# Patient Record
Sex: Male | Born: 1955
Health system: Southern US, Community
[De-identification: ages and names within clinical notes are randomized; demographics above are authoritative.]

## PROBLEM LIST (undated history)

## (undated) DIAGNOSIS — Z8719 Personal history of other diseases of the digestive system: Secondary | ICD-10-CM

## (undated) DIAGNOSIS — F1021 Alcohol dependence, in remission: Secondary | ICD-10-CM

## (undated) DIAGNOSIS — I442 Atrioventricular block, complete: Secondary | ICD-10-CM

## (undated) DIAGNOSIS — I2510 Atherosclerotic heart disease of native coronary artery without angina pectoris: Secondary | ICD-10-CM

## (undated) DIAGNOSIS — E119 Type 2 diabetes mellitus without complications: Secondary | ICD-10-CM

## (undated) DIAGNOSIS — E1140 Type 2 diabetes mellitus with diabetic neuropathy, unspecified: Secondary | ICD-10-CM

## (undated) DIAGNOSIS — Z95810 Presence of automatic (implantable) cardiac defibrillator: Secondary | ICD-10-CM

## (undated) DIAGNOSIS — M1990 Unspecified osteoarthritis, unspecified site: Secondary | ICD-10-CM

## (undated) DIAGNOSIS — G5600 Carpal tunnel syndrome, unspecified upper limb: Secondary | ICD-10-CM

## (undated) DIAGNOSIS — I429 Cardiomyopathy, unspecified: Secondary | ICD-10-CM

## (undated) DIAGNOSIS — I219 Acute myocardial infarction, unspecified: Secondary | ICD-10-CM

## (undated) DIAGNOSIS — I10 Essential (primary) hypertension: Secondary | ICD-10-CM

## (undated) DIAGNOSIS — I480 Paroxysmal atrial fibrillation: Secondary | ICD-10-CM

## (undated) DIAGNOSIS — I5022 Chronic systolic (congestive) heart failure: Secondary | ICD-10-CM

## (undated) DIAGNOSIS — Z8701 Personal history of pneumonia (recurrent): Secondary | ICD-10-CM

## (undated) DIAGNOSIS — E785 Hyperlipidemia, unspecified: Secondary | ICD-10-CM

## (undated) DIAGNOSIS — C189 Malignant neoplasm of colon, unspecified: Secondary | ICD-10-CM

## (undated) DIAGNOSIS — J449 Chronic obstructive pulmonary disease, unspecified: Secondary | ICD-10-CM

## (undated) HISTORY — DX: Atrioventricular block, complete: I44.2

## (undated) HISTORY — DX: Hyperlipidemia, unspecified: E78.5

## (undated) HISTORY — DX: Carpal tunnel syndrome, unspecified upper limb: G56.00

## (undated) HISTORY — PX: ANKLE SURGERY: SHX546

## (undated) HISTORY — DX: Type 2 diabetes mellitus without complications: E11.9

## (undated) HISTORY — DX: Chronic systolic (congestive) heart failure: I50.22

## (undated) HISTORY — PX: CORONARY ANGIOPLASTY WITH STENT PLACEMENT: SHX49

## (undated) HISTORY — DX: Alcohol dependence, in remission: F10.21

## (undated) HISTORY — PX: INGUINAL HERNIA REPAIR: SHX194

## (undated) HISTORY — DX: Malignant neoplasm of colon, unspecified: C18.9

---

## 2001-11-29 ENCOUNTER — Encounter: Payer: Self-pay | Admitting: General Surgery

## 2001-11-30 ENCOUNTER — Inpatient Hospital Stay (HOSPITAL_COMMUNITY): Admission: RE | Admit: 2001-11-30 | Discharge: 2001-12-02 | Payer: Self-pay | Admitting: General Surgery

## 2004-10-04 DIAGNOSIS — C189 Malignant neoplasm of colon, unspecified: Secondary | ICD-10-CM

## 2004-10-04 HISTORY — DX: Malignant neoplasm of colon, unspecified: C18.9

## 2005-02-10 ENCOUNTER — Encounter: Payer: Self-pay | Admitting: Emergency Medicine

## 2005-02-10 ENCOUNTER — Ambulatory Visit: Payer: Self-pay | Admitting: Cardiology

## 2005-02-10 ENCOUNTER — Inpatient Hospital Stay (HOSPITAL_COMMUNITY): Admission: AD | Admit: 2005-02-10 | Discharge: 2005-02-23 | Payer: Self-pay | Admitting: Cardiology

## 2005-02-11 ENCOUNTER — Ambulatory Visit: Payer: Self-pay | Admitting: Oncology

## 2005-02-11 ENCOUNTER — Encounter: Payer: Self-pay | Admitting: Cardiology

## 2005-02-25 ENCOUNTER — Ambulatory Visit: Payer: Self-pay | Admitting: *Deleted

## 2005-02-26 ENCOUNTER — Ambulatory Visit: Payer: Self-pay | Admitting: *Deleted

## 2005-03-02 ENCOUNTER — Ambulatory Visit: Payer: Self-pay | Admitting: *Deleted

## 2005-03-03 ENCOUNTER — Ambulatory Visit: Payer: Self-pay | Admitting: *Deleted

## 2005-03-11 ENCOUNTER — Ambulatory Visit: Payer: Self-pay | Admitting: *Deleted

## 2005-03-30 ENCOUNTER — Encounter: Admission: RE | Admit: 2005-03-30 | Discharge: 2005-03-30 | Payer: Self-pay | Admitting: Oncology

## 2005-03-30 ENCOUNTER — Ambulatory Visit (HOSPITAL_COMMUNITY): Payer: Self-pay | Admitting: Oncology

## 2005-03-30 ENCOUNTER — Encounter (HOSPITAL_COMMUNITY): Admission: RE | Admit: 2005-03-30 | Discharge: 2005-04-29 | Payer: Self-pay | Admitting: Oncology

## 2005-04-16 ENCOUNTER — Ambulatory Visit: Payer: Self-pay | Admitting: Cardiology

## 2005-04-16 ENCOUNTER — Ambulatory Visit (HOSPITAL_COMMUNITY): Admission: RE | Admit: 2005-04-16 | Discharge: 2005-04-16 | Payer: Self-pay | Admitting: Pulmonary Disease

## 2005-04-21 ENCOUNTER — Ambulatory Visit: Payer: Self-pay | Admitting: Physician Assistant

## 2005-04-22 ENCOUNTER — Ambulatory Visit: Payer: Self-pay | Admitting: Cardiology

## 2005-04-22 ENCOUNTER — Ambulatory Visit (HOSPITAL_COMMUNITY): Admission: RE | Admit: 2005-04-22 | Discharge: 2005-04-22 | Payer: Self-pay | Admitting: Pulmonary Disease

## 2005-04-27 ENCOUNTER — Ambulatory Visit: Payer: Self-pay | Admitting: *Deleted

## 2005-05-04 ENCOUNTER — Ambulatory Visit: Payer: Self-pay | Admitting: *Deleted

## 2005-05-05 ENCOUNTER — Encounter (HOSPITAL_COMMUNITY): Admission: RE | Admit: 2005-05-05 | Discharge: 2005-06-04 | Payer: Self-pay | Admitting: *Deleted

## 2005-05-19 ENCOUNTER — Ambulatory Visit: Payer: Self-pay | Admitting: *Deleted

## 2005-06-01 ENCOUNTER — Ambulatory Visit (HOSPITAL_COMMUNITY): Payer: Self-pay | Admitting: Oncology

## 2005-06-01 ENCOUNTER — Encounter (HOSPITAL_COMMUNITY): Admission: RE | Admit: 2005-06-01 | Discharge: 2005-07-01 | Payer: Self-pay | Admitting: Oncology

## 2005-06-01 ENCOUNTER — Encounter: Admission: RE | Admit: 2005-06-01 | Discharge: 2005-06-01 | Payer: Self-pay | Admitting: Oncology

## 2005-06-09 ENCOUNTER — Encounter (HOSPITAL_COMMUNITY): Admission: RE | Admit: 2005-06-09 | Discharge: 2005-07-02 | Payer: Self-pay | Admitting: *Deleted

## 2005-06-14 ENCOUNTER — Ambulatory Visit (HOSPITAL_COMMUNITY): Admission: RE | Admit: 2005-06-14 | Discharge: 2005-06-14 | Payer: Self-pay | Admitting: Pulmonary Disease

## 2005-06-16 ENCOUNTER — Ambulatory Visit: Payer: Self-pay | Admitting: *Deleted

## 2005-07-14 ENCOUNTER — Ambulatory Visit: Payer: Self-pay | Admitting: *Deleted

## 2005-07-27 ENCOUNTER — Ambulatory Visit (HOSPITAL_COMMUNITY): Payer: Self-pay | Admitting: Oncology

## 2005-07-27 ENCOUNTER — Encounter (HOSPITAL_COMMUNITY): Admission: RE | Admit: 2005-07-27 | Discharge: 2005-08-26 | Payer: Self-pay | Admitting: Oncology

## 2005-07-27 ENCOUNTER — Encounter: Admission: RE | Admit: 2005-07-27 | Discharge: 2005-07-27 | Payer: Self-pay | Admitting: Oncology

## 2005-08-14 ENCOUNTER — Ambulatory Visit: Payer: Self-pay | Admitting: Internal Medicine

## 2005-08-17 ENCOUNTER — Ambulatory Visit (HOSPITAL_COMMUNITY): Admission: RE | Admit: 2005-08-17 | Discharge: 2005-08-17 | Payer: Self-pay | Admitting: Internal Medicine

## 2005-08-17 ENCOUNTER — Encounter: Payer: Self-pay | Admitting: Internal Medicine

## 2005-08-17 HISTORY — PX: COLONOSCOPY: SHX174

## 2005-08-19 ENCOUNTER — Ambulatory Visit: Payer: Self-pay | Admitting: *Deleted

## 2005-09-01 ENCOUNTER — Encounter (INDEPENDENT_AMBULATORY_CARE_PROVIDER_SITE_OTHER): Payer: Self-pay | Admitting: General Surgery

## 2005-09-01 ENCOUNTER — Inpatient Hospital Stay (HOSPITAL_COMMUNITY): Admission: RE | Admit: 2005-09-01 | Discharge: 2005-09-05 | Payer: Self-pay | Admitting: General Surgery

## 2005-09-01 HISTORY — PX: SUBTOTAL COLECTOMY: SHX855

## 2005-09-02 ENCOUNTER — Ambulatory Visit: Payer: Self-pay | Admitting: *Deleted

## 2005-09-14 ENCOUNTER — Encounter (HOSPITAL_COMMUNITY): Admission: RE | Admit: 2005-09-14 | Discharge: 2005-09-14 | Payer: Self-pay | Admitting: Oncology

## 2005-09-14 ENCOUNTER — Encounter: Admission: RE | Admit: 2005-09-14 | Discharge: 2005-09-14 | Payer: Self-pay | Admitting: Oncology

## 2005-09-14 ENCOUNTER — Ambulatory Visit (HOSPITAL_COMMUNITY): Payer: Self-pay | Admitting: Oncology

## 2005-09-17 ENCOUNTER — Ambulatory Visit: Payer: Self-pay | Admitting: *Deleted

## 2005-11-09 ENCOUNTER — Encounter (HOSPITAL_COMMUNITY): Admission: RE | Admit: 2005-11-09 | Discharge: 2005-12-09 | Payer: Self-pay | Admitting: Oncology

## 2005-11-09 ENCOUNTER — Encounter: Admission: RE | Admit: 2005-11-09 | Discharge: 2005-11-09 | Payer: Self-pay | Admitting: Oncology

## 2005-11-15 ENCOUNTER — Ambulatory Visit: Payer: Self-pay | Admitting: *Deleted

## 2005-11-16 ENCOUNTER — Ambulatory Visit (HOSPITAL_COMMUNITY): Admission: RE | Admit: 2005-11-16 | Discharge: 2005-11-16 | Payer: Self-pay | Admitting: *Deleted

## 2005-11-16 ENCOUNTER — Ambulatory Visit: Payer: Self-pay | Admitting: *Deleted

## 2005-11-23 ENCOUNTER — Ambulatory Visit: Payer: Self-pay | Admitting: *Deleted

## 2005-11-24 ENCOUNTER — Ambulatory Visit (HOSPITAL_COMMUNITY): Payer: Self-pay | Admitting: Oncology

## 2005-11-30 ENCOUNTER — Ambulatory Visit (HOSPITAL_COMMUNITY): Admission: RE | Admit: 2005-11-30 | Discharge: 2005-12-01 | Payer: Self-pay | Admitting: Cardiology

## 2005-11-30 ENCOUNTER — Ambulatory Visit: Payer: Self-pay | Admitting: Cardiology

## 2005-12-16 ENCOUNTER — Ambulatory Visit: Payer: Self-pay | Admitting: Cardiology

## 2005-12-21 ENCOUNTER — Encounter: Admission: RE | Admit: 2005-12-21 | Discharge: 2005-12-21 | Payer: Self-pay | Admitting: Oncology

## 2005-12-21 ENCOUNTER — Encounter (HOSPITAL_COMMUNITY): Admission: RE | Admit: 2005-12-21 | Discharge: 2006-01-20 | Payer: Self-pay | Admitting: Oncology

## 2006-01-18 ENCOUNTER — Ambulatory Visit (HOSPITAL_COMMUNITY): Payer: Self-pay | Admitting: Oncology

## 2006-02-01 ENCOUNTER — Ambulatory Visit: Payer: Self-pay | Admitting: *Deleted

## 2006-04-08 ENCOUNTER — Encounter: Admission: RE | Admit: 2006-04-08 | Discharge: 2006-04-08 | Payer: Self-pay | Admitting: Oncology

## 2006-04-08 ENCOUNTER — Ambulatory Visit (HOSPITAL_COMMUNITY): Payer: Self-pay | Admitting: Oncology

## 2006-04-08 ENCOUNTER — Encounter (HOSPITAL_COMMUNITY): Admission: RE | Admit: 2006-04-08 | Discharge: 2006-05-08 | Payer: Self-pay | Admitting: Oncology

## 2006-05-05 ENCOUNTER — Ambulatory Visit: Payer: Self-pay | Admitting: *Deleted

## 2006-05-13 ENCOUNTER — Emergency Department (HOSPITAL_COMMUNITY): Admission: EM | Admit: 2006-05-13 | Discharge: 2006-05-13 | Payer: Self-pay | Admitting: Emergency Medicine

## 2006-05-31 ENCOUNTER — Ambulatory Visit: Payer: Self-pay | Admitting: *Deleted

## 2006-06-07 ENCOUNTER — Ambulatory Visit: Payer: Self-pay | Admitting: Cardiology

## 2006-06-07 ENCOUNTER — Encounter (HOSPITAL_COMMUNITY): Admission: RE | Admit: 2006-06-07 | Discharge: 2006-07-02 | Payer: Self-pay | Admitting: *Deleted

## 2006-06-13 ENCOUNTER — Ambulatory Visit: Payer: Self-pay | Admitting: Internal Medicine

## 2006-06-13 ENCOUNTER — Encounter (INDEPENDENT_AMBULATORY_CARE_PROVIDER_SITE_OTHER): Payer: Self-pay | Admitting: *Deleted

## 2006-06-13 ENCOUNTER — Ambulatory Visit (HOSPITAL_COMMUNITY): Admission: RE | Admit: 2006-06-13 | Discharge: 2006-06-13 | Payer: Self-pay | Admitting: Internal Medicine

## 2006-06-13 HISTORY — PX: FLEXIBLE SIGMOIDOSCOPY: SHX1649

## 2006-06-15 ENCOUNTER — Ambulatory Visit: Payer: Self-pay | Admitting: Cardiology

## 2006-07-15 ENCOUNTER — Encounter: Admission: RE | Admit: 2006-07-15 | Discharge: 2006-07-15 | Payer: Self-pay | Admitting: Oncology

## 2006-07-15 ENCOUNTER — Encounter (HOSPITAL_COMMUNITY): Admission: RE | Admit: 2006-07-15 | Discharge: 2006-08-14 | Payer: Self-pay | Admitting: Oncology

## 2006-07-15 ENCOUNTER — Ambulatory Visit (HOSPITAL_COMMUNITY): Payer: Self-pay | Admitting: Oncology

## 2006-08-02 ENCOUNTER — Encounter: Admission: EM | Admit: 2006-08-02 | Discharge: 2006-08-02 | Payer: Self-pay | Admitting: Dentistry

## 2006-08-02 ENCOUNTER — Ambulatory Visit: Payer: Self-pay | Admitting: Dentistry

## 2006-08-19 ENCOUNTER — Ambulatory Visit: Payer: Self-pay | Admitting: Cardiology

## 2006-09-12 ENCOUNTER — Ambulatory Visit: Payer: Self-pay | Admitting: Internal Medicine

## 2006-10-11 ENCOUNTER — Ambulatory Visit (HOSPITAL_COMMUNITY): Payer: Self-pay | Admitting: Oncology

## 2006-10-11 ENCOUNTER — Encounter (HOSPITAL_COMMUNITY): Admission: RE | Admit: 2006-10-11 | Discharge: 2006-11-10 | Payer: Self-pay | Admitting: Oncology

## 2007-03-13 ENCOUNTER — Ambulatory Visit (HOSPITAL_COMMUNITY): Payer: Self-pay | Admitting: Oncology

## 2007-03-13 ENCOUNTER — Encounter (HOSPITAL_COMMUNITY): Admission: RE | Admit: 2007-03-13 | Discharge: 2007-04-12 | Payer: Self-pay | Admitting: Oncology

## 2007-11-10 ENCOUNTER — Ambulatory Visit (HOSPITAL_COMMUNITY): Admission: RE | Admit: 2007-11-10 | Discharge: 2007-11-10 | Payer: Self-pay | Admitting: Pulmonary Disease

## 2007-11-17 ENCOUNTER — Ambulatory Visit: Payer: Self-pay | Admitting: Cardiology

## 2007-11-17 ENCOUNTER — Ambulatory Visit (HOSPITAL_COMMUNITY): Admission: RE | Admit: 2007-11-17 | Discharge: 2007-11-17 | Payer: Self-pay | Admitting: Pulmonary Disease

## 2008-11-09 ENCOUNTER — Emergency Department (HOSPITAL_COMMUNITY): Admission: EM | Admit: 2008-11-09 | Discharge: 2008-11-09 | Payer: Self-pay | Admitting: Emergency Medicine

## 2008-11-11 ENCOUNTER — Ambulatory Visit: Payer: Self-pay | Admitting: Orthopedic Surgery

## 2008-11-11 DIAGNOSIS — S52539A Colles' fracture of unspecified radius, initial encounter for closed fracture: Secondary | ICD-10-CM | POA: Insufficient documentation

## 2008-12-23 ENCOUNTER — Ambulatory Visit: Payer: Self-pay | Admitting: Orthopedic Surgery

## 2009-01-23 ENCOUNTER — Inpatient Hospital Stay (HOSPITAL_COMMUNITY): Admission: EM | Admit: 2009-01-23 | Discharge: 2009-01-25 | Payer: Self-pay | Admitting: Emergency Medicine

## 2010-03-26 ENCOUNTER — Inpatient Hospital Stay (HOSPITAL_COMMUNITY): Admission: EM | Admit: 2010-03-26 | Discharge: 2010-03-31 | Payer: Self-pay | Admitting: Emergency Medicine

## 2010-03-26 ENCOUNTER — Ambulatory Visit: Payer: Self-pay | Admitting: Pulmonary Disease

## 2010-03-26 ENCOUNTER — Encounter: Payer: Self-pay | Admitting: Emergency Medicine

## 2010-03-28 ENCOUNTER — Encounter (INDEPENDENT_AMBULATORY_CARE_PROVIDER_SITE_OTHER): Payer: Self-pay | Admitting: Cardiology

## 2010-11-26 ENCOUNTER — Emergency Department (HOSPITAL_COMMUNITY)
Admission: EM | Admit: 2010-11-26 | Discharge: 2010-11-26 | Disposition: A | Discharge status: Home / Self Care | Payer: Medicare Other | Attending: Emergency Medicine | Admitting: Emergency Medicine

## 2010-11-26 DIAGNOSIS — L03119 Cellulitis of unspecified part of limb: Secondary | ICD-10-CM | POA: Insufficient documentation

## 2010-11-26 DIAGNOSIS — I2510 Atherosclerotic heart disease of native coronary artery without angina pectoris: Secondary | ICD-10-CM | POA: Insufficient documentation

## 2010-11-26 DIAGNOSIS — I10 Essential (primary) hypertension: Secondary | ICD-10-CM | POA: Insufficient documentation

## 2010-11-26 DIAGNOSIS — E119 Type 2 diabetes mellitus without complications: Secondary | ICD-10-CM | POA: Insufficient documentation

## 2010-11-26 DIAGNOSIS — J4489 Other specified chronic obstructive pulmonary disease: Secondary | ICD-10-CM | POA: Insufficient documentation

## 2010-11-26 DIAGNOSIS — J449 Chronic obstructive pulmonary disease, unspecified: Secondary | ICD-10-CM | POA: Insufficient documentation

## 2010-11-26 DIAGNOSIS — L02419 Cutaneous abscess of limb, unspecified: Secondary | ICD-10-CM | POA: Insufficient documentation

## 2010-11-26 DIAGNOSIS — Z79899 Other long term (current) drug therapy: Secondary | ICD-10-CM | POA: Insufficient documentation

## 2010-11-26 DIAGNOSIS — Z794 Long term (current) use of insulin: Secondary | ICD-10-CM | POA: Insufficient documentation

## 2010-11-26 DIAGNOSIS — M79609 Pain in unspecified limb: Secondary | ICD-10-CM | POA: Insufficient documentation

## 2010-11-26 LAB — BASIC METABOLIC PANEL WITH GFR
BUN: 21 mg/dL (ref 6–23)
CO2: 26 meq/L (ref 19–32)
Calcium: 8.9 mg/dL (ref 8.4–10.5)
Chloride: 95 meq/L — ABNORMAL LOW (ref 96–112)
Creatinine, Ser: 1.4 mg/dL (ref 0.4–1.5)
GFR calc Af Amer: >60 mL/min (ref >60)
GFR calc non Af Amer: 53 mL/min — ABNORMAL LOW (ref >60)
Glucose, Bld: 174 mg/dL — ABNORMAL HIGH (ref 70–99)
Potassium: 4.6 meq/L (ref 3.5–5.1)
Sodium: 131 meq/L — ABNORMAL LOW (ref 135–145)

## 2010-11-26 LAB — CBC
MCH: 28.9 pg (ref 26.0–34.0)
MCV: 86.7 fL (ref 78.0–100.0)
RDW: 13.6 % (ref 11.5–15.5)
WBC: 15.9 10*3/uL — ABNORMAL HIGH (ref 4.0–10.5)

## 2010-11-26 LAB — DIFFERENTIAL
Eosinophils Relative: 1 % (ref 0–5)
Monocytes Relative: 8 % (ref 3–12)
Neutro Abs: 12.9 10*3/uL — ABNORMAL HIGH (ref 1.7–7.7)

## 2010-12-20 LAB — BASIC METABOLIC PANEL
BUN: 28 mg/dL — ABNORMAL HIGH (ref 6–23)
BUN: 5 mg/dL — ABNORMAL LOW (ref 6–23)
BUN: 5 mg/dL — ABNORMAL LOW (ref 6–23)
CO2: 26 mEq/L (ref 19–32)
Calcium: 8.5 mg/dL (ref 8.4–10.5)
Calcium: 9 mg/dL (ref 8.4–10.5)
Chloride: 103 mEq/L (ref 96–112)
Chloride: 108 mEq/L (ref 96–112)
Chloride: 113 mEq/L — ABNORMAL HIGH (ref 96–112)
Creatinine, Ser: 0.75 mg/dL (ref 0.4–1.5)
Creatinine, Ser: 1.37 mg/dL (ref 0.4–1.5)
GFR calc Af Amer: >60 mL/min (ref >60)
GFR calc Af Amer: >60 mL/min (ref >60)
GFR calc Af Amer: >60 mL/min (ref >60)
GFR calc Af Amer: >60 mL/min (ref >60)
GFR calc non Af Amer: 54 mL/min — ABNORMAL LOW (ref >60)
GFR calc non Af Amer: >60 mL/min (ref >60)
GFR calc non Af Amer: >60 mL/min (ref >60)
GFR calc non Af Amer: >60 mL/min (ref >60)
GFR calc non Af Amer: >60 mL/min (ref >60)
Glucose, Bld: 123 mg/dL — ABNORMAL HIGH (ref 70–99)
Glucose, Bld: 147 mg/dL — ABNORMAL HIGH (ref 70–99)
Potassium: 3.7 mEq/L (ref 3.5–5.1)
Potassium: 3.8 mEq/L (ref 3.5–5.1)
Potassium: 3.9 mEq/L (ref 3.5–5.1)
Sodium: 136 mEq/L (ref 135–145)
Sodium: 140 mEq/L (ref 135–145)
Sodium: 142 mEq/L (ref 135–145)
Sodium: 145 mEq/L (ref 135–145)

## 2010-12-20 LAB — GLUCOSE, CAPILLARY
Glucose-Capillary: 326 mg/dL — ABNORMAL HIGH (ref 70–99)
Glucose-Capillary: 109 mg/dL — ABNORMAL HIGH (ref 70–99)
Glucose-Capillary: 110 mg/dL — ABNORMAL HIGH (ref 70–99)
Glucose-Capillary: 115 mg/dL — ABNORMAL HIGH (ref 70–99)
Glucose-Capillary: 125 mg/dL — ABNORMAL HIGH (ref 70–99)
Glucose-Capillary: 142 mg/dL — ABNORMAL HIGH (ref 70–99)
Glucose-Capillary: 152 mg/dL — ABNORMAL HIGH (ref 70–99)
Glucose-Capillary: 206 mg/dL — ABNORMAL HIGH (ref 70–99)
Glucose-Capillary: 219 mg/dL — ABNORMAL HIGH (ref 70–99)
Glucose-Capillary: 220 mg/dL — ABNORMAL HIGH (ref 70–99)
Glucose-Capillary: 251 mg/dL — ABNORMAL HIGH (ref 70–99)
Glucose-Capillary: 272 mg/dL — ABNORMAL HIGH (ref 70–99)
Glucose-Capillary: 300 mg/dL — ABNORMAL HIGH (ref 70–99)

## 2010-12-20 LAB — COMPREHENSIVE METABOLIC PANEL
ALT: 25 U/L (ref 0–53)
ALT: 21 U/L (ref 0–53)
ALT: 27 U/L (ref 0–53)
AST: 39 U/L — ABNORMAL HIGH (ref 0–37)
AST: 18 U/L (ref 0–37)
AST: 29 U/L (ref 0–37)
Albumin: 2.5 g/dL — ABNORMAL LOW (ref 3.5–5.2)
Alkaline Phosphatase: 88 U/L (ref 39–117)
Alkaline Phosphatase: 67 U/L (ref 39–117)
BUN: 32 mg/dL — ABNORMAL HIGH (ref 6–23)
CO2: 23 mEq/L (ref 19–32)
CO2: 25 mEq/L (ref 19–32)
Calcium: 8.6 mg/dL (ref 8.4–10.5)
Calcium: 8.9 mg/dL (ref 8.4–10.5)
Chloride: 108 mEq/L (ref 96–112)
Chloride: 108 mEq/L (ref 96–112)
Creatinine, Ser: 1.7 mg/dL — ABNORMAL HIGH (ref 0.4–1.5)
Creatinine, Ser: 0.97 mg/dL (ref 0.4–1.5)
GFR calc Af Amer: >60 mL/min (ref >60)
GFR calc Af Amer: >60 mL/min (ref >60)
GFR calc non Af Amer: 42 mL/min — ABNORMAL LOW (ref >60)
GFR calc non Af Amer: >60 mL/min (ref >60)
GFR calc non Af Amer: >60 mL/min (ref >60)
Glucose, Bld: 347 mg/dL — ABNORMAL HIGH (ref 70–99)
Potassium: 4.2 mEq/L (ref 3.5–5.1)
Sodium: 144 mEq/L (ref 135–145)
Total Bilirubin: 0.5 mg/dL (ref 0.3–1.2)
Total Protein: 6.1 g/dL (ref 6.0–8.3)
Total Protein: 5.3 g/dL — ABNORMAL LOW (ref 6.0–8.3)

## 2010-12-20 LAB — CARDIAC PANEL(CRET KIN+CKTOT+MB+TROPI)
CK, MB: 3.5 ng/mL (ref 0.3–4.0)
Relative Index: INVALID (ref 0.0–2.5)
Relative Index: INVALID (ref 0.0–2.5)
Total CK: 65 U/L (ref 7–232)
Troponin I: 0.01 ng/mL (ref 0.00–0.06)
Troponin I: 0.02 ng/mL (ref 0.00–0.06)

## 2010-12-20 LAB — POCT I-STAT, CHEM 8
BUN: 35 mg/dL — ABNORMAL HIGH (ref 6–23)
BUN: 37 mg/dL — ABNORMAL HIGH (ref 6–23)
Calcium, Ion: 1.27 mmol/L (ref 1.12–1.32)
Chloride: 115 mEq/L — ABNORMAL HIGH (ref 96–112)
Chloride: 115 mEq/L — ABNORMAL HIGH (ref 96–112)
Creatinine, Ser: 1.7 mg/dL — ABNORMAL HIGH (ref 0.4–1.5)
Glucose, Bld: 351 mg/dL — ABNORMAL HIGH (ref 70–99)
HCT: 36 % — ABNORMAL LOW (ref 39.0–52.0)
HCT: 37 % — ABNORMAL LOW (ref 39.0–52.0)
Sodium: 139 mEq/L (ref 135–145)

## 2010-12-20 LAB — CBC
HCT: 28 % — ABNORMAL LOW (ref 39.0–52.0)
HCT: 28.2 % — ABNORMAL LOW (ref 39.0–52.0)
HCT: 29.3 % — ABNORMAL LOW (ref 39.0–52.0)
Hemoglobin: 11.6 g/dL — ABNORMAL LOW (ref 13.0–17.0)
Hemoglobin: 10 g/dL — ABNORMAL LOW (ref 13.0–17.0)
Hemoglobin: 11.6 g/dL — ABNORMAL LOW (ref 13.0–17.0)
Hemoglobin: 9.5 g/dL — ABNORMAL LOW (ref 13.0–17.0)
Hemoglobin: 9.7 g/dL — ABNORMAL LOW (ref 13.0–17.0)
MCH: 31.6 pg (ref 26.0–34.0)
MCH: 31.8 pg (ref 26.0–34.0)
MCHC: 34 g/dL (ref 30.0–36.0)
MCV: 92.4 fL (ref 78.0–100.0)
MCV: 93.4 fL (ref 78.0–100.0)
Platelets: 116 10*3/uL — ABNORMAL LOW (ref 150–400)
Platelets: 97 10*3/uL — ABNORMAL LOW (ref 150–400)
RBC: 3.67 MIL/uL — ABNORMAL LOW (ref 4.22–5.81)
RBC: 3.06 MIL/uL — ABNORMAL LOW (ref 4.22–5.81)
RBC: 3.59 MIL/uL — ABNORMAL LOW (ref 4.22–5.81)
RBC: 3.63 MIL/uL — ABNORMAL LOW (ref 4.22–5.81)
RDW: 13 % (ref 11.5–15.5)
RDW: 13.9 % (ref 11.5–15.5)
WBC: 10.5 10*3/uL (ref 4.0–10.5)
WBC: 11.4 10*3/uL — ABNORMAL HIGH (ref 4.0–10.5)
WBC: 5.4 10*3/uL (ref 4.0–10.5)
WBC: 8.4 10*3/uL (ref 4.0–10.5)
WBC: 9.1 10*3/uL (ref 4.0–10.5)

## 2010-12-20 LAB — POCT I-STAT 3, ART BLOOD GAS (G3+)
Acid-base deficit: 5 mmol/L — ABNORMAL HIGH (ref 0.0–2.0)
Bicarbonate: 19.3 mEq/L — ABNORMAL LOW (ref 20.0–24.0)
O2 Saturation: 99 %
TCO2: 21 mmol/L (ref 0–100)
pCO2 arterial: 58.4 mmHg (ref 35.0–45.0)
pH, Arterial: 7.128 — CL (ref 7.350–7.450)
pO2, Arterial: 114 mmHg — ABNORMAL HIGH (ref 80.0–100.0)
pO2, Arterial: 144 mmHg — ABNORMAL HIGH (ref 80.0–100.0)

## 2010-12-20 LAB — RAPID URINE DRUG SCREEN, HOSP PERFORMED
Amphetamines: NOT DETECTED
Barbiturates: NOT DETECTED
Benzodiazepines: NOT DETECTED
Opiates: POSITIVE — AB
Tetrahydrocannabinol: NOT DETECTED

## 2010-12-20 LAB — PROTIME-INR: INR: 0.98 (ref 0.00–1.49)

## 2010-12-20 LAB — URINALYSIS, ROUTINE W REFLEX MICROSCOPIC
Ketones, ur: NEGATIVE mg/dL
Nitrite: NEGATIVE
Specific Gravity, Urine: 1.024 (ref 1.005–1.030)
Urobilinogen, UA: 0.2 mg/dL (ref 0.0–1.0)
pH: 5 (ref 5.0–8.0)

## 2010-12-20 LAB — TROPONIN I: Troponin I: 0.03 ng/mL (ref 0.00–0.06)

## 2010-12-20 LAB — MAGNESIUM: Magnesium: 1.9 mg/dL (ref 1.5–2.5)

## 2010-12-20 LAB — BLOOD GAS, ARTERIAL
Acid-base deficit: 2.8 mmol/L — ABNORMAL HIGH (ref 0.0–2.0)
Acid-base deficit: 3 mmol/L — ABNORMAL HIGH (ref 0.0–2.0)
MECHVT: 620 mL
MECHVT: 620 mL
PEEP: 5 cmH2O
Patient temperature: 98.6
RATE: 14 resp/min
RATE: 14 resp/min
TCO2: 22.4 mmol/L (ref 0–100)
pCO2 arterial: 44.9 mmHg (ref 35.0–45.0)
pH, Arterial: 7.319 — ABNORMAL LOW (ref 7.350–7.450)
pH, Arterial: 7.386 (ref 7.350–7.450)
pO2, Arterial: 67.2 mmHg — ABNORMAL LOW (ref 80.0–100.0)

## 2010-12-20 LAB — CULTURE, BLOOD (ROUTINE X 2)

## 2010-12-20 LAB — URINE MICROSCOPIC-ADD ON

## 2010-12-20 LAB — D-DIMER, QUANTITATIVE: D-Dimer, Quant: 0.44 ug/mL-FEU (ref 0.00–0.48)

## 2010-12-20 LAB — DIFFERENTIAL
Basophils Absolute: 0.1 10*3/uL (ref 0.0–0.1)
Eosinophils Relative: 3 % (ref 0–5)
Lymphocytes Relative: 30 % (ref 12–46)
Neutrophils Relative %: 61 % (ref 43–77)

## 2010-12-20 LAB — POCT CARDIAC MARKERS
CKMB, poc: 4 ng/mL (ref 1.0–8.0)
Myoglobin, poc: 159 ng/mL (ref 12–200)
Troponin i, poc: <0.05 ng/mL (ref 0.00–0.09)
Troponin i, poc: <0.05 ng/mL (ref 0.00–0.09)

## 2010-12-20 LAB — LACTIC ACID, PLASMA: Lactic Acid, Venous: 1.1 mmol/L (ref 0.5–2.2)

## 2010-12-20 LAB — CK TOTAL AND CKMB (NOT AT ARMC)
CK, MB: 4.4 ng/mL — ABNORMAL HIGH (ref 0.3–4.0)
Relative Index: INVALID (ref 0.0–2.5)
Relative Index: INVALID (ref 0.0–2.5)
Total CK: 90 U/L (ref 7–232)

## 2010-12-20 LAB — CULTURE, RESPIRATORY W GRAM STAIN

## 2010-12-20 LAB — MRSA PCR SCREENING: MRSA by PCR: NEGATIVE

## 2010-12-20 LAB — APTT: aPTT: 28 seconds (ref 24–37)

## 2010-12-20 LAB — BRAIN NATRIURETIC PEPTIDE: Pro B Natriuretic peptide (BNP): 218 pg/mL — ABNORMAL HIGH (ref 0.0–100.0)

## 2010-12-20 LAB — PHOSPHORUS: Phosphorus: 3 mg/dL (ref 2.3–4.6)

## 2011-01-13 LAB — GLUCOSE, CAPILLARY
Glucose-Capillary: 334 mg/dL — ABNORMAL HIGH (ref 70–99)
Glucose-Capillary: 355 mg/dL — ABNORMAL HIGH (ref 70–99)
Glucose-Capillary: 427 mg/dL — ABNORMAL HIGH (ref 70–99)
Glucose-Capillary: 505 mg/dL (ref 70–99)

## 2011-01-13 LAB — CULTURE, BLOOD (ROUTINE X 2)
Culture: NO GROWTH
Report Status: 4272010
Report Status: 4272010

## 2011-01-13 LAB — BLOOD GAS, ARTERIAL
Bicarbonate: 24.9 mEq/L — ABNORMAL HIGH (ref 20.0–24.0)
TCO2: 23 mmol/L (ref 0–100)
pCO2 arterial: 42.5 mmHg (ref 35.0–45.0)
pH, Arterial: 7.386 (ref 7.350–7.450)

## 2011-01-13 LAB — BASIC METABOLIC PANEL
GFR calc Af Amer: >60 mL/min (ref >60)
GFR calc non Af Amer: >60 mL/min (ref >60)
Glucose, Bld: 209 mg/dL — ABNORMAL HIGH (ref 70–99)
Potassium: 4.5 mEq/L (ref 3.5–5.1)
Sodium: 137 mEq/L (ref 135–145)

## 2011-01-13 LAB — DIFFERENTIAL
Eosinophils Relative: 2 % (ref 0–5)
Lymphocytes Relative: 8 % — ABNORMAL LOW (ref 12–46)
Lymphs Abs: 0.5 10*3/uL — ABNORMAL LOW (ref 0.7–4.0)
Monocytes Absolute: 0.4 10*3/uL (ref 0.1–1.0)
Monocytes Relative: 6 % (ref 3–12)

## 2011-01-13 LAB — CBC
HCT: 32.7 % — ABNORMAL LOW (ref 39.0–52.0)
Hemoglobin: 11 g/dL — ABNORMAL LOW (ref 13.0–17.0)
RBC: 3.57 MIL/uL — ABNORMAL LOW (ref 4.22–5.81)
WBC: 6.6 10*3/uL (ref 4.0–10.5)

## 2011-02-16 NOTE — H&P (Signed)
[name], [name]                ACCOUNT NO.:  [id number]   MEDICAL RECORD NO.:  [id number]          PATIENT TYPE:  INP   LOCATION:  A311                          FACILITY:  [facility]   PHYSICIAN:  [name]. Juanetta Gosling, M.D.DATE OF BIRTH:  12/07/1955   DATE OF ADMISSION:  DATE OF DISCHARGE:  LH                              HISTORY & PHYSICAL   [name] is a 55 year old who came to the emergency because of  shortness of breath.  He states his mother was just discharged from the  hospital after having had pneumonia.  He developed cough, congestion,  fever, sore throat, and because they were similar symptoms what his  mother had he was worried that he did develop pneumonia himself.  He  came to the emergency room when he was noted to have changes on his  chest x-ray which did suggest that he very well could have pneumonia,  but he also had severe COPD and chronic interstitial changes as well.   PAST MEDICAL HISTORY:  He has a complicated past medical history that  includes,  1. Coronary artery occlusive disease.  2. Hypertension.  3. Diabetes for which he takes insulin.  4. Gastroesophageal reflux.  5. COPD.  6. Depression.  7. Hyperlipidemia.  8. He also has a history of colon cancer.   I have not seen him in my office in sometime.   MEDICATIONS:  1. Lasix 40 mg b.i.d.  2. Cholestyramine 1 g 3 times a day.  3. Gabapentin 300 mg 3 times a day.  4. Penicillin 500 mg 4 times a day that he is taking for his teeth.  5. Albuterol and Atrovent nebulizer treatment 4 times a day as needed.  6. He is also on a Atrovent inhaler, he got it 4 times a day.  7. Hydrocodone 5/500 as needed for pain.  8. Lantus 10 units every evening.  9. Simvastatin 40 mg at bedtime.  10.Niacin 1000 mg at bedtime.  11.Nitroglycerin 0.4 as needed.  12.Xopenex HFA 2 puffs every 8 hours as needed.  13.Multiple vitamin daily.  14.Aspirin 325 mg daily.  15.Plavix 75 mg daily.  16.Trilipix 135 mg daily.  17.Isosorbide mononitrate 45 mg daily.  18.Enalapril 10 mg daily.  19.Spironolactone 12.5 mg daily.  20.Cymbalta 30 mg b.i.d.  21.Carvedilol 25 mg b.i.d.  22.Omeprazole 20 mg b.i.d.  23.Metformin 500 mg b.i.d.  24.Fish oil 2 tablets b.i.d.   ALLERGIES:  He is allergic to VANCOMYCIN.   SOCIAL HISTORY:  Lives at home with family.  He stopped smoking about 4-  5 years ago.  Stopped drinking alcohol about 4-5 years ago.  He is  disabled.   REVIEW OF SYSTEMS:  Except as mentioned is negative.   FAMILY HISTORY:  As mentioned is positive for a recent history of  pneumonia in his mother with a family of COPD and apparently diabetes.   PHYSICAL EXAMINATION:  GENERAL:  Thin male who has 2+ edema on both  legs.  He is sitting upright and looks pretty short of breath even at  rest.  He is using O2.  VITAL  SIGNS:  His temperature is 100 degrees, pulse 97, respirations 20,  blood pressure 131/77, O2 sats 92% on 4 L.  HEENT:  His pupils are reactive.  His mucous membranes are moist.  NECK:  Supple without masses.  He does not have any jugular venous  distention.  CHEST:  Rales bilaterally.  HEART:  Regular without gallop.  ABDOMEN:  Soft without masses.  EXTREMITIES:  Showed 2+ edema.   LABORATORY DATA:  Blood cultures were pending.  BNP is 874.  Blood gas  shows pO2 of 53, pCO2 of 42, and pH 7.38.  BMET is normal.  CBC shows  white count 6600, hemoglobin is 11, platelets 149.  Chest x-ray:  Cardiac enlargement, chronic bronchiolitic changes, increased  interstitial infiltrates which could of course indicate pneumonia.  He  had echocardiogram last in February 2009 and that showed ejection  fraction 25-30%.   ASSESSMENT:  He has changes suggestive of pneumonia.   Plan is for him to be on antibiotics, oxygen, inhaled bronchodilators.  He is only on sliding scale insulin.      [name]. Juanetta Gosling, M.D.  Electronically Signed     ELH/MEDQ  D:  01/23/2009  T:  01/24/2009  Job:   [id number]

## 2011-02-16 NOTE — Group Therapy Note (Signed)
NAMEWOODIE, DEGRAFFENREID                ACCOUNT NO.:  [id number]   MEDICAL RECORD NO.:  [id number]          PATIENT TYPE:  INP   LOCATION:  A311                          FACILITY:  [facility]   PHYSICIAN:  [name]. Juanetta Gosling, M.D.DATE OF BIRTH:  [birth year]   DATE OF PROCEDURE:  DATE OF DISCHARGE:                                 PROGRESS NOTE   Mr. Brass is doing well and says he feels 100% better.  He still had an  episode of what may have been PND last night, and he had to get a  respiratory treatment, but otherwise he is doing well.   His physical examination this morning shows a temperature of 97, pulse  85, respirations are 18, blood pressure 143/87, O2 sat 95% on 4 L.  His  INR was -800.  His chest is much clear than before.  His heart is  regular without gallop.  He still has some edema.   Assessment then is that he is much improved.   PLAN:  I am hopeful that he may be able to be discharged in the next 24-  48 hours.      [name]. Juanetta Gosling, M.D.  Electronically Signed     ELH/MEDQ  D:  01/24/2009  T:  01/25/2009  Job:  [id number]

## 2011-02-16 NOTE — Procedures (Signed)
NAMEJABE, [name]                ACCOUNT NO.:  [id number]   MEDICAL RECORD NO.:  [id number]          PATIENT TYPE:  OUT   LOCATION:  RAD                           FACILITY:  [facility]   PHYSICIAN:  Gerrit Friends. Dietrich Pates, MD, FACCDATE OF BIRTH:  [birth year]   DATE OF PROCEDURE:  11/17/2007  DATE OF DISCHARGE:  11/17/2007                                ECHOCARDIOGRAM   CLINICAL DATA:  A 55 year old gentleman with chest pain.   M-mode aorta 3.4, left atrium 4.3, septum 1.1, posterior wall 1.1, LV  diastole 5.9, LV systole 5.2.  1. Technically adequate echocardiographic study.  2. Normal right atrium and right ventricle; mild left atrial      enlargement.  3. Normal and trileaflet aortic valve; normal mitral valve with mild      regurgitation.  4. Normal tricuspid and pulmonic valve; normal proximal pulmonary      artery.  5. Mild left ventricular dilatation; normal wall thickness; severe      hypokinesis to akinesis of the anteroseptal and anterolateral      segments; apical akinesis.  Overall LV systolic function is      moderately to severely depressed with an estimated ejection      fraction of 0.25 - 0.30.  6. IVC dimensions at the upper limit of normal; decrease in diameter      with inspiration is blunted suggesting elevated right atrial      pressure.  7. Comparison with prior study of November 16, 2005:  No significant      interval change.      Gerrit Friends. Dietrich Pates, MD, [facility]  Electronically Signed     RMR/MEDQ  D:  11/18/2007  T:  11/20/2007  Job:  [id number]

## 2011-02-16 NOTE — Discharge Summary (Signed)
[name], AGNER                ACCOUNT NO.:  [id number]   MEDICAL RECORD NO.:  [id number]          PATIENT TYPE:  INP   LOCATION:  A311                          FACILITY:  [facility]   PHYSICIAN:  [name]. Juanetta Gosling, M.D.DATE OF BIRTH:  1956/08/02   DATE OF ADMISSION:  01/23/2009  DATE OF DISCHARGE:  04/24/2010LH                               DISCHARGE SUMMARY   FINAL DISCHARGE DIAGNOSES:  1. Pneumonia.  2. Chronic obstructive pulmonary disease.  3. Congestive heart failure.  4. Coronary artery occlusive disease.  5. Diabetes.  6. Gastroesophageal reflux disease.  7. Depression.  8. Hyperlipidemia.  9. History of colon cancer.  10.Diabetic neuropathy.  11.Poor dentition.  12.Chronic low back pain.   HISTORY:  [name] is a 55 year old who has a long known history of  multiple medical problems.  He had been come to the emergency room  because of shortness of breath.  He had cough, congestion, fever, sore  throat, and he was found to have what appeared to be pneumonia in the  emergency room.   PHYSICAL EXAMINATION:  GENERAL:  A well-developed and well-nourished  male, who was short of breath.  He had 2+ edema of both legs.  He was  pretty short of breath even at rest using O2.  VITAL SIGNS:  Temperature was 100 degrees, pulse 97, respirations 20,  blood pressure 131/77, and O2 saturation was 92% on 4 L.  CHEST:  Rales bilaterally.  HEART:  Regular without gallops.  ABDOMEN:  Soft without masses.  EXTREMITIES:  He did have 2+ edema of both lower extremities.  CENTRAL NERVOUS SYSTEM:  No focal findings.   His BNP was 874, pO2 was 53, pCO2 of 42, pH 7.38 on 2 L.  White count  660.  He had interstitial infiltrates suggesting pneumonia.  His last  echocardiogram was done in February 2009, which showed an ejection  fraction of 25-30%.   HOSPITAL COURSE:  He was treated with antibiotics, inhaled  bronchodilators, and steroids, and improved greatly.  His blood sugar  was out of  control.  He says it has not been well controlled at home.  By the time of discharge, he was much improved back to his baseline,  perhaps even little better than baseline, and he is being discharged to  home to have Home Health Services.   He is going to be on:  1. Ceftin 500 mg b.i.d. x7 days.  2. Prednisone 40 mg x3 days, 30 mg x3 days, 20 mg x3 days, 10 mg x3      days, and then stop.  We are going to measure an O2 sat and see if      he qualifies for home O2; if so, he will be on 2 L at home.  3. He is going to be on Lasix 40 mg  b.i.d.  4. Cholestyramine 1 g 3 times a day.  5. Gabapentin 300 mg 3 times a day.  6. Penicillin 500 mg 4 times a day that he is taking for his teeth.  7. Albuterol and Atrovent nebulizer treatments 4 times  a day as      needed.  8. Atrovent 4 times a day as needed.  9. Hydrocodone 5/500 q.i.d. p.r.n. pain.  10.Lantus 10 units every evening, which I will actually increase to 20      while he is on prednisone.  11.Simvastatin 40 mg daily.  12.Niacin 1000 mg at bedtime.  13.Nitroglycerin 0.4.  14.Xopenex HFA 2 puffs q.8 h. p.r.n. shortness of breath.  15.Multiple vitamins daily.  16.Aspirin 325 mg daily.  17.Plavix 75 mg daily.  18.Trilipix 135 mg daily.  19.Isosorbide mononitrate 45 mg daily.  20.Enalapril 10 mg daily.  21.Spironolactone 12.5 mg daily.  22.Cymbalta 30 mg b.i.d.  23.Carvedilol 25 mg b.i.d.  24.Omeprazole 20 mg b.i.d.  25.Metformin 500 mg b.i.d.  26.Fish oil 2 tablets b.i.d.   He will have Home Health Services.   He is going to follow a diabetic low-sodium diet.      [name]. Juanetta Gosling, M.D.  Electronically Signed     ELH/MEDQ  D:  01/25/2009  T:  01/26/2009  Job:  [id number]

## 2011-02-16 NOTE — Group Therapy Note (Signed)
[name], [name]                ACCOUNT NO.:  [id number]   MEDICAL RECORD NO.:  [id number]         PATIENT TYPE:  PINP   LOCATION:  A311                          FACILITY:  [facility]   PHYSICIAN:  [name]. Juanetta Gosling, M.D.DATE OF BIRTH:  [birth year]   DATE OF PROCEDURE:  DATE OF DISCHARGE:                                 PROGRESS NOTE   Mr. Dunavant states he feels great and wants to go home.  He has had some  problems with his blood sugar but otherwise, he is doing well.  We are  checking to see if he is going to need oxygen at home, which he does not  currently have.   PHYSICAL EXAMINATION:  VITAL SIGNS:  His temperature is 97.2, pulse 92,  respirations 22, and blood pressure 115/92.  Blood sugar was in the 300s  this morning.  His weight is 89 kg.  CHEST:  Much clearer than before.  HEART:  Regular.  He looks much better.  He has mild, if any, edema now.   ASSESSMENT:  He is much improved.   PLAN:  I am going to plan to discharge him home.  He is going to have  home health service.  He is going to get an O2 sat on room air.  If his  O2 sat is low, then he will need home oxygen.  Please see discharge  summary for details.      [name]. Juanetta Gosling, M.D.  Electronically Signed     ELH/MEDQ  D:  01/25/2009  T:  01/26/2009  Job:  [id number]

## 2011-02-19 NOTE — Procedures (Signed)
NAMEREEF, ACHTERBERG                ACCOUNT NO.:  [id number]   MEDICAL RECORD NO.:  [id number]          PATIENT TYPE:  OUT   LOCATION:  RAD                           FACILITY:  [facility]   PHYSICIAN:  Vida Roller, M.D.   DATE OF BIRTH:  [birth year]   DATE OF PROCEDURE:  11/16/2005  DATE OF DISCHARGE:                                  ECHOCARDIOGRAM   PRIMARY:  Dr. Juanetta Gosling.   HISTORY OF PRESENT ILLNESS:  This is a 55 year old man status post acute  myocardial infarction with percutaneous revascularization who has depressed  LV systolic function. This is an assessment for LV systolic function.   Tape number is [id number], tape count is 2118 through 2625. Technical quality of  the study is good.   M-MODE TRACINGS:  Aorta is 36 mm.   Left atrium is 36 mm.   Septum is 10 mm.   Posterior wall is 10 mm.   Left ventricular diastolic dimension is 54 mm.   Left ventricular systolic dimension is 44 mm.   TWO-D AND DOPPLER IMAGING:  The left ventricle is dilated. There is  depressed LV systolic function with an estimated ejection fraction of 25-  30%. There is anterior, anterior septal, septal and anterolateral akinesis  with apical dyskinesis. The inferior and inferior lateral and inferior  septal walls were all contractile. There is no obvious thrombus in the apex.   Right ventricle is normal size with normal systolic function.   Both atria appear to be top normal in size.   The aortic valve is mildly sclerotic with no stenosis or regurgitation.   The mitral valve has mild annular calcification without significant  stenosis. There is trivial insufficiency.   Tricuspid valve has mild regurgitation.   No pericardial effusion.      Vida Roller, M.D.  Electronically Signed     JH/MEDQ  D:  11/16/2005  T:  11/17/2005  Job:  [id number]

## 2011-02-19 NOTE — Discharge Summary (Signed)
NAMEHOMAR, [name]                ACCOUNT NO.:  [id number]   MEDICAL RECORD NO.:  [id number]          PATIENT TYPE:  INP   LOCATION:  A207                          FACILITY:  [facility]   PHYSICIAN:  Dalia Heading, M.D.  DATE OF BIRTH:  [birth year]   DATE OF ADMISSION:  09/01/2005  DATE OF DISCHARGE:  12/03/2006LH                                 DISCHARGE SUMMARY   HOSPITAL COURSE SUMMARY:  The patient is a 55 year old white male with  multiple medical problems including coronary artery disease, hypertension  and insulin-dependent diabetes mellitus who presented with a colon cancer  that was found by Dr. Jena Gauss. The patient underwent a total colectomy and  flexible sigmoidoscopy on September 01, 2005. He tolerated the procedure  well. His postoperative course was remarkable for a run of ventricular  tachycardia. This was an isolated event and was treated by rate control with  Lopressor. He was followed by cardiology and Dr. Shaune Pollack during his  postoperative stay. He did require 3 units of packed red blood cells. His  final hematocrit was 37. His diet was advanced without difficulty once his  bowel function returned. Final pathology revealed carcinoma in situ in the  cecal villous adenoma. No residual disease was seen in the distal colon. The  patient's CEA level was 3.2 prior to his surgery.   The patient is being discharged home in good and improving condition.   DISCHARGE INSTRUCTIONS:  The patient is to follow up with Dr. Franky Macho  on September 09, 2005.   DISCHARGE MEDICATIONS:  1.  Vicodin one to two tablets p.o. q.4 h p.r.n. pain.  2.  Lantus Insulin 15 units subcu every evening.  3.  __________ two puffs every evening.  4.  Coreg 12.5 mg p.o. twice a day.  5.  NitroQuick p.r.n.  6.  Cymbalta 30 mg p.o. twice a day.  7.  Aspirin one tablet p.o. daily.  8.  Metformin 500 mg p.o. twice a day.  9.  Lisinopril 5 mg p.o. daily.  10. Lasix 40 mg p.o. twice a day.  11.  Omeprazole 20 mg p.o. daily.  12. Albuterol nebs two puffs q.6 h p.r.n.  13. Plavix as previously prescribed.   PRINCIPAL DIAGNOSES:  1.  Colon carcinoma.  2.  Hypertension.  3.  Cardiac dysrhythmia.  4.  Coronary artery disease.  5.  Hypertension.  6.  Insulin-dependent diabetes mellitus.  7.  Chronic obstructive pulmonary disease.   PRINCIPAL PROCEDURE:  Total colectomy, flexible sigmoidoscopy on September 01, 2005.      Dalia Heading, M.D.  Electronically Signed     MAJ/MEDQ  D:  09/05/2005  T:  09/05/2005  Job:  [id number]   cc:   Ramon Dredge L. Juanetta Gosling, M.D.  Fax: [phone]   Vida Roller, M.D.  Fax: [phone]   R. Roetta Sessions, M.D.  [address]  [place]  [place] [address]

## 2011-02-19 NOTE — Assessment & Plan Note (Signed)
[facility]                         [facility] OFFICE NOTE   NEFI, MUSICH                       MRN:          [id number]  DATE:05/05/2006                            DOB:          01-Mar-1956    HISTORY:  Mr. Schwager is a man I follow as an ischemic dilated cardiomyopathy  with an ejection fraction of about to 30% to 40%, severe in-stent restenosis  on a recent catheterization with a new Cipher stent.  He has ongoing chest  discomfort and shortness of breath, New York Heart Associated class III  symptoms, that really is, I think at this point, maximally medicated and  still has pretty significant and powerful symptoms.  We have been able to  control his lower extremity edema, but he really cannot walk more than about  150 feet without getting short of breath.  He has diabetes, hyperlipidemia,  chronic obstructive pulmonary disease; has recently quit smoking which is, I  think, very encouraging.   MEDICATIONS:  1.  Multivitamin with iron once a day.  2.  Aspirin 325 once a day.  3.  Glucophage 500 twice a day.  4.  Coreg 12.5 b.i.d.  5.  Lasix 40 b.i.d.  6.  Lantus insulin 15 units in the evening.  7.  Plavix 75 mg a day.  8.  Prevacid 30 mg a day.  9.  Tricor 145 once a day.  10. Vasotec 5 mg once a day.  11. Isordil 30 mg once a day.   He has recently added the Isordil and that has significantly helped his  chest discomfort.  He now does not need to take sublingual nitroglycerin, so  that is appealing.   PHYSICAL EXAMINATION:  VITAL SIGNS:  178 pounds which is stable from his  previous weight.  Blood pressure 94/60 in both arms and his pulse is 84.  CHEST:  Decreased breath sounds bilaterally at the bases but no obvious  rales.  CARDIOVASCULAR:  Regular with a 2/6 holosystolic murmur.  EXTREMITIES:  Lower extremities have just trace edema at the ankle.   LABORATORY DATA:  Electrocardiogram shows sinus rhythm, left  atrial  enlargement, right bundle branch block with an RSR prime, consistent with a  diagnosis of right ventricular hypertrophy.  He has voltage criteria for  left ventricular hypertrophy, as well; septal Q-waves consistent with an  anterior wall myocardial infarction and left axis deviation.  Overall, this  is really not significantly changed from previous.  Actually, his heart rate  seems a little bit better controlled than usual.   So, I am going to add a Statin agent to his medical therapy because we got a  set of lipids on him and his LDL cholesterol is 96 with a total of 177.  His  triglycerides are 218 with HDL of 37.  LFTs look fine.  We are really kind  of at a point with Mr. Tellefsen where there is not a lot else to do.  He is  pretty limited in his overall exercise tolerance.  He is able to do his  routine daily activities  of life and appears to be reasonably stable at this  point.  We are really hitting his secondary prophylaxis hard.  We have his  blood pressure under control.  We have his cholesterol significantly better  than it has been previously.  His diabetes is still a target of opportunity,  as well as we have now gotten to stop smoking; so, we are moving in the  right direction, but I think there has been a significant amount of damage.  He is reasonably disabled and is pending some disability evaluation, as  well.   Will see him back in about four to five months for a routine followup.  He  will need cholesterol check at that time.  We have given him some Crestor 10  mg q.h.s. to take and asked him to take that.  We have given him free  samples, so he will be back in six weeks for a refill of that, as well as  his fasting lipids.                                   Farris Has. Dorethea Clan, MD   JMH/MedQ  DD:  05/05/2006  DT:  05/05/2006  Job #:  [id number]   cc:   Ramon Dredge L. Juanetta Gosling, MD

## 2011-02-19 NOTE — Cardiovascular Report (Signed)
NAMEALBERT, [name]                ACCOUNT NO.:  [id number]   MEDICAL RECORD NO.:  [id number]          PATIENT TYPE:  INP   LOCATION:  2305                         FACILITY:  [facility]   PHYSICIAN:  Salvadore Farber, M.D. LHCDATE OF BIRTH:  1956-08-16   DATE OF PROCEDURE:  02/10/2005  DATE OF DISCHARGE:                              CARDIAC CATHETERIZATION   PROCEDURE:  1.  Left heart catheterization.  2.  Left ventriculography.  3.  Coronary angiography.  4.  Bare-metal stent placement in the mid left anterior descending.  5.  StarClose closure of the right common femoral arteriotomy site.   INDICATION:  [name] is a 55 year old gentleman with hypertension,  diabetes mellitus, ongoing tobacco abuse, and family history of premature  atherosclerotic disease, who presents with acute anterior myocardial  infarction.  He says that he had left arm discomfort throughout the day  yesterday.  Today, he developed associated chest discomfort that was as  severe as 9 out of 10.  He presented to [facility] Emergency Room at [phone].  There, electrocardiogram demonstrated 10 mm of anterior ST elevation, with  also ST elevation in the inferior leads.  He was treated with aspirin,  heparin, nitroglycerin, morphine, eptifibatide, and Plavix, and transferred  for urgent cardiac catheterization with an eye to percutaneous  revascularization.  Upon arrival here, his chest pain had resolved.  ST  elevations had improved, but not completely resolved.   PROCEDURAL TECHNIQUE:  Informed consent was obtained.  Under 1% lidocaine  local anesthesia, a 7-French sheath was placed in the right common femoral  artery using the modified Seldinger technique.  Diagnostic angiography and  ventriculography were performed using JL4, JR4 and pigtail catheters.  These  images demonstrated the culprit lesion to be a 95% thrombotic lesion of the  mid LAD.  Decision was made to proceed to percutaneous revascularization.  Additional heparin was given to achieve and maintain an ACT of greater than  200 seconds.  It was not clear whether the patient had received 1 or 2  boluses of eptifibatide at the outside hospital.  Therefore, 1 additional  bolus was administered here.   A 7-French CLS 3.5 guide was advanced over a wire and engaged in the ostium  of the left main.  A Prowater wire was advanced across the lesion and into  the distal LAD without difficulty.  The lesion was then predilated using a  3.0 x 15-mm Maverick at 6 atmospheres.  I then stented the lesion using a  2.75 x 18-mm Vision bare-metal stent.  A bare-metal stent was chosen due to  marked erythrocytosis (hematocrit 63%) with possible associated  hypercoagulability.  The stent was deployed at 16 atmospheres.  The stent  was then post-dilated using a 3.25 x 18-mm PowerSail at 18 atmospheres.  Final angiography demonstrated no residual stenosis, no dissection, and TIMI-  3 flow to the distal vasculature.   After imaging the femoral vessels, the arteriotomy was closed using a  StarClose device.  There was modest ooze which resolved with approximately  10 minutes of gentle manual compression.  The patient  was then transferred  to the intensive care unit in stable condition, having tolerated the  procedure well.   COMPLICATIONS:  None.   FINDINGS:  1.  LV:  132/8/26.  EF 51% with distal anterior and apical akinesis.  2.  Left main:  Angiographically normal.  3.  LAD:  A large vessel wrapping the apex of the heart and giving rise to a      single large diagonal.  There was a 95% stenosis of the mid-vessel      treated to no residual stenosis using a bare-metal stent.  4.  Circumflex:  A moderate-sized vessel giving rise to 2 large obtuse      marginals.  It is angiographically normal.  5.  Right coronary artery:  A moderate-sized dominant vessel.  It is      angiographically normal.   IMPRESSION/RECOMMENDATION:  Successful stenting of the mid  left anterior  descending using a bare-metal stent.  We will plan on Plavix for a year.  If  he turns out to have polycythemia vera, would consider indefinite Plavix.  Aspirin will be continued indefinitely.  ACE inhibitor and beta blocker will  be initiated.  A high-dose statin will be initiated.  Smoking cessation was  strongly advised.      WED/MEDQ  D:  02/10/2005  T:  02/11/2005  Job:  [id number]   cc:   [name], M.D.  [address]  [place]  [place] [address]  Fax: [phone]

## 2011-02-19 NOTE — H&P (Signed)
[name], [name]                ACCOUNT NO.:  [id number]   MEDICAL RECORD NO.:  [id number]          PATIENT TYPE:  INP   LOCATION:  2305                         FACILITY:  [facility]   PHYSICIAN:  Salvadore Farber, M.D. LHCDATE OF BIRTH:  May 03, 1956   DATE OF ADMISSION:  02/10/2005  DATE OF DISCHARGE:                                HISTORY & PHYSICAL   CHIEF COMPLAINT:  Chest pain.   HISTORY OF PRESENT ILLNESS:  [name] is a 55 year old man with risk  factors of age, male sex, ongoing tobacco abuse, diabetes mellitus,  hypertension, and family history of premature atherosclerotic disease. There  is no prior history of cardiac disease. For the past six months he has had  exertional dyspnea. Beginning yesterday he suffered left arm discomfort for  most of the day. He awoke with similar discomfort. At approximately 11:00  this morning he additionally developed substernal chest discomfort that got  as severe as 9/10. This was associated with increased shortness of breath  over his recent baseline and ongoing left arm discomfort. He does not have  any palpitations. There was no syncope, nausea, or emesis. He presented to  [facility] emergency room at 4:39 p.m. and electrocardiogram  demonstrated both anterior and inferior ST elevations. He was treated with  aspirin, heparin, Plavix, eptifibatide, metoprolol, and morphine. He was  transferred here emergently for consideration of cardiac catheterization.  Upon arrival he was pain free, but had persistent ST-elevations on monitor.   PAST MEDICAL HISTORY:  Hypertension, diabetes mellitus.   ALLERGIES:  The patient states he is allergic to a medication that starts  with V. Cannot recall any other details.   CURRENT MEDICATIONS AT HOME:  Olmesartan 40 mg daily and Actos 30 mg daily.   SOCIAL HISTORY:  The patient is married with one 26 year old son. He has  been disabled due to shortness of breath.  He continues to smoke two  packs  per day of tobacco. He drinks 12-24 beers per week. He has no history of  delirium tremens or withdrawal seizures.   FAMILY HISTORY:  Father died at age 7 of coronary artery disease. Mother  may have had coronary artery disease. The patient is not sure. Brother  suffered myocardial infarction at age 36. His 47 year old son is alive and  well.   REVIEW OF SYSTEMS:  Complains of rare hemoptysis. Frequent small volume  bright red blood per rectum. Marked dyspnea times six months. Denies  paresthesias. Review of systems is otherwise negative in detail except as  above.   PHYSICAL EXAMINATION:  GENERAL: He is a moderately ill-appearing thin male  in no distress.  VITAL SIGNS: Heart rate 113, blood pressure 161/114, and oxygen saturation  94% on two liters.  HEENT: Poor dentition. Well healed scars on his face. Otherwise, normal.  SKIN: Normal.  NECK: He has jugular venous distention and no thyromegaly.  LUNGS: Clear to auscultation. He has nondisplaced point of maximal cardiac  impulse. The respiratory effort is normal.  HEART: Regular rate and rhythm with normal S1 and S2. There is an S4, but no  S3. No murmur.  ABDOMEN: Soft, nondistended, and nontender. There is no hepatosplenomegaly.  Bowel sounds are normal.  EXTREMITIES: Warm without clubbing, cyanosis, edema, or ulceration. Carotid  pulses are 2+ bilaterally without bruits. Femoral pulses are 2+ bilaterally  without bruits. There is no cervical, supraclavicular, or axillary  lymphadenopathy.  NEUROLOGIC: Normal.   Laboratory studies remarkable for potassium of 129, creatinine 1.0, glucose  433, hemoglobin 21, hematocrit 62, platelet count 185,000, WBC 10.0, AST  202, ALT 41, alkaline phosphatase 98, albumin 3.1, total bilirubin 0.5, CK-  MB 78, troponin 4.6.   Electrocardiogram demonstrates sinus tachycardia with ST elevations  anteriorly and inferiorly.   ASSESSMENT/PLAN:  1.  Anterior myocardial infarction:  Clinically may have reperfused with a      IIb/IIIa. Nonetheless, he does have persistent ST elevations. Will      therefore proceed urgently to catheterization with possible percutaneous      revascularization. Will plan on continuing aspirin, Plavix, ARB, and      beta blocker.  2.  Question lipid status: Check fasting lipid profile. Begin high dose      statins.  3.  Diabetes mellitus: Continue Actos. Add sliding scale insulin. Check      hemoglobin A1C. Will recheck glucose after procedure and consider      glucomander for establishing tight glucose control.  4.  Erythrocytosis: Etiology is not clear. Level of hemoglobin is markedly      more than we would expect with tobacco abuse alone.  No history of      congenital heart disease and no clubbing or cyanosis is suggested. He is      mildly hypoxic on two liters by nasal cannula. This appears to be in the      normal range for mild heart failure accompanying an acute anterior      myocardial infarction, however. Thus, concern is raised for polycythemia      vera. Will check erythropoietin level tomorrow. Will check room air      oxygen saturation. Will plan on consulting hematology tomorrow.  5.  Hyponatremia: Mild. Sodium corrects to 135 when adjusted for glucose.      Will recheck in the morning.  6.  Hypertension: Continue ARB and add beta blocker.  7.  Elevated transaminases: Likely due to myocardial injury.      WED/MEDQ  D:  02/10/2005  T:  02/10/2005  Job:  [id number]   cc:   Ramon Dredge L. Juanetta Gosling, M.D.  [address]  [place]  [place] [address]  Fax: [phone]

## 2011-02-19 NOTE — Discharge Summary (Signed)
NAMESOUL, HACKMAN                ACCOUNT NO.:  [id number]   MEDICAL RECORD NO.:  [id number]          PATIENT TYPE:  OIB   LOCATION:  6533                         FACILITY:  [facility]   PHYSICIAN:  Arturo Morton. Riley Kill, M.D. [facility] OF BIRTH:  [birth year]   DATE OF ADMISSION:  11/30/2005  DATE OF DISCHARGE:  12/01/2005                                 DISCHARGE SUMMARY   ADDENDUM TO DISCHARGE SUMMARY:   DISCHARGE LABORATORY DATA:  Hemoglobin 9.8, hematocrit 28.2, white count  6,800, platelet count 225,000.  Sodium 137, potassium 3.6, BUN 4, creatinine  0.7, glucose 83.  Post procedure CK-MB 2.3 and troponin I 0.05.  Chest x-  ray:  Peribronchial thickening, no active lung disease.      Tereso Newcomer, P.A.      Arturo Morton. Riley Kill, M.D. [facility]  Electronically Signed    SW/MEDQ  D:  12/01/2005  T:  12/01/2005  Job:  [id number]   cc:   Ramon Dredge L. Juanetta Gosling, M.D.  Fax: [phone]   Vida Roller, M.D.  Fax: [phone]   Salvadore Farber, M.D. [facility]  [address]  [address]  [place]  [place] [address]

## 2011-02-19 NOTE — Cardiovascular Report (Signed)
[name], [name]                ACCOUNT NO.:  [id number]   MEDICAL RECORD NO.:  [id number]          PATIENT TYPE:  OIB   LOCATION:  6533                         FACILITY:  [facility]   PHYSICIAN:  Salvadore Farber, M.D. LHCDATE OF BIRTH:  09/04/1956   DATE OF PROCEDURE:  11/30/2005  DATE OF DISCHARGE:                              CARDIAC CATHETERIZATION   PROCEDURE:  Left heart catheterization, left ventriculography, intravascular  ultrasound of the LAD, drug-eluting stent placement in LAD.   INDICATIONS:  Mr. Hinch is a 55 year old gentleman suffered anterior  myocardial infarction in May 2006. I stented his mid-LAD with a bare metal  stent. He now returns with severe and progressive exertional dyspnea. By  echocardiogram, his ejection fraction is said to have declined from 50% to  25%. He is referred for left and right heart catheterization, possible  percutaneous coronary revascularization.   PROCEDURAL TECHNIQUE:  Informed consent was obtained. Under 1% lidocaine  local anesthesia, a 5-French sheath was placed in the right common femoral  artery and a 7-French sheath in the right common femoral vein using the  modified Seldinger technique. Right heart catheterization was performed  using a balloon-tipped catheter. Cardiac output was calculated using both  Fick and thermodilution techniques.   CORONARY ANGIOGRAPHY:  Left heart catheterization ventriculography were  performed using a pigtail catheter. Coronary angiography was performed using  a JL-4 and JR-4 catheters.   Images demonstrated at least moderate in-stent restenosis in the distal  portion of the LAD stent. To better clarify the severity of this stenosis, I  decided to proceed to intravascular ultrasound.   Anticoagulation was initiated with heparin to achieve and maintain an ACT of  greater than 250 seconds. Arterial sheath was upsized over wire to 6-French.  A 6-French CLS 3.5 guide was advanced over wire and  engaged in the ostium of  the left main. A Prowater wire was advanced to the distal LAD without  difficulty. Intravascular ultrasound was performed. The first intravascular  ultrasound catheter malfunctioned. It was therefore replaced with another  which functioned properly. This demonstrated severe in-stent restenosis  which proliferated just beyond the distal margin of the stent. I then  proceeded to treat this with placement of a drug-eluting stent. I advanced a  2.75 x 33 mm Cypher across and used it to span the entirety of the  previously placed stent and extend distally through a segment of moderate  disease. I deployed it at 16 atmospheres. I then postdilated using a 3.0 x  20 mm Quantum at 16 atmospheres most distally and 20 atmospheres at the  region of overlap. I then further postdilated the proximal segment using a  3.5 x 20 mm Quantum at 18 atmospheres. I then repeated intravascular  ultrasound. This demonstrated the stent to be well expanded. It was fully  apposed with the exception of the most proximal strut. I then further  postdilated this proximal portion using a 3.75 x 8 mm Quantum at 18  atmospheres. I then repeated intravascular ultrasound. This demonstrated the  stent to be fully apposed with the exception of a single  strut. Rather than  risk balloon injury outside the stented segment, I decided to leave this as  the lack of apposition was minimal. Final angiography demonstrated no  residual stenosis, no dissection, and TIMI III flow to the distal  vasculature. A jailed large septal perforator had a 99% stenosis but TIMI  III flow. The patient had no chest discomfort.   I then attempted to close the arteriotomy using a Starclose device. The  Starclose device went through each of its clicks without difficulty.  However, it was difficult to remove from the body. Upon removal, it was  clear the device had failed and it appeared that the closure nitinol  remained within  the device. I therefore administered 50 milligrams of  protamine and held manual compression. Complete hemostasis was obtained. He  was then transferred to the holding room in stable condition.   COMPLICATIONS:  None.   FINDINGS:   HEMODYNAMIC RESULTS:  1.  RA 13/12/10, RV 39/5/11, PA 40/19/28, PCW 20/18/17, aorta 98/62/79, LV      103/4/16. Cardiac output by Fick is 3.8 with an index of 1.8. By      thermodilution, it was 8.6 with an index of 4.4. I think the      thermodilution is an error. His pulmonary arterial oxygen saturation was      53%. There is no aortic stenosis or mitral stenosis.  2.  Ventriculography: EF 45% with apical dyskinesis and anterolateral      hypokinesis.  3.  Left main: Angiographically normal.  4.  LAD: Moderate-sized vessel giving rise to two diagonals. There was focal      90% in-stent restenosis in the distal portion of the previously placed      stent as well as moderate in-stent restenosis throughout the stented      segment. This was treated with a single drug-eluting stent with      excellent angiographic result.  5.  Circumflex: Large vessel giving rise to two obtuse marginals. It is      angiographically normal.  6.  RCA: Large dominant vessel. It is angiographically normal.   IMPRESSION/PLAN:  1.  Successful treatment of in-stent restenosis of the mid-left anterior      descending artery using drug-eluting stent. He should be maintained on      aspirin and Plavix for life.  2.  Elevated pulmonary capillary wedge pressure and impaired cardiac output.      The patient will likely benefit from up titration of his ACE inhibitor      in the weeks to come.      Salvadore Farber, M.D. [facility]  Electronically Signed     WED/MEDQ  D:  11/30/2005  T:  11/30/2005  Job:  [id number]   cc:   Vida Roller, M.D.  Fax: [phone]   Oneal Deputy. Juanetta Gosling, M.D.  Fax: [phone]

## 2011-02-19 NOTE — Assessment & Plan Note (Signed)
[facility]                         [place] CARDIOLOGY OFFICE NOTE   [name]                       MRN:          [id number]  DATE:05/31/2006                            DOB:          11-23-1955    PRIMARY CARE PHYSICIAN:  [name], M.D.   [name] returns today, this is a man I followed who has severe ischemic  cardiomyopathy coronary artery disease status post percutaneous  revascularization initially in 2006 with a bare metal sent subsequently  instant restenosis with a depressed LV systolic function.  He had a Cypher  stent placed in his LAD in February of 2007.  His ejection fraction at that  time improved somewhat to 45%.  He has New York Heart Association class 3  symptoms and occasionally has angina which he actually tolerates reasonably  well.  He has diabetes, hyperlipidemia, and COPD.  Recently stopped smoking  and overall actually has been reasonably stable.  He is now contemplating  undergoing relatively extensive dental surgery under the care of  Dr.  Bradly Chris, [place], Shea [name] at the [facility].  [name] is his primary dentist there.  The  contemplated procedure is the surgical removal of all remaining teeth  without veloplasty under intravenous anesthetic in their office and they  asked if we would address this directly.  [name] does occasionally have  exertional chest discomfort.  He has relatively limited functional status  and really is in [place] Heart Association class 3B for symptoms.  We have  been able to keep his secondary risk factor profile reasonably well  maintained.  His lipids, which were evaluated in July, looks pretty good.  LDL was about 96.  Total was okay.  He has stopped smoking.  His liver  function studies are fine.  He is on a very reasonable medication regime  which is the following:  1. Multiple vitamin once a day.  2. Aspirin  325 once a day.  3. Glucophage 500 twice a day.  4. Coreg 12.5 b.i.d.  5. Lasix 40 b.i.d.  6. Lantus insulin 15 units q. h.s.  7. Plavix 75 a day.  8. Prevacid 30 a day.  9. Tricor 145 a day.  10.Vasotec 5 mg a day.  11.Isordil 30 mg a day.  12.Crestor 10 mg q. h.s.  13.He uses DuoNeb.  14.Nitroglycerin.  15.Proventil inhalers.  16.Cymbalta.  17  Levoalbuterol on an as needed basis.   PHYSICAL EXAMINATION:  VITAL SIGNS:  Today on physical exam he is 176 pounds  which is stable from previous.  His blood pressure is 100/70.  Pulse is 70.  CHEST:  Chest has decreased breath sounds at the bases but no rales.  CARDIOVASCULAR:  Regular.  He does not have an S3 or an S4.  He does have a  2/6 systolic murmur heard best at the upper right sternal border.  EXTREMITIES:  Lower extremities are without edema.   So from the standpoint of his coronary disease this appears to be I think  reasonably stable.  The discomfort  in his chest is a little bit concerning  however.  Heart failure situation New York Heart Association class 3B  symptoms reasonably stable at this point.  The question is always with Mr.  [name] is he is right on the borderline of potentially needing a  defibrillator in place.  His most recent electrocardiogram that I have shows  a right bundle branch block configuration with a QRS duration of 114  milliseconds so does not meet directly criteria for a biventricular pacer  and his scar is extensive on his perfusion study, mostly in the anterior,  anterior septal portions so may not benefit directly from bi-ventricular  pacing.  I guess we should probably do a dyssynchrony and see and  potentially he will need to do that but with his recurrent chest discomfort  I think it is probably reasonable to do a perfusion study with him and get  him setup to do that prior to his surgery and I think at this point emphasis  should be placed on the following issues for him:  1. That he is  not a candidate for outpatient surgery given his tenuous      cardiac status.  This should be emphasized to the surgeons.  2. Is that if his perfusion study looks fine there is probably no further      evaluation that he needs before his surgery, although ongoing he should      probably have an evaluation for ventricular dyssynchrony and potential      referral for biventricular pacing and potentially for a defibrillator      given the anatomy and the left ventricular systolic function.  3. Finally, emphasis should be certainly placed on the fact that he will      need life-long aspirin and Plavix.  This has been well documented by      his interventional cardiologist [name] and his last heart      catheterization after his Cypher stent was placed and this cannot be      stopped and should not be stopped perioperatively for his dental      surgery.  Beyond that, no much more to offer him.  He is reasonably      stable at this point and we will have him seen back after his perfusion      study.                                   [name] Has. Dorethea Clan, MD   JMH/MedQ  DD:  05/31/2006  DT:  06/01/2006  Job #:  [id number]   cc:   [name] Dredge L. Juanetta Gosling, MD

## 2011-02-19 NOTE — Op Note (Signed)
[facility]  Patient:    [name], [name] Visit Number: [id number] MRN: [id number]          Service Type: MED Location: 3A A306 01 Attending Physician:  Dalia Heading Dictated by:   Franky Macho, M.D. Proc. Date: 12/01/01 Admit Date:  11/30/2001   CC:         Kari Baars, M.D.   Operative Report  PREOPERATIVE DIAGNOSIS:  Incarcerated right inguinal hernia.  POSTOPERATIVE DIAGNOSIS:  Incarcerated right inguinal hernia, right hydrocele.  PROCEDURE:  Right inguinal herniorrhaphy.  SURGEON:  Franky Macho, M.D.  ANESTHESIA:  Spinal.  INDICATIONS:  The patient is a 55 year old white male with uncontrolled diabetes mellitus who also presented with an incarcerated right inguinal hernia.  The risks and benefits of the procedure including bleeding, infection, and recurrence of the hernia were fully explained to the patient, who gave informed consent.  DESCRIPTION OF PROCEDURE:  The patient was placed in the supine position after spinal anesthesia was administered.  The right groin region was prepped and draped using the usual sterile technique with Betadine.  A right groin incision was made down to the external oblique aponeurosis.  The aponeurosis was incised to the external ring.  The patient was noted to have a large hernia sac extending down to the right testicle.  In addition, a large right hydrocele was present though this was separate from the indirect hernia sac. The hernia sac was incised and freed away from the spermatic cord up to the peroneal reflection.  A high ligation of the hernia sac was performed after reduction of the bowel contents using a 2-0 Novofil pursestring suture x 2. Next, a large size Marlex mesh plug was placed in this region to keep the perineal hernia sac inverted.  The hydrocele was then resected without difficulty.  Care was taken to avoid the vas deferens.  Next, an onlay Marlex mesh patch was placed along the  floor of the inguinal canal and secured superiorly to conjoined tendon and inferiorly to the shelving edge of Pouparts ligament using 2-0 Novofil interrupted sutures.  The internal ring was recreated using a 2-0 Novofil interrupted suture.  The external oblique aponeurosis was reapproximated using a 2-0 Vicryl running suture.  The subcutaneous layer was reapproximated using a 3-0 Vicryl interrupted suture. The skin was closed using staples.  Marcaine 0.5% was instilled into the surrounding wound.  The wound was covered with Betadine and dry sterile dressing.  All tape and needle counts were correct at the end of the procedure.  The patient was taken to PACU in stable condition.  Complications - none.  Specimen - right inguinal hernia sac.  Blood loss minimal. Dictated by:   Franky Macho, M.D. Attending Physician:  Dalia Heading DD:  12/01/01 TD:  12/01/01 Job: [id number]

## 2011-02-19 NOTE — Op Note (Signed)
[name], [name]                ACCOUNT NO.:  [id number]   MEDICAL RECORD NO.:  [id number]          PATIENT TYPE:  AMB   LOCATION:  DAY                           FACILITY:  [facility]   PHYSICIAN:  R. Roetta Sessions, M.D. DATE OF BIRTH:  06/13/1956   DATE OF PROCEDURE:  08/17/2005  DATE OF DISCHARGE:                                 OPERATIVE REPORT   PROCEDURE:  Colonoscopy with biopsy and snare polypectomy.   INDICATIONS FOR PROCEDURE:  The patient is a 55 year old gentleman with a  history of intermittent hematochezia and now positive family history of  colon cancer in his 18 year old mother who just underwent a resection. He  has never had his lower GI tract imaged. Colonoscopy is now being done. This  approach has been discussed with the patient at length. Potential risks,  benefits, and alternatives have been reviewed and questions answered. He is  agreeable. Please see documentation in the medical record.   PROCEDURE NOTE:  O2 saturation, blood pressure, pulse, and respirations were  monitored throughout the entire procedure. Conscious sedation with IV Versed  and IV Demerol in incremental doses. The patient stopped his Coumadin six  days ago.   INSTRUMENT:  Olympus video chip system, pediatric and adult colonoscopy.   FINDINGS:  Digital rectal exam revealed no abnormalities.   ENDOSCOPIC FINDINGS:  Prep was adequate.   Rectum:  Examination of the rectal mucosa including retroflexed view of the  anal verge revealed 0.5-mm polyp in at 5 cm and a pedunculated 1.5-cm polyp  on a long stalk at 15 cm that was friable. The remainder of the rectal  mucosa appeared normal.   Colon:  Colonic mucosa was surveyed from the rectosigmoid junction through  the left, transverse, and right colon to the area of the appendiceal  orifice, ileocecal valve, and cecum. I initially approached the colon with  the pediatric colonoscope. Due to recurrent looping in the left colon, and I  withdrew it  and obtained the adult scope. I was able to advance to the  cecum. Cecum, ileocecal valve and appendix were seen and photographed. From  this level, the scope was slowly withdrawn, and all previously mentioned  mucosal surfaces were again seen. The following additional abnormalities  were noted:  1.  The patient had pancolonic diverticula.  2.  The patient had multiple 4 x 6 mm polyps near the hepatic flexure. One      was cold snared, and the other two were hot snared.  3.  There was at least a 3-cm pedunculated mass growing out of the base of      the cecum behind and partially obscured by the ileocecal valve. It      appeared to have a significant sessile component. I was unable to gain      visualization to assess the entire extent of this tumor. It was not felt      to be amendable to endoscopic resection. It was biopsied multiple times.   The polyps in the area of the hepatic flexure were removed with snare  cautery, one with cold technique,  two with hot snare. In the rectum, both  polyps were removed with the hot snare technique. Multiple samples were  submitted to the pathologist. The patient tolerated the procedure well and  was reactive to endoscopy   IMPRESSION:  1.  Multiple rectal polyps removed as described above. The larger of the two      likely responsible for intermittent hematochezia.  2.  Pancolonic diverticula.  3.  Multiple polyps at the hepatic flexure resected with a snare.  4.  Large polypoid lesion (total extent of which could be assessed      endoscopically) growing out of the base of the cecum, just behind the      ileocecal valve. This was not felt to be amendable to endoscopic      resection. Biopsied multiple times.   RECOMMENDATIONS:  1.  The patient is to resume his Coumadin on August 19, 2005.  2.  Diverticulosis literature provided to Mr. Marquis.  3.  Follow up on pathology.  4.  The patient is going to need to get his cecum resected. Will follow  up      on pathology. Will make further recommendations in the very near future.      Jonathon Bellows, M.D.  Electronically Signed     RMR/MEDQ  D:  08/17/2005  T:  08/17/2005  Job:  [id number]   cc:   Ramon Dredge L. Juanetta Gosling, M.D.  Fax: [phone]

## 2011-02-19 NOTE — H&P (Signed)
NAMENEHEMYAH, FOUSHEE                ACCOUNT NO.:  [id number]   MEDICAL RECORD NO.:  [id number]          PATIENT TYPE:  AMB   LOCATION:                                FACILITY:  [facility]   PHYSICIAN:  Dalia Heading, M.D.  DATE OF BIRTH:  10-21-1955   DATE OF ADMISSION:  DATE OF DISCHARGE:  LH                                HISTORY & PHYSICAL   CHIEF COMPLAINT:  Carcinoma.   HISTORY OF PRESENT ILLNESS:  The patient is a 55 year old white male who is  referred for evaluation and treatment of colon carcinoma.  He was found on  colonoscopy, by Dr. Jena Gauss, to have multiple areas of adenomatous polyps as  well as a cecal polyp which could not be removed and carcinoma in situ found  at 15-cm from the anus.  He has a strong family history of colon carcinoma.   PAST MEDICAL HISTORY:  1.  Coronary artery disease.  2.  Hypertension.  3.  Insulin-dependent-diabetes mellitus.  4.  Reflux disease.  5.  Extrinsic allergies.  6.  COPD.   PAST SURGICAL HISTORY:  1.  Right inguinal herniorrhaphy, 2003.  2.  Left ankle surgery.  3.  Coronary stenting, earlier this year.   CURRENT MEDICATIONS:  1.  Asmanex two puffs every evening.  2.  Lantus insulin 15 units subcu every evening.  3.  Coreg 12.5 mg p.o. b.i.d.  4.  NitroQuick p.r.n.  5.  Cymbalta 30 mg p.o. b.i.d.  6.  Aspirin one tablet p.o. every day which he is holding.  7.  Metformin 500 mg p.o. b.i.d.  8.  Lisinopril 5 mg p.o. every day.  9.  Lasix 40 mg p.o. b.i.d.  10. Omeprazole 20 mg p.o. every day.  11. Levalbuterol two puffs q.6h. p.r.n.   ALLERGIES:  VANCOMYCIN.   REVIEW OF SYSTEMS:  The patient smokes more than a pack of cigarettes a day.  He drinks alcohol daily.  He denies any recent chest pain, CVA, or bleeding  disorder.   The patient is followed by Dr. Dorethea Clan of [facility] who knows the  patient is needing surgery.  He has been off his Plavix and aspirin.   PHYSICAL EXAMINATION:  GENERAL:  The patient is a  well-developed, well-  nourished, white male in no acute distress.  LUNGS:  Clear to auscultation with equal breath sounds bilaterally.  HEART:  Reveals a regular rate and rhythm without S3, S4, or murmurs.  ABDOMEN:  Soft, nontender, nondistended.  No hepatosplenomegaly, masses, or  hernias are identified.   IMPRESSION:  Colon carcinoma, multiple polyps.   PLAN:  The patient is scheduled for a total colectomy with flexible  sigmoidoscopy on September 01, 2005.  The risks and benefits of the procedure  including bleeding, infection, cardiopulmonary difficulties, the possible  need for blood transfusion, the possibility of an ostomy were fully  explained to the patient, gave informed consent.      Dalia Heading, M.D.  Electronically Signed     MAJ/MEDQ  D:  08/24/2005  T:  08/24/2005  Job:  [id number]  cc:   Dalia Heading, M.D.  Fax: [phone]   [facility]  Fax: [phone]   Oneal Deputy. Juanetta Gosling, M.D.  Fax: [phone]   R. Roetta Sessions, M.D.  [address]  [place]  [place] [address]   Vida Roller, M.D.  Fax: [phone]

## 2011-02-19 NOTE — Op Note (Signed)
[name], [name]                ACCOUNT NO.:  [id number]   MEDICAL RECORD NO.:  [id number]          PATIENT TYPE:  AMB   LOCATION:  DAY                           FACILITY:  [facility]   PHYSICIAN:  [name], M.D. DATE OF BIRTH:  13-Oct-1955   DATE OF PROCEDURE:  06/13/2006  DATE OF DISCHARGE:                                 OPERATIVE REPORT   PROCEDURE:  Flexible sigmoidoscopy with biopsy.   INDICATIONS FOR PROCEDURE:  The patient is a 55 year old gentleman who  underwent a total colectomy almost one year ago for multiple polyps and  colorectal cancer.  Surgery was performed by Dr. Lovell Sheehan.  He has seen Dr.  Mariel Sleet.  He is felt to have limited stage disease.  He did not receive  any adjuvant therapy.  He has 10 to 12 bowel movements daily.  He has not  passed any blood per rectum.  He is not really having any abdominal pain.  He is gaining weight.  He is now coming for his one-year surveillance  somewhat early.  This approach has been discussed with the patient at  length.  The potential risks, benefits, and alternatives have been reviewed  and questions answered.  He is agreeable.  Please see the documentation in  the medical record.   PROCEDURE:  O2 saturation, blood pressure, pulses, and respirations were  monitored throughout the entirety of the procedure.  Conscious sedation was  with Versed 2 mg IV, Demerol 50 mg IV in divided doses.  The instrument used  was the Olympus video chip system.   FINDINGS:  Digital rectal examination revealed no abnormalities.   ENDOSCOPIC FINDINGS:  The prep was good.   Examination of the rectal mucosa including retroflex view of the anal verge  was undertaken.  The anastomosis was at 25 cm.  Pretty much all of his  rectum was intact.  The anastomosis appeared normal; however, beside the  suture, there was a 3 x 4-cm area of erythematous, almost adenomatous-  appearing mucosa which may be nothing more than granulation tissue.  It was  biopsied.  It was friable.  The distal small bowel was intubated a good 15  cm.  This segment of GI tract appeared normal.  There was no evidence of  frank recurrent neoplasia.   The patient tolerated the procedure well and was reactive in endoscopy.   IMPRESSION:  Essentially normal residual rectum status post total colectomy.  Focal area of abnormality adjacent to suture, as described above, likely not  significant, suspect granulation tissue, biopsied.  Anastomosis otherwise  appeared normal.  Distal terminal ileum appeared normal.   RECOMMENDATIONS:  1. Follow up on pathology.  2. Trial of Questran 4 g orally daily, not to be taken within two hours of      any other medication, as an off-label use resin binder to diminish his      frequency of stooling.  3. Will plan to see this nice gentleman back in three months.      Jonathon Bellows, M.D.  Electronically Signed     RMR/MEDQ  D:  06/13/2006  T:  06/13/2006  Job:  [id number]   cc:   Ladona Horns. Mariel Sleet, MD  Fax: [phone]   Oneal Deputy. Juanetta Gosling, M.D.  Fax: [phone]   Dalia Heading, M.D.  Fax: [phone]

## 2011-02-19 NOTE — Discharge Summary (Signed)
[name], [name]                ACCOUNT NO.:  [id number]   MEDICAL RECORD NO.:  [id number]          PATIENT TYPE:  OIB   LOCATION:  6533                         FACILITY:  [facility]   PHYSICIAN:  Salvadore Farber, M.D. LHCDATE OF BIRTH:  [birth year]   DATE OF ADMISSION:  11/30/2005  DATE OF DISCHARGE:  12/01/2005                                 DISCHARGE SUMMARY   PRIMARY CARDIOLOGIST:  Dr. Vida Roller   PRIMARY CARE PHYSICIAN:  Dr. Shaune Pollack   REASON FOR ADMISSION:  Progressive dyspnea and worsening exercise  intolerance - ischemic equivalent.   DISCHARGE DIAGNOSES:  1.  Coronary artery disease.      1.  Status post anterior wall myocardial infarction 2006 treated with          Multilink stent to the left anterior descending.      2.  Ischemic cardiomyopathy, ejection fraction 45%.      3.  Status post CYPHER stenting to the left anterior descending          secondary to in-stent restenosis.  2.  Anemia.  Discharge hemoglobin 9.8.  3.  Diabetes mellitus type 2.  4.  Hyperlipidemia.  5.  Chronic obstructive pulmonary disease.   PROCEDURES PERFORMED:  Cardiac catheterization and percutaneous coronary  intervention by Dr. Randa Evens November 30, 2005.  Please see the  dictated note for complete details.  Patient had left anterior descending in-  stent restenosis of 98% which was treated with CYPHER stent with 0% residual  stenosis.  The ejection fraction was 45% with apical dyskinesis,  anterolateral hypokinesis.   BRIEF HISTORY:  This 55 year old male is followed by Dr. Dorethea Clan in the  [place] office and was seen in follow-up on December 01, 2005.  He has a  history as noted above.  The patient had had a recent echocardiogram  revealing an EF of 25% and progressive dilatation of the left ventricle.  The patient complained of worsening exercise tolerance and increasing  shortness of breath and general malaise.  He denied any chest discomfort.  It was decided to bring  the patient in for cardiac catheterization to  further evaluate his symptoms.   HOSPITAL COURSE:  The patient was admitted on November 30, 2005 for cardiac  catheterization.  This was performed by Dr. Randa Evens.  The results are  noted above.  He tolerated procedure well and had no immediate  complications.  He would need to remain on Plavix and aspirin indefinitely.  He was doing well on the morning of December 01, 2005.  His groin was  without hematoma or bruit.  His hemoglobin did drop to 9.6.  Repeat  hemoglobin was 9.8.  The patient would need to hold his Metformin for 72  hours post catheterization.   ALLERGIES:  VANCOMYCIN.   FOLLOW-UP:  With the physician assistant for Dr. Dorethea Clan in [place] on  March 15 at 1 p.m.   DISCHARGE DIAGNOSES:  1.  Aspirin 325 mg daily.  2.  Plavix 75 mg daily.  3.  Glucophage 500 mg b.i.d. to be restarted on December 04, 2005.  4.  Multivitamin with iron.  5.  Lasix 40 mg b.i.d.  6.  Zestril 5 mg daily.  7.  Lantus insulin 15 units q.h.s.  8.  Prevacid 30 mg a day.  9.  TriCor 145 mg daily.  10. Cymbalta as directed.  11. DuoNeb as needed.  12. Nitroglycerin p.r.n. chest pain.  13. Proventil/levalbuterol p.r.n.  14. Coreg 12.5 mg b.i.d.      Tereso Newcomer, P.A.      Salvadore Farber, M.D. [facility]  Electronically Signed    SW/MEDQ  D:  12/01/2005  T:  12/01/2005  Job:  [id number]   cc:   Vida Roller, M.D.  Fax: [phone]   Oneal Deputy. Juanetta Gosling, M.D.  Fax: [phone]

## 2011-02-19 NOTE — Discharge Summary (Signed)
NAMEMARKISE, [name]                ACCOUNT NO.:  [id number]   MEDICAL RECORD NO.:  [id number]          PATIENT TYPE:  INP   LOCATION:  2906                         FACILITY:  [facility]   PHYSICIAN:  Samul Dada, M.D.DATE OF BIRTH:  [birth year]   DATE OF ADMISSION:  02/10/2005  DATE OF DISCHARGE:                                 DISCHARGE SUMMARY   REASON FOR CONSULTATION:  Erythrocytosis.   HISTORY OF PRESENT ILLNESS:  [name] is a 55 year old white male smoker,  admitted with chest pain on Feb 10, 2005, as well as exertional dyspnea for  which an EKG showed anterior/inferior ST-elevation, consistent with acute  anteroseptal MI. He underwent cardiac catheterization after being placed on  aspirin, heparin, Plavix, and Integrilin. Workup labs revealed an elevated  hemoglobin at 21.4 on Feb 10, 2005, with a hematocrit of 61.9 on admission.  Currently his H&H is 19.8 and 58.1, respectively. We were called for  evaluation, in order to assess the patient with recommendations. Per patient  report, no erythrocytosis of polycythemia vera is present in his family  history.   PAST MEDICAL HISTORY:  1.  Erythrocytosis as above.  2.  Hypertension.  3.  Diabetes mellitus.  4.  Hyperlipidemia.  5.  History of tobacco abuse, ongoing.  6.  History of alcohol habituation.  7.  Acute anteroseptal MI on Feb 10, 2005, status post cardiac      catheterization.  8.  COPD.   SURGERIES:  1.  Right inguinal hernia repair in 2003.  2.  Left ankle repair, post fracture.  3.  Cardiac catheterization, Feb 10, 2005, Dr. Samule Ohm.   ALLERGIES:  VANCOMYCIN.   CURRENT MEDICATIONS:  1.  Aspirin 325 mg daily.  2.  Lipitor 80 mg p.o. q.h.s.  3.  Librium 12.5/25 q.i.d.  4.  Plavix 75 mg daily.  5.  Lovenox 40 mg injection daily.  6.  Folic acid 0.5 daily.  7.  NovoLog as directed.  8.  Avapro 75 mg daily.  9.  Lopressor 12.5 mg t.i.d.  10. Actos 30 mg p.o. daily.  11. Tylenol p.r.n.  12. Integrilin  75 mg IV on May 10th and May 11th at 14:00 hours.   REVIEW OF SYSTEMS:  Remarkable for a 10-pound weight loss over the last  months, complaining of decreased appetite, dyspnea on exertion, chronic  cough with minimal sputum production.  His chest pain is currently resolved.  He also has diffuse bilateral lower quadrant abdominal pain with frequent  small volume blood in stool believed to be secondary to hemorrhoids. He also  has some numbness and tingling due to peripheral neuropathy secondary to  diabetes, and occasional myalgias. No pleuritic changes.   FAMILY HISTORY:  Mother alive with CAD. Father deceased with CAD at age 5.  One brother alive with a history of MI at age 61. No history of blood  dyscrasias in the family.   SOCIAL HISTORY:  The patient is married. He has one son, age 46, in good  health. He is on disability due to his increasing shortness of breath. He  smokes  two packs of tobacco a day for at least 35 years. He lives in  [place]. He is [employer]. He drinks 12-24 beers a week.   PHYSICAL EXAMINATION:  GENERAL: This is a 55 year old white male, flat  affect, in no acute distress, alert and oriented times three.  VITAL SIGNS: Blood pressure 90/56, pulse 84, respirations 20, temperature  98.5, pulse oximetry 97% on two liters.  HEENT: Moderately ill appearing, atraumatic.  PERRLA. No thrush in the oral  cavity. Poor dentition. No visible ulcerations in his oral mucosa.  NECK: Supple with no cervical or supraclavicular masses.  LUNGS: Essentially clear to auscultation bilaterally, with mild atelectatic  sounds. No axillary masses.  CARDIOVASCULAR: Regular rate and rhythm with a soft short systolic murmur.  No rubs or gallops.  ABDOMEN: Soft. Slightly tender in both lower quadrants, with on specific  masses found. No palpable spleen or liver.  GU/RECTAL EXAM: Deferred.  EXTREMITIES: No clubbing, cyanosis, or edema.  SKIN: With discoloration secondary to neuropathic,  diabetic changes, no  bruising or petechia.  His skin has an erythematous color, with no rashes  seen.  NEUROLOGIC: Essentially nonfocal.   LABORATORY DATA:  Currently hemoglobin 19.8, hematocrit 58.1, white count  12.7, platelet count 186,000, neutrophils 7.3, MCV 94.2. Sodium 135,  potassium 3.3, BUN 5, creatinine 0.8, glucose 130, total bilirubin 0.5,  alkaline phosphatase 98, AST 202 secondary to myocardial injury.  ALT 41,  total protein 6.3, albumin 3.1, calcium 9.5.  Of note, as of February 2003,  his hemoglobin and hematocrit were 18.9 and 54.6, respectively. Normal white  count of 7.9 and platelets were 209,000. His PCO2 at the time was 53. A 2-3  echo is pending.   ASSESSMENT/PLAN:  Dr. Arline Asp has seen and evaluated the patient and the  chart has been reviewed. He has striking erythrocytosis, with no prior  history, but with no medical care since at least 15 years.  The patient was  in the hospital at [facility] about 18 years ago after surgery, for  fractured ankle. We will try to obtain these records from then. Will help  exclude abnormal hemoglobin affinity for oxygen. Will check ABGs with  carboxyhemoglobin. The patient is a two pack a day smoker. We need to rule  out CO exposure. Will also check ultrasound of the spleen which is not  palpable during his physical exam. Will also check liver and kidneys to rule  out tumors. Chest x-ray was negative. At this point the most likely etiology  of his elevated hemoglobin and hematocrit is polycythemia vera despite  normal white count and platelets. In view of adverse hemodynamic effects and  suboptimal tissue perfusion associated with markedly elevated hemoglobin and  hematocrit, will proceed with cautious phlebotomy  especially in view of  decreased blood pressure around 90s over 60s.  The patient had a half unit  of phlebotomy today, without problems. Will try to get a hematocrit less or equal than 45%, to improve  tissue  perfusion. Further workup can be done at a later date.   Thank you very much for allowing [place] the opportunity to participate in the  care of [name].       SW/MEDQ  D:  02/15/2005  T:  02/15/2005  Job:  [id number]   cc:   Ramon Dredge L. Juanetta Gosling, M.D.  [address]  [place]  [place] [address]  Fax: [phone]

## 2011-02-19 NOTE — Group Therapy Note (Signed)
[name], [name]                ACCOUNT NO.:  [id number]   MEDICAL RECORD NO.:  [id number]          PATIENT TYPE:  INP   LOCATION:  IC04                          FACILITY:  [facility]   PHYSICIAN:  [name]. Juanetta Gosling, M.D.DATE OF BIRTH:  Feb 19, 1956   DATE OF PROCEDURE:  09/04/2005  DATE OF DISCHARGE:                                   PROGRESS NOTE   PROBLEM LIST:  1.  Coronary disease.  2.  Congestive heart failure, status post myocardial infarction.  3.  Status post pulmonary embolus.  4.  Diabetes.  5.  Postoperative colon resection.   SUBJECTIVE:  [name] says he is doing well and feels great.  He has no  new complaints.   OBJECTIVE:  VITAL SIGNS:  Pulse 94, blood pressure 116/77, O2 saturations  98%.  CHEST:  His chest is clear.  HEART:  Heart is regular. He did have a run of ventricular tachycardia and  he has required 2 units of packed red blood cells.   His exam today shows his white count is 9600, hemoglobin 27, platelets 183.  His BMET shows potassium is 3.4.   ASSESSMENT:  Overall he is doing well.   PLAN:  Plan is to continue his treatments and I will be gone tomorrow, but  back on December 4.      [name]. Juanetta Gosling, M.D.  Electronically Signed     ELH/MEDQ  D:  09/04/2005  T:  09/04/2005  Job:  [id number]

## 2011-02-19 NOTE — Consult Note (Signed)
NAMEZAKIR, HENNER                ACCOUNT NO.:  [id number]   MEDICAL RECORD NO.:  [id number]          PATIENT TYPE:  INP   LOCATION:  IC04                          FACILITY:  [facility]   PHYSICIAN:  Vida Roller, M.D.   DATE OF BIRTH:  02/20/1956   DATE OF CONSULTATION:  08/23/2005  DATE OF DISCHARGE:                                   CONSULTATION   PRIMARY CARE PHYSICIAN:  Oneal Deputy. Juanetta Gosling, M.D.   HISTORY OF PRESENT ILLNESS:  Mr. Guercio is a man I follow with coronary  artery disease who is status post an acute myocardial infarction with  stenting of his LAD in May 2006.  He was found to have colon cancer by  colonoscopy and was referred for surgical therapy.  I saw him in  preoperative consult prior to his surgery and made recommendations for his  medical therapy.  Dr. Lovell Sheehan has asked that we follow this patient while in  the hospital.  He is postoperative day #1 from his total colectomy and is  doing well without significant complaints.   PAST MEDICAL HISTORY:  1.  Coronary artery disease.  He presented on Feb 10, 2005, with an acute ST      segment elevation and anterior wall myocardial infarction.  He was taken      directly to the cath lab by Dr. Geralynn Rile and revascularization of his      left anterior descending coronary artery was performed with a bare metal      stent.  His left ventricular systolic function was preserved, and he is      well postoperatively.  2.  Hypertension.  3.  Insulin-dependent diabetes mellitus.  4.  Chronic obstructive pulmonary disease secondary to smoking.  5.  Significant alcohol history.   PAST SURGICAL HISTORY:  1.  Right inguinal herniorrhaphy.  2.  Left ankle surgery in the past.   CURRENT MEDICATIONS:  1.  Asmanex two puffs every evening.  2.  Lantus insulin 15 units subcu every morning.  3.  Coreg 12.5 mg b.i.d.  4.  Nitroglycerin p.r.n.  5.  Cymbalta 30 mg b.i.d.  6.  Aspirin 325 mg once daily.  7.  Metformin 500 mg b.i.d.  8.  Lisinopril 5 mg every evening.  9.  Lasix 40 mg b.i.d.  10. Omeprazole 20 mg once daily.  11. Albuterol two puffs q.6 h p.r.n.   ALLERGIES:  Vancomycin.   HABITS:  Still smokes about a pack a week. Continues to drink alcohol on a  daily basis.  He denies any active substance abuse.   FAMILY HISTORY:  Noncontributory.   REVIEW OF SYSTEMS:  Denies any headache or sinus tenderness.  No changes in  his vision.  No sinus tenderness of discharge.  No difficulty swallowing.  No thyroid problems.  No shortness of breath beyond his baseline.  No  productive cough.  No hematemesis.  No hemoptysis.  No melena or  hematochezia.  Denies any chest discomfort, syncope, PND, orthopnea or lower  extremity edema.  No significant abdominal pain.  Although, he is recently  postoperative now.  His lower extremities are without musculoskeletal  problems.  He has no edema.  His skin is without significant lesions.  The  remainder of the review of systems is negative.   PHYSICAL EXAMINATION:  GENERAL APPEARANCE:  Thin, white male in no apparent  distress.  He is alert and oriented x4.  He is postop.  He has an NG tube in  his nose, but otherwise, he is feeling reasonably well.  VITAL SIGNS:  Pulse 105, blood pressure 118/73, respirations 18, afebrile.  HEENT:  Unremarkable.  NECK:  Supple.  No jugular venous distention or carotid bruits.  CHEST:  Decreased breath sounds at the bases, but otherwise, reasonably good  air movement.  CARDIOVASCULAR:  Regular with no murmur.  Point of maximal impulse is  nondisplaced.  First and second heart sounds are normal.  ABDOMEN:  Quite postoperative.  EXTREMITIES:  Lower extremities are without cyanosis, clubbing or edema.  Pulses are 1+.  He has no bruits.   STUDIES:  Electrocardiogram shows a right bundle branch block with no ST-T  wave changes, essentially unchanged from his preoperative electrocardiogram  with the exception that he is in sinus tachycardia at  a rate of about 120.   LABORATORY DATA:  White blood cell count 11.3, H&H 11 and 31, platelets  223,000.  Sodium 142, potassium 3.7, chloride 108, bicarbonate 28, BUN 6,  creatinine 0.7, blood sugars 133.  Liver function studies done on November  27 are within normal limits.  Calcium 8.3.   IMPRESSION:  This is a gentleman with a number of issues:  1.  Colon cancer.  He is status post resection postop day #1.  2.  Coronary artery disease status post acute ST segment elevation,      myocardial infarction in March with a bare metal stent to his LAD with      preserved LV systolic function.  3.  Hypertension with good control.  4.  Diabetes mellitus which is moderately poorly controlled.  5.  History of alcoholism and tobacco abuse, both of which are ongoing.  6.  History of  chronic obstructive pulmonary disease which is mild but      probably more advanced for his age than would be suspected.  He is      currently off aspirin and Plavix for his surgery.  We discussed this      with Dr. Lovell Sheehan and the issues regarding stent thrombosis are not      trivial, but he is well past the window for a bare metal stent, and we      feel that it is very likely that he will tolerate this well.  He had a      myocardial perfusion imaging study done in July which showed no evidence      of myocardial ischemia, and his ejection fraction at that time was      37%.  So, the feeling is that he will probably tolerate the surgery      well.  We will use a little IV Metoprolol to bring his heart rate down      under reasonable control and we will restart his p.o. medications once      he is able to take p.o.  We will follow him with you.      Vida Roller, M.D.  Electronically Signed     JH/MEDQ  D:  09/02/2005  T:  09/02/2005  Job:  [id number]

## 2011-02-19 NOTE — Procedures (Signed)
NAMERAPHEAL, MASSO                ACCOUNT NO.:  [id number]   MEDICAL RECORD NO.:  [id number]          PATIENT TYPE:  OUT   LOCATION:  RESP                          FACILITY:  [facility]   PHYSICIAN:  [name]. Juanetta Gosling, M.D.DATE OF BIRTH:  21-Aug-1956   DATE OF PROCEDURE:  DATE OF DISCHARGE:  04/16/2005                              PULMONARY FUNCTION TEST   1.  Spirometry shows a moderate ventilatory defect with evidence of airflow      obstruction.  2.  Total lung capacity is also decreased mildly, suggesting a separate      restrictive change.  3.  DLCO is mild to moderately reduced.  4.  There is no significant bronchodilator response.       ELH/MEDQ  D:  04/19/2005  T:  04/19/2005  Job:  [id number]

## 2011-02-19 NOTE — Assessment & Plan Note (Signed)
[facility]                         [place] C[facility]IOLOGY OFFICE NOTE   [name], [name]                       MRN:          [id number]  DATE:06/15/2006                            DOB:          January 23, 1956    [name] comes back today to discuss the results of his stress perfusion  study.  This shows extensive scar in the septum, anterior apical, inferior  apical, and inferior segments which was expected.  He has no sinographic  evidence for myocardial ischemia.  His ejection fraction was 31%.   He is still having some chest discomfort which is pretty much constant.  He  has taken one nitroglycerin.  I told him I doubt this is coronary.   He would like to get all of his teeth extracted by Dr. Bradly Chris.  Dr. Dorethea Clan,  who was his former cardiologist, states in his note of May 31, 2006 that  this should not be done on an outpatient basis.  In addition, he should not  stop his aspirin or his Plavix, having had a drug-eluting stent in February  of this year.   I have reinforced this with the patient today.  I will send this note to Dr.  Bradly Chris.  We will plan on seeing him back in six months.   If Dr. Bradly Chris decides to do him in the hospital, I will be glad to consult  perioperatively.                                   [name] C. Daleen Squibb, [name], [name]   TCW/MedQ  DD:  06/15/2006  DT:  06/16/2006  Job #:  [id number]   cc:   Dorthula Matas, D.D.S.  [name]. Juanetta Gosling, M.D.

## 2011-02-19 NOTE — Procedures (Signed)
[facility]  Patient:    [name], [name] Visit Number: [id number] MRN: [id number]          Service Type: MED Location: 3A A306 01 Attending Physician:  Dalia Heading Dictated by:   Kari Baars, M.D. Proc. Date: 11/29/01 Admit Date:  11/30/2001                            EKG Interpretations  TIME: 1442, November 29, 2001  The rhythm is a sinus tachycardia with a rate of about 130.  There is right axis deviation and what appears to be an incomplete right bundle branch block. The computer has read left atrial enlargement.  I am not certain of that diagnosis by EKG criteria.  There is minimal ST elevation inferiorly which I believe is related to the incomplete bundle branch block.  IMPRESSION:  Abnormal electrocardiogram. Dictated by:   Kari Baars, M.D. Attending Physician:  Dalia Heading DD:  11/29/01 TD:  11/30/01 Job: [id number]

## 2011-02-19 NOTE — Group Therapy Note (Signed)
[name], [name]                ACCOUNT NO.:  [id number]   MEDICAL RECORD NO.:  [id number]          PATIENT TYPE:  INP   LOCATION:  IC04                          FACILITY:  [facility]   PHYSICIAN:  [name]. Juanetta Gosling, M.D.DATE OF BIRTH:  12-11-1955   DATE OF PROCEDURE:  09/03/2005  DATE OF DISCHARGE:                                   PROGRESS NOTE   SUBJECTIVE:  [name] says he feels okay and he has no new complaints, but  he did quite confused during the night last night. He has pulled his NG tube  out. He did have a bowel movement today, however. He says he would like to  see about getting some of the other tubes out. He is feels better and says  everything is going okay. As mentioned, he has not had any abdominal pain.  He did have a bowel movement.   OBJECTIVE:  VITAL SIGNS:  His exam shows his pulse is 107, blood pressure  102/65. O2 saturation 86%, right now is wearing O2, he was up in the 90s  just a minute ago. Respirations about 21. His white count 8,700, hemoglobin  8.5, platelets 186. His BMET shows blood sugar is 186. Electrolytes were  normal.   ASSESSMENT:  He seems much better.   PLAN:  Plan is to continue with his current treatments medications. No  changes today.      [name]. Juanetta Gosling, M.D.  Electronically Signed     ELH/MEDQ  D:  09/03/2005  T:  09/03/2005  Job:  [id number]

## 2011-02-19 NOTE — Discharge Summary (Signed)
NAMECAID, RADIN                ACCOUNT NO.:  [id number]   MEDICAL RECORD NO.:  [id number]          PATIENT TYPE:  INP   LOCATION:  2017                         FACILITY:  [facility]   PHYSICIAN:  Salvadore Farber, M.D. LHCDATE OF BIRTH:  Mar 17, 1956   DATE OF ADMISSION:  02/10/2005  DATE OF DISCHARGE:  02/23/2005                                 DISCHARGE SUMMARY   PROCEDURES:  1.  Cardiac catheterization.  2.  Coronary arteriogram.  3.  Left ventriculogram.  4.  2-D echocardiogram  5.  Therapeutic phlebotomy.   DISCHARGE DIAGNOSES:  1.  Acute myocardial infarction, status post PTCA and Multilink Vision stent      to the left anterior descending, single vessel disease, EF 58%.  2.  Status post echocardiogram with an EF of 35-45%, akinesis of the      periapical wall and mid distal septal wall with the possibility of a      laminated thrombus along the apical wall of the left ventricle  3.  Anticoagulation with Coumadin started this admission secondary to a of      thrombus  4.  Hypertension.  5.  Allergy to VANCOMYCIN  6.  Diabetes with a hemoglobin A1c of 9.5 this admission.  7.  Dyslipidemia with a total cholesterol 115, triglycerides 259, HDL 34,      LDL 29.  8.  Polycythemia vera with a hematocrit of 21.4 and a hemoglobin of 63 on      admission, status post evaluation by Dr. Arline Asp with the daily      phlebotomy.  9.  History of alcohol habituation.  10. History of tobacco use, the patient says he will quit.  11. Hypertension.  12. Status post right inguinal hernia repair.  13. Left ankle fracture repair  14. Family history of coronary artery disease in his brother who had an      myocardial infarction at age 32.   HOSPITAL COURSE:  Mr. Auker is a 55 year old male with no known history of  coronary artery disease. He has a six month history of exertional dyspnea  and developed unrelenting substernal chest pain at 11  a.m. on the day of  admission. He went to [facility] emergency room where his EKG was indicating  an acute anterior MI. He was transferred to the  [facility] and  taken emergently to the cath lab.   The cardiac catheterization demonstrates single-vessel coronary artery  disease and a 95% LAD stenosis was reduced to zero with a Multilink vision  bare metal stent. His EF at that time was 58%  but an echocardiogram  performed on Feb 11, 2005 showed left ventricular dysfunction and an apical  thrombus. He was anticoagulated with Lovenox and started on Coumadin. His  Coumadin was slow to increase and at one point he was getting 20 milligrams  a day. He is being discharged on 15 mg a day with an INR of 2.5. He is to  get off follow-up pro time in the office in 2 days with further management  as outpatient.   Dr.  Murinson saw Mr. Carn for erythrocytosis and an initial hemoglobin of  greater than 21. He received phlebotomy throughout his hospital stay and at  discharge his hemoglobin was 15.6 with a hormone hematocrit of 44.8. The  diagnosis was likely polycythemia vera and Dr. Mamie Levers office has been  contacted to advise the discharge. They will contact him for outpatient  follow-up.   As part of his evaluation, a lipid profile was performed. The lipid profile  showed dyslipidemia with a decreased HDL of and 34 and an LDL of 29. Dr.  Samule Ohm evaluated this and felt that no Statin was needed.   Mr. Holzhauer has a two pack a day smoker and king approximately a case of beer  a week. He was counseled on alcohol cessation and smoking cessation. He was  given a Librium protocol for detox and had no symptoms of DT's.   Mr. Bohlken was a known diabetic and hemoglobin A1c was elevated at 9.5. He  had a nutrition consult and is getting a Glucometer. He is to check his  blood sugars b.i.d. Lantus and Glucophage were added to his medication  regimen. He is to follow up with Dr. Juanetta Gosling.   Mr. Lipa was seen by cardiac rehab and  ambulating without chest pain or  shortness of breath. He was doing well.   By Feb 23, 2005,  Mr. Eckley had no chest pain or shortness of breath. He  was considered stable for discharge with outpatient follow-up arranged.   DISCHARGE INSTRUCTIONS:  1.  His activity level is to include no driving for five days and no      strenuous activity until cleared by MD.  2.  He is to call the office for problems with the cath site.  3.  He is to stick to a low-fat diabetic diet.  4.  He is not to use alcohol or tobacco.  5.  He is to follow up at the Coumadin clinic on May25 at 4:30 and is to      follow up with Amy Mercy Riding P.A.-C. for Dr. Vida Roller on May31 at      1:30. He is to follow up with Dr. Juanetta Gosling next week and he is to follow      up with Dr. Arline Asp and the office will call.   DISCHARGE MEDICATIONS:  1.  Coumadin 10 mg 1 1/2 tablet q.d. or as directed.  2.  Actos 30 mg q.d.  3.  Multivitamins with iron q.d.  4.  Coated aspirin 325 mg q.d.  5.  Plavix 75 milligrams q.d.  6.  Nitroglycerin sublingual p.r.n.  7.  Glucophage 500 mg b.i.d.  8.  Aldactone 25 mg q.d.  9.  Zestril 10 mg q.d.  10. Coreg 12.5 mg b.i.d.  11. Lasix 40 mg b.i.d.  12. Lantus 10 units q.p.m.      RB/MEDQ  D:  02/23/2005  T:  02/23/2005  Job:  [id number]   cc:   Ramon Dredge L. Juanetta Gosling, M.D.  [address]  [place]  [place] [address]  Fax: [phone]   Vida Roller, M.D.  Fax: [phone]   Samul Dada, M.D.  [address] [facility]  [place]  [place] [address]  Fax: [phone]

## 2011-02-19 NOTE — Group Therapy Note (Signed)
[name], PARLATO                ACCOUNT NO.:  [id number]   MEDICAL RECORD NO.:  [id number]          PATIENT TYPE:  INP   LOCATION:  IC04                          FACILITY:  [facility]   PHYSICIAN:  [name]. Juanetta Gosling, M.D.DATE OF BIRTH:  1955-10-24   DATE OF PROCEDURE:  DATE OF DISCHARGE:                                   PROGRESS NOTE   Mr. Kings had his colectomy yesterday. Thus far he has done well with no  new complaints. He says he wants to leave the intensive care unit. I told  him we will have to leave that decision to Dr. Lovell Sheehan.   His O2 sat is 96%. Blood pressure is in the 120s. His chest is clear. He  looks much more comfortable than yesterday. Heart is regular. Abdomen is  soft.   Hemoglobin 11.9 yesterday. Sodium was 140, potassium 3.9, glucose 119.   ASSESSMENT:  He is much improved.   Plan is to continue his current medications and treatments. As mentioned,  the decision as to whether he is going to be able to move out of the  intensive care unit will be a surgical decision, but he looks very good. His  blood sugar has been good today, 133.      [name]. Juanetta Gosling, M.D.  Electronically Signed     ELH/MEDQ  D:  09/02/2005  T:  09/02/2005  Job:  [id number]

## 2011-02-19 NOTE — H&P (Signed)
[facility]  Patient:    [name], LEGATE Visit Number: [id number] MRN: [id number]          Service Type: DSU Location: DAY Attending Physician:  Dalia Heading Dictated by:   Franky Macho, M.D. Adm. Date:  11/28/01   CC:         Franky Macho, M.D.  Kari Baars, M.D.  Patients chart   History and Physical  DATE OF BIRTH: 1956-04-09.  CHIEF COMPLAINT:  Right inguinal hernia.  HISTORY OF PRESENT ILLNESS:  The patient is a 55 year old white male who is referred for evaluation and treatment of a right inguinal hernia.  He has had bilateral inguinal hernias for many years now but lately the right inguinal hernia has become more pronounced and is made worse with straining.  There is no pain at rest.  No nausea or vomiting have been noted.  PAST MEDICAL HISTORY:  Hypertension and noninsulin-dependent diabetes mellitus.  PAST SURGICAL HISTORY:  Left ankle surgery in the past.  CURRENT MEDICATIONS:  Glucotrol XL 5 mg p.o. q.d. and a blood pressure pill.  ALLERGIES:  VANCOMYCIN.  REVIEW OF SYSTEMS:  The patient denies any other cardiopulmonary difficulties or bleeding disorders.  He does smoke two and a half pack of cigarettes a day. He does drink two to three beers a day.  PHYSICAL EXAMINATION:  GENERAL:  The patient is a well-developed, well-nourished white male in no acute distress.  VITAL SIGNS:  He is afebrile.  Vital signs are stable.  LUNGS:  Clear to auscultation with equal breath sounds bilaterally.  HEART:  Regular rate and rhythm, without S3, S4, or murmurs.  ABDOMEN:  Soft, nontender, and nondistended.  Partially reducible right inguinal hernia is noted.  The left inguinal hernia is not as prominent.  GENITOURINARY:  Within normal limits.  IMPRESSION:  Partially incarcerated right inguinal hernia, not strangulated.  PLAN:  The patient is scheduled for right inguinal herniorrhaphy on December 01, 2001.  The risks and  benefits of the procedure including bleeding, infection, strangulation, and the possibility of recurrence were fully explained to the patient and he gave informed consent. Dictated by:   Franky Macho, M.D. Attending Physician:  Dalia Heading DD:  11/28/01 TD:  11/28/01 Job: [id number]

## 2011-02-19 NOTE — Op Note (Signed)
[name], [name]                ACCOUNT NO.:  [id number]   MEDICAL RECORD NO.:  [id number]          PATIENT TYPE:  INP   LOCATION:  IC04                          FACILITY:  [facility]   PHYSICIAN:  Dalia Heading, M.D.  DATE OF BIRTH:  1956/08/18   DATE OF PROCEDURE:  09/01/2005  DATE OF DISCHARGE:                                 OPERATIVE REPORT   PREOPERATIVE DIAGNOSIS:  Colon carcinoma.   POSTOPERATIVE DIAGNOSIS:  Colon carcinoma.   PROCEDURE:  Total colectomy, flexible sigmoidoscopy.   SURGEON:  Dr. Franky Macho.   ASSISTANT:  Dr. Arna Snipe.   ANESTHESIA:  General endotracheal.   INDICATIONS:  The patient is a 55 year old white male who was found to have  a cecal mass as well as a colon carcinoma in situ at 15 cm from the anus.  The patient now comes to the operating room for a total colectomy. Risks and  benefits of the procedure including bleeding, infection, cardiopulmonary  difficulties, and the possibly of a blood transfusion were fully explained  to the patient, who gave informed consent.   PROCEDURE NOTE:  The patient was placed in the lithotomy position after  induction of general endotracheal anesthesia. Surgical site confirmation was  performed. Flexible sigmoidoscopy was done to 50 cm. The previously  polypectomy done at 15 cm could not be found. Everything had healed over.  Distal to the 20 cm mark from the anus, there was no evidence of cancer or a  polypoid lesion. All air was then evacuated from the descending colon,  sigmoid colon, and rectum prior to removal of the endoscope. The abdomen and  perineum were prepped and draped in the usual sterile technique with  Betadine.   A midline incision was made from just above the umbilicus to the suprapubic  region. The peritoneal cavity was entered into without difficulty. The  nasogastric tube was noted be in appropriate position in the stomach. The  liver and gallbladder were within normal limits. No  abnormal lesions were  noted. The small bowel was within normal limits. A fullness was noted in the  cecum. It was elected to proceed with a total colectomy. The ascending and  descending colons were mobilized along their peritoneal reflections. A  takedown of the splenic flexure was performed using the harmonic scalpel.  The omentum was freed away from the transverse colon using the harmonic  scalpel. A GIA stapler was placed across the terminal ileum and fired. The  colon mesentery was then divided using the harmonic scalpel, and any major  blood vessels were ligated using doubled 2-0 silk ties. A TA stapler was  placed across the colorectal region. The specimen was then removed from the  operative field. A side-to-side ileorectal anastomosis was then performed  using a GIA stapler. The enterotomy was closed using a TA stapler. The  staple line was bolstered using 3-0 silk sutures. A large anastomosis was  found. The mesenteric defect was closed using a 2-0 chromic gut interrupted  suture. The abdominal cavity was then copiously irrigated with normal  saline. All fluid was evacuated from  the abdominal cavity, and the bowel was  returned to the abdominal cavity in an orderly fashion. All operating room  personnel then changed their gloves.   The fascia was reapproximated using a looped 0 Novofil running suture. The  subcutaneous layer was irrigated normal saline, and the skin was closed  using staples. Betadine ointment and dry sterile dressing applied.   All tape and needle counts were correct at the end of the procedure. The  patient was extubated in the operating room and went back to recovery room  awake in stable condition.   COMPLICATIONS:  None.   SPECIMEN:  Colon.   BLOOD LOSS:  300 cc.      Dalia Heading, M.D.  Electronically Signed     MAJ/MEDQ  D:  09/01/2005  T:  09/02/2005  Job:  [id number]   cc:   R. Roetta Sessions, M.D.  [address]  [place]  [place]  [address]   Ramon Dredge L. Juanetta Gosling, M.D.  Fax: [phone]   Vida Roller, M.D.  Fax: [phone]

## 2011-02-19 NOTE — Discharge Summary (Signed)
[facility]  Patient:    [name], [name] Visit Number: [id number] MRN: [id number]          Service Type: MED Location: 3A A306 [id number] Attending Physician:  Dalia Heading Dictated by:   Franky Macho, M.D. Admit Date:  11/30/2001 Discharge Date: 12/02/2001   CC:         Kari Baars, M.D.   Discharge Summary  HOSPITAL COURSE SUMMARY:  The patient is a 55 year old white male who was admitted to the hospital on November 30, 2001, with an incarcerated right inguinal hernia as well as uncontrolled diabetes. He was started on subcutaneous insulin in order to correct his hyperglycemia which was greater than 400. Once this was controlled, he was taken to the operating room and underwent a right inguinal herniorrhaphy. He tolerated the procedure well. His postoperative course has been unremarkable. His diet was advanced without difficulty.  DISPOSITION:  The patient is being discharged to home on December 02, 2001, in good and improving condition.  DISCHARGE INSTRUCTIONS:  The patient is to follow up with Dr. Franky Macho on December 12, 2001.  DISCHARGE MEDICATIONS: 1. Vicodin 1-2 tablets p.o. q.4h. p.r.n. pain, dispense 15, no refills. 2. Ciprofloxacin 500 mg p.o. b.i.d. x5 days. 3. Glucotrol XL 5 mg 1 tablet p.o. q.d.  PRINCIPAL DIAGNOSIS: 1. Incarcerated right inguinal hernia. 2. Uncontrolled diabetes mellitus with hyperglycemia. 3. Productive cough.  PRINCIPAL PROCEDURE:  Right inguinal herniorrhaphy on December 01, 2001. Dictated by:   Franky Macho, M.D. Attending Physician:  Dalia Heading DD:  12/02/01 TD:  12/02/01 Job: [id number]

## 2011-07-22 LAB — CBC
MCV: 87.8
Platelets: 191
RBC: 4.15 — ABNORMAL LOW
WBC: 7.9

## 2011-10-05 DIAGNOSIS — Z8701 Personal history of pneumonia (recurrent): Secondary | ICD-10-CM

## 2011-10-05 HISTORY — DX: Personal history of pneumonia (recurrent): Z87.01

## 2012-01-15 ENCOUNTER — Emergency Department (HOSPITAL_COMMUNITY)
Admission: EM | Admit: 2012-01-15 | Discharge: 2012-01-15 | Disposition: A | Discharge status: Home / Self Care | Payer: Medicare Other | Attending: Emergency Medicine | Admitting: Emergency Medicine

## 2012-01-15 ENCOUNTER — Encounter (HOSPITAL_COMMUNITY): Payer: Self-pay | Admitting: *Deleted

## 2012-01-15 ENCOUNTER — Emergency Department (HOSPITAL_COMMUNITY): Payer: Medicare Other

## 2012-01-15 DIAGNOSIS — J4489 Other specified chronic obstructive pulmonary disease: Secondary | ICD-10-CM | POA: Insufficient documentation

## 2012-01-15 DIAGNOSIS — R0602 Shortness of breath: Secondary | ICD-10-CM | POA: Insufficient documentation

## 2012-01-15 DIAGNOSIS — R059 Cough, unspecified: Secondary | ICD-10-CM | POA: Insufficient documentation

## 2012-01-15 DIAGNOSIS — I10 Essential (primary) hypertension: Secondary | ICD-10-CM | POA: Insufficient documentation

## 2012-01-15 DIAGNOSIS — E119 Type 2 diabetes mellitus without complications: Secondary | ICD-10-CM | POA: Insufficient documentation

## 2012-01-15 DIAGNOSIS — R05 Cough: Secondary | ICD-10-CM | POA: Insufficient documentation

## 2012-01-15 DIAGNOSIS — F17200 Nicotine dependence, unspecified, uncomplicated: Secondary | ICD-10-CM | POA: Insufficient documentation

## 2012-01-15 DIAGNOSIS — R609 Edema, unspecified: Secondary | ICD-10-CM | POA: Insufficient documentation

## 2012-01-15 DIAGNOSIS — J449 Chronic obstructive pulmonary disease, unspecified: Secondary | ICD-10-CM | POA: Insufficient documentation

## 2012-01-15 DIAGNOSIS — J189 Pneumonia, unspecified organism: Secondary | ICD-10-CM | POA: Insufficient documentation

## 2012-01-15 DIAGNOSIS — I509 Heart failure, unspecified: Secondary | ICD-10-CM

## 2012-01-15 DIAGNOSIS — M129 Arthropathy, unspecified: Secondary | ICD-10-CM | POA: Insufficient documentation

## 2012-01-15 DIAGNOSIS — I2510 Atherosclerotic heart disease of native coronary artery without angina pectoris: Secondary | ICD-10-CM | POA: Insufficient documentation

## 2012-01-15 HISTORY — DX: Unspecified osteoarthritis, unspecified site: M19.90

## 2012-01-15 HISTORY — DX: Chronic obstructive pulmonary disease, unspecified: J44.9

## 2012-01-15 HISTORY — DX: Essential (primary) hypertension: I10

## 2012-01-15 LAB — BASIC METABOLIC PANEL
BUN: 7 mg/dL (ref 6–23)
Calcium: 9.3 mg/dL (ref 8.4–10.5)
Creatinine, Ser: 0.95 mg/dL (ref 0.50–1.35)
GFR calc Af Amer: >90 mL/min (ref >90)
GFR calc non Af Amer: >90 mL/min (ref >90)
Glucose, Bld: 176 mg/dL — ABNORMAL HIGH (ref 70–99)
Potassium: 4 mEq/L (ref 3.5–5.1)

## 2012-01-15 LAB — DIFFERENTIAL
Basophils Relative: 0 % (ref 0–1)
Eosinophils Absolute: 0.1 10*3/uL (ref 0.0–0.7)
Eosinophils Relative: 1 % (ref 0–5)
Lymphs Abs: 1 10*3/uL (ref 0.7–4.0)
Monocytes Relative: 6 % (ref 3–12)
Neutrophils Relative %: 82 % — ABNORMAL HIGH (ref 43–77)

## 2012-01-15 LAB — CBC
Hemoglobin: 12 g/dL — ABNORMAL LOW (ref 13.0–17.0)
MCH: 27.7 pg (ref 26.0–34.0)
MCHC: 31.8 g/dL (ref 30.0–36.0)
MCV: 87.1 fL (ref 78.0–100.0)
RBC: 4.33 MIL/uL (ref 4.22–5.81)

## 2012-01-15 MED ORDER — FUROSEMIDE 10 MG/ML IJ SOLN
80.0000 mg | Freq: Once | INTRAMUSCULAR | Status: AC
  Administered 2012-01-15: 80 mg via INTRAVENOUS
  Filled 2012-01-15: qty 4

## 2012-01-15 MED ORDER — IPRATROPIUM BROMIDE 0.02 % IN SOLN
0.5000 mg | Freq: Once | RESPIRATORY_TRACT | Status: AC
  Administered 2012-01-15: 0.5 mg via RESPIRATORY_TRACT
  Filled 2012-01-15: qty 2.5

## 2012-01-15 MED ORDER — DEXTROSE 5 % IV SOLN
1.0000 g | Freq: Once | INTRAVENOUS | Status: AC
  Administered 2012-01-15: 1 g via INTRAVENOUS
  Filled 2012-01-15: qty 10

## 2012-01-15 MED ORDER — AZITHROMYCIN 250 MG PO TABS: 250.0000 mg | ORAL_TABLET | Freq: Every day | ORAL | Status: AC

## 2012-01-15 MED ORDER — ALBUTEROL SULFATE (5 MG/ML) 0.5% IN NEBU
5.0000 mg | INHALATION_SOLUTION | Freq: Once | RESPIRATORY_TRACT | Status: AC
  Administered 2012-01-15: 5 mg via RESPIRATORY_TRACT
  Filled 2012-01-15: qty 1

## 2012-01-15 MED ORDER — DEXTROSE 5 % IV SOLN
500.0000 mg | Freq: Once | INTRAVENOUS | Status: DC
  Filled 2012-01-15: qty 500

## 2012-01-15 MED ORDER — NITROGLYCERIN 2 % TD OINT
1.0000 [in_us] | TOPICAL_OINTMENT | Freq: Once | TRANSDERMAL | Status: AC
  Administered 2012-01-15: 1 [in_us] via TOPICAL
  Filled 2012-01-15: qty 1

## 2012-01-15 NOTE — ED Provider Notes (Signed)
Medical screening examination/treatment/procedure(s) were conducted as a shared visit with non-physician practitioner(s) and myself.  I personally evaluated the patient during the encounter  SOB, hx of CHF, COPD, CAD.  Rhonchi R base.  90% with ambulation, denies SOB, CP.  Mild volume overload on exam.  Recommend admission with concurrent CAP.  Patient refuses admission. PA Idol d/w Dr. Janna Arch.  Patient agrees to see Dr. Juanetta Gosling on Monday.  Will treat CAP and double home lasix dose.  Risks of leaving AMA discussed with patient. Return precautions discussed.  Glynn Octave, MD 01/15/12 [phone]

## 2012-01-15 NOTE — ED Notes (Signed)
Patient was ambulated with pulse ox.  o2 sat maintained at 90% with pulse of 89.  If patient walked and talked, his o2 sat was 89%

## 2012-01-15 NOTE — ED Notes (Signed)
Pt c/o chest congestion, non productive cough and shortness of breath x 2 weeks. States that he had a cold and has been unable to get the phlegm up. Pt denies fever or chills.

## 2012-01-15 NOTE — ED Provider Notes (Signed)
History     CSN: [id number]  Arrival date & time 01/15/12  0840   First MD Initiated Contact with Patient 01/15/12 [phone]      Chief Complaint  Patient presents with  . Shortness of Breath    (Consider location/radiation/quality/duration/timing/severity/associated sxs/prior treatment) Patient is a 56 y.o. male presenting with shortness of breath. The history is provided by the patient.  Shortness of Breath  Episode onset: 2 weeks ago. The onset was gradual. The problem occurs continuously. The problem has been gradually worsening. The problem is moderate. The symptoms are relieved by rest (has uses albuterol inhaler without relief.). Associated symptoms include orthopnea, cough and shortness of breath. Pertinent negatives include no chest pain, no chest pressure, no fever, no sore throat and no wheezing. Associated symptoms comments: He has chronic orthopnea which is slightly worsened over the past week.. The cough has no precipitants. The cough is non-productive. Nothing relieves the cough. Nothing worsens the cough. Past medical history comments: patient has a history of prior pneumonia, COPD and CHF.[name] Urine output has been normal.    Past Medical History  Diagnosis Date  . COPD (chronic obstructive pulmonary disease)   . Coronary artery disease   . Diabetes mellitus   . Arthritis   . Cancer   . CHF (congestive heart failure)   . Hypertension     Past Surgical History  Procedure Date  . Colon surgery   . Cardiac surgery   . Ankle surgery     right    History reviewed. No pertinent family history.  History  Substance Use Topics  . Smoking status: Current Everyday Smoker -- 1.0 packs/day    Types: Cigarettes  . Smokeless tobacco: Not on file  . Alcohol Use: No      Review of Systems  Constitutional: Negative for fever.  HENT: Negative for congestion, sore throat and neck pain.   Eyes: Negative.   Respiratory: Positive for cough and shortness of breath. Negative  for chest tightness and wheezing.   Cardiovascular: Positive for orthopnea and leg swelling. Negative for chest pain and palpitations.  Gastrointestinal: Negative for nausea and abdominal pain.  Genitourinary: Negative.   Musculoskeletal: Negative for joint swelling and arthralgias.  Skin: Negative.  Negative for rash and wound.  Neurological: Negative for dizziness, weakness, light-headedness, numbness and headaches.  Hematological: Negative.   Psychiatric/Behavioral: Negative.     Allergies  Vancomycin  Home Medications   Current Outpatient Rx  Name Route Sig Dispense Refill  . ALBUTEROL SULFATE HFA 108 (90 BASE) MCG/ACT IN AERS Inhalation Inhale 2 puffs into the lungs every 6 (six) hours as needed. For shortness of breath    . ALBUTEROL SULFATE (2.5 MG/3ML) 0.083% IN NEBU Nebulization Take 2.5 mg by nebulization every 6 (six) hours as needed. For shortness of breath    . ASPIRIN 325 MG PO TABS Oral Take 325 mg by mouth daily.    . FUROSEMIDE 40 MG PO TABS Oral Take 40 mg by mouth daily.    [name] HYDROCODONE-ACETAMINOPHEN 5-500 MG PO TABS Oral Take 1 tablet by mouth every 6 (six) hours as needed. For pain    . INSULIN GLARGINE 100 UNIT/ML [place] SOLN Subcutaneous Inject 15 Units into the skin 2 (two) times daily.    [name] MAGNESIUM OXIDE 400 MG PO TABS Oral Take 400 mg by mouth 2 (two) times daily.    [name] METFORMIN HCL 500 MG PO TABS Oral Take 1,000 mg by mouth 2 (two) times daily with a meal.    .  NITROGLYCERIN 0.4 MG SL SUBL Sublingual Place 0.4 mg under the tongue every 5 (five) minutes x 3 doses as needed. For chest pain    . PENICILLIN V POTASSIUM 500 MG PO TABS Oral Take 500 mg by mouth 4 (four) times daily as needed. Takes when teeth hurt    . PROMETHAZINE HCL 25 MG PO TABS Oral Take 25 mg by mouth every 6 (six) hours as needed. For nausea    . SIMVASTATIN 80 MG PO TABS Oral Take 40 mg by mouth at bedtime.    . TICLOPIDINE HCL 250 MG PO TABS Oral Take 250 mg by mouth 2 (two) times daily.      . AZITHROMYCIN 250 MG PO TABS Oral Take 1 tablet (250 mg total) by mouth daily. Take first 2 tablets together, then 1 every day until finished. 4 tablet 0    BP 140/90  Pulse 86  Temp(Src) 97.3 F (36.3 C) (Oral)  Resp 24  Ht 6' (1.829 m)  Wt 175 lb (79.379 kg)  BMI 23.73 kg/m2  SpO2 99%  Physical Exam  Nursing note and vitals reviewed. Constitutional: He is oriented to person, place, and time. He appears well-developed and well-nourished.  HENT:  Head: Normocephalic and atraumatic.  Eyes: Conjunctivae are normal.  Neck: Normal range of motion.  Cardiovascular: Normal rate, regular rhythm, normal heart sounds and intact distal pulses.  Exam reveals no friction rub.   No murmur heard.      2+ bilateral lower extremity edema to upper tibia.  Pulmonary/Chest: No accessory muscle usage. Tachypnea noted. No respiratory distress. He has no wheezes. He has rhonchi in the right lower field. He has rales in the right lower field and the left lower field. He exhibits no tenderness.  Abdominal: Soft. Bowel sounds are normal. There is no tenderness.  Musculoskeletal: Normal range of motion.  Neurological: He is alert and oriented to person, place, and time.  Skin: Skin is warm and dry.  Psychiatric: He has a normal mood and affect.    ED Course  Procedures (including critical care time)  Labs Reviewed  CBC - Abnormal; Notable for the following:    Hemoglobin 12.0 (*)    HCT 37.7 (*)    All other components within normal limits  DIFFERENTIAL - Abnormal; Notable for the following:    Neutrophils Relative 82 (*)    Neutro Abs 8.2 (*)    Lymphocytes Relative 10 (*)    All other components within normal limits  BASIC METABOLIC PANEL - Abnormal; Notable for the following:    Glucose, Bld 176 (*)    All other components within normal limits  PRO B NATRIURETIC PEPTIDE - Abnormal; Notable for the following:    Pro B Natriuretic peptide (BNP) 3519.0 (*)    All other components within  normal limits  POCT I-STAT TROPONIN I   Dg Chest 2 View  01/15/2012  *RADIOLOGY REPORT*  Clinical Data: Shortness of breath.  Cough.  Congestion.  Tobacco use.  CHEST - 2 VIEW  Comparison: 03/29/2010  Findings: Right middle lobe and right lower lobe airspace opacity noted, suspicious for pneumonia.  Small bilateral pleural effusions are suggested on the lateral projection.  Heart size is within normal limits.  IMPRESSION:  1.  Airspace opacities in the right lower lobe and right middle lobe suggest pneumonia.  Follow-up imaging to clearance is recommended to exclude underlying lung cancer. 2.  Small bilateral pleural effusions.  Original Report Authenticated By: Dellia Cloud, M.D.  1. Community acquired pneumonia   2. CHF (congestive heart failure)     Patient was given albuterol 5 mg and Atrovent 0.5 mg neb with no significant relief in symptoms.  He was also given Rocephin 1 g Zithromax 500 mg IV given chest x-ray findings.  With elevated BNP patient was also given Lasix 80 mg IV and nitroglycerin ointment 1 inch apply to chest.  He was significantly diuresed while in ED.  Discussed admission for further treatment of his symptoms which patient has deferred today.  He was ambulated in the department with no significant hypoxia or increased tachypnea.  Patient was strongly encouraged to return at anytime is symptoms worsen.  Otherwise he will plan to see his PCP Monday morning for recheck.  MDM  Patient was prescribed Zithromax 4 tablets for completion of this antibiotic treatment.  He was encouraged to double his Lasix dose tomorrow to 80 mg by mouth.  He will see Dr. Juanetta Gosling Monday morning with further management per his recommendation.        Candis Musa, PA 01/15/12 1500    Date: 01/15/2012  Rate: 92  Rhythm: normal sinus rhythm  QRS Axis: left  Intervals: QT prolonged  ST/T Wave abnormalities: T wave inversino in V2-V4,  unchanged  Conduction Disutrbances:none   Narrative Interpretation:   Old EKG Reviewed: changes noted    Candis Musa, PA 01/15/12 1505

## 2012-01-15 NOTE — ED Notes (Signed)
Pt placed on [place] at 2L/min.  Sats maintained at 100%.  nad noted.

## 2012-01-15 NOTE — ED Notes (Signed)
Pt a/ox4. Resp even and unlabored. NAD at this time. IV abx started per orders. Pt denies pain and SOB at this time. Pt lying supine in stretcher watching TV. Stretcher in low locked position. Side rail up for pt safety. Call light within reach. Education on plan of care provided. Pt verbalized understanding. Awaiting further orders. Will continue to monitor.

## 2012-01-15 NOTE — Discharge Instructions (Signed)
Heart Failure Heart failure (HF) is a condition in which the heart has trouble pumping blood. This means your heart does not pump blood efficiently for your body to work well. In some cases of HF, fluid may back up into your lungs or you may have swelling (edema) in your lower legs. HF is a long-term (chronic) condition. It is important for you to take good care of yourself and follow your caregiver's treatment plan. CAUSES   Health conditions:   High blood pressure (hypertension) causes the heart muscle to work harder than normal. When pressure in the blood vessels is high, the heart needs to pump (contract) with more force in order to circulate blood throughout the body. High blood pressure eventually causes the heart to become stiff and weak.   Coronary artery disease (CAD) is the buildup of cholesterol and fat (plaques) in the arteries of the heart. The blockage in the arteries deprives the heart muscle of oxygen and blood. This can cause chest pain and may lead to a heart attack. High blood pressure can also contribute to CAD.   Heart attack (myocardial infarction) occurs when 1 or more arteries in the heart become blocked. The loss of oxygen damages the muscle tissue of the heart. When this happens, part of the heart muscle dies. The injured tissue does not contract as well and weakens the heart's ability to pump blood.   Abnormal heart valves can cause HF when the heart valves do not open and close properly. This makes the heart muscle pump harder to keep the blood flowing.   Heart muscle disease (cardiomyopathy or myocarditis) is damage to the heart muscle from a variety of causes. These can include drug or alcohol abuse, infections, or unknown reasons. These can increase the risk of HF.   Lung disease makes the heart work harder because the lungs do not work properly. This can cause a strain on the heart leading it to fail.   Diabetes increases the risk of HF. High blood sugar contributes  to high fat (lipid) levels in the blood. Diabetes can also cause slow damage to tiny blood vessels that carry important nutrients to the heart muscle. When the heart does not get enough oxygen and food, it can cause the heart to become weak and stiff. This leads to a heart that does not contract efficiently.   Other diseases can contribute to HF. These include abnormal heart rhythms, thyroid problems, and low blood counts (anemia).   Unhealthy lifestyle habits:   Obesity.   Smoking.   Eating foods high in fat and cholesterol.   Eating or drinking beverages high in salt.   Drug or alcohol abuse.   Lack of exercise.  SYMPTOMS  HF symptoms may vary and can be hard to detect. Symptoms may include:  Shortness of breath with activity, such as climbing stairs.   Persistent cough.   Swelling of the feet, ankles, legs, or abdomen.   Unexplained weight gain.   Difficulty breathing when lying flat.   Waking from sleep because of the need to sit up and get more air.   Rapid heartbeat.   Fatigue and loss of energy.   Feeling lightheaded or close to fainting.  DIAGNOSIS  A diagnosis of HF is based on your history, symptoms, physical examination, and diagnostic tests. Diagnostic tests for HF may include:  EKG.   Chest X-ray.   Blood tests.   Exercise stress test.   Blood oxygen test (arterial blood gas).   Evaluation   by a heart doctor (cardiologist).   Ultrasound evaluation of the heart (echocardiogram).   Heart artery test to look for blockages (angiogram).   Radioactive imaging to look at the heart (radionuclide test).  TREATMENT  Treatment is aimed at managing the symptoms of HF. Medicines, lifestyle changes, or surgical intervention may be necessary to treat HF.  Medicines to help treat HF may include:   Angiotensin-converting enzyme (ACE) inhibitors. These block the effects of a blood protein called angiotensin-converting enzyme. ACE inhibitors relax (dilate) the  blood vessels and help lower blood pressure. This decreases the workload of the heart, slows the progression of HF, and improves symptoms.   Angiotensin receptor blockers (ARBs). These medications work similar to ACE inhibitors. ARBs may be an alternative for people who cannot tolerate an ACE inhibitor.   Aldosterone antagonists. This medication helps get rid of extra fluid from your body. This lowers the volume of blood the heart has to pump.   Water pills (diuretics). Diuretics cause the kidneys to remove salt and water from the blood. The extra fluid is removed by urination. By removing extra fluid from the body, diuretics help lower the workload of the heart and help prevent fluid buildup in the lungs so breathing is easier.   Beta blockers. These prevent the heart from beating too fast and improve heart muscle strength. Beta blockers help maintain a normal heart rate, control blood pressure, and improve HF symptoms.   Digitalis. This increases the force of the heartbeat and may be helpful to people with HF or heart rhythm problems.   Healthy lifestyle changes include:   Stopping smoking.   Eating a healthy diet. Avoid foods high in fat. Avoid foods fried in oil or made with fat. A dietician can help with healthy food choices.   Limiting how much salt you eat.   Limiting alcohol intake to no more than 1 drink per day for women and 2 drinks per day for men. Drinking more than that is harmful to your heart. If your heart has already been damaged by alcohol or you have severe HF, drinking alcohol should be stopped completely.   Exercising as directed by your caregiver.   Surgical treatment for HF may include:   Procedures to open blocked arteries, repair damaged heart valves, or remove damaged heart muscle tissue.   A pacemaker to help heart muscle function and to control certain abnormal heart rhythms.   A defibrillator to possibly prevent sudden cardiac death.  HOME CARE  INSTRUCTIONS   Activity level. Your caregiver can help you determine what type of exercise program may be helpful. It is important to maintain your strength. Pace your physical activity to avoid shortness of breath or chest pain. Rest for 1 hour before and after meals. A cardiac rehabilitation program may be helpful to some people with HF.   Diet. Eat a heart healthy diet. Food choices should be low in saturated fat and cholesterol. Talk to a dietician to learn about heart healthy foods.   Salt intake. When you have HF, you need to limit the amount of salt you eat. Eat less than 1500 milligrams (mg) of salt per day or as recommended by your caregiver.   Weight monitoring. Weigh yourself every day. You should weigh yourself in the morning after you urinate and before you eat breakfast. Wear the same amount of clothing each time you weigh yourself. Record your weight daily. Bring your recorded weights to your clinic visits. Tell your caregiver right away if   you have gained 3 lb/1.4 kg in 1 day, or 5 lb/2.3 kg in a week or whatever amount you were told to report.   Blood pressure monitoring. This should be done as directed by your caregiver. A home blood pressure cuff can be purchased at a drugstore. Record your blood pressure numbers and bring them to your clinic visits. Tell your caregiver if you become dizzy or lightheaded upon standing up.   Smoking. If you are currently a smoker, it is time to quit. Nicotine makes your heart work harder by causing your blood vessels to constrict. Do not use nicotine gum or patches before talking to your caregiver.   Follow up. Be sure to schedule a follow-up visit with your caregiver. Keep all your appointments.  SEEK MEDICAL CARE IF:   Your weight increases by 3 lb/1.4 kg in 1 day or 5 lb/2.3 kg in a week.   You notice increasing shortness of breath that is unusual for you. This may happen during rest, sleep, or with activity.   You cough more than normal,  especially with physical activity.   You notice more swelling in your hands, feet, ankles, or belly (abdomen).   You are unable to sleep because it is hard to breathe.   You cough up bloody mucus (sputum).   You begin to feel "jumping" or "fluttering" sensations (palpitations) in your chest.  SEEK IMMEDIATE MEDICAL CARE IF:   You have severe chest pain or pressure which may include symptoms such as:   Pain or pressure in the arms, neck, jaw, or back.   Feeling sweaty.   Feeling sick to your stomach (nauseous).   Feeling short of breath while at rest.   Having a fast or irregular heartbeat.   You experience stroke symptoms. These symptoms include:   Facial weakness or numbness.   Weakness or numbness in an arm, leg, or on one side of your body.   Blurred vision.   Difficulty talking or thinking.   Dizziness or fainting.   Severe headache.  THESE ARE MEDICAL EMERGENCIES. Do not wait to see if the symptoms go away. Call your local emergency services (911 in [place] DO NOT drive yourself to the hospital. IMPORTANT  Make a list of every medicine, vitamin, or herbal supplement you are taking. Keep the list with you at all times. Show it to your caregiver at every visit. Keep the list up-to-date.   Ask your caregiver or pharmacist to write an explanation of each medicine you are taking. This should include:   Why you are taking it.   The possible side effects.   The best time of day to take it.   Foods to take with it or what foods to avoid.   When to stop taking it.  MAKE SURE YOU:   Understand these instructions.   Will watch your condition.   Will get help right away if you are not doing well or get worse.  Document Released: 09/20/2005 Document Revised: 09/09/2011 Document Reviewed: 01/02/2010 [facility] Patient Information 2012 [place], [place].Pneumonia, Adult Pneumonia is an infection of the lungs.  CAUSES Pneumonia may be caused by bacteria or a virus.  Usually, these infections are caused by breathing infectious particles into the lungs (respiratory tract). SYMPTOMS   Cough.   Fever.   Chest pain.   Increased rate of breathing.   Wheezing.   Mucus production.  DIAGNOSIS  If you have the common symptoms of pneumonia, your caregiver will typically confirm the diagnosis with  a chest X-ray. The X-ray will show an abnormality in the lung (pulmonary infiltrate) if you have pneumonia. Other tests of your blood, urine, or sputum may be done to find the specific cause of your pneumonia. Your caregiver may also do tests (blood gases or pulse oximetry) to see how well your lungs are working. TREATMENT  Some forms of pneumonia may be spread to other people when you cough or sneeze. You may be asked to wear a mask before and during your exam. Pneumonia that is caused by bacteria is treated with antibiotic medicine. Pneumonia that is caused by the influenza virus may be treated with an antiviral medicine. Most other viral infections must run their course. These infections will not respond to antibiotics.  PREVENTION A pneumococcal shot (vaccine) is available to prevent a common bacterial cause of pneumonia. This is usually suggested for:  People over 30 years old.   Patients on chemotherapy.   People with chronic lung problems, such as bronchitis or emphysema.   People with immune system problems.  If you are over 65 or have a high risk condition, you may receive the pneumococcal vaccine if you have not received it before. In some countries, a routine influenza vaccine is also recommended. This vaccine can help prevent some cases of pneumonia.You may be offered the influenza vaccine as part of your care. If you smoke, it is time to quit. You may receive instructions on how to stop smoking. Your caregiver can provide medicines and counseling to help you quit. HOME CARE INSTRUCTIONS   Cough suppressants may be used if you are losing too much rest.  However, coughing protects you by clearing your lungs. You should avoid using cough suppressants if you can.   Your caregiver may have prescribed medicine if he or she thinks your pneumonia is caused by a bacteria or influenza. Finish your medicine even if you start to feel better.   Your caregiver may also prescribe an expectorant. This loosens the mucus to be coughed up.   Only take over-the-counter or prescription medicines for pain, discomfort, or fever as directed by your caregiver.   Do not smoke. Smoking is a common cause of bronchitis and can contribute to pneumonia. If you are a smoker and continue to smoke, your cough may last several weeks after your pneumonia has cleared.   A cold steam vaporizer or humidifier in your room or home may help loosen mucus.   Coughing is often worse at night. Sleeping in a semi-upright position in a recliner or using a couple pillows under your head will help with this.   Get rest as you feel it is needed. Your body will usually let you know when you need to rest.  SEEK IMMEDIATE MEDICAL CARE IF:   Your illness becomes worse. This is especially true if you are elderly or weakened from any other disease.   You cannot control your cough with suppressants and are losing sleep.   You begin coughing up blood.   You develop pain which is getting worse or is uncontrolled with medicines.   You have a fever.   Any of the symptoms which initially brought you in for treatment are getting worse rather than better.   You develop shortness of breath or chest pain.  MAKE SURE YOU:   Understand these instructions.   Will watch your condition.   Will get help right away if you are not doing well or get worse.  Document Released: 09/20/2005 Document Revised: 09/09/2011  Document Reviewed: 12/10/2010 [facility] Patient Information 2012 [place], [place].   Take your next dose of Zithromax tomorrow morning.  Take your next dose of Lasix tomorrow morning as  well, but take a double dose for a total of 80 mg tomorrow.  Plan to see Dr. Juanetta Gosling on Monday morning for recheck of your symptoms.  In the interim, return here for further management if your symptoms worsen.  If your shortness of breath continues or worsens we will strongly recommend admission for further management of your symptoms.

## 2012-01-15 NOTE — ED Notes (Signed)
Pt a/ox4. Resp even and unlabored. NAD at this time. D/C instructions reviewed with pt. Pt verbalized understanding. Pt ambulated to lobby with steady gate. Wife with pt to transport home. 

## 2012-01-20 ENCOUNTER — Encounter (HOSPITAL_COMMUNITY): Payer: Self-pay | Admitting: *Deleted

## 2012-01-20 ENCOUNTER — Emergency Department (HOSPITAL_COMMUNITY): Payer: Medicare Other

## 2012-01-20 ENCOUNTER — Inpatient Hospital Stay (HOSPITAL_COMMUNITY)
Admission: AD | Admit: 2012-01-20 | Discharge: 2012-01-21 | DRG: 195 | Disposition: A | Discharge status: Home / Self Care | Payer: Medicare Other | Attending: Pulmonary Disease | Admitting: Pulmonary Disease

## 2012-01-20 DIAGNOSIS — J449 Chronic obstructive pulmonary disease, unspecified: Secondary | ICD-10-CM | POA: Diagnosis present

## 2012-01-20 DIAGNOSIS — J4489 Other specified chronic obstructive pulmonary disease: Secondary | ICD-10-CM | POA: Diagnosis present

## 2012-01-20 DIAGNOSIS — E785 Hyperlipidemia, unspecified: Secondary | ICD-10-CM | POA: Diagnosis present

## 2012-01-20 DIAGNOSIS — J189 Pneumonia, unspecified organism: Principal | ICD-10-CM | POA: Diagnosis present

## 2012-01-20 DIAGNOSIS — I10 Essential (primary) hypertension: Secondary | ICD-10-CM | POA: Diagnosis present

## 2012-01-20 DIAGNOSIS — Z85038 Personal history of other malignant neoplasm of large intestine: Secondary | ICD-10-CM

## 2012-01-20 DIAGNOSIS — I509 Heart failure, unspecified: Secondary | ICD-10-CM

## 2012-01-20 DIAGNOSIS — M129 Arthropathy, unspecified: Secondary | ICD-10-CM | POA: Diagnosis present

## 2012-01-20 DIAGNOSIS — Z794 Long term (current) use of insulin: Secondary | ICD-10-CM

## 2012-01-20 DIAGNOSIS — I2510 Atherosclerotic heart disease of native coronary artery without angina pectoris: Secondary | ICD-10-CM | POA: Diagnosis present

## 2012-01-20 DIAGNOSIS — E119 Type 2 diabetes mellitus without complications: Secondary | ICD-10-CM | POA: Diagnosis present

## 2012-01-20 DIAGNOSIS — Z79899 Other long term (current) drug therapy: Secondary | ICD-10-CM

## 2012-01-20 LAB — DIFFERENTIAL
Eosinophils Absolute: 0.1 10*3/uL (ref 0.0–0.7)
Lymphocytes Relative: 15 % (ref 12–46)
Lymphs Abs: 1.1 10*3/uL (ref 0.7–4.0)
Monocytes Relative: 3 % (ref 3–12)
Neutro Abs: 5.8 10*3/uL (ref 1.7–7.7)
Neutrophils Relative %: 80 % — ABNORMAL HIGH (ref 43–77)

## 2012-01-20 LAB — CBC
Hemoglobin: 10.7 g/dL — ABNORMAL LOW (ref 13.0–17.0)
MCH: 27.6 pg (ref 26.0–34.0)
Platelets: 269 10*3/uL (ref 150–400)
RBC: 3.87 MIL/uL — ABNORMAL LOW (ref 4.22–5.81)
WBC: 7.3 10*3/uL (ref 4.0–10.5)

## 2012-01-20 LAB — BASIC METABOLIC PANEL
Calcium: 8.9 mg/dL (ref 8.4–10.5)
GFR calc Af Amer: >90 mL/min (ref >90)
GFR calc non Af Amer: >90 mL/min (ref >90)
Glucose, Bld: 238 mg/dL — ABNORMAL HIGH (ref 70–99)
Potassium: 4 mEq/L (ref 3.5–5.1)
Sodium: 134 mEq/L — ABNORMAL LOW (ref 135–145)

## 2012-01-20 LAB — GLUCOSE, CAPILLARY
Glucose-Capillary: 199 mg/dL — ABNORMAL HIGH (ref 70–99)
Glucose-Capillary: 144 mg/dL — ABNORMAL HIGH (ref 70–99)
Glucose-Capillary: 213 mg/dL — ABNORMAL HIGH (ref 70–99)

## 2012-01-20 LAB — LACTIC ACID, PLASMA: Lactic Acid, Venous: 0.8 mmol/L (ref 0.5–2.2)

## 2012-01-20 LAB — CARDIAC PANEL(CRET KIN+CKTOT+MB+TROPI)
CK, MB: 3.9 ng/mL (ref 0.3–4.0)
CK, MB: 3.7 ng/mL (ref 0.3–4.0)
Total CK: 76 U/L (ref 7–232)
Total CK: 66 U/L (ref 7–232)
Troponin I: <0.3 ng/mL (ref <0.30)
Troponin I: <0.3 ng/mL (ref <0.30)

## 2012-01-20 LAB — PROCALCITONIN: Procalcitonin: <0.1 ng/mL

## 2012-01-20 MED ORDER — TRAZODONE HCL 50 MG PO TABS
50.0000 mg | ORAL_TABLET | Freq: Every evening | ORAL | Status: DC | PRN
  Filled 2012-01-20: qty 1

## 2012-01-20 MED ORDER — ALUM & MAG HYDROXIDE-SIMETH 200-200-20 MG/5ML PO SUSP: 30.0000 mL | Freq: Four times a day (QID) | ORAL | Status: DC | PRN

## 2012-01-20 MED ORDER — ATORVASTATIN CALCIUM 40 MG PO TABS
40.0000 mg | ORAL_TABLET | Freq: Every day | ORAL | Status: DC
  Administered 2012-01-20: 40 mg via ORAL
  Filled 2012-01-20 (×3): qty 1

## 2012-01-20 MED ORDER — HYDROCODONE-ACETAMINOPHEN 5-325 MG PO TABS: 1.0000 | ORAL_TABLET | ORAL | Status: DC | PRN

## 2012-01-20 MED ORDER — INSULIN ASPART 100 UNIT/ML ~~LOC~~ SOLN
0.0000 [IU] | Freq: Three times a day (TID) | SUBCUTANEOUS | Status: DC
  Administered 2012-01-20: 3 [IU] via SUBCUTANEOUS
  Administered 2012-01-20 – 2012-01-21 (×2): 2 [IU] via SUBCUTANEOUS
  Filled 2012-01-20: qty 1

## 2012-01-20 MED ORDER — SODIUM CHLORIDE 0.9 % IJ SOLN: 3.0000 mL | INTRAMUSCULAR | Status: DC | PRN

## 2012-01-20 MED ORDER — ASPIRIN 325 MG PO TABS
325.0000 mg | ORAL_TABLET | Freq: Every day | ORAL | Status: DC
  Administered 2012-01-20 – 2012-01-21 (×2): 325 mg via ORAL
  Filled 2012-01-20 (×2): qty 1

## 2012-01-20 MED ORDER — BIOTENE DRY MOUTH MT LIQD
15.0000 mL | Freq: Two times a day (BID) | OROMUCOSAL | Status: DC
  Administered 2012-01-20 – 2012-01-21 (×2): 15 mL via OROMUCOSAL

## 2012-01-20 MED ORDER — MAGNESIUM OXIDE 400 MG PO TABS
400.0000 mg | ORAL_TABLET | Freq: Two times a day (BID) | ORAL | Status: DC
  Administered 2012-01-20 – 2012-01-21 (×3): 400 mg via ORAL
  Filled 2012-01-20 (×7): qty 1

## 2012-01-20 MED ORDER — MOXIFLOXACIN HCL IN NACL 400 MG/250ML IV SOLN
400.0000 mg | Freq: Once | INTRAVENOUS | Status: AC
  Administered 2012-01-20: 400 mg via INTRAVENOUS
  Filled 2012-01-20: qty 250

## 2012-01-20 MED ORDER — INSULIN ASPART 100 UNIT/ML ~~LOC~~ SOLN
0.0000 [IU] | Freq: Every day | SUBCUTANEOUS | Status: DC
  Administered 2012-01-20: 2 [IU] via SUBCUTANEOUS

## 2012-01-20 MED ORDER — FUROSEMIDE 10 MG/ML IJ SOLN
40.0000 mg | INTRAMUSCULAR | Status: AC
  Administered 2012-01-20: 40 mg via INTRAVENOUS
  Filled 2012-01-20: qty 4

## 2012-01-20 MED ORDER — ALBUTEROL SULFATE (5 MG/ML) 0.5% IN NEBU
2.5000 mg | INHALATION_SOLUTION | RESPIRATORY_TRACT | Status: DC
  Administered 2012-01-20 – 2012-01-21 (×3): 2.5 mg via RESPIRATORY_TRACT
  Filled 2012-01-20 (×3): qty 0.5

## 2012-01-20 MED ORDER — FUROSEMIDE 10 MG/ML IJ SOLN
40.0000 mg | Freq: Two times a day (BID) | INTRAMUSCULAR | Status: DC
  Administered 2012-01-20 (×2): 40 mg via INTRAVENOUS
  Filled 2012-01-20 (×2): qty 4

## 2012-01-20 MED ORDER — NITROGLYCERIN 0.4 MG SL SUBL: 0.4000 mg | SUBLINGUAL_TABLET | SUBLINGUAL | Status: DC | PRN

## 2012-01-20 MED ORDER — SODIUM CHLORIDE 0.9 % IJ SOLN
3.0000 mL | Freq: Two times a day (BID) | INTRAMUSCULAR | Status: DC
  Administered 2012-01-20 (×2): 3 mL via INTRAVENOUS
  Filled 2012-01-20: qty 3

## 2012-01-20 MED ORDER — TICLOPIDINE HCL 250 MG PO TABS
250.0000 mg | ORAL_TABLET | Freq: Two times a day (BID) | ORAL | Status: DC
  Administered 2012-01-20 – 2012-01-21 (×3): 250 mg via ORAL
  Filled 2012-01-20 (×9): qty 1

## 2012-01-20 MED ORDER — ACETAMINOPHEN 325 MG PO TABS: 650.0000 mg | ORAL_TABLET | Freq: Four times a day (QID) | ORAL | Status: DC | PRN

## 2012-01-20 MED ORDER — NITROGLYCERIN 0.4 MG SL SUBL
SUBLINGUAL_TABLET | SUBLINGUAL | Status: AC
  Administered 2012-01-20: 0.4 mg via SUBLINGUAL
  Filled 2012-01-20: qty 25

## 2012-01-20 MED ORDER — NITROGLYCERIN 0.4 MG SL SUBL
0.4000 mg | SUBLINGUAL_TABLET | Freq: Once | SUBLINGUAL | Status: AC
  Administered 2012-01-20: 0.4 mg via SUBLINGUAL

## 2012-01-20 MED ORDER — PROMETHAZINE HCL 12.5 MG PO TABS: 25.0000 mg | ORAL_TABLET | Freq: Four times a day (QID) | ORAL | Status: DC | PRN

## 2012-01-20 MED ORDER — ACETAMINOPHEN 650 MG RE SUPP: 650.0000 mg | Freq: Four times a day (QID) | RECTAL | Status: DC | PRN

## 2012-01-20 MED ORDER — SODIUM CHLORIDE 0.9 % IV SOLN: 250.0000 mL | INTRAVENOUS | Status: DC | PRN

## 2012-01-20 MED ORDER — MOXIFLOXACIN HCL IN NACL 400 MG/250ML IV SOLN
400.0000 mg | INTRAVENOUS | Status: DC
  Filled 2012-01-20 (×4): qty 250

## 2012-01-20 MED ORDER — ENOXAPARIN SODIUM 40 MG/0.4ML ~~LOC~~ SOLN
40.0000 mg | SUBCUTANEOUS | Status: DC
  Administered 2012-01-20: 40 mg via SUBCUTANEOUS
  Filled 2012-01-20: qty 0.4

## 2012-01-20 MED ORDER — DOCUSATE SODIUM 100 MG PO CAPS
100.0000 mg | ORAL_CAPSULE | Freq: Two times a day (BID) | ORAL | Status: DC
  Administered 2012-01-20 – 2012-01-21 (×2): 100 mg via ORAL
  Filled 2012-01-20 (×2): qty 1

## 2012-01-20 MED ORDER — ALBUTEROL SULFATE (5 MG/ML) 0.5% IN NEBU
5.0000 mg | INHALATION_SOLUTION | Freq: Once | RESPIRATORY_TRACT | Status: AC
  Administered 2012-01-20: 5 mg via RESPIRATORY_TRACT
  Filled 2012-01-20: qty 1

## 2012-01-20 MED ORDER — INSULIN GLARGINE 100 UNIT/ML ~~LOC~~ SOLN
15.0000 [IU] | Freq: Two times a day (BID) | SUBCUTANEOUS | Status: DC
  Administered 2012-01-20 – 2012-01-21 (×3): 15 [IU] via SUBCUTANEOUS
  Filled 2012-01-20: qty 1

## 2012-01-20 MED ORDER — SODIUM CHLORIDE 0.9 % IJ SOLN
3.0000 mL | Freq: Two times a day (BID) | INTRAMUSCULAR | Status: DC
  Administered 2012-01-21: 3 mL via INTRAVENOUS
  Filled 2012-01-20: qty 3

## 2012-01-20 NOTE — ED Notes (Signed)
Respiratory paged for a breathing treatment at this time.  

## 2012-01-20 NOTE — ED Notes (Signed)
Lab paged & called lab to advise pt will have to be drawn. IV in place when pt arrived.

## 2012-01-20 NOTE — H&P (Signed)
[name] MRN: [id number] DOB/AGE: 56-13-1957 56 y.o. Primary Care Physician:Viann Nielson L, MD, MD Admit date: 01/20/2012 Chief Complaint: Chest pain/shortness of breath HPI: This is a 56 year old who was diagnosed with pneumonia about a week ago. He had been doing fairly well but then developed chest pain this morning and came to the emergency room because of his chest pain. He has a significant history of coronary artery occlusive disease and congestive heart failure. He also has COPD. He says he doesn't feel that badly but he does have chest discomfort on the left side. Initial workup for acute coronary syndrome was negative.  Past Medical History  Diagnosis Date  . COPD (chronic obstructive pulmonary disease)   . Coronary artery disease   . Diabetes mellitus   . Arthritis   . Cancer   . CHF (congestive heart failure)   . Hypertension    Past Surgical History  Procedure Date  . Colon surgery   . Cardiac surgery   . Ankle surgery     right        History reviewed. No pertinent family history. Positive for COPD and heart disease Social History:  reports that he has been smoking Cigarettes.  He has been smoking about 1 pack per day. He does not have any smokeless tobacco history on file. He reports that he does not drink alcohol or use illicit drugs.   Allergies:  Allergies  Allergen Reactions  . Vancomycin Itching and Rash    Medications Prior to Admission  Medication Dose Route Frequency Provider Last Rate Last Dose  . albuterol (PROVENTIL) (5 MG/ML) 0.5% nebulizer solution 5 mg  5 mg Nebulization Once Vida Roller, MD   5 mg at 01/20/12 0540  . furosemide (LASIX) injection 40 mg  40 mg Intravenous STAT Vida Roller, MD   40 mg at 01/20/12 2130  . moxifloxacin (AVELOX) IVPB 400 mg  400 mg Intravenous Once Vida Roller, MD 250 mL/hr at 01/20/12 0633 400 mg at 01/20/12 8657  . nitroGLYCERIN (NITROSTAT) SL tablet 0.4 mg  0.4 mg Sublingual Once Vida Roller,  MD   0.4 mg at 01/20/12 8469   Medications Prior to Admission  Medication Sig Dispense Refill  . albuterol (PROVENTIL HFA;VENTOLIN HFA) 108 (90 BASE) MCG/ACT inhaler Inhale 2 puffs into the lungs every 6 (six) hours as needed. For shortness of breath      . albuterol (PROVENTIL) (2.5 MG/3ML) 0.083% nebulizer solution Take 2.5 mg by nebulization every 6 (six) hours as needed. For shortness of breath      . aspirin 325 MG tablet Take 325 mg by mouth daily.      . furosemide (LASIX) 40 MG tablet Take 40 mg by mouth daily.      [name] HYDROcodone-acetaminophen (VICODIN) 5-500 MG per tablet Take 1 tablet by mouth every 6 (six) hours as needed. For pain      . insulin glargine (LANTUS) 100 UNIT/ML injection Inject 15 Units into the skin 2 (two) times daily.      . magnesium oxide (MAG-OX) 400 MG tablet Take 400 mg by mouth 2 (two) times daily.      . metFORMIN (GLUCOPHAGE) 500 MG tablet Take 1,000 mg by mouth 2 (two) times daily with a meal.      . nitroGLYCERIN (NITROSTAT) 0.4 MG SL tablet Place 0.4 mg under the tongue every 5 (five) minutes x 3 doses as needed. For chest pain      . penicillin v potassium (VEETID)  500 MG tablet Take 500 mg by mouth 4 (four) times daily as needed. Takes when teeth hurt      . promethazine (PHENERGAN) 25 MG tablet Take 25 mg by mouth every 6 (six) hours as needed. For nausea      . simvastatin (ZOCOR) 80 MG tablet Take 40 mg by mouth at bedtime.      . ticlopidine (TICLID) 250 MG tablet Take 250 mg by mouth 2 (two) times daily.      [name] azithromycin (ZITHROMAX) 250 MG tablet Take 1 tablet (250 mg total) by mouth daily. Take first 2 tablets together, then 1 every day until finished.  4 tablet  0       ZOX:WRUEA from the symptoms mentioned above,there are no other symptoms referable to all systems reviewed.  Physical Exam: Blood pressure 132/75, pulse 100, temperature 98.2 F (36.8 C), resp. rate 20, height 6' (1.829 m), weight 79.379 kg (175 lb), SpO2 92.00%. He is  awake and alert and looks relatively comfortable. He feels warm. His pupils are reactive. He has multiple carious teeth. His neck is supple without masses. His chest is relatively clear with some rhonchi bilaterally. Heart is regular without murmur and no gallop. His abdomen is soft without masses. Bowel sounds present and active. Extremities showed 2+ edema in the right and 1+ edema on the left. Central nervous system examination is grossly intact    Basename 01/20/12 0430  WBC 7.3  NEUTROABS 5.8  HGB 10.7*  HCT 33.3*  MCV 86.0  PLT 269    Basename 01/20/12 0430  NA 134*  K 4.0  CL 99  CO2 27  GLUCOSE 238*  BUN 14  CREATININE 0.83  CALCIUM 8.9  MG --  lablast2(ast:2,ALT:2,alkphos:2,bilitot:2,prot:2,albumin:2)@    Recent Results (from the past 240 hour(s))  CULTURE, BLOOD (ROUTINE X 2)     Status: Normal (Preliminary result)   Collection Time   01/20/12  4:30 AM      Component Value Range Status Comment   Specimen Description BLOOD LEFT ARM   Final    Special Requests     Final    Value: BOTTLES DRAWN AEROBIC AND ANAEROBIC AEB 6CC ANA 5CC   Culture PENDING   Incomplete    Report Status PENDING   Incomplete      Dg Chest 2 View  01/20/2012  *RADIOLOGY REPORT*  Clinical Data: Shortness of breath; chest tightness.  Recently diagnosed with pneumonia.  CHEST - 2 VIEW  Comparison: Chest radiograph performed 01/15/2012  Findings: The lungs are well-aerated.  There is persistent right basilar airspace opacification, perhaps mildly improved, and slightly more prominent left-sided airspace opacity, compatible with redistribution of the patient's pneumonia.  Small bilateral pleural effusions are again seen, right greater than left.  There is no evidence of pneumothorax.  The heart is borderline normal in size; the mediastinal contour is within normal limits.  No acute osseous abnormalities are seen.  IMPRESSION:  1.  Interval redistribution of the patient's bilateral pneumonia, perhaps  mildly improved at the right lung base, and mildly more prominent at the left lung.  As before, would perform follow-up chest radiograph after completion of treatment for pneumonia, to ensure resolution of airspace opacities. 2.  Small bilateral pleural effusions again noted, right greater than left.  Original Report Authenticated By: Tonia Ghent, M.D.   Dg Chest 2 View  01/15/2012  *RADIOLOGY REPORT*  Clinical Data: Shortness of breath.  Cough.  Congestion.  Tobacco use.  CHEST - 2 VIEW  Comparison: 03/29/2010  Findings: Right middle lobe and right lower lobe airspace opacity noted, suspicious for pneumonia.  Small bilateral pleural effusions are suggested on the lateral projection.  Heart size is within normal limits.  IMPRESSION:  1.  Airspace opacities in the right lower lobe and right middle lobe suggest pneumonia.  Follow-up imaging to clearance is recommended to exclude underlying lung cancer. 2.  Small bilateral pleural effusions.  Original Report Authenticated By: Dellia Cloud, M.D.   Impression: He has pneumonia. He has some CHF I think is well. He had chest pain will be ruled out for myocardial infarction Active Problems:  * No active hospital problems. *      Plan: He'll be admitted for IV Lasix serial enzymes and EKGs Avelox and see how he does      Diamonique Ruedas L Pager [phone]  01/20/2012, 7:21 AM

## 2012-01-20 NOTE — ED Notes (Addendum)
Per pt, reports chest tightness woke him about midnight.  Recently diagnosed with pneumonia on Saturday.  Pt denies nausea, reports chronic SOB due to COPD.  Pt did receive 4 81 mg ASA and 2 SL nitro sprays via EMS.  Pt denies relief.  Rating pain at 2/10. Per EMS, blood sugar was 261

## 2012-01-20 NOTE — ED Notes (Signed)
Patient ambulatory to restroom  ?

## 2012-01-20 NOTE — ED Provider Notes (Signed)
History     CSN: [id number]  Arrival date & time 01/20/12  0406   First MD Initiated Contact with Patient 01/20/12 [id number]      Chief Complaint  Patient presents with  . Chest Pain    (Consider location/radiation/quality/duration/timing/severity/associated sxs/prior treatment) HPI Comments: Coughing X the last 3 weeks, seen in the last few days for coughing, had RML and RLL pna on xray - left AMA and f/u with [name] - changed abx to Cefuroxime.  Feels slightly improved but has intermittent coughing spells.  No increased SOB.  The pain that he feels is on the left of sternum, is a soreness, is constant since midnight.    Does not use O2 at home regulalry - was 82% on arrival.  Has been using inhalers at home with the COPD but did not help with the CP this evening.   Patient is a 56 y.o. male presenting with chest pain. The history is provided by the patient and medical records.  Chest Pain     Past Medical History  Diagnosis Date  . COPD (chronic obstructive pulmonary disease)   . Coronary artery disease   . Diabetes mellitus   . Arthritis   . Cancer   . CHF (congestive heart failure)   . Hypertension     Past Surgical History  Procedure Date  . Colon surgery   . Cardiac surgery   . Ankle surgery     right    History reviewed. No pertinent family history.  History  Substance Use Topics  . Smoking status: Current Everyday Smoker -- 1.0 packs/day    Types: Cigarettes  . Smokeless tobacco: Not on file  . Alcohol Use: No      Review of Systems  Cardiovascular: Positive for chest pain.  All other systems reviewed and are negative.    Allergies  Vancomycin  Home Medications   Current Outpatient Rx  Name Route Sig Dispense Refill  . ALBUTEROL SULFATE HFA 108 (90 BASE) MCG/ACT IN AERS Inhalation Inhale 2 puffs into the lungs every 6 (six) hours as needed. For shortness of breath    . ALBUTEROL SULFATE (2.5 MG/3ML) 0.083% IN NEBU Nebulization Take 2.5 mg by  nebulization every 6 (six) hours as needed. For shortness of breath    . ASPIRIN 325 MG PO TABS Oral Take 325 mg by mouth daily.    [name] CEFUROXIME AXETIL 500 MG PO TABS Oral Take 500 mg by mouth 2 (two) times daily.    . FUROSEMIDE 40 MG PO TABS Oral Take 40 mg by mouth daily.    [name] HYDROCODONE-ACETAMINOPHEN 5-500 MG PO TABS Oral Take 1 tablet by mouth every 6 (six) hours as needed. For pain    . INSULIN GLARGINE 100 UNIT/ML [place] SOLN Subcutaneous Inject 15 Units into the skin 2 (two) times daily.    [name] MAGNESIUM OXIDE 400 MG PO TABS Oral Take 400 mg by mouth 2 (two) times daily.    [name] METFORMIN HCL 500 MG PO TABS Oral Take 1,000 mg by mouth 2 (two) times daily with a meal.    . NITROGLYCERIN 0.4 MG SL SUBL Sublingual Place 0.4 mg under the tongue every 5 (five) minutes x 3 doses as needed. For chest pain    . PENICILLIN V POTASSIUM 500 MG PO TABS Oral Take 500 mg by mouth 4 (four) times daily as needed. Takes when teeth hurt    . PROMETHAZINE HCL 25 MG PO TABS Oral Take 25 mg by mouth every  6 (six) hours as needed. For nausea    . SIMVASTATIN 80 MG PO TABS Oral Take 40 mg by mouth at bedtime.    . TICLOPIDINE HCL 250 MG PO TABS Oral Take 250 mg by mouth 2 (two) times daily.    . AZITHROMYCIN 250 MG PO TABS Oral Take 1 tablet (250 mg total) by mouth daily. Take first 2 tablets together, then 1 every day until finished. 4 tablet 0    BP 132/75  Pulse 100  Temp 98.2 F (36.8 C)  Resp 20  Ht 6' (1.829 m)  Wt 175 lb (79.379 kg)  BMI 23.73 kg/m2  SpO2 92%  Physical Exam  Nursing note and vitals reviewed. Constitutional: He appears well-developed and well-nourished.  HENT:  Head: Normocephalic and atraumatic.  Mouth/Throat: Oropharynx is clear and moist. No oropharyngeal exudate.  Eyes: Conjunctivae and EOM are normal. Pupils are equal, round, and reactive to light. Right eye exhibits no discharge. Left eye exhibits no discharge. No scleral icterus.  Neck: Normal range of motion. Neck supple. No  JVD present. No thyromegaly present.  Cardiovascular: Regular rhythm, normal heart sounds and intact distal pulses.  Exam reveals no gallop and no friction rub.   No murmur heard.      Tachycardia to 110  Pulmonary/Chest: No respiratory distress. He has wheezes. He has no rales.       Slight inc WOB, diffuse mild exp wheezign, rales on the R  Abdominal: Soft. Bowel sounds are normal. He exhibits no distension and no mass. There is no tenderness.  Musculoskeletal: Normal range of motion. He exhibits edema ( diffuse bil 2+ pitting edema of LE's). He exhibits no tenderness.  Lymphadenopathy:    He has no cervical adenopathy.  Neurological: He is alert. Coordination normal.  Skin: Skin is warm and dry. No rash noted. No erythema.  Psychiatric: He has a normal mood and affect. His behavior is normal.    ED Course  Procedures (including critical care time)  ED ECG REPORT   Date: 01/20/2012   Rate: 107  Rhythm: sinus tachycardia  QRS Axis: left  Intervals: normal  ST/T Wave abnormalities: nonspecific ST/T changes  Conduction Disutrbances:right bundle branch block  Narrative Interpretation:   Old EKG Reviewed: c/w 03/02/11, no sig changes   Labs Reviewed  CBC - Abnormal; Notable for the following:    RBC 3.87 (*)    Hemoglobin 10.7 (*)    HCT 33.3 (*)    All other components within normal limits  DIFFERENTIAL - Abnormal; Notable for the following:    Neutrophils Relative 80 (*)    All other components within normal limits  BASIC METABOLIC PANEL - Abnormal; Notable for the following:    Sodium 134 (*)    Glucose, Bld 238 (*)    All other components within normal limits  PRO B NATRIURETIC PEPTIDE - Abnormal; Notable for the following:    Pro B Natriuretic peptide (BNP) 3688.0 (*)    All other components within normal limits  CARDIAC PANEL(CRET KIN+CKTOT+MB+TROPI)  CULTURE, BLOOD (ROUTINE X 2)  LACTIC ACID, PLASMA  PROCALCITONIN  CULTURE, BLOOD (ROUTINE X 2)   Dg Chest 2  View  01/20/2012  *RADIOLOGY REPORT*  Clinical Data: Shortness of breath; chest tightness.  Recently diagnosed with pneumonia.  CHEST - 2 VIEW  Comparison: Chest radiograph performed 01/15/2012  Findings: The lungs are well-aerated.  There is persistent right basilar airspace opacification, perhaps mildly improved, and slightly more prominent left-sided airspace opacity, compatible with redistribution  of the patient's pneumonia.  Small bilateral pleural effusions are again seen, right greater than left.  There is no evidence of pneumothorax.  The heart is borderline normal in size; the mediastinal contour is within normal limits.  No acute osseous abnormalities are seen.  IMPRESSION:  1.  Interval redistribution of the patient's bilateral pneumonia, perhaps mildly improved at the right lung base, and mildly more prominent at the left lung.  As before, would perform follow-up chest radiograph after completion of treatment for pneumonia, to ensure resolution of airspace opacities. 2.  Small bilateral pleural effusions again noted, right greater than left.  Original Report Authenticated By: Tonia Ghent, M.D.     1. Community acquired pneumonia   2. Congestive heart failure       MDM  R/o CHF, worsening PNA, ACS.  Repeat xray, O2 as sat's are 84% on ra.   Patient continues to be hypoxic on room air however on supplemental oxygen he comes up to 92-93%. His lung sounds remained stable, albuterol nebulizer given for some wheezing and increased respiratory rate. His lab work shows a normal white blood cell count of 7300, hemoglobin is 10.7 which appears to be close to where the patient is at baseline.  Cardiac enzymes remain in a normal range, BNP at 3688, lactic acid in a normal range and basic metabolic panel shows hyperglycemia alone.  Due to the peripheral edema, the findings of increased infiltrates on the left side of the chest x-ray and the BNP which is close to 4000 I suspect there is some element  of congestive heart failure and that the patient will need some diuresis. Due 2 concern over hypoxia and peripheral fluid retention Will admit to the hospital to Dr. Juanetta Gosling, I have discussed the care with the primary physician who will admit. Lasix and Avelox given in emergency department. the patient has been taking his antibiotics at home cefuroxime   Disposition: Admit  Vida Roller, MD 01/20/12 0630

## 2012-01-21 LAB — CARDIAC PANEL(CRET KIN+CKTOT+MB+TROPI)
CK, MB: 3.3 ng/mL (ref 0.3–4.0)
Relative Index: INVALID (ref 0.0–2.5)
Total CK: 62 U/L (ref 7–232)

## 2012-01-21 LAB — BASIC METABOLIC PANEL
Chloride: 97 mEq/L (ref 96–112)
Creatinine, Ser: 0.89 mg/dL (ref 0.50–1.35)
GFR calc Af Amer: >90 mL/min (ref >90)
GFR calc non Af Amer: >90 mL/min (ref >90)
Potassium: 3.9 mEq/L (ref 3.5–5.1)

## 2012-01-21 LAB — CBC
Platelets: 266 10*3/uL (ref 150–400)
RDW: 14.7 % (ref 11.5–15.5)
WBC: 7.4 10*3/uL (ref 4.0–10.5)

## 2012-01-21 MED ORDER — LISINOPRIL 10 MG PO TABS: 10.0000 mg | ORAL_TABLET | Freq: Every day | ORAL | Status: DC

## 2012-01-21 MED ORDER — LEVOFLOXACIN 500 MG PO TABS: 500.0000 mg | ORAL_TABLET | Freq: Every day | ORAL | Status: AC

## 2012-01-21 NOTE — Progress Notes (Signed)
Pt discharged with instructions he verbalized understanding. Pt left the floor via w/c with staff in stable condition.  All questions answered.

## 2012-01-21 NOTE — Discharge Summary (Signed)
Physician Discharge Summary  Patient ID: [name] MRN: [id number] DOB/AGE: 01-21-1956 56 y.o. Primary Care Physician:Mance Vallejo L, MD, MD Admit date: 01/20/2012 Discharge date: 01/21/2012    Discharge Diagnoses:  Chest pain myocardial infarction ruled out Coronary artery occlusive disease Pneumonia community acquired Congestive heart failure Diabetes COPD History of colon cancer Hypertension Hyperlipidemia  Active Problems:  * No active hospital problems. *    Medication List  As of 01/21/2012  1:08 PM   STOP taking these medications         azithromycin 250 MG tablet         TAKE these medications         albuterol (2.5 MG/3ML) 0.083% nebulizer solution   Commonly known as: PROVENTIL   Take 2.5 mg by nebulization every 6 (six) hours as needed. For shortness of breath      albuterol 108 (90 BASE) MCG/ACT inhaler   Commonly known as: PROVENTIL HFA;VENTOLIN HFA   Inhale 2 puffs into the lungs every 6 (six) hours as needed. For shortness of breath      aspirin 325 MG tablet   Take 325 mg by mouth daily.      cefUROXime 500 MG tablet   Commonly known as: CEFTIN   Take 500 mg by mouth 2 (two) times daily.      furosemide 40 MG tablet   Commonly known as: LASIX   Take 40 mg by mouth daily.      HYDROcodone-acetaminophen 5-500 MG per tablet   Commonly known as: VICODIN   Take 1 tablet by mouth every 6 (six) hours as needed. For pain      insulin glargine 100 UNIT/ML injection   Commonly known as: LANTUS   Inject 15 Units into the skin 2 (two) times daily.      levofloxacin 500 MG tablet   Commonly known as: LEVAQUIN   Take 1 tablet (500 mg total) by mouth daily.      lisinopril 10 MG tablet   Commonly known as: PRINIVIL,ZESTRIL   Take 1 tablet (10 mg total) by mouth daily.      magnesium oxide 400 MG tablet   Commonly known as: MAG-OX   Take 400 mg by mouth 2 (two) times daily.      metFORMIN 500 MG tablet   Commonly known as: GLUCOPHAGE   Take 1,000 mg by mouth 2 (two) times daily with a meal.      nitroGLYCERIN 0.4 MG SL tablet   Commonly known as: NITROSTAT   Place 0.4 mg under the tongue every 5 (five) minutes x 3 doses as needed. For chest pain      penicillin v potassium 500 MG tablet   Commonly known as: VEETID   Take 500 mg by mouth 4 (four) times daily as needed. Takes when teeth hurt      promethazine 25 MG tablet   Commonly known as: PHENERGAN   Take 25 mg by mouth every 6 (six) hours as needed. For nausea      simvastatin 80 MG tablet   Commonly known as: ZOCOR   Take 40 mg by mouth at bedtime.      ticlopidine 250 MG tablet   Commonly known as: TICLID   Take 250 mg by mouth 2 (two) times daily.            Discharged Condition:Improved    Consults:none  Significant Diagnostic Studies: Dg Chest 2 View  01/20/2012  *RADIOLOGY REPORT*  Clinical Data: Shortness of  breath; chest tightness.  Recently diagnosed with pneumonia.  CHEST - 2 VIEW  Comparison: Chest radiograph performed 01/15/2012  Findings: The lungs are well-aerated.  There is persistent right basilar airspace opacification, perhaps mildly improved, and slightly more prominent left-sided airspace opacity, compatible with redistribution of the patient's pneumonia.  Small bilateral pleural effusions are again seen, right greater than left.  There is no evidence of pneumothorax.  The heart is borderline normal in size; the mediastinal contour is within normal limits.  No acute osseous abnormalities are seen.  IMPRESSION:  1.  Interval redistribution of the patient's bilateral pneumonia, perhaps mildly improved at the right lung base, and mildly more prominent at the left lung.  As before, would perform follow-up chest radiograph after completion of treatment for pneumonia, to ensure resolution of airspace opacities. 2.  Small bilateral pleural effusions again noted, right greater than left.  Original Report Authenticated By: Tonia Ghent, M.D.   Dg  Chest 2 View  01/15/2012  *RADIOLOGY REPORT*  Clinical Data: Shortness of breath.  Cough.  Congestion.  Tobacco use.  CHEST - 2 VIEW  Comparison: 03/29/2010  Findings: Right middle lobe and right lower lobe airspace opacity noted, suspicious for pneumonia.  Small bilateral pleural effusions are suggested on the lateral projection.  Heart size is within normal limits.  IMPRESSION:  1.  Airspace opacities in the right lower lobe and right middle lobe suggest pneumonia.  Follow-up imaging to clearance is recommended to exclude underlying lung cancer. 2.  Small bilateral pleural effusions.  Original Report Authenticated By: Dellia Cloud, M.D.    Lab Results: Basic Metabolic Panel:  Basename 01/21/12 0338 01/20/12 0430  NA 137 134*  K 3.9 4.0  CL 97 99  CO2 34* 27  GLUCOSE 146* 238*  BUN 12 14  CREATININE 0.89 0.83  CALCIUM 9.7 8.9  MG -- --  PHOS -- --   Liver Function Tests: No results found for this basename: AST:2,ALT:2,ALKPHOS:2,BILITOT:2,PROT:2,ALBUMIN:2 in the last 72 hours   CBC:  Basename 01/21/12 0338 01/20/12 0430  WBC 7.4 7.3  NEUTROABS -- 5.8  HGB 11.1* 10.7*  HCT 35.7* 33.3*  MCV 86.9 86.0  PLT 266 269    Recent Results (from the past 240 hour(s))  CULTURE, BLOOD (ROUTINE X 2)     Status: Normal (Preliminary result)   Collection Time   01/20/12  4:30 AM      Component Value Range Status Comment   Specimen Description BLOOD LEFT ARM DRAWN BY RN   Final    Special Requests     Final    Value: BOTTLES DRAWN AEROBIC AND ANAEROBIC AEB=6CC ANA=5CC   Culture NO GROWTH 1 DAY   Final    Report Status PENDING   Incomplete   CULTURE, BLOOD (ROUTINE X 2)     Status: Normal (Preliminary result)   Collection Time   01/20/12  6:21 AM      Component Value Range Status Comment   Specimen Description BLOOD RIGHT ARM   Final    Special Requests BOTTLES DRAWN AEROBIC AND ANAEROBIC 6CC   Final    Culture NO GROWTH 1 DAY   Final    Report Status PENDING   Incomplete       Hospital Course: he came to the Emergency room because of chest pain. He had been seen about a week previously because of pneumonia. The pain in his chest was on the left side and was somewhat pleuritic but also somewhat reminiscent of his cardiac  pain. He also had more fluid retention.his chest x-ray showed that the pneumonia had redistributed. He was admitted and ruled out for myocardial infarction. He was treated with intravenous antibiotics. He was given intravenous diuresis.he was markedly improved after approximately 48 hours and discharged home  Discharge Exam: Blood pressure 137/85, pulse 84, temperature 98 F (36.7 C), temperature source Oral, resp. rate 20, height 6' (1.829 m), weight 78.8 kg (173 lb 11.6 oz), SpO2 96.00%. He was awake and alert. His chest was much clearer. His heart was regular. He still had edema of both lower extremities but it was much improved from admission  Disposition: home. We discussed home health services but he did not want those services    Follow-up Information    Follow up with [name] Gandolfi L, MD today.   Contact information:   [address] [place] [address] [phone]          Signed: Fredirick Maudlin Pager [phone]  01/21/2012, 1:08 PM

## 2012-01-21 NOTE — Discharge Instructions (Signed)
Heart Failure  Heart failure (HF) means your heart has trouble pumping blood. The blood is not circulated very well in your body because your heart is weak. HF may cause blood to back up into your lungs. This is commonly called "fluid in the lungs." HF may also cause your ankles and legs to puff up (swell). It is important to take good care of yourself when you have HF.  HOME CARE  Medicine  · Take your medicine as told by your doctor.  · Do not stop taking your medicine unless told to by your doctor.  · Be sure to get your medicine refilled before it runs out.  · Do not skip any doses of medicine.  · Tell your doctor if you cannot afford your medicine.  · Keep a list of all the medicine you take. This should include the name, how much you take, and when you take it.  · Ask your doctor if you have any questions about your medicine. Do not take over-the-counter medicine unless your doctor says it is okay.  What you eat  · Do not drink alcohol unless your doctor says it is okay.  · Avoid food that is high in fat. Avoid foods fried in oil or made with fat.  · Eat a healthy diet. A dietitian can help you with healthy food choices.  · Limit how much salt you eat. Do not eat more than 1500 milligrams (mg) of salt (sodium) a day.  · Do not add salt to your food.  · Do not eat food made with a lot of salt. Here are some examples:  · Canned vegetables.  · Canned soups.  · Canned drinks.  · Hot dogs.  · Fast food.  · Pizza.  · Chips.  Check your weight  · Weigh yourself every morning. You should do this after you pee (urinate) and before you eat breakfast.  · Wear the same amount of clothes each time you weigh yourself.  · Write down your weight every day. Tell your doctor if you gain 3 lb/1.4 kg or more in 1 day or 5 lb/2.3 kg in a week.  Blood pressure monitoring  · Buy a home blood pressure cuff.  · Check your blood pressure as told by your doctor. Write down your blood pressure numbers on a sheet of paper.  · Bring your  blood pressure numbers to your doctor visits.  Smoking  · Smoking is bad for your heart.  · Ask your doctor how to stop smoking.  Exercise  · Talk to your doctor about exercise.  · Ask how much exercise is right for you.  · Exercise as much as you can. Stop if you feel tired, have problems breathing, or have chest pain.  Keep all your doctor appointments.  GET HELP RIGHT AWAY IF:   · You have trouble breathing.  · You have a cough that does not go away.  · You cannot sleep because you have trouble breathing.  · You gain 3 lb/1.4 kg or more in 1 day or 5 lb/2.3 kg in a week.  · You have puffy ankles or legs.  · You have an enlarged (bloated) belly (abdomen).  · You pass out (faint).  · You have really bad chest pain or pressure. This includes pain or pressure in your:  · Arms.  · Jaw.  · Neck.  · Back.  If you have any of the above problems, call your local emergency services (  911 in [place] Do not drive yourself to the hospital.  MAKE SURE YOU:   · Understand these instructions.  · Will watch your condition.  · Will get help right away if you are not doing well or get worse.  Document Released: 06/29/2008 Document Revised: 09/09/2011 Document Reviewed: 01/21/2009  [employer]® Patient Information ©2012 [employer].    Pneumonia, Adult  Pneumonia is an infection of the lungs. It may be caused by a germ (virus or bacteria). Some types of pneumonia can spread easily from person to person. This can happen when you cough or sneeze.  HOME CARE  · Only take medicine as told by your doctor.  · Take your medicine (antibiotics) as told. Finish it even if you start to feel better.  · Do not smoke.  · You may use a vaporizer or humidifier in your room. This can help loosen thick spit (mucus).  · Sleep so you are almost sitting up (semi-upright). This helps reduce coughing.  · Rest.  A shot (vaccine) can help prevent pneumonia. Shots are often advised for:  · People over 65 years old.  · Patients on chemotherapy.  · People with  long-term (chronic) lung problems.  · People with immune system problems.  GET HELP RIGHT AWAY IF:   · You are getting worse.  · You cannot control your cough, and you are losing sleep.  · You cough up blood.  · Your pain gets worse, even with medicine.  · You have a fever.  · Any of your problems are getting worse, not better.  · You have shortness of breath or chest pain.  MAKE SURE YOU:   · Understand these instructions.  · Will watch your condition.  · Will get help right away if you are not doing well or get worse.  Document Released: 03/08/2008 Document Revised: 09/09/2011 Document Reviewed: 12/11/2010  Exit[employer]® Patient Information ©2012 [employer].

## 2012-01-23 NOTE — Progress Notes (Signed)
NAMEDANTHONY, KENDRIX                ACCOUNT NO.:  [id number]  MEDICAL RECORD NO.:  [id number]  LOCATION:                                 FACILITY:  PHYSICIAN:  Rashika Bettes L. Juanetta Gosling, M.D.DATE OF BIRTH:  1956-06-24  DATE OF PROCEDURE: DATE OF DISCHARGE:                                PROGRESS NOTE   Mr. Gunderson says he feels much better and wants to go home.  He has no complaints.  He says his breathing is better.  He is not short of breath.  He has not had any PND or orthopnea.  He is not coughing much and his chest pain has resolved.  PHYSICAL EXAMINATION:  GENERAL:  Shows that he is awake and alert and looks comfortable.  CHEST:  Clear. HEART:  Regular. ABDOMEN:  Soft. EXTREMITIES:  Showed still some edema, but this is chronic.  Assessment then is that he is much improved.  Plan is to discharge home.  I will plan to discharge him home.  He does not want Home Health Services.     Tally Mckinnon L. Juanetta Gosling, M.D.     ELH/MEDQ  D:  01/21/2012  T:  01/21/2012  Job:  [id number]

## 2012-01-25 LAB — CULTURE, BLOOD (ROUTINE X 2)

## 2012-01-27 NOTE — Progress Notes (Signed)
UR Chart Review Completed  

## 2012-07-07 ENCOUNTER — Encounter: Payer: Self-pay | Admitting: General Surgery

## 2012-07-10 ENCOUNTER — Ambulatory Visit: Payer: Medicare Other | Admitting: Gastroenterology

## 2012-07-10 ENCOUNTER — Encounter: Payer: Self-pay | Admitting: Gastroenterology

## 2012-07-10 ENCOUNTER — Ambulatory Visit (INDEPENDENT_AMBULATORY_CARE_PROVIDER_SITE_OTHER): Payer: Medicare Other | Admitting: Gastroenterology

## 2012-07-10 VITALS — BP 134/71 | HR 75 | Temp 97.6°F | Ht 72.0 in | Wt 169.8 lb

## 2012-07-10 DIAGNOSIS — R197 Diarrhea, unspecified: Secondary | ICD-10-CM

## 2012-07-10 DIAGNOSIS — K529 Noninfective gastroenteritis and colitis, unspecified: Secondary | ICD-10-CM | POA: Insufficient documentation

## 2012-07-10 DIAGNOSIS — D649 Anemia, unspecified: Secondary | ICD-10-CM

## 2012-07-10 DIAGNOSIS — Z85038 Personal history of other malignant neoplasm of large intestine: Secondary | ICD-10-CM

## 2012-07-10 MED ORDER — PEG-KCL-NACL-NASULF-NA ASC-C 100 G PO SOLR: 1.0000 | ORAL | Status: DC

## 2012-07-10 NOTE — Progress Notes (Signed)
Primary Care Physician:  Fredirick Maudlin, MD  Primary Gastroenterologist:  Roetta Sessions, MD   Chief Complaint  Patient presents with  . Colonoscopy    HPI:  [name] is a 56 y.o. male here to schedule f/u colonoscopy. He has a history of colon cancer diagnosed in 2006. He is status post subtotal colectomy. He had numerous polyps as well. He was felt to have limited stage disease. He did not receive adjuvant therapy. His mother had colon cancer he believes after age 22. Patient continues to have chronic diarrhea. At least five BMs per day, worse with meals. Has history of nocturnal diarrhea. No solid stools. He had a flexible sigmoidoscopy in 2007 by Dr. Jena Gauss. Essentially normal residual rectum status post total colectomy. Focal area of abnormality adjacent to the suture, biopsy was unremarkable. Distal terminal ileum appeared normal. According to the note he was given a trial of Questran the patient really doesn't remember this.No melena, brbpr. No abdominal pain. No anorexia. No heartburn. No dysphagia. No weight loss.   Current Outpatient Prescriptions  Medication Sig Dispense Refill  . albuterol (PROVENTIL HFA;VENTOLIN HFA) 108 (90 BASE) MCG/ACT inhaler Inhale 2 puffs into the lungs every 6 (six) hours as needed. For shortness of breath      . albuterol (PROVENTIL) (2.5 MG/3ML) 0.083% nebulizer solution Take 2.5 mg by nebulization every 6 (six) hours as needed. For shortness of breath      . aspirin 81 MG tablet Take 81 mg by mouth daily.      . carvedilol (COREG) 6.25 MG tablet Take 6.25 mg by mouth 2 (two) times daily with a meal.       . cyclobenzaprine (FLEXERIL) 10 MG tablet Take 10 mg by mouth 2 (two) times daily as needed.       . furosemide (LASIX) 40 MG tablet Take 40 mg by mouth daily.      [name] HYDROcodone-acetaminophen (VICODIN) 5-500 MG per tablet Take 1 tablet by mouth every 6 (six) hours as needed. For pain      . insulin glargine (LANTUS) 100 UNIT/ML injection Inject 20  Units into the skin 2 (two) times daily.       [name] lisinopril (PRINIVIL) 10 MG tablet Take 1 tablet (10 mg total) by mouth daily.  30 tablet  12  . magnesium oxide (MAG-OX) 400 MG tablet Take 400 mg by mouth 2 (two) times daily.      . metFORMIN (GLUCOPHAGE) 500 MG tablet Take 1,000 mg by mouth 2 (two) times daily with a meal.      . nitroGLYCERIN (NITROSTAT) 0.4 MG SL tablet Place 0.4 mg under the tongue every 5 (five) minutes x 3 doses as needed. For chest pain      . penicillin v potassium (VEETID) 500 MG tablet Take 500 mg by mouth 4 (four) times daily as needed. Takes when teeth hurt      . promethazine (PHENERGAN) 25 MG tablet Take 25 mg by mouth every 6 (six) hours as needed. For nausea      . simvastatin (ZOCOR) 80 MG tablet Take 40 mg by mouth at bedtime.      [name] venlafaxine XR (EFFEXOR-XR) 75 MG 24 hr capsule Take 75 mg by mouth daily.       . clopidogrel (PLAVIX) 75 MG tablet Take 75 mg by mouth daily.        Allergies as of 07/10/2012 - Review Complete 07/10/2012  Allergen Reaction Noted  . Vancomycin Itching and Rash 01/15/2012  Past Medical History  Diagnosis Date  . COPD (chronic obstructive pulmonary disease)   . Coronary artery disease     MI  . Diabetes mellitus   . Arthritis   . Cancer     colon 2006  . CHF (congestive heart failure)   . Hypertension   . Hyperlipidemia     Past Surgical History  Procedure Date  . Colon surgery 09/01/2005    subtotal colectomy  . Cardiac surgery     stents  . Ankle surgery     right  . Inguinal hernia repair   . Colonoscopy 08/17/2005    Pancolonic diverticula/Multiple rectal polyps removed as described above. The larger of the two likely responsible for intermittent hematochezia/ Multiple polyps at the hepatic flexure resected with a snare./ Large polypoid lesion growing out of the base of the cecum, just behind the  ileocecal valve. This was not felt to be amendable to endoscopic resection. Biopsied multiple times  .  Flexible sigmoidoscopy   06/13/2006    Essentially normal residual rectum status post total colectomy. anastomosis at 25cm.  Focal area of abnormality adjacent to suture, as described above, likely not significant, suspect granulation tissue, biopsied.  Anastomosis otherwise appeared normal.  Distal terminal ileum appeared normal    Family History  Problem Relation Age of Onset  . Colon cancer Mother     [name], ?>age 69.  [name] Heart disease Father   . Liver disease Neg Hx     History   Social History  . Marital Status: Married    Spouse Name: N/A    Number of Children: 1  . Years of Education: N/A   Occupational History  . disability    Social History Main Topics  . Smoking status: Current Every Day Smoker -- 1.0 packs/day    Types: Cigarettes  . Smokeless tobacco: Not on file  . Alcohol Use: No  . Drug Use: No  . Sexually Active: No   Other Topics Concern  . Not on file   Social History Narrative  . No narrative on file      ROS:  General: Negative for anorexia, weight loss, fever, chills, fatigue, weakness. Eyes: Negative for vision changes.  ENT: Negative for hoarseness, difficulty swallowing , nasal congestion. CV: Negative for chest pain, angina, palpitations, dyspnea on exertion, peripheral edema.  Respiratory: Negative for dyspnea at rest. Chronic dyspnea on exertion, cough. No sputum, wheezing.  GI: See history of present illness. GU:  Negative for dysuria, hematuria, urinary incontinence, urinary frequency, nocturnal urination.  MS: Negative for joint pain. Chronic low back pain.  Derm: Negative for rash or itching.  Neuro: Negative for weakness, abnormal sensation, seizure, frequent headaches, memory loss, confusion.  Psych: Negative for anxiety, depression, suicidal ideation, hallucinations.  Endo: Negative for unusual weight change.  Heme: Negative for bruising or bleeding. Allergy: Negative for rash or hives.    Physical Examination:  BP  134/71  Pulse 75  Temp 97.6 F (36.4 C) (Temporal)  Ht 6' (1.829 m)  Wt 169 lb 12.8 oz (77.021 kg)  BMI 23.03 kg/m2   General: Well-nourished, well-developed in no acute distress.  Head: Normocephalic, atraumatic.   Eyes: Conjunctiva pink, no icterus. Mouth: Oropharyngeal mucosa moist and pink , no lesions erythema or exudate. Teeth in poor repair. Neck: Supple without thyromegaly, masses, or lymphadenopathy.  Lungs: Clear to auscultation bilaterally.  Heart: Regular rate and rhythm, no murmurs rubs or gallops.  Abdomen: Bowel sounds are normal, nontender, nondistended, no  hepatosplenomegaly or masses, no abdominal bruits or    hernia , no rebound or guarding.   Rectal: not performed Extremities: No lower extremity edema. No clubbing or deformities.  Neuro: Alert and oriented x 4 , grossly normal neurologically.  Skin: Warm and dry, no rash or jaundice.   Psych: Alert and cooperative, normal mood and affect.    LABS:  Lab Results  Component Value Date   WBC 7.4 01/21/2012   HGB 11.1* 01/21/2012   HCT 35.7* 01/21/2012   MCV 86.9 01/21/2012   PLT 266 01/21/2012

## 2012-07-10 NOTE — Patient Instructions (Addendum)
We have scheduled you for colonoscopy/flex sig with Dr. Jena Gauss. Please see separate instructions. Day of your bowel prep, you will take half of your regular Lantus dose which should be 10 units twice a day. Take 1/2 your metformin dose which should be 500 mg twice a day. Continue aspirin and Plavix as before.

## 2012-07-11 ENCOUNTER — Encounter: Payer: Self-pay | Admitting: *Deleted

## 2012-07-11 NOTE — Progress Notes (Signed)
Faxed to PCP

## 2012-07-11 NOTE — Assessment & Plan Note (Addendum)
H/O colon cancer s/p colectomy in 2006. Last endoscopy of remaining bowel was in 2007. Patient has had chronic diarrhea. He also has chronic mild normocytic anemia dating as far back as 2008 per EPIC. I will have him check CBC and TTG. Plan for surveillance flexible sigmoidoscopy with Dr. Jena Gauss in near future.  I have discussed the risks, alternatives, benefits with regards to but not limited to the risk of reaction to medication, bleeding, infection, perforation and the patient is agreeable to proceed. Written consent to be obtained. He will receive Phenergan 25mg  IV 30 minutes before procedure to augment conscious sedation given history of chronic narcotic use.   After procedure, consider trial of Colestid or Questran for chronic diarrhea.

## 2012-07-11 NOTE — Progress Notes (Signed)
Per [facility]- pt is due for lab work. Called and informed pt- he stated he would have them done. Lab orders faxed to lab.

## 2012-07-11 NOTE — Addendum Note (Signed)
Addended by: Tiffany Kocher on: 07/11/2012 09:02 AM   Modules accepted: Orders

## 2012-07-12 ENCOUNTER — Ambulatory Visit: Payer: Medicare Other | Admitting: Cardiology

## 2012-07-12 ENCOUNTER — Encounter: Payer: Self-pay | Admitting: Cardiology

## 2012-07-12 DIAGNOSIS — C189 Malignant neoplasm of colon, unspecified: Secondary | ICD-10-CM | POA: Insufficient documentation

## 2012-07-12 DIAGNOSIS — I10 Essential (primary) hypertension: Secondary | ICD-10-CM | POA: Insufficient documentation

## 2012-07-12 DIAGNOSIS — I2510 Atherosclerotic heart disease of native coronary artery without angina pectoris: Secondary | ICD-10-CM | POA: Insufficient documentation

## 2012-07-12 DIAGNOSIS — Z720 Tobacco use: Secondary | ICD-10-CM | POA: Insufficient documentation

## 2012-07-12 DIAGNOSIS — E785 Hyperlipidemia, unspecified: Secondary | ICD-10-CM | POA: Insufficient documentation

## 2012-07-12 DIAGNOSIS — J449 Chronic obstructive pulmonary disease, unspecified: Secondary | ICD-10-CM | POA: Insufficient documentation

## 2012-07-12 DIAGNOSIS — D649 Anemia, unspecified: Secondary | ICD-10-CM | POA: Insufficient documentation

## 2012-07-12 DIAGNOSIS — E119 Type 2 diabetes mellitus without complications: Secondary | ICD-10-CM | POA: Insufficient documentation

## 2012-07-20 ENCOUNTER — Telehealth: Payer: Self-pay | Admitting: Internal Medicine

## 2012-07-20 NOTE — Telephone Encounter (Signed)
Taken care of

## 2012-07-20 NOTE — Telephone Encounter (Signed)
Pt called to cancel his colonoscopy and said he would call back to [facility] later

## 2012-07-21 NOTE — Progress Notes (Signed)
Please remind patient to have labs done. Need them done a few days before his flex sig if possible.

## 2012-07-25 NOTE — Progress Notes (Signed)
Mailed reminder letter to pt. 

## 2012-07-27 ENCOUNTER — Telehealth: Payer: Self-pay | Admitting: Internal Medicine

## 2012-07-27 ENCOUNTER — Encounter (HOSPITAL_COMMUNITY): Admission: RE | Payer: Self-pay | Source: Ambulatory Visit

## 2012-07-27 ENCOUNTER — Ambulatory Visit (HOSPITAL_COMMUNITY): Admission: RE | Admit: 2012-07-27 | Payer: Medicare Other | Source: Ambulatory Visit | Admitting: Internal Medicine

## 2012-07-27 SURGERY — SIGMOIDOSCOPY, FLEXIBLE
Anesthesia: Moderate Sedation

## 2012-07-27 NOTE — Telephone Encounter (Signed)
Pt is on the phone wanting to [facility] his tcs to the first of next month.

## 2012-07-27 NOTE — Telephone Encounter (Signed)
I explained he needed a current 30 day H&P and he would need to come in for OV and he stated that he would just call us back at a later time

## 2012-08-18 NOTE — Progress Notes (Signed)
Patient noncompliant with getting labs done.

## 2013-02-09 ENCOUNTER — Other Ambulatory Visit: Payer: Self-pay

## 2013-02-09 ENCOUNTER — Encounter (HOSPITAL_COMMUNITY)
Admission: EM | Disposition: A | Discharge status: Home / Self Care | Payer: Self-pay | Source: Home / Self Care | Attending: Cardiology

## 2013-02-09 ENCOUNTER — Inpatient Hospital Stay (HOSPITAL_COMMUNITY)
Admission: EM | Admit: 2013-02-09 | Discharge: 2013-02-10 | DRG: 226 | Disposition: A | Discharge status: Home / Self Care | Payer: Medicare Other | Attending: Cardiology | Admitting: Cardiology

## 2013-02-09 ENCOUNTER — Emergency Department (HOSPITAL_COMMUNITY): Payer: Medicare Other

## 2013-02-09 ENCOUNTER — Encounter (HOSPITAL_COMMUNITY): Payer: Self-pay | Admitting: *Deleted

## 2013-02-09 DIAGNOSIS — I442 Atrioventricular block, complete: Secondary | ICD-10-CM

## 2013-02-09 DIAGNOSIS — N179 Acute kidney failure, unspecified: Secondary | ICD-10-CM

## 2013-02-09 DIAGNOSIS — E875 Hyperkalemia: Secondary | ICD-10-CM | POA: Diagnosis present

## 2013-02-09 DIAGNOSIS — Z9181 History of falling: Secondary | ICD-10-CM

## 2013-02-09 DIAGNOSIS — F17200 Nicotine dependence, unspecified, uncomplicated: Secondary | ICD-10-CM | POA: Diagnosis present

## 2013-02-09 DIAGNOSIS — I2510 Atherosclerotic heart disease of native coronary artery without angina pectoris: Secondary | ICD-10-CM | POA: Diagnosis present

## 2013-02-09 DIAGNOSIS — I10 Essential (primary) hypertension: Secondary | ICD-10-CM | POA: Diagnosis present

## 2013-02-09 DIAGNOSIS — I2589 Other forms of chronic ischemic heart disease: Secondary | ICD-10-CM

## 2013-02-09 DIAGNOSIS — Z85038 Personal history of other malignant neoplasm of large intestine: Secondary | ICD-10-CM

## 2013-02-09 DIAGNOSIS — R55 Syncope and collapse: Secondary | ICD-10-CM | POA: Diagnosis present

## 2013-02-09 DIAGNOSIS — Z91199 Patient's noncompliance with other medical treatment and regimen due to unspecified reason: Secondary | ICD-10-CM

## 2013-02-09 DIAGNOSIS — I499 Cardiac arrhythmia, unspecified: Secondary | ICD-10-CM | POA: Diagnosis present

## 2013-02-09 DIAGNOSIS — J4489 Other specified chronic obstructive pulmonary disease: Secondary | ICD-10-CM | POA: Diagnosis present

## 2013-02-09 DIAGNOSIS — I252 Old myocardial infarction: Secondary | ICD-10-CM

## 2013-02-09 DIAGNOSIS — I5022 Chronic systolic (congestive) heart failure: Secondary | ICD-10-CM

## 2013-02-09 DIAGNOSIS — N170 Acute kidney failure with tubular necrosis: Secondary | ICD-10-CM | POA: Diagnosis present

## 2013-02-09 DIAGNOSIS — Z9119 Patient's noncompliance with other medical treatment and regimen: Secondary | ICD-10-CM

## 2013-02-09 DIAGNOSIS — I509 Heart failure, unspecified: Secondary | ICD-10-CM | POA: Diagnosis present

## 2013-02-09 DIAGNOSIS — J449 Chronic obstructive pulmonary disease, unspecified: Secondary | ICD-10-CM | POA: Diagnosis present

## 2013-02-09 DIAGNOSIS — I059 Rheumatic mitral valve disease, unspecified: Secondary | ICD-10-CM

## 2013-02-09 DIAGNOSIS — E119 Type 2 diabetes mellitus without complications: Secondary | ICD-10-CM | POA: Diagnosis present

## 2013-02-09 DIAGNOSIS — E785 Hyperlipidemia, unspecified: Secondary | ICD-10-CM | POA: Diagnosis present

## 2013-02-09 HISTORY — PX: TEMPORARY PACEMAKER INSERTION: SHX5471

## 2013-02-09 HISTORY — PX: PERMANENT PACEMAKER INSERTION: SHX5480

## 2013-02-09 HISTORY — PX: OTHER SURGICAL HISTORY: SHX169

## 2013-02-09 LAB — CBC
HCT: 38.2 % — ABNORMAL LOW (ref 39.0–52.0)
Hemoglobin: 12.8 g/dL — ABNORMAL LOW (ref 13.0–17.0)
MCH: 29.9 pg (ref 26.0–34.0)
MCV: 89.3 fL (ref 78.0–100.0)
Platelets: 147 10*3/uL — ABNORMAL LOW (ref 150–400)
RBC: 4.28 MIL/uL (ref 4.22–5.81)
WBC: 6.8 10*3/uL (ref 4.0–10.5)

## 2013-02-09 LAB — HEMOGLOBIN A1C
Hgb A1c MFr Bld: 8.7 % — ABNORMAL HIGH
Mean Plasma Glucose: 203 mg/dL — ABNORMAL HIGH

## 2013-02-09 LAB — TYPE AND SCREEN
ABO/RH(D): O POS
Antibody Screen: NEGATIVE

## 2013-02-09 LAB — BASIC METABOLIC PANEL
BUN: 32 mg/dL — ABNORMAL HIGH (ref 6–23)
CO2: 18 mEq/L — ABNORMAL LOW (ref 19–32)
CO2: 21 mEq/L (ref 19–32)
Calcium: 8.2 mg/dL — ABNORMAL LOW (ref 8.4–10.5)
Chloride: 107 mEq/L (ref 96–112)
Chloride: 107 mEq/L (ref 96–112)
Creatinine, Ser: 2.02 mg/dL — ABNORMAL HIGH (ref 0.50–1.35)
GFR calc Af Amer: 47 mL/min — ABNORMAL LOW (ref >90)
Glucose, Bld: 136 mg/dL — ABNORMAL HIGH (ref 70–99)
Potassium: 5 mEq/L (ref 3.5–5.1)

## 2013-02-09 LAB — POCT I-STAT, CHEM 8
BUN: 34 mg/dL — ABNORMAL HIGH (ref 6–23)
Calcium, Ion: 1.15 mmol/L (ref 1.12–1.23)
Creatinine, Ser: 2.5 mg/dL — ABNORMAL HIGH (ref 0.50–1.35)
Glucose, Bld: 150 mg/dL — ABNORMAL HIGH (ref 70–99)
Hemoglobin: 14.3 g/dL (ref 13.0–17.0)
TCO2: 20 mmol/L (ref 0–100)

## 2013-02-09 LAB — CBC WITH DIFFERENTIAL/PLATELET
Basophils Relative: 1 % (ref 0–1)
Eosinophils Absolute: 0.2 10*3/uL (ref 0.0–0.7)
MCH: 30.8 pg (ref 26.0–34.0)
MCHC: 34.5 g/dL (ref 30.0–36.0)
Monocytes Relative: 5 % (ref 3–12)
Neutrophils Relative %: 63 % (ref 43–77)
Platelets: 217 10*3/uL (ref 150–400)

## 2013-02-09 LAB — GLUCOSE, CAPILLARY
Glucose-Capillary: 78 mg/dL (ref 70–99)
Glucose-Capillary: 64 mg/dL — ABNORMAL LOW (ref 70–99)
Glucose-Capillary: 61 mg/dL — ABNORMAL LOW (ref 70–99)
Glucose-Capillary: 120 mg/dL — ABNORMAL HIGH (ref 70–99)
Glucose-Capillary: 167 mg/dL — ABNORMAL HIGH (ref 70–99)

## 2013-02-09 LAB — PHOSPHORUS: Phosphorus: 4.5 mg/dL (ref 2.3–4.6)

## 2013-02-09 LAB — MAGNESIUM: Magnesium: 2.6 mg/dL — ABNORMAL HIGH (ref 1.5–2.5)

## 2013-02-09 LAB — PROTIME-INR
INR: 1.01 (ref 0.00–1.49)
Prothrombin Time: 13.2 seconds (ref 11.6–15.2)

## 2013-02-09 LAB — TROPONIN I
Troponin I: <0.3 ng/mL (ref <0.30)
Troponin I: <0.3 ng/mL (ref <0.30)

## 2013-02-09 SURGERY — PERMANENT PACEMAKER INSERTION
Anesthesia: LOCAL

## 2013-02-09 SURGERY — TEMPORARY PACEMAKER INSERTION
Anesthesia: LOCAL | Laterality: Right

## 2013-02-09 MED ORDER — FENTANYL CITRATE 0.05 MG/ML IJ SOLN
50.0000 ug | Freq: Once | INTRAMUSCULAR | Status: AC
  Administered 2013-02-09: 01:00:00 via INTRAVENOUS

## 2013-02-09 MED ORDER — CHLORHEXIDINE GLUCONATE 4 % EX LIQD: 60.0000 mL | Freq: Once | CUTANEOUS | Status: AC

## 2013-02-09 MED ORDER — LIDOCAINE HCL (PF) 1 % IJ SOLN
INTRAMUSCULAR | Status: AC
  Filled 2013-02-09: qty 60

## 2013-02-09 MED ORDER — ACETAMINOPHEN 325 MG PO TABS: 325.0000 mg | ORAL_TABLET | ORAL | Status: DC | PRN

## 2013-02-09 MED ORDER — PERFLUTREN LIPID MICROSPHERE
1.0000 mL | INTRAVENOUS | Status: AC | PRN
  Administered 2013-02-09: 2 mL via INTRAVENOUS
  Filled 2013-02-09: qty 10

## 2013-02-09 MED ORDER — DEXTROSE 50 % IV SOLN
INTRAVENOUS | Status: AC
  Administered 2013-02-09: 25 mL
  Filled 2013-02-09: qty 50

## 2013-02-09 MED ORDER — FENTANYL CITRATE 0.05 MG/ML IJ SOLN
INTRAMUSCULAR | Status: AC
  Filled 2013-02-09: qty 2

## 2013-02-09 MED ORDER — SODIUM CHLORIDE 0.9 % IV SOLN
INTRAVENOUS | Status: DC
  Administered 2013-02-09: 11:00:00 via INTRAVENOUS

## 2013-02-09 MED ORDER — MAGNESIUM OXIDE 400 MG PO TABS
400.0000 mg | ORAL_TABLET | Freq: Two times a day (BID) | ORAL | Status: DC
  Administered 2013-02-09 – 2013-02-10 (×2): 400 mg via ORAL
  Filled 2013-02-09 (×3): qty 1

## 2013-02-09 MED ORDER — FENTANYL CITRATE 0.05 MG/ML IJ SOLN
50.0000 ug | Freq: Once | INTRAMUSCULAR | Status: DC
  Filled 2013-02-09: qty 2

## 2013-02-09 MED ORDER — MIDAZOLAM HCL 5 MG/5ML IJ SOLN
INTRAMUSCULAR | Status: AC
  Filled 2013-02-09: qty 5

## 2013-02-09 MED ORDER — SODIUM CHLORIDE 0.9 % IR SOLN
80.0000 mg | Status: DC
  Filled 2013-02-09: qty 2

## 2013-02-09 MED ORDER — HEPARIN (PORCINE) IN NACL 2-0.9 UNIT/ML-% IJ SOLN
INTRAMUSCULAR | Status: AC
  Filled 2013-02-09: qty 500

## 2013-02-09 MED ORDER — CHLORHEXIDINE GLUCONATE 4 % EX LIQD
CUTANEOUS | Status: AC
  Administered 2013-02-09: 10:00:00
  Filled 2013-02-09: qty 15

## 2013-02-09 MED ORDER — MAGNESIUM OXIDE 400 MG PO TABS: 400.0000 mg | ORAL_TABLET | Freq: Two times a day (BID) | ORAL | Status: DC

## 2013-02-09 MED ORDER — CHLORHEXIDINE GLUCONATE 4 % EX LIQD
CUTANEOUS | Status: AC
  Administered 2013-02-09: 10:00:00
  Filled 2013-02-09: qty 45

## 2013-02-09 MED ORDER — ALBUTEROL SULFATE HFA 108 (90 BASE) MCG/ACT IN AERS
2.0000 | INHALATION_SPRAY | Freq: Four times a day (QID) | RESPIRATORY_TRACT | Status: DC | PRN
  Filled 2013-02-09: qty 6.7

## 2013-02-09 MED ORDER — HEPARIN SODIUM (PORCINE) 5000 UNIT/ML IJ SOLN
5000.0000 [IU] | Freq: Three times a day (TID) | INTRAMUSCULAR | Status: DC
  Administered 2013-02-09: 5000 [IU] via SUBCUTANEOUS
  Filled 2013-02-09 (×4): qty 1

## 2013-02-09 MED ORDER — INSULIN ASPART 100 UNIT/ML ~~LOC~~ SOLN: 0.0000 [IU] | Freq: Three times a day (TID) | SUBCUTANEOUS | Status: DC

## 2013-02-09 MED ORDER — PERFLUTREN LIPID MICROSPHERE
INTRAVENOUS | Status: AC
  Filled 2013-02-09: qty 10

## 2013-02-09 MED ORDER — CEFAZOLIN SODIUM-DEXTROSE 2-3 GM-% IV SOLR
2.0000 g | INTRAVENOUS | Status: DC
  Filled 2013-02-09: qty 50

## 2013-02-09 MED ORDER — SERTRALINE HCL 50 MG PO TABS
50.0000 mg | ORAL_TABLET | Freq: Every day | ORAL | Status: DC
  Administered 2013-02-09 – 2013-02-10 (×2): 50 mg via ORAL
  Filled 2013-02-09 (×2): qty 1

## 2013-02-09 MED ORDER — DEXTROSE 50 % IV SOLN
INTRAVENOUS | Status: AC
  Filled 2013-02-09: qty 50

## 2013-02-09 MED ORDER — ASPIRIN 325 MG PO TABS
325.0000 mg | ORAL_TABLET | Freq: Every day | ORAL | Status: DC
  Administered 2013-02-09 – 2013-02-10 (×2): 325 mg via ORAL
  Filled 2013-02-09 (×2): qty 1

## 2013-02-09 MED ORDER — ACETAMINOPHEN 325 MG PO TABS
650.0000 mg | ORAL_TABLET | ORAL | Status: DC | PRN
  Administered 2013-02-09: 650 mg via ORAL
  Filled 2013-02-09: qty 2

## 2013-02-09 MED ORDER — SODIUM CHLORIDE 0.9 % IV SOLN: INTRAVENOUS | Status: DC

## 2013-02-09 MED ORDER — ONDANSETRON HCL 4 MG/2ML IJ SOLN: 4.0000 mg | Freq: Four times a day (QID) | INTRAMUSCULAR | Status: DC | PRN

## 2013-02-09 MED ORDER — LIDOCAINE HCL (PF) 1 % IJ SOLN
INTRAMUSCULAR | Status: AC
  Filled 2013-02-09: qty 30

## 2013-02-09 MED ORDER — CEFAZOLIN SODIUM-DEXTROSE 2-3 GM-% IV SOLR
2.0000 g | Freq: Four times a day (QID) | INTRAVENOUS | Status: AC
  Administered 2013-02-09 – 2013-02-10 (×3): 2 g via INTRAVENOUS
  Filled 2013-02-09 (×3): qty 50

## 2013-02-09 MED ORDER — ATORVASTATIN CALCIUM 40 MG PO TABS
40.0000 mg | ORAL_TABLET | Freq: Every day | ORAL | Status: DC
  Administered 2013-02-09: 40 mg via ORAL
  Filled 2013-02-09 (×2): qty 1

## 2013-02-09 MED ORDER — DEXTROSE 50 % IV SOLN: 25.0000 mL | Freq: Once | INTRAVENOUS | Status: AC | PRN

## 2013-02-09 MED ORDER — NITROGLYCERIN 0.4 MG SL SUBL: 0.4000 mg | SUBLINGUAL_TABLET | SUBLINGUAL | Status: DC | PRN

## 2013-02-09 NOTE — CV Procedure (Signed)
TEMPORARY PACEMAKER PROCEDURE NOTE   Indication: Third-degree heart block with recurrent syncope  Procedure: The right femoral area was sterilely prepped and draped in the usual sterile fashion in the catheterization laboratory. Venous access was a pain with a single pass with anterior wall entry into the femoral vein. A 5 French sheath was placed using the modified 70 technique. A balloontipped 5 [place] pacemaker catheter was then advanced to the right ventricular apex under fluoroscopic guidance. Pacing was begun and a threshold status to be less than 1 milliamp. The device was then set at a rate of 70 beats per minute with an output of 3 milliamps.  No complications occurred.  The device was sewn in place.

## 2013-02-09 NOTE — ED Notes (Signed)
Inconsistent capture with transcutaneous pacing; increased milliamps 80 and hr 70

## 2013-02-09 NOTE — ED Notes (Signed)
Inc. Dizziness and weakness x 4 days; today, symptoms much worse to the point of falling. Fell on the carpet. Hit back of head. Didn't hurt or nothing.

## 2013-02-09 NOTE — ED Notes (Signed)
MD at bedside.Cardiologists at bedside.  

## 2013-02-09 NOTE — ED Notes (Signed)
Vital signs stable. 

## 2013-02-09 NOTE — Op Note (Signed)
BiV ICD implant via the left subclavian vein without immediate complication. J#[id number].

## 2013-02-09 NOTE — H&P (Signed)
Cardiology History and Physical  [name] L, MD  History of Present Illness (and review of medical records): [name] is a 57 y.o. male who presents for evaluation of presyncope.  He has known hx of MI s/p PCI with BMS to LAD in 2007, HTN, DM, COPD, hx of colon ca s/p colectomy 2006 who presents with dizziness for the past 3-4 days.  He came in tonight because of a presyncopal episode while going to the bathroom tonight.  He was brought in by EMS and found to be bradycardic in mid 20s.  He was transcutaneously paced.  His EKG revealed complete heart block/ AV dissociation HR 26.  He reports no prior shortness of breath or chest pain.  He has some chest discomfort now with external pads.  Previous diagnostic testing for coronary artery disease includes: cardiac catheterization and echocardiogram. Previous history of cardiac disease includes Angina Cardiomyopathy Chest Pain Coronary Artery Disease Coronary Artery Stent Ischemic Heart Disease MI. Coronary artery disease risk factors include: advanced age (older than 21 for men, 103 for women), diabetes mellitus, dyslipidemia, hypertension, male gender and smoking/ tobacco exposure. Patient denies history of arrhythmia.  Review of Systems Further review of systems was otherwise negative other than stated in HPI.  Patient Active Problem List   Diagnosis Date Noted  . COPD (chronic obstructive pulmonary disease)   . Arteriosclerotic cardiovascular disease (ASCVD)   . Diabetes mellitus, type II   . Colon cancer   . Hypertension   . Hyperlipidemia   . Tobacco abuse   . Anemia, normocytic normochromic   . Chronic diarrhea 07/10/2012   Past Medical History  Diagnosis Date  . COPD (chronic obstructive pulmonary disease)   . Arteriosclerotic cardiovascular disease (ASCVD)     Acute MI in 2006->  LAD stent, EF-37%  . Diabetes mellitus, type II     With peripheral neuropathy  . Colon cancer 2006    Partial colectomy in 2006  .  Hypertension   . Hyperlipidemia   . Carpal tunnel syndrome   . Tobacco abuse   . Edema     venous insufficiency  . Anemia, normocytic normochromic 2008    2008  . Pneumonia 01/2012    01/2012; subsequent admission for chest pain  . H/O alcohol dependence     Past Surgical History  Procedure Laterality Date  . Subtotal colectomy  09/01/2005    subtotal colectomy  . Coronary angioplasty with stent placement    . Ankle surgery      Right  . Inguinal hernia repair    . Colonoscopy  08/17/2005    Pancolonic diverticula/Multiple rectal polyps removed as described above. The larger of the two likely responsible for intermittent hematochezia/ Multiple polyps at the hepatic flexure resected with a snare./ Large polypoid lesion growing out of the base of the cecum, just behind the  ileocecal valve. This was not felt to be amendable to endoscopic resection. Biopsied multiple times  . Flexible sigmoidoscopy    06/13/2006    Essentially normal residual rectum status post total colectomy. anastomosis at 25cm.  Focal area of abnormality adjacent to suture, as described above, likely not significant, suspect granulation tissue, biopsied.  Anastomosis otherwise appeared normal.  Distal terminal ileum appeared normal     (Not in a hospital admission) Allergies  Allergen Reactions  . Vancomycin Itching and Rash    History  Substance Use Topics  . Smoking status: Current Every Day Smoker -- 1.00 packs/day    Types: Cigarettes  .  Smokeless tobacco: Not on file  . Alcohol Use: No    Family History  Problem Relation Age of Onset  . Colon cancer Mother     [name], ?>age 61.  [name] Heart disease Father   . Liver disease Neg Hx   . Heart disease Mother      Objective: Patient Vitals for the past 8 hrs:  BP Temp Temp src Pulse Resp SpO2 Height Weight  02/09/13 0114 - - - - - - 6' (1.829 m) 76.204 kg (168 lb)  02/09/13 0100 151/69 mmHg - - 69 21 100 % - -  02/09/13 0055 - - - 70 22 99 % - -   02/09/13 0050 - - - 70 21 99 % - -  02/09/13 0049 140/68 mmHg - - 70 - - - -  02/09/13 0045 - - - 70 28 100 % - -  02/09/13 0040 - - - 71 42 100 % - -  02/09/13 0038 152/68 mmHg - - 70 - - - -  02/09/13 0035 - - - 84 38 99 % - -  02/09/13 0030 - - - 49 25 99 % - -  02/09/13 0026 - 98.5 F (36.9 C) Oral - 20 100 % - -  02/09/13 0025 - - - 31 22 99 % - -  02/09/13 0021 - - - 26 - - - -  02/09/13 0020 - - - 29 23 99 % - -  02/09/13 0015 - - - 38 28 100 % - -  02/09/13 0010 - - - - 12 - - -   General Appearance:    Alert, cooperative, mild distress,  Head:    Normocephalic, without obvious abnormality, atraumatic  Eyes:     Anicteric sclerae  Neck:   Supple  Lungs:     Clear to auscultation bilaterally, respirations unlabored  Heart:    bradycardicr  Abdomen:     Soft, non-tender, normoactive bowel sounds  Extremities:    no edema  Pulses:   Weak pulses  Skin:   cool  Neurologic:   No focal deficits. AAO x3   Results for orders placed during the hospital encounter of 02/09/13 (from the past 48 hour(s))  TYPE AND SCREEN     Status: None   Collection Time    02/09/13 12:12 AM      Result Value Range   ABO/RH(D) O POS     Antibody Screen NEG     Sample Expiration 02/12/2013    PROTIME-INR     Status: None   Collection Time    02/09/13 12:13 AM      Result Value Range   Prothrombin Time 13.2  11.6 - 15.2 seconds   INR 1.01  0.00 - 1.49  APTT     Status: None   Collection Time    02/09/13 12:13 AM      Result Value Range   aPTT 34  24 - 37 seconds  CBC WITH DIFFERENTIAL     Status: Abnormal   Collection Time    02/09/13 12:13 AM      Result Value Range   WBC 11.2 (*) 4.0 - 10.5 K/uL   RBC 4.61  4.22 - 5.81 MIL/uL   Hemoglobin 14.2  13.0 - 17.0 g/dL   HCT 82.9  56.2 - 13.0 %   MCV 89.2  78.0 - 100.0 fL   MCH 30.8  26.0 - 34.0 pg   MCHC 34.5  30.0 - 36.0 g/dL  RDW 13.6  11.5 - 15.5 %   Platelets 217  150 - 400 K/uL   Neutrophils Relative 63  43 - 77 %   Neutro Abs  7.0  1.7 - 7.7 K/uL   Lymphocytes Relative 30  12 - 46 %   Lymphs Abs 3.4  0.7 - 4.0 K/uL   Monocytes Relative 5  3 - 12 %   Monocytes Absolute 0.6  0.1 - 1.0 K/uL   Eosinophils Relative 2  0 - 5 %   Eosinophils Absolute 0.2  0.0 - 0.7 K/uL   Basophils Relative 1  0 - 1 %   Basophils Absolute 0.1  0.0 - 0.1 K/uL  ABO/RH     Status: None   Collection Time    02/09/13 12:25 AM      Result Value Range   ABO/RH(D) O POS    POCT I-STAT TROPONIN I     Status: None   Collection Time    02/09/13 12:29 AM      Result Value Range   Troponin i, poc 0.00  0.00 - 0.08 ng/mL   Comment 3            Comment: Due to the release kinetics of cTnI,     a negative result within the first hours     of the onset of symptoms does not rule out     myocardial infarction with certainty.     If myocardial infarction is still suspected,     repeat the test at appropriate intervals.  POCT I-STAT, CHEM 8     Status: Abnormal   Collection Time    02/09/13 12:31 AM      Result Value Range   Sodium 136  135 - 145 mEq/[name]   Potassium 5.0  3.5 - 5.1 mEq/[name]   Chloride 110  96 - 112 mEq/[name]   BUN 34 (*) 6 - 23 mg/dL   Creatinine, Ser 1.61 (*) 0.50 - 1.35 mg/dL   Glucose, Bld 096 (*) 70 - 99 mg/dL   Calcium, Ion 0.45  4.09 - 1.23 mmol/[name]   TCO2 20  0 - 100 mmol/[name]   Hemoglobin 14.3  13.0 - 17.0 g/dL   HCT 81.1  91.4 - 78.2 %   Dg Chest Portable 1 View  02/09/2013  *RADIOLOGY REPORT*  Clinical Data: 57 year old male chest pain and irregular heart rate.  PORTABLE CHEST - 1 VIEW  Comparison: 01/20/2012 and earlier.  Findings: Semi upright AP portable view 0016 hours.  Interval clearance of airspace disease at the right lung base.  Mild elevation of the right hemidiaphragm and linear opacity most resembling atelectasis persists.  Cardiac size at the upper limits of normal. Other mediastinal contours are within normal limits. Visualized tracheal air column is within normal limits.  No pneumothorax, pulmonary edema or pleural  effusion.  IMPRESSION: Mild atelectasis at the right lung base, otherwise no acute cardiopulmonary abnormality.   Original Report Authenticated By: Erskine Speed, M.D.     ECG:  HR 26 complete heart block, significant change from prior  Assessment: Complete heart block Acute renal insufficiency CAD HTN DM  Plan: 1. Cardiology Admission to ICU 2. Discussed case with Dr. Katrinka Blazing, will plan for temporary transvenous pacemaker. 3. TTE in am reassess LV function 4. EP consult for possible PPM/ICD 5. Will likely need dental consult 6. Gentle hydration, monitor Cr 7. ASA, Plavix, hold BB for now 8. SSI

## 2013-02-09 NOTE — ED Notes (Signed)
MD at bedside. 

## 2013-02-09 NOTE — Progress Notes (Signed)
Received order however it was generated from "If PCI" order. No PCI. Pt N/A for CR at this time. Please reorder if ambulation warranted. ThxEthelda Chick CES, ACSM 7:45 AM 02/09/2013

## 2013-02-09 NOTE — ED Provider Notes (Addendum)
History     CSN: [id number]  Arrival date & time 02/09/13  0006   First MD Initiated Contact with Patient 02/09/13 0008      Chief complaint - weakness   Patient is a 57 y.o. male presenting with weakness. The history is provided by the patient and the EMS personnel. The history is limited by the condition of the patient.  Weakness This is a new problem. The current episode started more than 2 days ago. The problem occurs daily. The problem has been gradually worsening. Associated symptoms include shortness of breath. Pertinent negatives include no chest pain. The symptoms are aggravated by exertion. The symptoms are relieved by rest. He has tried rest for the symptoms. The treatment provided mild relief.  pt reports he has felt fatigue for past 3 days No syncope No active CP No vomiting/diarrhea   Past Medical History  Diagnosis Date  . COPD (chronic obstructive pulmonary disease)   . Arteriosclerotic cardiovascular disease (ASCVD)     Acute MI in 2006->  LAD stent, EF-37%  . Diabetes mellitus, type II     With peripheral neuropathy  . Colon cancer 2006    Partial colectomy in 2006  . Hypertension   . Hyperlipidemia   . Carpal tunnel syndrome   . Tobacco abuse   . Edema     venous insufficiency  . Anemia, normocytic normochromic 2008    2008  . Pneumonia 01/2012    01/2012; subsequent admission for chest pain  . H/O alcohol dependence     Past Surgical History  Procedure Laterality Date  . Subtotal colectomy  09/01/2005    subtotal colectomy  . Coronary angioplasty with stent placement    . Ankle surgery      Right  . Inguinal hernia repair    . Colonoscopy  08/17/2005    Pancolonic diverticula/Multiple rectal polyps removed as described above. The larger of the two likely responsible for intermittent hematochezia/ Multiple polyps at the hepatic flexure resected with a snare./ Large polypoid lesion growing out of the base of the cecum, just behind the  ileocecal  valve. This was not felt to be amendable to endoscopic resection. Biopsied multiple times  . Flexible sigmoidoscopy    06/13/2006    Essentially normal residual rectum status post total colectomy. anastomosis at 25cm.  Focal area of abnormality adjacent to suture, as described above, likely not significant, suspect granulation tissue, biopsied.  Anastomosis otherwise appeared normal.  Distal terminal ileum appeared normal    Family History  Problem Relation Age of Onset  . Colon cancer Mother     Maisen Klingler, ?>age 39.  [name] Heart disease Father   . Liver disease Neg Hx   . Heart disease Mother     History  Substance Use Topics  . Smoking status: Current Every Day Smoker -- 1.00 packs/day    Types: Cigarettes  . Smokeless tobacco: Not on file  . Alcohol Use: No      Review of Systems  Unable to perform ROS: Unstable vital signs  Respiratory: Positive for shortness of breath.   Cardiovascular: Negative for chest pain.  Neurological: Positive for weakness.    Allergies  Vancomycin  Home Medications   Current Outpatient Rx  Name  Route  Sig  Dispense  Refill  . albuterol (PROVENTIL HFA;VENTOLIN HFA) 108 (90 BASE) MCG/ACT inhaler   Inhalation   Inhale 2 puffs into the lungs every 6 (six) hours as needed. For shortness of breath         .  albuterol (PROVENTIL) (2.5 MG/3ML) 0.083% nebulizer solution   Nebulization   Take 2.5 mg by nebulization every 6 (six) hours as needed. For shortness of breath         . aspirin 81 MG tablet   Oral   Take 81 mg by mouth daily.         . carvedilol (COREG) 6.25 MG tablet   Oral   Take 6.25 mg by mouth 2 (two) times daily with a meal.          . clopidogrel (PLAVIX) 75 MG tablet   Oral   Take 75 mg by mouth daily.         . cyclobenzaprine (FLEXERIL) 10 MG tablet   Oral   Take 10 mg by mouth 2 (two) times daily as needed.          . furosemide (LASIX) 40 MG tablet   Oral   Take 40 mg by mouth daily.         [name]  HYDROcodone-acetaminophen (VICODIN) 5-500 MG per tablet   Oral   Take 1 tablet by mouth every 6 (six) hours as needed. For pain         . insulin glargine (LANTUS) 100 UNIT/ML injection   Subcutaneous   Inject 20 Units into the skin 2 (two) times daily.          [name] EXPIRED: lisinopril (PRINIVIL) 10 MG tablet   Oral   Take 1 tablet (10 mg total) by mouth daily.   30 tablet   12   . magnesium oxide (MAG-OX) 400 MG tablet   Oral   Take 400 mg by mouth 2 (two) times daily.         . metFORMIN (GLUCOPHAGE) 500 MG tablet   Oral   Take 1,000 mg by mouth 2 (two) times daily with a meal.         . nitroGLYCERIN (NITROSTAT) 0.4 MG SL tablet   Sublingual   Place 0.4 mg under the tongue every 5 (five) minutes x 3 doses as needed. For chest pain         . simvastatin (ZOCOR) 80 MG tablet   Oral   Take 40 mg by mouth at bedtime.         [name] venlafaxine XR (EFFEXOR-XR) 75 MG 24 hr capsule   Oral   Take 75 mg by mouth daily.           Severe bradycardia noted   Physical Exam CONSTITUTIONAL: awake/alert but ill appearing HEAD: Normocephalic/atraumatic EYES: EOMI ENMT: Mucous membranes moist NECK: supple no meningeal signs SPINE:entire spine nontender ZO:[id number], no murmurs LUNGS: Lungs are clear to auscultation bilaterally, no apparent distress ABDOMEN: soft, nontender, no rebound or guarding GU:no cva tenderness NEURO: Pt is awake/alert, moves all extremitiesx4 EXTREMITIES: pulses normal, full ROM SKIN: warm, color normal PSYCH: no abnormalities of mood noted  ED Course  External pacer Date/Time: 02/09/2013 12:27 AM Performed by: Joya Gaskins Authorized by: Joya Gaskins Consent: Verbal consent obtained. The procedure was performed in an emergent situation. Patient tolerance: Patient tolerated the procedure well with no immediate complications. Comments: Patient externally paced while in the ED.  SBP >100 Pt tolerated well     CRITICAL  CARE Performed by: Joya Gaskins Total critical care time: 40 Critical care time was exclusive of separately billable procedures and treating other patients. Critical care was necessary to treat or prevent imminent or life-threatening deterioration. Critical care was time spent personally by me  on the following activities: development of treatment plan with patient and/or surrogate as well as nursing, discussions with consultants, evaluation of patient's response to treatment, examination of patient, obtaining history from patient or surrogate, ordering and performing treatments and interventions, ordering and review of laboratory studies, ordering and review of radiographic studies, pulse oximetry and re-evaluation of patient's condition.   Labs Reviewed  PROTIME-INR  APTT  CBC WITH DIFFERENTIAL  TYPE AND SCREEN  12:23 AM Pt seen on arrival, he appears to be in complete heart block.  He tolerated External pacing from EMS.  He was transferred to over our external pacing (he had brief convulsion on arrival then came back to baseline, likely due to bradycardia) He is awake/alert, answering questions on our external pacing SBP >100 I spoke to on call [place], will see patient 1:14 AM Cardiology here to see patient Will have pacemaker placed emergently Repeat vitals: BP 151/69  Pulse 69  Temp(Src) 98.5 F (36.9 C) (Oral)  Resp 21  Ht 6' (1.829 m)  Wt 168 lb (76.204 kg)  BMI 22.78 kg/m2  SpO2 100%   MDM  Nursing notes including past medical history and social history reviewed and considered in documentation Labs/vital reviewed and considered xrays reviewed and considered     Date: 02/09/2013  Rate: 26  Rhythm: sinus  QRS Axis: left  Intervals: PR prolonged  ST/T Wave abnormalities: nonspecific ST changes  Conduction Disutrbances:complete heart block  Narrative Interpretation:   Old EKG Reviewed: none available at time of interepretation          Joya Gaskins, MD 02/09/13 1610  Joya Gaskins, MD 02/09/13 [phone]

## 2013-02-09 NOTE — Progress Notes (Signed)
Echocardiogram 2D Echocardiogram with Definity has been performed.  [name] 02/09/2013, 9:31 AM

## 2013-02-09 NOTE — Progress Notes (Signed)
Chaplain Note:  Chaplain visited with pt and pt's family.  Pt was in cath lab being treated by the Cath Lab team.  Pt's family was in the cath lab waiting area.  During the pt's procedure, chaplain provided spiritual comfort and support for pt, pt's family and cath lab team.  Chaplain supported Cath lab team by acting as liaison between lab and family.  Pt, family, and cath lab team expressed appreciation for chaplain support.  02/09/13 0100  Clinical Encounter Type  Visited With Patient and family together  Visit Type Spiritual support  Referral From Other (Comment) (Page to Cath Lab Team)  Spiritual Encounters  Spiritual Needs Emotional  Stress Factors  Patient Stress Factors Health changes;Major life changes  Family Stress Factors Major life changes  [name], [place] [phone]

## 2013-02-09 NOTE — Consult Note (Signed)
ELECTROPHYSIOLOGY CONSULT NOTE  Patient ID: [name] MRN: [id number], DOB/AGE: [birth year]   Admit date: 02/09/2013 Date of Consult: 02/09/2013  Primary Physician: [name] Baars, MD Primary Cardiologist: Previously [name] Ohm, MD / also seen by [name] in 2011 Reason for Consultation: Complete heart block  History of Present Illness [name] is a 57 year old man with known CAD s/p anterior STEMI and BMS PCI to mid-LAD May 2006, in-stent restenosis requiring DES PCI to mid-LAD Feb 2007, ischemic CM documented since 2006 (most recent echo 2011, EF 25-30%), chronic systolic HF, HTN, DM, COPD and polycythemia vera who has been admitted with CHB.   [name] was admitted in June 2011 with syncope, found to have CHB in setting of ARF and hyperkalemia (K 9.6 at that time). He was acidotic, critically ill and intubated. He was started on IV dopamine infusion but ultimately required temporary pacing. According to his records, his rhythm improved with correction/treatment of ARF and hyperkalemia. However, he was scheduled for BiV ICD implantation at [facility] prior to that hospitalization; however, this was never done. Apparently [name] was felt to be high risk for infection. He has been lost to follow-up since that time.  [name] presented early this AM with weakness, dizziness and SOB that started 2 days ago. He denies CP, palpitations or syncope. He was found to have CHB and was transcutaneously pacing on arrival. He had intermittent loss of capture with transcutaneous pacing and was hemodynamically unstable; therefore, he underwent temporary pacing wire placement emergently. Serum Cr 2.02. K 5.9 initially but hemolyzed. Repeat K 5.0. Mg 2.6. Troponin negative x1. He is now V paced at 70 with underlying persistent CHB in the 20s when temp pacing turned down. Echo pending. He reports non-compliance with his medications for 2 months due to financial difficulties. He has been taking his cardiac  medications every other day.   Past Medical History Past Medical History  Diagnosis Date  . COPD (chronic obstructive pulmonary disease)   . Arteriosclerotic cardiovascular disease (ASCVD)     Acute MI in 2006->  LAD stent, EF-37%  . Diabetes mellitus, type II     With peripheral neuropathy  . Colon cancer 2006    Partial colectomy in 2006  . Hypertension   . Hyperlipidemia   . Carpal tunnel syndrome   . Tobacco abuse   . Edema     venous insufficiency  . Anemia, normocytic normochromic 2008    2008  . Pneumonia 01/2012    01/2012; subsequent admission for chest pain  . H/O alcohol dependence     Past Surgical History Past Surgical History  Procedure Laterality Date  . Subtotal colectomy  09/01/2005    subtotal colectomy  . Coronary angioplasty with stent placement    . Ankle surgery      Right  . Inguinal hernia repair    . Colonoscopy  08/17/2005    Pancolonic diverticula/Multiple rectal polyps removed as described above. The larger of the two likely responsible for intermittent hematochezia/ Multiple polyps at the hepatic flexure resected with a snare./ Large polypoid lesion growing out of the base of the cecum, just behind the  ileocecal valve. This was not felt to be amendable to endoscopic resection. Biopsied multiple times  . Flexible sigmoidoscopy    06/13/2006    Essentially normal residual rectum status post total colectomy. anastomosis at 25cm.  Focal area of abnormality adjacent to suture, as described above, likely not significant, suspect granulation tissue, biopsied.  Anastomosis  otherwise appeared normal.  Distal terminal ileum appeared normal    Allergies/Intolerances Allergies  Allergen Reactions  . Vancomycin Itching and Rash   Inpatient Medications . aspirin  325 mg Oral Daily  . atorvastatin  40 mg Oral q1800  . chlorhexidine      . chlorhexidine      . fentaNYL  50 mcg Intravenous Once  . heparin  5,000 Units Subcutaneous Q8H  . insulin aspart   0-9 Units Subcutaneous TID WC  . magnesium oxide  400 mg Oral BID  . perflutren lipid microspheres (DEFINITY) IV suspension      . sertraline  50 mg Oral Daily   . sodium chloride 50 mL/hr at 02/09/13 0315   Family History Family History  Problem Relation Age of Onset  . Colon cancer Mother     [name], ?>age 57.  [name] Heart disease Father   . Liver disease Neg Hx   . Heart disease Mother     Social History Social History  . Marital Status: Married   Occupational History  . disability    Social History Main Topics  . Smoking status: Current Every Day Smoker -- 1.00 packs/day    Types: Cigarettes  . Smokeless tobacco: No  . Alcohol Use: No  . Drug Use: No   Review of Systems General: No chills, fever, night sweats or weight changes  Cardiovascular:  No chest pain, edema, orthopnea, palpitations, paroxysmal nocturnal dyspnea Dermatological: No rash, lesions or masses Respiratory: No cough Urologic: No hematuria, dysuria Abdominal: No nausea, vomiting, diarrhea, bright red blood per rectum, melena, or hematemesis Neurologic: No visual changes, changes in mental status All other systems reviewed and are otherwise negative except as noted above.  Physical Exam Blood pressure 140/69, pulse 70, temperature 97.8 F (36.6 C), temperature source Oral, resp. rate 12, height 6' (1.829 m), weight 173 lb 4.5 oz (78.6 kg), SpO2 96.00%.  General: Well developed, well appearing, in no acute distress. HEENT: Normocephalic, atraumatic. EOMs intact. Sclera nonicteric. Oropharynx clear.  Neck: Supple without bruits. No JVD. Lungs: Respirations regular and unlabored, CTA bilaterally. No wheezes, rales or rhonchi. Heart: RRR. S1, S2 present. No murmurs, rub, S3 or S4. Abdomen: Soft, non-tender, non-distended. BS present x 4 quadrants. No hepatosplenomegaly.  Extremities: No clubbing, cyanosis or edema. DP/PT/Radials 2+ and equal bilaterally. Psych: Normal affect. Neuro: Alert and  oriented X 3. Moves all extremities spontaneously. Musculoskeletal: No kyphosis. Skin: Intact. Warm and dry. No rashes or petechiae in exposed areas.   Labs  Recent Labs  02/09/13 0830  TROPONINI <0.30   Lab Results  Component Value Date   WBC 6.8 02/09/2013   HGB 12.8* 02/09/2013   HCT 38.2* 02/09/2013   MCV 89.3 02/09/2013   PLT 147* 02/09/2013     Recent Labs Lab 02/09/13 0646  NA 137  K 5.0  CL 107  CO2 21  BUN 30*  CREATININE 1.81*  CALCIUM 8.6  GLUCOSE 116*    Recent Labs  02/09/13 0013  INR 1.01    Radiology/Studies Dg Chest Portable 1 View  02/09/2013  *RADIOLOGY REPORT*  Clinical Data: 57 year old male chest pain and irregular heart rate.  PORTABLE CHEST - 1 VIEW  Comparison: 01/20/2012 and earlier.  Findings: Semi upright AP portable view 0016 hours.  Interval clearance of airspace disease at the right lung base.  Mild elevation of the right hemidiaphragm and linear opacity most resembling atelectasis persists.  Cardiac size at the upper limits of normal. Other mediastinal contours are  within normal limits. Visualized tracheal air column is within normal limits.  No pneumothorax, pulmonary edema or pleural effusion.  IMPRESSION: Mild atelectasis at the right lung base, otherwise no acute cardiopulmonary abnormality.   Original Report Authenticated By: Odessa Fleming III, M.D.     Echocardiogram  pending  12-lead ECG from 01/21/2012 - SR with RBBB, QRS 144 msec 12-lead ECG on presentation today - V paced  Assessment and Plan 1. Complete heart block 2. Ischemic CM with chronic systolic HF 3. CAD s/p anterior STEMI 2006 4. ARF 5. COPD with continued tobacco abuse 6. Medical noncompliance  Mr. Manning presents with symptomatic CHB. He has known CAD and longstanding ischemic CM with chronic systolic HF. His POC troponin is negative and he does not report anginal symptoms. He has ARF which is likely due to severe bradycardia and hypoperfusion. He is now hemodynamically  stable s/p temporary pacing wire placement. He is taking low dose carvedilol at home and will need to continue this in setting of known CAD/prior AMI and ischemic CM. Therefore, he will need permanent pacing. His echo is pending but will probably show persistent LV dysfunction (this has been documented since 2006). In this setting, with an EF <50%, he will most likely benefit from CRT. In addition, if his EF <35%, he will meet criteria for BiV ICD for CRT and primary prevention SCD. Further recommendations pending the results of his echo. Dr. Ladona Ridgel to see.    Signed, Rick Duff, PA-C 02/09/2013, 9:35 AM  EP Attending  Patient seen and examined. I have reviewed his old ECG's, CXR and data. He now has complete heart block, requiring emergent temporary pacemaker. He has no escape at present. His temporary PM is in his leg. He has longstanding LV dysfunction and EF 25%. He also has an ICM and class 2 CHF. I have discussed the treatment options with the patient. His options are BiV PPM vs BiV ICD. With his underlying ICM, and severe LV dysfunction, not reversible, I have recommended insertion of a BiV ICD. The risks/benefits/goals and expectations of the device have been discussed with the patient and he wishes to proceed.  Leonia Reeves.D.

## 2013-02-09 NOTE — H&P (Signed)
57 year old gentleman with multiple medical problems including ischemic cardiomyopathy, diabetes mellitus, COPD, hypertension, hyperlipidemia, and prior history of colon cancer. He continues to be a heavy smoker. He is been having episodes of weakness and falling at home. He came in this evening and had third-degree heart block on EKG with effective heart rates in the 25-40 beats per minute range. He is having external pacing with intermittent capture. He is brought to the Cath Lab for temporary wire placement. The procedure and risks of temporary pacer insertion were discussed with the patient in detail.

## 2013-02-09 NOTE — Care Management Note (Signed)
    Page 1 of 1   02/09/2013     8:39:27 AM   CARE MANAGEMENT NOTE 02/09/2013  Patient:  [name], [name]   Account Number:  [id number]  Date Initiated:  02/09/2013  Documentation initiated by:  Junius Creamer  Subjective/Objective Assessment:   adm w complete heart block     Action/Plan:   lives w wife, pcp dr ed Juanetta Gosling   Anticipated DC Date:     Anticipated DC Plan:           Choice offered to / List presented to:             Status of service:   Medicare Important Message given?   (If response is "NO", the following Medicare IM given date fields will be blank) Date Medicare IM given:   Date Additional Medicare IM given:    Discharge Disposition:    Per UR Regulation:  Reviewed for med. necessity/level of care/duration of stay  If discussed at Long Length of Stay Meetings, dates discussed:    Comments:

## 2013-02-09 NOTE — Progress Notes (Signed)
Subjective:  The patient was admitted overnight with 3rd degree HB.  The patient went to the cath lab early this morning for temporary pacing wire placement.  He is currently paced with a HR around 70.  He notes no cp, SOB, palpitations, lightheadedness.  Objective:  Vital Signs in the last 24 hours: Temp:  [97.8 F (36.6 C)-98.5 F (36.9 C)] 97.8 F (36.6 C) (05/09 0400) Pulse Rate:  [26-84] 69 (05/09 0600) Resp:  [12-42] 14 (05/09 0600) BP: (128-154)/(67-83) 154/79 mmHg (05/09 0600) SpO2:  [96 %-100 %] 97 % (05/09 0600) Weight:  [168 lb (76.204 kg)-173 lb 4.5 oz (78.6 kg)] 173 lb 4.5 oz (78.6 kg) (05/09 0400)  Intake/Output from previous day: 05/08 0701 - 05/09 0700 In: 200 [I.V.:200] Out: 350 [Urine:350]  Physical Exam: General: alert, cooperative, and in no apparent distress HEENT: pupils equal round and reactive to light, vision grossly intact, oropharynx clear and non-erythematous  Neck: supple, no lymphadenopathy, no JVD Lungs: clear to ascultation bilaterally, normal work of respiration, no wheezes, rales, ronchi Heart: paced, regular rate and rhythm, no murmurs, gallops, or rubs Abdomen: soft, non-tender, non-distended, normal bowel sounds Extremities: no cyanosis, clubbing, or edema Neurologic: alert & oriented X3, cranial nerves II-XII intact, strength grossly intact, sensation intact to light touch  Lab Results:  Recent Labs  02/09/13 0013 02/09/13 0031 02/09/13 0350  WBC 11.2*  --  6.8  HGB 14.2 14.3 12.8*  PLT 217  --  147*    Recent Labs  02/09/13 0350 02/09/13 0646  NA 137 137  K 5.9* 5.0  CL 107 107  CO2 18* 21  GLUCOSE 136* 116*  BUN 32* 30*  CREATININE 2.02* 1.81*   No results found for this basename: TROPONINI, CK, MB,  in the last 72 hours  Cardiac Studies: Temporary Pacemaker Procedure Note 02/09/13 Procedure: The right femoral area was sterilely prepped and draped in the usual sterile fashion in the catheterization laboratory.  Venous access was a pain with a single pass with anterior wall entry into the femoral vein. A 5 French sheath was placed using the modified 70 technique. A balloontipped 5 [place] pacemaker catheter was then advanced to the right ventricular apex under fluoroscopic guidance. Pacing was begun and a threshold status to be less than 1 milliamp. The device was then set at a rate of 70 beats per minute with an output of 3 milliamps.  Prior Echocardiogram 03/28/2010 - Left ventricle: The cavity size was mildly dilated. Systolic function was moderately reduced. The estimated ejection fraction was in the range of 35% to 40%. Diffuse hypokinesis but more prominent hypocontractility involving the distal septum and apex. - Aortic valve: Trivial regurgitation. - Mitral valve: Mildly thickened leaflets . Mild regurgitation. - Left atrium: The atrium was mildly dilated.  Tele: Paced rhythm overnight in 70's, occasional PVC's  Assessment/Plan:  The patient is a 57 yo man, history of CAD s/p stent, presenting with 3rd degree HB.  # 3rd degree heart block - s/p temporary pacer wire placement 5/9. -ordered echo for today -patient will likely need PPM  # AKI - likely representing ATN given patient's bradycardia on admission.  Improved this morning to 1.8 from 2.5.  UOP of 350 cc's so far today. -monitor I/O's, trend BMET's -holding ACE-I  # CAD - history of stent to LAD in 2006 -continue aspirin -continue atorvastatin -holding BB, ACE-I for now  # DM2 -SSI  # Hyperkalemia - On labs this morning, K=5.9, likely representing hemolysis.  Repeat labs showed K = 5.0.  # Hypermagnesemia - the patient is on home Mg supplementation, though Mg this morning was mildly elevated at 2.6. -ok to continue home Mg supplementation -recheck Mg in AM  Janalyn Harder, M.D. 02/09/2013, 7:40 AM   Patient seen with resident, agree with the above note.  1. Complete heart block: I turned down his temp pacemaker this morning,  underlying rhythm is still CHB with rate in the 20s.  No complaints this morning, no more lightheadedness.  No chest pain prior to presentation, was just lightheaded and fell.  He says he was told in the past that he should get a pacemaker/defibrillator, but may have been due to low EF.  He says it was never put in because of bad teeth.  I did find one echo from 2011 that was done because of "complete heart block."   - He will need a pacemaker.  Will keep NPO.  I suspect that he should have ICD as well based on past history.  Will get stat echo pre-pacemaker. 2. AKI: May be due to hypotension in setting of CHB.  Creatinine is improving.  Continue to hold ACEI and Lasix for now.  3. Ischemic cardiomyopathy: Last echo in 2011 with EF 35-40%.  Was on Coreg 6.25 mg bid and ACEI at home.  Hold Coreg for now until PCM, hold ACEI with elevated creatinine.  Will get echo today.  4. CAD: History of MI.  Continue ASA/statin.  Holding Plavix for now.  Cardiac enzymes so far negative and no chest pain.  Cycling enzymes.   Marca Ancona 02/09/2013 8:46 AM

## 2013-02-09 NOTE — ED Notes (Signed)
Family at bedside. 

## 2013-02-10 ENCOUNTER — Inpatient Hospital Stay (HOSPITAL_COMMUNITY): Payer: Medicare Other

## 2013-02-10 ENCOUNTER — Other Ambulatory Visit: Payer: Self-pay

## 2013-02-10 LAB — BASIC METABOLIC PANEL
CO2: 26 mEq/L (ref 19–32)
Calcium: 8.6 mg/dL (ref 8.4–10.5)
Creatinine, Ser: 1 mg/dL (ref 0.50–1.35)
GFR calc non Af Amer: 82 mL/min — ABNORMAL LOW (ref >90)
Glucose, Bld: 63 mg/dL — ABNORMAL LOW (ref 70–99)
Sodium: 141 mEq/L (ref 135–145)

## 2013-02-10 LAB — GLUCOSE, CAPILLARY: Glucose-Capillary: 107 mg/dL — ABNORMAL HIGH (ref 70–99)

## 2013-02-10 LAB — CBC
MCH: 30.6 pg (ref 26.0–34.0)
MCHC: 34.3 g/dL (ref 30.0–36.0)
MCV: 89.1 fL (ref 78.0–100.0)
Platelets: 186 10*3/uL (ref 150–400)
RBC: 4.12 MIL/uL — ABNORMAL LOW (ref 4.22–5.81)
RDW: 13.5 % (ref 11.5–15.5)

## 2013-02-10 NOTE — Progress Notes (Signed)
02/10/2013 11:49 AM   Discharge instructions given and verbalized understanding.  Instructions also given to wife via phone and she verbalized understanding.   Saline lock removed. Awaiting ride. Shalika Arntz, Linnell Fulling

## 2013-02-10 NOTE — Op Note (Signed)
[name], [name]                ACCOUNT NO.:  [id number]  MEDICAL RECORD NO.:  [id number]  LOCATION:  2903                         FACILITY:  [facility]  PHYSICIAN:  Doylene Canning. Ladona Ridgel, MD    DATE OF BIRTH:  10/12/1955  DATE OF PROCEDURE:  02/09/2013 DATE OF DISCHARGE:                              OPERATIVE REPORT   PROCEDURE PERFORMED:  Insertion of a biventricular ICD.  INDICATION:  Ischemic cardiomyopathy, longstanding chronic systolic heart failure, with development of complete heart block status post insertion of a temporary transvenous pacemaker.  INTRODUCTION:  The patient is a 57 year old male with longstanding ischemic heart disease status post MI with chronic systolic heart failure.  He has a history of right bundle-branch block, and intermittent complete heart block, and he presented to the hospital today with complete heart block and a ventricular escape of 26 beats per minute.  He underwent insertion of a temporary transvenous pacemaker.  A 2D echo demonstrated severe left ventricular dysfunction, ejection fraction 20%.  Because the patient requires immediate insertion of a permanent pacing device and because of his severe cardiomyopathy and because of his chronic systolic heart failure, and because of his pacing induced left bundle-branch block, he is now referred for insertion of a biventricular ICD for all of the above reasons.  Of note, his chronic systolic heart failure has been present for over 5 years, and his left ventricular dysfunction has been present for over 5 years.  PROCEDURE:  After informed consent was obtained, the patient was taken to the diagnostic EP lab in a fasting state.  After usual preparation and draping, intravenous fentanyl and midazolam were given for sedation. A 30 mL of lidocaine was infiltrated into the left infraclavicular region.  A 7-cm incision was carried out over this region. Electrocautery was utilized to dissect down to the fascial  plane.  The left subclavian vein was then punctured x3.  The [employer] lead, serial [id number] was advanced into the right ventricle and the [employer] model [id number], serial [id number], active fixation bipolar lead was advanced into the right atrium.  Mapping was first carried out in the right ventricle at the final site on the RV septum.  The paced R-waves measured 13 mV.  The P- waves measured 2 mV.  The leads were actively fixed.  There was a satisfactory injury current on both atrial and RV leads.  The pacing impedance in the atrium was 545 and in the right ventricle 572. Threshold was a V at 0.5 millisecond in both.  With these satisfactory parameters, the patient was then prepared for LV lead insertion.  A 9.5- French sheath was advanced into the subclavian vein.  The [employer] guiding catheter along with a 6-French EP catheter was advanced into the right atrium and the coronary sinus was cannulated with modest difficulty.  Venography of the coronary sinus demonstrated a posterior vein and a lateral vein, which had a shepherd's crook. Initially attempts to cannulate the lateral vein resulted inability to advance over the angioplasty guidewire.  After multiple attempts with multiple different wires, it was deemed most appropriate to place the lead in the posterior vein.  This  was accomplished without particular difficulty.  The [facility] Scientific acuity steerable bipolar LV pacing lead, serial [id number] was advanced through the guiding catheter and into the posterior vein.  At a distance halfway from base to apex, the pacing impedance was 1400 ohms and threshold was a V at 0.5 milliseconds.  A 10 V pacing did not stimulate the diaphragm.  With these satisfactory parameters, the guiding catheter was liberated from the LV lead in the usual manner.  The leads were then secured to the subpectoral fascia with a figure-of-eight silk suture.   Sewing sleeve was secured with silk suture.  Electrocautery was utilized to make a subcutaneous pocket. Antibiotic irrigation was utilized to irrigate the pocket and electrocautery was utilized to assure hemostasis.  The [facility] Scientific biventricular ICD defibrillator generator serial number is [id number] and it was connected to the atrial RV and LV leads and placed back in the subcutaneous pocket.  The pocket was irrigated with antibiotic irrigation.  The incision was closed with 2-0 and 3-0 Vicryl.  At this point, I scrubbed out of the case to supervise the fibrillation threshold testing.  After the patient was more deeply sedated with intravenous fentanyl and Versed, VF was induced with T-wave shock.  A 21-joule shock was then delivered terminating ventricular fibrillation and restored sinus rhythm.  At this point, no additional defibrillation threshold testing was carried out.  Benzoin and Steri-Strips were painted on the skin.  A pressure dressing was applied, and the patient was returned to his room in satisfactory condition.  COMPLICATIONS:  There were no immediate procedure complications.  RESULTS:  Demonstrate successful insertion of a biventricular ICD in a patient with symptomatic complete heart block, and ischemic cardiomyopathy, chronic systolic heart failure, pacing induced left bundle-branch block, QRS duration of 180 milliseconds.     Doylene Canning. Ladona Ridgel, MD     GWT/MEDQ  D:  02/09/2013  T:  02/10/2013  Job:  [id number]

## 2013-02-10 NOTE — Discharge Summary (Addendum)
Physician Discharge Summary  Patient ID: [name] MRN: [id number] DOB/AGE: 04/05/1956 57 y.o.  Admit date: 02/09/2013 Discharge date: 02/10/2013  Primary Cardiologist: Lewayne Bunting, MD  Primary Physician: Kari Baars, MD   Primary Discharge Diagnosis: 1 Complete Heart Block/Cardiac dysrhythmia  - Status post BiV ICD  - Status post initial emergent temporary pacer  2 Syncope, recurrent  3 Chronic systolic heart failure, EF 20% by echo this admission  Secondary Discharge Diagnoses: Past Medical History  Diagnosis Date  . COPD (chronic obstructive pulmonary disease)   . Arteriosclerotic cardiovascular disease (ASCVD)     Acute MI in 2006->  LAD stent, EF-37%  . Diabetes mellitus, type II     With peripheral neuropathy  . Colon cancer 2006    Partial colectomy in 2006  . Hypertension   . Hyperlipidemia   . Carpal tunnel syndrome   . Tobacco abuse   . Edema     venous insufficiency  . Anemia, normocytic normochromic 2008    2008  . Pneumonia 01/2012    01/2012; subsequent admission for chest pain  . H/O alcohol dependence     Reason for Admission: 57 year old male, with ischemic cardiomyopathy and longstanding chronic SystolicHF EF 20% this admission, admitted directly from home for further evaluation and management of CHB with ventricular escape in the 25-40 bpm range. He was placed initially on external pacing, and taken directly to the catheterization lab for temporary wire placement.  Procedures: 2D Echocardiogram, 02/09/2013: Study Conclusions  - Left ventricle: The cavity size was moderately to severely dilated. Wall thickness was normal. Systolic function was severely reduced. The estimated ejection fraction was in the range of 20% to 25%. Diffuse hypokinesis. Findings consistent with left ventricular diastolic dysfunction. Doppler parameters are consistent with high ventricular filling pressure. - Mitral valve: Mild regurgitation. - Left atrium: The  atrium was mildly dilated. - Impressions: EF is somewhat worse than the prior study. With contrast injection, I do not see any definite LV apical clot. There is nothing in history about a pacemaker. But I question a wire in the IVC and RA, but not in the RV. Is it in the RVOT? Impressions:  - EF is somewhat worse than the prior study. With contrast injection, I do not see any definite LV apical clot. There is nothing in history about a pacemaker. But I question a wire in the IVC and RA, but not in the RV. Is it in the RVOT?    Hospital Course: Following initial emergent placement of a temporary pacemaker, by Dr. Verdis Prime, patient was referred to Dr. Lewayne Bunting in consultation for further recommendations. Given his long-standing history of severe ischemic cardiomyopathy (EF 20%), and chronic SHF, Dr. Ladona Ridgel recommended insertion of a BiV ICD [facility] Scientific dextrous model [id number], serial [id number]), completed later that same day, with no immediate complications. Patient was then cleared for discharge the following morning, in hemodynamically stable condition. Postop CXR negative for complications.  Patient to resume his home medications of carvedilol 6.25 twice a day and lisinopril 10 mg daily, following discharge.  Discharge Vitals: Blood pressure 158/64, pulse 64, temperature 98.2 F (36.8 C), temperature source Oral, resp. rate 16, height 6' (1.829 m), weight 173 lb 4.5 oz (78.6 kg), SpO2 96.00%.  Labs: Lab Results  Component Value Date   WBC 9.2 02/10/2013   HGB 12.6* 02/10/2013   HCT 36.7* 02/10/2013   MCV 89.1 02/10/2013   PLT 186 02/10/2013      Recent Labs Lab 02/10/13 0350  NA 141  K 4.4  CL 108  CO2 26  BUN 15  CREATININE 1.00  CALCIUM 8.6  GLUCOSE 63*    No results found for this basename: CHOL,  HDL,  LDLCALC,  TRIG    Lab Results  Component Value Date   DDIMER  Value: 0.44        AT THE INHOUSE ESTABLISHED CUTOFF VALUE OF 0.48 ug/mL FEU, THIS ASSAY HAS  BEEN DOCUMENTED IN THE LITERATURE TO HAVE A SENSITIVITY AND NEGATIVE PREDICTIVE VALUE OF AT LEAST 98 TO 99%.  THE TEST RESULT SHOULD BE CORRELATED WITH AN ASSESSMENT OF THE CLINICAL PROBABILITY OF DVT / VTE. 03/26/2010    Lab Results  Component Value Date   TSH 3.144 Test methodology is 3rd generation TSH 03/26/2010     Recent Labs  02/09/13 0830 02/09/13 1300  TROPONINI <0.30 <0.30    Diagnostic Studies: Dg Chest 2 View  02/10/2013  *RADIOLOGY REPORT*  Clinical Data: Status post placement of implanted cardiac defibrillator.  CHEST - 2 VIEW  Comparison: 02/09/2013  Findings: Newly placed biventricular ICD device via the left subclavian vein shows appropriate positioning.  No evidence of abnormalities involving right atrial, right ventricular or coronary sinus leads by chest x-ray.  No pneumothorax or edema is identified.  No evidence of pleural fluid.  Scarring and atelectasis present at the right lung base.  The heart size is stable.  IMPRESSION: No complications evident by chest x-ray after insertion of a biventricular ICD device.   Original Report Authenticated By: Irish Lack, M.D.    Dg Chest Portable 1 View  02/09/2013  *RADIOLOGY REPORT*  Clinical Data: 57 year old male chest pain and irregular heart rate.  PORTABLE CHEST - 1 VIEW  Comparison: 01/20/2012 and earlier.  Findings: Semi upright AP portable view 0016 hours.  Interval clearance of airspace disease at the right lung base.  Mild elevation of the right hemidiaphragm and linear opacity most resembling atelectasis persists.  Cardiac size at the upper limits of normal. Other mediastinal contours are within normal limits. Visualized tracheal air column is within normal limits.  No pneumothorax, pulmonary edema or pleural effusion.  IMPRESSION: Mild atelectasis at the right lung base, otherwise no acute cardiopulmonary abnormality.   Original Report Authenticated By: Erskine Speed, M.D.      DISPOSITION: Stable condition  FOLLOW UP  PLANS AND APPOINTMENTS: Discharge Orders   Future Orders Complete By Expires     Diet - low sodium heart healthy  As directed     Increase activity slowly  As directed           Follow-up Information   Follow up with Device clinic.   Contact information:   Arrangements to be made through our office      Follow up with Lewayne Bunting, MD In 3 months. (Arrangements to be made through our office)    Contact information:   [address] [place] [address] [phone]       DISCHARGE MEDICATIONS:   Medication List    TAKE these medications       albuterol (2.5 MG/3ML) 0.083% nebulizer solution  Commonly known as:  PROVENTIL  Take 2.5 mg by nebulization every 6 (six) hours as needed. For shortness of breath     albuterol 108 (90 BASE) MCG/ACT inhaler  Commonly known as:  PROVENTIL HFA;VENTOLIN HFA  Inhale 2 puffs into the lungs every 6 (six) hours as needed. For shortness of breath     aspirin 325  MG tablet  Take 325 mg by mouth daily.     carvedilol 6.25 MG tablet  Commonly known as:  COREG  Take 6.25 mg by mouth 2 (two) times daily with a meal.     clopidogrel 75 MG tablet  Commonly known as:  PLAVIX  Take 75 mg by mouth daily.     furosemide 40 MG tablet  Commonly known as:  LASIX  Take 40 mg by mouth daily.     insulin glargine 100 UNIT/ML injection  Commonly known as:  LANTUS  Inject 20 Units into the skin 2 (two) times daily.     lisinopril 10 MG tablet  Commonly known as:  PRINIVIL  Take 1 tablet (10 mg total) by mouth daily.     magnesium oxide 400 MG tablet  Commonly known as:  MAG-OX  Take 400 mg by mouth 2 (two) times daily.     metFORMIN 500 MG tablet  Commonly known as:  GLUCOPHAGE  Take 1,000 mg by mouth 2 (two) times daily with a meal.     nitroGLYCERIN 0.4 MG SL tablet  Commonly known as:  NITROSTAT  Place 0.4 mg under the tongue every 5 (five) minutes x 3 doses as needed. For chest pain     sertraline 50 MG tablet   Commonly known as:  ZOLOFT  Take 50 mg by mouth daily.     simvastatin 80 MG tablet  Commonly known as:  ZOCOR  Take 40 mg by mouth at bedtime.     tiZANidine 4 MG tablet  Commonly known as:  ZANAFLEX  Take 4 mg by mouth 3 (three) times daily.        BRING ALL MEDICATIONS WITH YOU TO FOLLOW UP APPOINTMENTS  Time spent with patient to include physician time: Greater than 30 minutes, including physician time.  SignedPrescott Parma 02/10/2013, 10:50 AM Co-Sign MD  Leonia Reeves.D.

## 2013-02-10 NOTE — Progress Notes (Signed)
Patient ID: [name], male   DOB: [birth year], 57 y.o.   MRN: [id number] Subjective:  "I feel good"  Objective:  Vital Signs in the last 24 hours: Temp:  [97.3 F (36.3 C)-98.2 F (36.8 C)] 97.5 F (36.4 C) (05/10 0400) Pulse Rate:  [58-74] 64 (05/10 0700) Resp:  [0-19] 16 (05/10 0700) BP: (117-170)/(57-99) 158/64 mmHg (05/10 0700) SpO2:  [93 %-97 %] 96 % (05/10 0700)  Intake/Output from previous day: 05/09 0701 - 05/10 0700 In: 1080 [P.O.:680; I.V.:300; IV Piggyback:100] Out: 2050 [Urine:2050] Intake/Output from this shift:    Physical Exam: Well appearing middle aged man, NAD HEENT: Unremarkable Neck:  7 cm JVD, no thyromegally Back:  No CVA tenderness Lungs:  Clear with no wheezes HEART:  Regular rate rhythm, no murmurs, no rubs, no clicks Abd:  Flat, positive bowel sounds, no organomegally, no rebound, no guarding Ext:  2 plus pulses, no edema, no cyanosis, no clubbing Skin:  No rashes no nodules Neuro:  CN II through XII intact, motor grossly intact  Lab Results:  Recent Labs  02/09/13 0350 02/10/13 0350  WBC 6.8 9.2  HGB 12.8* 12.6*  PLT 147* 186    Recent Labs  02/09/13 0646 02/10/13 0350  NA 137 141  K 5.0 4.4  CL 107 108  CO2 21 26  GLUCOSE 116* 63*  BUN 30* 15  CREATININE 1.81* 1.00    Recent Labs  02/09/13 0830 02/09/13 1300  TROPONINI <0.30 <0.30   Hepatic Function Panel No results found for this basename: PROT, ALBUMIN, AST, ALT, ALKPHOS, BILITOT, BILIDIR, IBILI,  in the last 72 hours No results found for this basename: CHOL,  in the last 72 hours No results found for this basename: PROTIME,  in the last 72 hours  Imaging: Dg Chest 2 View  02/10/2013  *RADIOLOGY REPORT*  Clinical Data: Status post placement of implanted cardiac defibrillator.  CHEST - 2 VIEW  Comparison: 02/09/2013  Findings: Newly placed biventricular ICD device via the left subclavian vein shows appropriate positioning.  No evidence of abnormalities involving  right atrial, right ventricular or coronary sinus leads by chest x-ray.  No pneumothorax or edema is identified.  No evidence of pleural fluid.  Scarring and atelectasis present at the right lung base.  The heart size is stable.  IMPRESSION: No complications evident by chest x-ray after insertion of a biventricular ICD device.   Original Report Authenticated By: Irish Lack, M.D.    Dg Chest Portable 1 View  02/09/2013  *RADIOLOGY REPORT*  Clinical Data: 57 year old male chest pain and irregular heart rate.  PORTABLE CHEST - 1 VIEW  Comparison: 01/20/2012 and earlier.  Findings: Semi upright AP portable view 0016 hours.  Interval clearance of airspace disease at the right lung base.  Mild elevation of the right hemidiaphragm and linear opacity most resembling atelectasis persists.  Cardiac size at the upper limits of normal. Other mediastinal contours are within normal limits. Visualized tracheal air column is within normal limits.  No pneumothorax, pulmonary edema or pleural effusion.  IMPRESSION: Mild atelectasis at the right lung base, otherwise no acute cardiopulmonary abnormality.   Original Report Authenticated By: Erskine Speed, M.D.     Cardiac Studies: Tele - nsr with BiV Pacing Assessment/Plan:  1. CHB 2. ICM 3. Chronic systolic chf 4. S/p temporary PM 5. S/p BiV ICD Rec: doing well. Ok for discharge later this morning if device check ok. He is pacing appropriately. He will need usual followup in [place]. He will  need discharge on carvedilol 6.25 twice daily, losartan 50 mg daily. Additional medical therapy to be added as needed.  LOS: 1 day    Marco Adelson,M.D. 02/10/2013, 7:44 AM

## 2013-02-12 ENCOUNTER — Telehealth: Payer: Self-pay | Admitting: Internal Medicine

## 2013-02-12 NOTE — Telephone Encounter (Signed)
Returned call to patient he stated he was just discharged from hospital.Stated he had a ICD implanted.Stated he needs teeth pulled and was on IV antibiotics in hospital to prevent infection.Stated he was calling to find out if he needs to be taking antibiotics.Stated he was told when his wound healed he would need to have his teeth removed.Stated he has penicillin 500 mg at home that he takes when his teeth hurt. Message sent to Dr.Taylor for advice.

## 2013-02-12 NOTE — Telephone Encounter (Signed)
Called pt to set up wound ck, he asked about an abx, his teeth are rotten and the hospital gave him abx's while he was in there to help prevent infection but was not given an rx when he left, he wants to know if he needs an rx or is the abx given at the hospital enough? Uses [facility]

## 2013-02-13 NOTE — Telephone Encounter (Signed)
F/u   Pt stating did not receive a call about instructions as to what he needs to do about an antibiotic

## 2013-02-13 NOTE — Telephone Encounter (Signed)
Would use amox/clavulenic acid 500/125 twice daily.

## 2013-02-13 NOTE — Telephone Encounter (Signed)
Spoke with patient and let him know no need to start antibiotic until one week prior to getting his teeth extracted.  He will need to take for a total of 2 weeks, 1 week prior and 1 week after.  He needs to have his teeth extracted as quickly as possible to decrease the risk of infection.  He has a follow up appointment on 5/19 for wound check.  Will get him referred to Dr Cindra Eves [phone]

## 2013-02-14 NOTE — Telephone Encounter (Addendum)
Doris called me back from dental clinic.  She is going to get the patient an appointment for initial evaluation

## 2013-02-19 ENCOUNTER — Ambulatory Visit (INDEPENDENT_AMBULATORY_CARE_PROVIDER_SITE_OTHER): Payer: Medicare Other | Admitting: *Deleted

## 2013-02-19 ENCOUNTER — Encounter: Payer: Self-pay | Admitting: Internal Medicine

## 2013-02-19 DIAGNOSIS — I428 Other cardiomyopathies: Secondary | ICD-10-CM

## 2013-02-19 LAB — ICD DEVICE OBSERVATION
ATRIAL PACING ICD: 33 pct
DEVICE MODEL ICD: 950230
HV IMPEDENCE: 86 Ohm
LV LEAD IMPEDENCE ICD: 910 Ohm
RV LEAD IMPEDENCE ICD: 606 Ohm
TZON-0003SLOWVT: 333.3 ms

## 2013-02-19 NOTE — Progress Notes (Signed)
Wound check icd check in clinic. Normal device function. Battery longevity 8 years. ROV 05-18-13 @ 1115 with GT/RDS.

## 2013-03-07 ENCOUNTER — Encounter (HOSPITAL_COMMUNITY): Payer: Self-pay | Admitting: Dentistry

## 2013-03-07 ENCOUNTER — Ambulatory Visit (HOSPITAL_COMMUNITY): Payer: Medicare Other | Admitting: Dentistry

## 2013-03-07 VITALS — BP 162/87 | HR 70 | Temp 98.8°F | Wt 167.0 lb

## 2013-03-07 DIAGNOSIS — K045 Chronic apical periodontitis: Secondary | ICD-10-CM | POA: Insufficient documentation

## 2013-03-07 DIAGNOSIS — Z8679 Personal history of other diseases of the circulatory system: Secondary | ICD-10-CM

## 2013-03-07 DIAGNOSIS — K0530 Chronic periodontitis, unspecified: Secondary | ICD-10-CM | POA: Insufficient documentation

## 2013-03-07 DIAGNOSIS — K083 Retained dental root: Secondary | ICD-10-CM | POA: Insufficient documentation

## 2013-03-07 DIAGNOSIS — K029 Dental caries, unspecified: Secondary | ICD-10-CM | POA: Insufficient documentation

## 2013-03-07 DIAGNOSIS — I2510 Atherosclerotic heart disease of native coronary artery without angina pectoris: Secondary | ICD-10-CM

## 2013-03-07 NOTE — Progress Notes (Signed)
DENTAL CONSULTATION  Date of Consultation:  03/07/2013 Patient Name:   [name] Date of Birth:   Jul 21, 1956 Medical Record Number: [id number]  VITALS: BP 162/87  Pulse 70  Temp(Src) 98.8 F (37.1 C)  Wt 167 lb (75.751 kg)  BMI 22.64 kg/m2   CHIEF COMPLAINT: The patient was referred by Dr. Lewayne Bunting for a dental consultation to evaluate poor dentition.  HPI: [name] is a 57 year old male referred by Dr. Ladona Ridgel for a dental consultation. Patient with a history of complete heart block and is status post placement of a biventricular ICD on 02/09/2013. During that time, a poor dentition was noted, and the patient was referred to dental medicine for a dental consultation to rule out dental infection that may affect the patient's systemic health or the recent ICD placed on 02/09/2013.  Patient currently denies acute toothache, swellings, or abscesses. Patient has been having intermittent toothache symptoms over the past 7-8 years. Patient was previously evaluated by dental medicine in October of 2007. Patient was unable to undergo dental procedures at that time due to placement of drug-eluting stent. Patient was on Plavix therapy at that time they could not be discontinued per the Cardiologist. Patient has not seen any dentist since that time. Patient was initially planned to have all remaining teeth extracted and subsequent fabrication of upper and lower complete dentures by the dentist of his choice. Patient is now interested in the same treatment plan.   PMH: Past Medical History  Diagnosis Date  . COPD (chronic obstructive pulmonary disease)   . Arteriosclerotic cardiovascular disease (ASCVD)     Acute MI in 2006->  LAD stent, EF-37%  . Diabetes mellitus, type II     With peripheral neuropathy  . Colon cancer 2006    Partial colectomy in 2006  . Hypertension   . Hyperlipidemia   . Carpal tunnel syndrome   . Tobacco abuse   . Edema     venous insufficiency  . Anemia,  normocytic normochromic 2008    2008  . Pneumonia 01/2012    01/2012; subsequent admission for chest pain  . H/O alcohol dependence   . Complete heart block     S/P ICD 02/09/13    PSH: Past Surgical History  Procedure Laterality Date  . Subtotal colectomy  09/01/2005    subtotal colectomy  . Coronary angioplasty with stent placement    . Ankle surgery      Right  . Inguinal hernia repair    . Colonoscopy  08/17/2005    Pancolonic diverticula/Multiple rectal polyps removed as described above. The larger of the two likely responsible for intermittent hematochezia/ Multiple polyps at the hepatic flexure resected with a snare./ Large polypoid lesion growing out of the base of the cecum, just behind the  ileocecal valve. This was not felt to be amendable to endoscopic resection. Biopsied multiple times  . Flexible sigmoidoscopy    06/13/2006    Essentially normal residual rectum status post total colectomy. anastomosis at 25cm.  Focal area of abnormality adjacent to suture, as described above, likely not significant, suspect granulation tissue, biopsied.  Anastomosis otherwise appeared normal.  Distal terminal ileum appeared normal  . Biventricular icd placement  02/09/2013    Dr. Ladona Ridgel    ALLERGIES: Allergies  Allergen Reactions  . Vancomycin Itching and Rash    MEDICATIONS: Current Outpatient Prescriptions  Medication Sig Dispense Refill  . albuterol (PROVENTIL HFA;VENTOLIN HFA) 108 (90 BASE) MCG/ACT inhaler Inhale 2 puffs into the lungs  every 6 (six) hours as needed. For shortness of breath      . albuterol (PROVENTIL) (2.5 MG/3ML) 0.083% nebulizer solution Take 2.5 mg by nebulization every 6 (six) hours as needed. For shortness of breath      . aspirin 325 MG tablet Take 325 mg by mouth daily.      . carvedilol (COREG) 6.25 MG tablet Take 6.25 mg by mouth 2 (two) times daily with a meal.       . clopidogrel (PLAVIX) 75 MG tablet Take 75 mg by mouth daily.      . furosemide (LASIX)  40 MG tablet Take 40 mg by mouth daily.      . insulin glargine (LANTUS) 100 UNIT/ML injection Inject 20 Units into the skin 2 (two) times daily.       . magnesium oxide (MAG-OX) 400 MG tablet Take 400 mg by mouth 2 (two) times daily.      . metFORMIN (GLUCOPHAGE) 500 MG tablet Take 1,000 mg by mouth 2 (two) times daily with a meal.      . sertraline (ZOLOFT) 50 MG tablet Take 50 mg by mouth daily.      . simvastatin (ZOCOR) 80 MG tablet Take 40 mg by mouth at bedtime.      [name] tiZANidine (ZANAFLEX) 4 MG tablet Take 4 mg by mouth 3 (three) times daily.      [name] lisinopril (PRINIVIL) 10 MG tablet Take 1 tablet (10 mg total) by mouth daily.  30 tablet  12  . nitroGLYCERIN (NITROSTAT) 0.4 MG SL tablet Place 0.4 mg under the tongue every 5 (five) minutes x 3 doses as needed. For chest pain       No current facility-administered medications for this visit.    LABS: Lab Results  Component Value Date   WBC 9.2 02/10/2013   HGB 12.6* 02/10/2013   HCT 36.7* 02/10/2013   MCV 89.1 02/10/2013   PLT 186 02/10/2013      Component Value Date/Time   NA 141 02/10/2013 0350   K 4.4 02/10/2013 0350   CL 108 02/10/2013 0350   CO2 26 02/10/2013 0350   GLUCOSE 63* 02/10/2013 0350   BUN 15 02/10/2013 0350   CREATININE 1.00 02/10/2013 0350   CALCIUM 8.6 02/10/2013 0350   GFRNONAA 82* 02/10/2013 0350   GFRAA >90 02/10/2013 0350   Lab Results  Component Value Date   INR 1.01 02/09/2013   INR 0.98 03/26/2010   INR 0.96 03/26/2010   No results found for this basename: PTT    SOCIAL HISTORY: History   Social History  . Marital Status: Married    Spouse Name: N/A    Number of Children: 1  . Years of Education: N/A   Occupational History  . Parts Manager     Disabled   Social History Main Topics  . Smoking status: Current Every Day Smoker -- 1.00 packs/day for 40 years    Types: Cigarettes  . Smokeless tobacco: Never Used  . Alcohol Use: No     Comment: Quit 2005 from a 6 pack a day use  . Drug Use: No  .  Sexually Active: No   Other Topics Concern  . Not on file   Social History Narrative  . No narrative on file    FAMILY HISTORY: Family History  Problem Relation Age of Onset  . Colon cancer Mother     [name], ?>age 22.  [name] Heart disease Father   . Liver disease Neg  Hx   . Heart disease Mother      REVIEW OF SYSTEMS: Reviewed with patient and included in dental record.   DENTAL HISTORY: CHIEF COMPLAINT: The patient was referred by Dr. Lewayne Bunting for a dental consultation to evaluate poor dentition.  HPI: [name] is a 57 year old male referred by Dr. Ladona Ridgel for a dental consultation. Patient with a history of complete heart block and is status post placement of a biventricular ICD on 02/09/2013. During that time, a poor dentition was noted, and the patient was referred to dental medicine for a dental consultation to rule out dental infection that may affect the patient's systemic health or the recent ICD placed on 02/09/2013.  Patient currently denies acute toothache, swellings, or abscesses. Patient has been having intermittent toothache symptoms over the past 7-8 years. Patient was previously evaluated by dental medicine in October of 2007. Patient was unable to undergo dental procedures at that time due to placement of drug-eluting stent. Patient was on Plavix therapy at that time they could not be discontinued per the Cardiologist. Patient has not seen any dentist since that time. Patient was initially planned to have all remaining teeth extracted and subsequent fabrication of upper and lower complete dentures by the dentist of his choice. Patient is now interested in the same treatment plan.  DENTAL EXAMINATION:  GENERAL: Patient is a well-developed, well-nourished male in no acute distress. HEAD AND NECK: There is no palpable submandibular lymphadenopathy. The patient denies acute TMJ symptoms. Patient indicates that he does have a history of clicking on maximum  opening. INTRAORAL EXAM: The patient has normal saliva. There is no current evidence of abscess formation. DENTITION: The patient is missing tooth numbers 3, 14, 15, 16, 19, 24, 25, and 26. There are retained root segments in the area tooth numbers 1, 2, 5, 6, 7, 8, 9, 10, 11, 12, 13, 17, 18, 29, 30, 31, and 32. PERIODONTAL: The patient has chronic periodontitis with plaque and calculus accumulations, selective areas of gingival recession and tooth mobility is charted. DENTAL CARIES/SUBOPTIMAL RESTORATIONS: The patient has rampant dental caries affecting all remaining teeth as per dental charting form. ENDODONTIC: Patient currently denies acute pulpitis symptoms today. The patient has multiple areas of periapical pathology and radiolucency associated with tooth numbers 1, 2, 5, 6, 10, 11, 17, 18, 29, 30, 31, and 32. The patient has previous root canal therapy associated with retained root #7. This is now exposed to the oral environment. CROWN AND BRIDGE: There are no crown or bridge restorations PROSTHODONTIC: There are no partial dentures OCCLUSION: The patient has a poor occlusal scheme secondary to multiple missing teeth, multiple retained root segments, and lack of replacement of the missing teeth with dental restorations.  RADIOGRAPHIC INTERPRETATION: An orthopantogram was obtained today. There are multiple missing teeth. There are multiple retained root segments. There are rampant dental caries. There multiple areas of periapical pathology and radiolucency. There is incipient to severe bone loss noted. There is supra-eruption and drifting of the unopposed teeth into the edentulous areas. There is pneumatization of the maxillary sinuses.  ASSESSMENTS: 1. Chronic apical periodontitis 2. Chronic periodontitis 3. Gingival recession 4. Tooth mobility 5. Accretions 6. Rampant dental caries 7. Multiple missing teeth 8. Multiple retained root segments 9. No partial dentures 10. Poor occlusal  scheme and malocclusion 11. History of acute pulpitis  12. Plavix therapy with risk for bleeding with invasive dental procedures  PLAN/RECOMMENDATIONS: 1. I discussed the risks, benefits, and complications of various treatment options  with the patient in relationship to his medical and dental conditions. We discussed various treatment options to include no treatment, multiple extractions with alveoloplasty, pre-prosthetic surgery as indicated, periodontal therapy, dental restorations, root canal therapy, crown and bridge therapy, implant therapy, and replacement of missing teeth as indicated. The patient currently wishes to proceed with extraction of all remaining teeth with alveoloplasty and pre-prosthetic surgery as indicated in the operating room with general anesthesia. Patient will need to obtain a History and Physical examination and cardiac clearance from [facility] cardiology prior to the scheduling of the anticipated dental operating room procedure.  Once H and P is completed, OR will be scheduled allowing for the patient to discontinue Plavix therapy for 5-7 days prior to the anticipated dental operating room procedure. Patient will then followup with the primary dentist of his choice for fabrication of upper and lower complete dentures after adequate healing.   2. Discussion of findings with medical team and coordination of future medical and dental care as indicated.  Charlynne Pander, DDS

## 2013-03-07 NOTE — Patient Instructions (Signed)
Patient is to obtain a history and physical examination by [facility] cardiology in [facility] on 03/16/2013 at 1 PM. Once patient is cleared for surgery, the patient will be scheduled for her operating room procedure with general anesthesia.  Plavix will be discontinued for 5-7 days prior to anticipated invasive dental procedures. Patient will then followup with primary dentist of his choice for fabrication of upper and lower complete dentures after adequate healing.  Charlynne Pander, DDS

## 2013-03-15 NOTE — Progress Notes (Signed)
HPI: [name] is a 32 she will patient of Dr. Ladona Ridgel and Dr. Verdis Prime we are following for ongoing assessment and management after admission for complete heart block status post pacemaker insertion a [employer] CRT-D on 02/09/2013 (#[id number]),ischemic cardiomyopathy and longstanding chronic systolic CHF, EF of 20% to 25%. diffuse hypokinesis.Patient to resume his home medications of carvedilol 6.25 twice a day and lisinopril 10 mg daily, following discharge. Other history includes diabetes mellitus, COPD, hypertension, hyperlipidemia, and prior history of colon cancer.    He is going to be scheduled for full teeth extraction in the next couple of weeks. The patient wanted to touch base with [place] prior to doing so as he is on Plavix and has new PPM.  Dr.Robert Kristin Bruins is his dentist at [facility]. Dr. Lubertha Basque note states:    "Spoke with patient and let him know no need to start antibiotic until one week prior to getting his teeth extracted. He will need to take for a total of 2 weeks, 1 week prior and 1 week after. He needs to have his teeth extracted as quickly as possible to decrease the risk of infection."    The patient is requesting that we send a letter to Dr. Kristin Bruins to confirm.    Allergies  Allergen Reactions  . Vancomycin Itching and Rash    Current Outpatient Prescriptions  Medication Sig Dispense Refill  . albuterol (PROVENTIL HFA;VENTOLIN HFA) 108 (90 BASE) MCG/ACT inhaler Inhale 2 puffs into the lungs every 6 (six) hours as needed. For shortness of breath      . albuterol (PROVENTIL) (2.5 MG/3ML) 0.083% nebulizer solution Take 2.5 mg by nebulization every 6 (six) hours as needed. For shortness of breath      . aspirin 325 MG tablet Take 325 mg by mouth daily.      . carvedilol (COREG) 6.25 MG tablet Take 6.25 mg by mouth 2 (two) times daily with a meal.       . clopidogrel (PLAVIX) 75 MG tablet Take 75 mg by mouth daily.      . furosemide (LASIX) 40 MG tablet  Take 40 mg by mouth daily.      . hydrocodone-acetaminophen (LORCET-HD) 5-500 MG per capsule Take 1 capsule by mouth every 6 (six) hours as needed for pain.      [name] insulin glargine (LANTUS) 100 UNIT/ML injection Inject 20 Units into the skin 2 (two) times daily.       [name] lisinopril (PRINIVIL) 10 MG tablet Take 1 tablet (10 mg total) by mouth daily.  30 tablet  12  . magnesium oxide (MAG-OX) 400 MG tablet Take 400 mg by mouth 2 (two) times daily.      . metFORMIN (GLUCOPHAGE) 500 MG tablet Take 1,000 mg by mouth 2 (two) times daily with a meal.      . nitroGLYCERIN (NITROSTAT) 0.4 MG SL tablet Place 0.4 mg under the tongue every 5 (five) minutes x 3 doses as needed. For chest pain      . sertraline (ZOLOFT) 50 MG tablet Take 50 mg by mouth daily.      . simvastatin (ZOCOR) 80 MG tablet Take 40 mg by mouth at bedtime.      [name] tiZANidine (ZANAFLEX) 4 MG tablet Take 4 mg by mouth 3 (three) times daily.      . penicillin v potassium (VEETID) 500 MG tablet prn       No current facility-administered medications for this visit.    Past  Medical History  Diagnosis Date  . COPD (chronic obstructive pulmonary disease)   . Arteriosclerotic cardiovascular disease (ASCVD)     Acute MI in 2006->  LAD stent, EF-37%  . Diabetes mellitus, type II     With peripheral neuropathy  . Colon cancer 2006    Partial colectomy in 2006  . Hypertension   . Hyperlipidemia   . Carpal tunnel syndrome   . Tobacco abuse   . Edema     venous insufficiency  . Anemia, normocytic normochromic 2008    2008  . Pneumonia 01/2012    01/2012; subsequent admission for chest pain  . H/O alcohol dependence   . Complete heart block     S/P ICD 02/09/13    Past Surgical History  Procedure Laterality Date  . Subtotal colectomy  09/01/2005    subtotal colectomy  . Coronary angioplasty with stent placement    . Ankle surgery      Right  . Inguinal hernia repair    . Colonoscopy  08/17/2005    Pancolonic diverticula/Multiple  rectal polyps removed as described above. The larger of the two likely responsible for intermittent hematochezia/ Multiple polyps at the hepatic flexure resected with a snare./ Large polypoid lesion growing out of the base of the cecum, just behind the  ileocecal valve. This was not felt to be amendable to endoscopic resection. Biopsied multiple times  . Flexible sigmoidoscopy    06/13/2006    Essentially normal residual rectum status post total colectomy. anastomosis at 25cm.  Focal area of abnormality adjacent to suture, as described above, likely not significant, suspect granulation tissue, biopsied.  Anastomosis otherwise appeared normal.  Distal terminal ileum appeared normal  . Biventricular icd placement  02/09/2013    Dr. Ladona Ridgel    ROS: Review of systems complete and found to be negative unless listed above  PHYSICAL EXAM BP 110/70  Pulse 63  Ht 6' (1.829 m)  Wt 170 lb (77.111 kg)  BMI 23.05 kg/m2  General: Well developed, well nourished, in no acute distress Head: Eyes PERRLA, No xanthomas.   Normal cephalic and atramatic  Lungs: Clear bilaterally to auscultation and percussion. Heart: HRRR S1 S2, without MRG.  Pulses are 2+ & equal.            No carotid bruit. No JVD.  No abdominal bruits. No femoral bruits. Abdomen: Bowel sounds are positive, abdomen soft and non-tender without masses or                  Hernia's noted. Msk:  Back normal, normal gait. Normal strength and tone for age.Pacemker in upper left chest, well healed. No evidence of infection. Extremities: No clubbing, cyanosis or edema.  DP +1 Neuro: Alert and oriented X 3. Psych:  Flat affect, responds appropriately  EKG: Electronic ventricular pacemaker, rate of 63 bpm.  ASSESSMENT AND PLAN

## 2013-03-16 ENCOUNTER — Encounter: Payer: Self-pay | Admitting: *Deleted

## 2013-03-16 ENCOUNTER — Encounter: Payer: Self-pay | Admitting: Adult Health

## 2013-03-16 ENCOUNTER — Ambulatory Visit (INDEPENDENT_AMBULATORY_CARE_PROVIDER_SITE_OTHER): Payer: Medicare Other | Admitting: Adult Health

## 2013-03-16 VITALS — BP 110/70 | HR 63 | Ht 72.0 in | Wt 170.0 lb

## 2013-03-16 DIAGNOSIS — Z950 Presence of cardiac pacemaker: Secondary | ICD-10-CM

## 2013-03-16 DIAGNOSIS — Z95810 Presence of automatic (implantable) cardiac defibrillator: Secondary | ICD-10-CM | POA: Insufficient documentation

## 2013-03-16 DIAGNOSIS — I10 Essential (primary) hypertension: Secondary | ICD-10-CM

## 2013-03-16 MED ORDER — AMOXICILLIN 500 MG PO TABS: 500.0000 mg | ORAL_TABLET | Freq: Two times a day (BID) | ORAL | Status: DC

## 2013-03-16 NOTE — Patient Instructions (Addendum)
Your physician recommends that you schedule a follow-up appointment in: 1 MONTH  Your physician has recommended you make the following change in your medication: Amoxicillin 500 Twice daily, one week prior to dental procedure and one week after procedure.   Can stop plavix for 5 days prior to procedure.

## 2013-03-16 NOTE — Assessment & Plan Note (Signed)
Patient is cleared from a cardiac standpoint as low risk for cardiac event. Endocarditis prophylaxis will be given with amoxicillin 500 mg BID for one week prior to and one week after dental extraction. He is also to stop Plavix for 5 days prior to procedure, starting back ASAP.

## 2013-03-16 NOTE — Assessment & Plan Note (Signed)
Bacterial endocarditis prophylaxis is prescribed.Letter sent to Dr. Kristin Bruins dentist.

## 2013-03-16 NOTE — Assessment & Plan Note (Signed)
He is without cardiac complaints. He will continue on risk stratification, continue Plavix until 5 days prior to dental extraction. Will see him in one month for ongoing assessment

## 2013-04-03 ENCOUNTER — Other Ambulatory Visit (HOSPITAL_COMMUNITY): Payer: Self-pay | Admitting: Dentistry

## 2013-04-03 ENCOUNTER — Encounter (HOSPITAL_COMMUNITY): Payer: Self-pay | Admitting: Pharmacy Technician

## 2013-04-05 ENCOUNTER — Other Ambulatory Visit: Payer: Self-pay | Admitting: *Deleted

## 2013-04-05 ENCOUNTER — Encounter (HOSPITAL_COMMUNITY): Payer: Self-pay

## 2013-04-05 ENCOUNTER — Encounter (HOSPITAL_COMMUNITY)
Admission: RE | Admit: 2013-04-05 | Discharge: 2013-04-05 | Disposition: A | Discharge status: Home / Self Care | Payer: Medicare Other | Source: Ambulatory Visit | Attending: Dentistry | Admitting: Dentistry

## 2013-04-05 HISTORY — DX: Type 2 diabetes mellitus with diabetic neuropathy, unspecified: E11.40

## 2013-04-05 HISTORY — DX: Acute myocardial infarction, unspecified: I21.9

## 2013-04-05 HISTORY — DX: Personal history of other diseases of the digestive system: Z87.19

## 2013-04-05 LAB — CBC
HCT: 32.6 % — ABNORMAL LOW (ref 39.0–52.0)
Hemoglobin: 11 g/dL — ABNORMAL LOW (ref 13.0–17.0)
MCH: 29.7 pg (ref 26.0–34.0)
MCV: 88.1 fL (ref 78.0–100.0)
RBC: 3.7 MIL/uL — ABNORMAL LOW (ref 4.22–5.81)

## 2013-04-05 LAB — BASIC METABOLIC PANEL
CO2: 25 mEq/L (ref 19–32)
Chloride: 102 mEq/L (ref 96–112)
Glucose, Bld: 205 mg/dL — ABNORMAL HIGH (ref 70–99)
Potassium: 5.3 mEq/L — ABNORMAL HIGH (ref 3.5–5.1)
Sodium: 136 mEq/L (ref 135–145)

## 2013-04-05 NOTE — Progress Notes (Addendum)
Pt denies currently experiencing chest pain and SOB. Pt states that he is under the care of Dr. Ladona Ridgel at  [facility] for cardiology and care of his pacemaker; a peri-op prescription was faxed to [facility]. Pt states that he also had a cardiac cath done there.   Pt states that he had a stress test and echo done at [facility] 2 years ago. A request was made for results of studies in addition to latest office notes. Pt states that he was told by doctor Ladona Ridgel and the surgeon to only stop taking his Plavix and he was not told to stop taking his Aspirin. Spoke with Dr. Ileene Hutchinson and he confirmed that pt is to continue with 325 mg Aspirin daily. Pt advised to Stop taking herbal medications. Do not take any NSAIDs ie: Ibuprofen, Advil, Naproxen or any medication containing Aspirin. Pt was also advised to bring in rescue inhaler (Albuterol ) on DOS. Revonda Standard, [place] was consulted ( anesthesia).

## 2013-04-05 NOTE — Telephone Encounter (Signed)
Patient has not been seen since 12/29/2010. Has had multiple warnings. Rx request denied, faxed back to pharmacy.

## 2013-04-05 NOTE — Pre-Procedure Instructions (Signed)
[name]  04/05/2013   Your procedure is scheduled on: Monday, April 09, 2013  Report to [facility] ( take [address] elevators to 3rd floor) at 5:30 AM.  Call this number if you have problems the morning of surgery: [phone]   Remember:   Do not eat food or drink liquids after midnight.   Take these medicines the morning of surgery with A SIP OF WATER: carvedilol (COREG) 6.25 MG tablet, sertraline (ZOLOFT) 50 MG tablet, tiZANidine (ZANAFLEX) 4 MG tablet if needed: nitroGLYCERIN (NITROSTAT) 0.4 MG SL tablet for chest pain,  hydrocodone-acetaminophen (LORCET-HD) 5-500 MG per capsule ,  Do not wear jewelry, make-up or nail polish.  Do not wear lotions, powders, or perfumes. You may wear deodorant.  Do not shave 48 hours prior to surgery. Men may shave face and neck.  Do not bring valuables to the hospital.  [facility] is not responsible  for any belongings or valuables.  Contacts, dentures or bridgework may not be worn into surgery.  Leave suitcase in the car. After surgery it may be brought to your room.  For patients admitted to the hospital, checkout time is 11:00 AM the day of discharge.   Patients discharged the day of surgery will not be allowed to drive home.  Name and phone number of your driver:   Special Instructions: Shower using CHG 2 nights before surgery and the night before surgery.  If you shower the day of surgery use CHG.  Use special wash - you have one bottle of CHG for all showers.  You should use approximately 1/3 of the bottle for each shower.   Please read over the following fact sheets that you were given: Pain Booklet, Coughing and Deep Breathing and Surgical Site Infection Prevention

## 2013-04-05 NOTE — Progress Notes (Signed)
Anesthesia Chart Review:  Patient is a 57 year old male scheduled for multiple teeth extractions on 04/09/13 by Dr. Kristin Bruins.  History includes CAD/MI s/p BMS to mid LAD '06 with DES mid LAD for in-stent restenosis '07, admission with complete heart block with underlying ischemia cardiomyopathy s/p insertion of a [employer] biventricular ICD on 02/09/2013 [phone]), chronic CHF, HTN, COPD, DM2, hiatal hernia, anemia, colon cancer s/p partial colectomy '06, arthritis, PNA '13, CKD with acute renal failure with severe hyperkalemia '11.    Cardiologist is Dr. Lewayne Bunting (EP, [facility]).  He was also seen by Dr. Verdis Prime [facility]) during his May 2014 hospitalization, but has never been seen at his office.  (He had previously seen Dr. Alanda Amass at [facility] in 2011.) Dr. Ladona Ridgel is aware of planned procedure and felt teeth should be extracted as quickly as possible to decrease risk of infection.  He was also seen by [facility] NP Joni Reining on 03/16/13 for a preoperative evaluation, and patient was felt "low risk from a cardiac standpoint."  He was started on Amoxicillin 500 mg BID for one week prior and is to continue this for one week post-procedure.  Echo on 02/09/13 (in the setting of presentation with CHF and placement of temporary pacing wire) showed: - Left ventricle: The cavity size was moderately to severely dilated. Wall thickness was normal. Systolic function was severely reduced. The estimated ejection fraction was in the range of 20% to 25%. Diffuse hypokinesis. Findings consistent with left ventricular diastolic dysfunction. Doppler parameters are consistent with high ventricular filling pressure. - Mitral valve: Mild regurgitation. - Left atrium: The atrium was mildly dilated. - Impressions: EF is somewhat worse than the prior study. With contrast injection, I do not see any definite LV apical clot. There is nothing in history about a pacemaker. But I question a  wire in the IVC and RA, but not in the RV. Is it in the RVOT?  Reportedly, he had a stress test at [facility] in ~ 2011.  Records requested, but are still pending.  Cardiac cath on 11/30/05 showed 90% in-stent restenosis mid LAD s/p DES, normal CX and RCA, EF 45% with apical dyskinesis and anterolateral hypokinesis.  EKG on 03/16/13 showed v-paced rhythm.  CXR report on 02/10/13 showed: Newly placed biventricular ICD device via the left subclavian vein shows appropriate positioning. No evidence of abnormalities involving right atrial, right ventricular or coronary sinus leads by chest x-ray. No pneumothorax or edema is identified. No evidence of pleural fluid. Scarring and atelectasis present at the right lung base. The heart size is stable.  Preoperative labs noted.  He will get a CBG on arrival.  Patient was to see me prior to leaving his PAT visit, but left.  He has cardiac clearance.  Patient told his PAT RN that he was already told to hold Plavix, but was still taking ASA.  His PAT RN was going to alert the surgeon's office.  If no acute changes then I would anticipate that he could proceed as planned.  Velna Ochs [facility] Short Stay Center/Anesthesiology Phone [phone] 04/05/2013 4:05 PM

## 2013-04-08 MED ORDER — CEFAZOLIN SODIUM-DEXTROSE 2-3 GM-% IV SOLR
2.0000 g | Freq: Once | INTRAVENOUS | Status: AC
  Administered 2013-04-09: 2 g via INTRAVENOUS
  Filled 2013-04-08: qty 50

## 2013-04-09 ENCOUNTER — Ambulatory Visit (HOSPITAL_COMMUNITY): Payer: Medicare Other | Admitting: Anesthesiology

## 2013-04-09 ENCOUNTER — Encounter (HOSPITAL_COMMUNITY): Payer: Self-pay | Admitting: Anesthesiology

## 2013-04-09 ENCOUNTER — Encounter (HOSPITAL_COMMUNITY)
Admission: RE | Disposition: A | Discharge status: Home / Self Care | Payer: Self-pay | Source: Ambulatory Visit | Attending: Dentistry

## 2013-04-09 ENCOUNTER — Ambulatory Visit (HOSPITAL_COMMUNITY)
Admission: RE | Admit: 2013-04-09 | Discharge: 2013-04-09 | Disposition: A | Discharge status: Home / Self Care | Payer: Medicare Other | Source: Ambulatory Visit | Attending: Dentistry | Admitting: Dentistry

## 2013-04-09 ENCOUNTER — Encounter (HOSPITAL_COMMUNITY): Payer: Self-pay | Admitting: Vascular Surgery

## 2013-04-09 DIAGNOSIS — K045 Chronic apical periodontitis: Secondary | ICD-10-CM | POA: Insufficient documentation

## 2013-04-09 DIAGNOSIS — I2510 Atherosclerotic heart disease of native coronary artery without angina pectoris: Secondary | ICD-10-CM | POA: Insufficient documentation

## 2013-04-09 DIAGNOSIS — K029 Dental caries, unspecified: Secondary | ICD-10-CM

## 2013-04-09 DIAGNOSIS — Z9861 Coronary angioplasty status: Secondary | ICD-10-CM | POA: Insufficient documentation

## 2013-04-09 DIAGNOSIS — I459 Conduction disorder, unspecified: Secondary | ICD-10-CM | POA: Insufficient documentation

## 2013-04-09 DIAGNOSIS — K083 Retained dental root: Secondary | ICD-10-CM

## 2013-04-09 DIAGNOSIS — K0530 Chronic periodontitis, unspecified: Secondary | ICD-10-CM

## 2013-04-09 DIAGNOSIS — Z95810 Presence of automatic (implantable) cardiac defibrillator: Secondary | ICD-10-CM | POA: Insufficient documentation

## 2013-04-09 DIAGNOSIS — Z950 Presence of cardiac pacemaker: Secondary | ICD-10-CM

## 2013-04-09 HISTORY — PX: MULTIPLE EXTRACTIONS WITH ALVEOLOPLASTY: SHX5342

## 2013-04-09 LAB — GLUCOSE, CAPILLARY: Glucose-Capillary: 152 mg/dL — ABNORMAL HIGH (ref 70–99)

## 2013-04-09 SURGERY — MULTIPLE EXTRACTION WITH ALVEOLOPLASTY
Anesthesia: General | Site: Mouth | Wound class: Clean Contaminated

## 2013-04-09 MED ORDER — HEMOSTATIC AGENTS (NO CHARGE) OPTIME
TOPICAL | Status: DC | PRN
  Administered 2013-04-09: 1 via TOPICAL

## 2013-04-09 MED ORDER — SODIUM CHLORIDE 0.9 % IR SOLN
Status: DC | PRN
  Administered 2013-04-09: 1

## 2013-04-09 MED ORDER — STERILE WATER FOR IRRIGATION IR SOLN
Status: DC | PRN
  Administered 2013-04-09: 1

## 2013-04-09 MED ORDER — LIDOCAINE-EPINEPHRINE 2 %-1:100000 IJ SOLN
INTRAMUSCULAR | Status: DC | PRN
  Administered 2013-04-09: 10.2 mL

## 2013-04-09 MED ORDER — DEXAMETHASONE SODIUM PHOSPHATE 4 MG/ML IJ SOLN
INTRAMUSCULAR | Status: DC | PRN
  Administered 2013-04-09: 4 mg via INTRAVENOUS

## 2013-04-09 MED ORDER — BUPIVACAINE-EPINEPHRINE 0.5% -1:200000 IJ SOLN
INTRAMUSCULAR | Status: DC | PRN
  Administered 2013-04-09: 3.6 mL

## 2013-04-09 MED ORDER — CARVEDILOL 6.25 MG PO TABS
6.2500 mg | ORAL_TABLET | Freq: Once | ORAL | Status: AC
  Filled 2013-04-09: qty 1

## 2013-04-09 MED ORDER — AMINOCAPROIC ACID SOLUTION 5% (50 MG/ML)
10.0000 mL | ORAL | Status: DC
  Administered 2013-04-09: 10 mL via ORAL
  Filled 2013-04-09: qty 100

## 2013-04-09 MED ORDER — FENTANYL CITRATE 0.05 MG/ML IJ SOLN
INTRAMUSCULAR | Status: DC | PRN
  Administered 2013-04-09: 100 ug via INTRAVENOUS
  Administered 2013-04-09: 50 ug via INTRAVENOUS

## 2013-04-09 MED ORDER — ARTIFICIAL TEARS OP OINT
TOPICAL_OINTMENT | OPHTHALMIC | Status: DC | PRN
  Administered 2013-04-09: 1 via OPHTHALMIC

## 2013-04-09 MED ORDER — LIDOCAINE HCL (CARDIAC) 20 MG/ML IV SOLN
INTRAVENOUS | Status: DC | PRN
  Administered 2013-04-09: 40 mg via INTRAVENOUS

## 2013-04-09 MED ORDER — GLYCOPYRROLATE 0.2 MG/ML IJ SOLN
INTRAMUSCULAR | Status: DC | PRN
  Administered 2013-04-09: 0.3 mg via INTRAVENOUS

## 2013-04-09 MED ORDER — OXYMETAZOLINE HCL 0.05 % NA SOLN
NASAL | Status: DC | PRN
  Administered 2013-04-09: 1 via NASAL

## 2013-04-09 MED ORDER — LACTATED RINGERS IV SOLN
INTRAVENOUS | Status: DC | PRN
  Administered 2013-04-09: 07:00:00 via INTRAVENOUS

## 2013-04-09 MED ORDER — ROCURONIUM BROMIDE 100 MG/10ML IV SOLN
INTRAVENOUS | Status: DC | PRN
  Administered 2013-04-09: 50 mg via INTRAVENOUS

## 2013-04-09 MED ORDER — BUPIVACAINE-EPINEPHRINE PF 0.5-1:200000 % IJ SOLN
INTRAMUSCULAR | Status: AC
  Filled 2013-04-09: qty 3.6

## 2013-04-09 MED ORDER — PROPOFOL 10 MG/ML IV BOLUS
INTRAVENOUS | Status: DC | PRN
  Administered 2013-04-09: 150 mg via INTRAVENOUS

## 2013-04-09 MED ORDER — NEOSTIGMINE METHYLSULFATE 1 MG/ML IJ SOLN
INTRAMUSCULAR | Status: DC | PRN
  Administered 2013-04-09: 2 mg via INTRAVENOUS

## 2013-04-09 MED ORDER — ONDANSETRON HCL 4 MG/2ML IJ SOLN
INTRAMUSCULAR | Status: DC | PRN
  Administered 2013-04-09: 4 mg via INTRAVENOUS

## 2013-04-09 MED ORDER — MIDAZOLAM HCL 5 MG/5ML IJ SOLN
INTRAMUSCULAR | Status: DC | PRN
  Administered 2013-04-09: 2 mg via INTRAVENOUS

## 2013-04-09 MED ORDER — OXYCODONE-ACETAMINOPHEN 5-325 MG PO TABS: ORAL_TABLET | ORAL | Status: DC

## 2013-04-09 MED ORDER — FENTANYL CITRATE 0.05 MG/ML IJ SOLN: 25.0000 ug | INTRAMUSCULAR | Status: DC | PRN

## 2013-04-09 MED ORDER — CARVEDILOL 3.125 MG PO TABS
ORAL_TABLET | ORAL | Status: AC
  Administered 2013-04-09: 6.25 mg via ORAL
  Filled 2013-04-09: qty 2

## 2013-04-09 MED ORDER — EPHEDRINE SULFATE 50 MG/ML IJ SOLN
INTRAMUSCULAR | Status: DC | PRN
  Administered 2013-04-09: 10 mg via INTRAVENOUS

## 2013-04-09 MED ORDER — OXYMETAZOLINE HCL 0.05 % NA SOLN
NASAL | Status: AC
  Filled 2013-04-09: qty 15

## 2013-04-09 MED ORDER — ACETAMINOPHEN 10 MG/ML IV SOLN: 1000.0000 mg | Freq: Once | INTRAVENOUS | Status: DC | PRN

## 2013-04-09 MED ORDER — METOCLOPRAMIDE HCL 5 MG/ML IJ SOLN: 10.0000 mg | Freq: Once | INTRAMUSCULAR | Status: DC | PRN

## 2013-04-09 MED ORDER — LIDOCAINE-EPINEPHRINE 2 %-1:100000 IJ SOLN
INTRAMUSCULAR | Status: AC
  Filled 2013-04-09: qty 10.2

## 2013-04-09 SURGICAL SUPPLY — 38 items
ALCOHOL 70% 16 OZ (MISCELLANEOUS) ×2 IMPLANT
ATTRACTOMAT 16X20 MAGNETIC DRP (DRAPES) ×2 IMPLANT
BLADE SURG 15 STRL LF DISP TIS (BLADE) ×2 IMPLANT
BLADE SURG 15 STRL SS (BLADE) ×4
CLOTH BEACON ORANGE TIMEOUT ST (SAFETY) ×2 IMPLANT
COVER SURGICAL LIGHT HANDLE (MISCELLANEOUS) ×2 IMPLANT
CRADLE DONUT ADULT HEAD (MISCELLANEOUS) ×1 IMPLANT
GAUZE PACKING FOLDED 2  STR (GAUZE/BANDAGES/DRESSINGS) ×1
GAUZE PACKING FOLDED 2 STR (GAUZE/BANDAGES/DRESSINGS) ×1 IMPLANT
GAUZE SPONGE 4X4 16PLY XRAY LF (GAUZE/BANDAGES/DRESSINGS) ×3 IMPLANT
GLOVE SURG ORTHO 8.0 STRL STRW (GLOVE) ×2 IMPLANT
GLOVE SURG SS PI 6.5 STRL IVOR (GLOVE) ×2 IMPLANT
GOWN STRL REIN 3XL LVL4 (GOWN DISPOSABLE) ×2 IMPLANT
HEMOSTAT SURGICEL .5X2 ABSORB (HEMOSTASIS) IMPLANT
HEMOSTAT SURGICEL 2X14 (HEMOSTASIS) ×1 IMPLANT
KIT BASIN OR (CUSTOM PROCEDURE TRAY) ×2 IMPLANT
KIT ROOM TURNOVER OR (KITS) ×2 IMPLANT
MANIFOLD NEPTUNE WASTE (CANNULA) ×2 IMPLANT
NDL BLUNT 16X1.5 OR ONLY (NEEDLE) ×1 IMPLANT
NDL DENTAL 27 LONG (NEEDLE) IMPLANT
NEEDLE BLUNT 16X1.5 OR ONLY (NEEDLE) ×2 IMPLANT
NEEDLE DENTAL 27 LONG (NEEDLE) IMPLANT
NS IRRIG 1000ML POUR BTL (IV SOLUTION) ×2 IMPLANT
PACK EENT II TURBAN DRAPE (CUSTOM PROCEDURE TRAY) ×2 IMPLANT
PAD ARMBOARD 7.5X6 YLW CONV (MISCELLANEOUS) ×4 IMPLANT
SPONGE GAUZE 4X4 12PLY (GAUZE/BANDAGES/DRESSINGS) ×1 IMPLANT
SPONGE SURGIFOAM ABS GEL 100 (HEMOSTASIS) IMPLANT
SPONGE SURGIFOAM ABS GEL 12-7 (HEMOSTASIS) IMPLANT
SPONGE SURGIFOAM ABS GEL SZ50 (HEMOSTASIS) IMPLANT
SUCTION FRAZIER TIP 10 FR DISP (SUCTIONS) ×2 IMPLANT
SUT CHROMIC 3 0 PS 2 (SUTURE) ×6 IMPLANT
SUT CHROMIC 4 0 P 3 18 (SUTURE) IMPLANT
SYR 50ML SLIP (SYRINGE) ×2 IMPLANT
TOWEL OR 17X24 6PK STRL BLUE (TOWEL DISPOSABLE) ×2 IMPLANT
TOWEL OR 17X26 10 PK STRL BLUE (TOWEL DISPOSABLE) ×2 IMPLANT
TUBE CONNECTING 12X1/4 (SUCTIONS) ×2 IMPLANT
WATER STERILE IRR 1000ML POUR (IV SOLUTION) ×2 IMPLANT
YANKAUER SUCT BULB TIP NO VENT (SUCTIONS) ×2 IMPLANT

## 2013-04-09 NOTE — Progress Notes (Signed)
04/09/2013   PRE-OPERATIVE NOTE:  04/09/2013 [name] [id number]  VITALS: BP 144/84  Pulse 78  Temp(Src) 98.4 F (36.9 C)  Resp 18  SpO2 97%  Lab Results  Component Value Date   WBC 8.7 04/05/2013   HGB 11.0* 04/05/2013   HCT 32.6* 04/05/2013   MCV 88.1 04/05/2013   PLT 172 04/05/2013   BMET    Component Value Date/Time   NA 136 04/05/2013 1414   K 5.3* 04/05/2013 1414   CL 102 04/05/2013 1414   CO2 25 04/05/2013 1414   GLUCOSE 205* 04/05/2013 1414   BUN 27* 04/05/2013 1414   CREATININE 1.13 04/05/2013 1414   CALCIUM 9.1 04/05/2013 1414   GFRNONAA 71* 04/05/2013 1414   GFRAA 82* 04/05/2013 1414    Lab Results  Component Value Date   INR 1.01 02/09/2013   INR 0.98 03/26/2010   INR 0.96 03/26/2010   No results found for this basename: PTT     Patient presents for dental procedures in the operating room.  Again discuss risks, benefits, competitions of the treatment to be performed today. We discussed pacifically the risk for bleeding with invasive dental procedures. We also discussed the potential for cardiovascular complications secondary to being off of Plavix to allow for the dental extractions. Patient expresses understanding and agrees to proceed with treatment as planned.   SUBJECTIVE: The patient denies any acute medical or dental changes  other than an occasional cough and agrees to proceed with treatment as planned.  EXAM: No sign of acute dental changes.  ASSESSMENT: Patient is affected by chronic periodontitis, accretions, multiple retained root segments, chronic apical periodontitis, and extensive dental caries.  PLAN: Patient agrees to proceed with treatment as planned in the operating room as previously discussed and accepts the risks, benefits, complications of the proposed treatment.  Charlynne Pander, DDS

## 2013-04-09 NOTE — Preoperative (Signed)
Beta Blockers   Reason not to administer Beta Blockers:Not Applicable, last dose 04/09/13 at 06:59

## 2013-04-09 NOTE — Progress Notes (Signed)
Dr. Noreene Larsson called and notified of pt's ICD.  Info regarding ICD instructions faxed to cardiologist, but did not receive return fax, anesthesia made aware.

## 2013-04-09 NOTE — Op Note (Signed)
Patient:            [name] Date of Birth:  Sep 22, 1956 MRN:                [id number]   DATE OF PROCEDURE:  04/09/2013               OPERATIVE REPORT   PREOPERATIVE DIAGNOSES: 1. History of heart block status post ICD placement 2. Coronary artery disease status post stent 3. Chronic apical periodontitis 4. Multiple retained root segments 5. Multiple dental caries 6. Chronic periodontitis  POSTOPERATIVE DIAGNOSES: 1. History of heart block status post ICD placement 2. Coronary artery disease status post stent 3. Chronic apical periodontitis 4. Multiple retained root segments 5. Multiple dental caries 6. Chronic periodontitis  OPERATIONS: 1. Multiple extraction of tooth numbers 1, 2, 4, 5, 6, 7, 8, 9, 10, 11, 12, 13, 17, 18, 20, 21, 22, 23, 27, 28, 29, 30, 31, and 32. 2. Four Quadrants of alveoloplasty   SURGEON: Charlynne Pander, DDS  ASSISTANT: Zettie Pho, (dental assistant)  ANESTHESIA: General anesthesia via oral endotracheal tube.  MEDICATIONS: 1. Ancef 2 g IV prior to invasive dental procedures. 2. Local anesthesia with a total utilization of five carpules each containing 34 mg of lidocaine with 0.017 mg of epinephrine as well as 2 carpules each containing 9 mg of bupivacaine with 0.009 mg of epinephrine. 3. Amicar 5% oral rinse. Patient is to rinse with 10 ML every hour for the next 10 hours to aid hemostasis. Patient is to rinse and spit out excess. Patient is not to swallow.  SPECIMENS: There are 24 teeth that were discarded.  DRAINS: None  CULTURES: None  COMPLICATIONS: None   ESTIMATED BLOOD LOSS: 100 mLs.  INTRAVENOUS FLUIDS: 700 mLs of Lactated ringers solution.  INDICATIONS: The patient was recently referred for evaluation of poor dentition.  A dental consultation was then requested to rule out dental infection that may affect the patient's systemic health and previous stent and pacemaker.  The patient was examined and treatment  planned for extraction remaining teeth with alveoloplasty as indicated.  OPERATIVE FINDINGS: Patient was examined operating room number 8.  The teeth were identified for extraction. The patient was noted be affected by chronic periodontitis, chronic apical periodontitis, multiple retained root segments, and rampant dental caries.   DESCRIPTION OF PROCEDURE: Patient was brought to the main operating room number 8. Patient was then placed in the supine position on the operating table. General anesthesia was then induced per the anesthesia team. The patient was then prepped and draped in the usual manner for dental medicine procedure. A timeout was performed. The patient was identified and procedures were verified. A throat pack was placed at this time. The oral cavity was then thoroughly examined with the findings noted above. The patient was then ready for dental medicine procedure as follows:  Local anesthesia was then administered sequentially with a total utilization of 5 carpules each containing 34 mg of lidocaine with 0.017 mg of epinephrine as well as 2 carpules  each containing 9 mg bupivacaine with 0.009 mg of epinephrine.  The Maxillary left and right quadrants first approached. Anesthesia was then delivered utilizing infiltration with lidocaine with epinephrine. A #15 blade incision was then made from the maxillary right tuberosity and extended to the distal of #15.  A  surgical flap was then carefully reflected. Appropriate amounts of buccal and interseptal bone were then removed utilizing a surgical handpiece and bur and copious amounts of  sterile saline.  The teeth were then subluxated with a series of straight elevators. Tooth numbers 1, 2, 4, 5, 6, 7, 8, 9, 10, 11, 12, 13 were then removed with a 150 forceps without complications. Alveoloplasty was then performed utilizing a ronguers and bone file. The surgical site was then irrigated with copious amounts of sterile saline. The tissues were  approximated and trimmed appropriately.a piece of Surgicel was then placed in each extraction socket appropriately. The maxillary right surgical site was then closed from the maxillary tuberosity and extended the mesial #8 utilizing 3-0 chromic gut suture in a continuous interrupted suture technique x1. One individual interrupted sutures then placed to further closed surgical site. The maxillary left surgical site was then closed from the distal of #15 extended the mesial #9 utilizing 3-0 chromic gut suture in a continuous interrupted suture technique x1.  At this point time, the mandibular quadrants were approached. The patient was given bilateral inferior alveolar nerve blocks and long buccal nerve blocks utilizing the bupivacaine with epinephrine. Further infiltration was then achieved utilizing the lidocaine with epinephrine. A 15 blade incision was then made from the distal of number #17 and extended to the mesial of #24.  A second 15 blade incision was made from the distal of #32 and extended to the mesial numbers 26.  A surgical flap was then carefully reflected. Appropriate amounts of buccal and interseptal bone were then removed appropriately. Tooth numbers 17, 18, 20, 21, 22, 23, 27, 28, 29, 30, 31, and 32 were then removed utilizing a 151 forceps without complications. Alveoloplasty was then performed utilizing a rongeurs and bone file. The tissues were approximated and trimmed appropriately. The surgical sites were then irrigated with copious amounts of sterile saline. A piece of Surgicel was then placed in the extraction socket appropriately. The mandibular rightsurgical site was then closed from the distal of 32 and extended to the mesial of #6 utilizing 3-0 chromic gut suture in a continuous interrupted suture technique x1.  The mandibular left surgical site was then closed from the distal of #17 and extended to the mesial of #24 utilizing 3-0 chromic gut suture in a continuous interrupted suture  technique x1. One individual interrupted suture was then placed to further closed surgical site.  At this point time, the entire mouth was irrigated with copious amounts of sterile saline. The patient was examined for complications, seeing none, the dental medicine procedure was deemed to be complete. The throat pack was removed at this time. A series of 4 x 4 gauze that had Amicar 5% rinse placed on them were then placed in the mouth to aid hemostasis. The patient was then handed over to the anesthesia team for final disposition. After an appropriate amount of time, the patient was extubated and taken to the postanesthsia care unit with stable vital signs and a good condition. All counts were correct for the dental medicine procedure.  The patient will continue Amicar 5% rinses.  He is to rinse with 10 mls every hour for the next 10 hours. Patient is to spit out excess do not swallow. Patient is to restart Plavix therapy as per instructions in the postop instruction area. The patient is to call if persistent problems with bleeding. No further oral antibiotic therapy is needed at this time.   Charlynne Pander, DDS.

## 2013-04-09 NOTE — Anesthesia Preprocedure Evaluation (Addendum)
Anesthesia Evaluation  Patient identified by MRN, date of birth, ID band Patient awake    Reviewed: Allergy & Precautions, H&P , NPO status , Patient's Chart, lab work & pertinent test results, reviewed documented beta blocker date and time   Airway Mallampati: II TM Distance: >3 FB Neck ROM: full    Dental  (+) Poor Dentition and Dental Advisory Given   Pulmonary shortness of breath, pneumonia -, resolved, COPDCurrent Smoker,  breath sounds clear to auscultation        Cardiovascular hypertension, On Medications and On Home Beta Blockers + CAD, + Past MI and + Cardiac Stents negative cardio ROS  + dysrhythmias + pacemaker + Cardiac Defibrillator + Valvular Problems/Murmurs MR Rhythm:regular     Neuro/Psych PSYCHIATRIC DISORDERS  Neuromuscular disease negative neurological ROS     GI/Hepatic Neg liver ROS, hiatal hernia,   Endo/Other  diabetes, Type 2, Oral Hypoglycemic Agents and Insulin Dependent  Renal/GU Renal disease  negative genitourinary   Musculoskeletal   Abdominal   Peds  Hematology  (+) anemia ,   Anesthesia Other Findings See surgeon's H&P   Reproductive/Obstetrics negative OB ROS                          Anesthesia Physical Anesthesia Plan  ASA: IV  Anesthesia Plan: General   Post-op Pain Management:    Induction: Intravenous  Airway Management Planned: Oral ETT  Additional Equipment:   Intra-op Plan:   Post-operative Plan: Extubation in OR  Informed Consent: I have reviewed the patients History and Physical, chart, labs and discussed the procedure including the risks, benefits and alternatives for the proposed anesthesia with the patient or authorized representative who has indicated his/her understanding and acceptance.   Dental Advisory Given  Plan Discussed with: CRNA and Surgeon  Anesthesia Plan Comments: (No cautery to be used. Will keep AICD in functioning  mode for the case. CF)       Anesthesia Quick Evaluation

## 2013-04-09 NOTE — Anesthesia Procedure Notes (Signed)
Procedure Name: Intubation Date/Time: 04/09/2013 7:44 AM Performed by: De Nurse Pre-anesthesia Checklist: Patient identified, Emergency Drugs available, Suction available, Patient being monitored and Timeout performed Patient Re-evaluated:Patient Re-evaluated prior to inductionOxygen Delivery Method: Circle system utilized Preoxygenation: Pre-oxygenation with 100% oxygen Intubation Type: IV induction Ventilation: Mask ventilation without difficulty Laryngoscope Size: Mac and 3 Grade View: Grade I Tube type: Oral Tube size: 7.5 mm Number of attempts: 1 Airway Equipment and Method: Stylet Placement Confirmation: ETT inserted through vocal cords under direct vision,  positive ETCO2 and breath sounds checked- equal and bilateral Secured at: 22 cm Tube secured with: Tape Dental Injury: Teeth and Oropharynx as per pre-operative assessment

## 2013-04-09 NOTE — Transfer of Care (Signed)
Immediate Anesthesia Transfer of Care Note  Patient: [name]  Procedure(s) Performed: Procedure(s): Extraction of tooth #'s 1,2,4,5,6,7,8,9,10,11,12,13,17,18,20,21,22,23,27,28, 29,30,31, and 32 with alveoloplasty. (N/A)  Patient Location: PACU  Anesthesia Type:General  Level of Consciousness: awake, alert  and oriented  Airway & Oxygen Therapy: Patient Spontanous Breathing and Patient connected to nasal cannula oxygen  Post-op Assessment: Report given to PACU RN  Post vital signs: Reviewed and stable  Complications: No apparent anesthesia complications

## 2013-04-09 NOTE — H&P (Signed)
04/09/2013  Patient:            [name] Date of Birth:  [birth year] MRN:                [id number]  Patient denies any acute medical or dental changes of then occasional cough. Patient denies fever, congestion, shortness of breath, or difficulty breathing.  Please see H&P performed by cardiology doctors H&P for dental procedures dated 03/16/13.  Charlynne Pander, DDS

## 2013-04-09 NOTE — H&P (Signed)
04/09/2013  Patient:            [name] Date of Birth:  06/20/1956 MRN:                [id number]  The patient denies any acute medical or dental changes other than an occasional cough. Patient denies fever, shortness of breath, congestion or difficulty breathing. Please see H&P performed by cardiology on 03/16/13 to use as H&P for dental procedures today.  Charlynne Pander, DDS  HPI: [name] is a 66 she will patient of Dr. Ladona Ridgel and Dr. Verdis Prime we are following for ongoing assessment and management after admission for complete heart block status post pacemaker insertion a [employer] CRT-D on 02/09/2013 (#[id number]),ischemic cardiomyopathy and longstanding chronic systolic CHF, EF of 20% to 25%. diffuse hypokinesis.Patient to resume his home medications of carvedilol 6.25 twice a day and lisinopril 10 mg daily, following discharge. Other history includes diabetes mellitus, COPD, hypertension, hyperlipidemia, and prior history of colon cancer.  He is going to be scheduled for full teeth extraction in the next couple of weeks. The patient wanted to touch base with [place] prior to doing so as he is on Plavix and has new PPM. Dr.Robert Kristin Bruins is his dentist at [facility]. Dr. Lubertha Basque note states:  "Spoke with patient and let him know no need to start antibiotic until one week prior to getting his teeth extracted. He will need to take for a total of 2 weeks, 1 week prior and 1 week after. He needs to have his teeth extracted as quickly as possible to decrease the risk of infection."  The patient is requesting that we send a letter to Dr. Kristin Bruins to confirm.  Allergies   Allergen  Reactions   .  Vancomycin  Itching and Rash    Current Outpatient Prescriptions   Medication  Sig  Dispense  Refill   .  albuterol (PROVENTIL HFA;VENTOLIN HFA) 108 (90 BASE) MCG/ACT inhaler  Inhale 2 puffs into the lungs every 6 (six) hours as needed. For shortness of breath     .  albuterol  (PROVENTIL) (2.5 MG/3ML) 0.083% nebulizer solution  Take 2.5 mg by nebulization every 6 (six) hours as needed. For shortness of breath     .  aspirin 325 MG tablet  Take 325 mg by mouth daily.     .  carvedilol (COREG) 6.25 MG tablet  Take 6.25 mg by mouth 2 (two) times daily with a meal.     .  clopidogrel (PLAVIX) 75 MG tablet  Take 75 mg by mouth daily.     .  furosemide (LASIX) 40 MG tablet  Take 40 mg by mouth daily.     .  hydrocodone-acetaminophen (LORCET-HD) 5-500 MG per capsule  Take 1 capsule by mouth every 6 (six) hours as needed for pain.     [name]  insulin glargine (LANTUS) 100 UNIT/ML injection  Inject 20 Units into the skin 2 (two) times daily.     [name]  lisinopril (PRINIVIL) 10 MG tablet  Take 1 tablet (10 mg total) by mouth daily.  30 tablet  12   .  magnesium oxide (MAG-OX) 400 MG tablet  Take 400 mg by mouth 2 (two) times daily.     .  metFORMIN (GLUCOPHAGE) 500 MG tablet  Take 1,000 mg by mouth 2 (two) times daily with a meal.     .  nitroGLYCERIN (NITROSTAT) 0.4 MG SL tablet  Place 0.4 mg under the  tongue every 5 (five) minutes x 3 doses as needed. For chest pain     .  sertraline (ZOLOFT) 50 MG tablet  Take 50 mg by mouth daily.     .  simvastatin (ZOCOR) 80 MG tablet  Take 40 mg by mouth at bedtime.     [name]  tiZANidine (ZANAFLEX) 4 MG tablet  Take 4 mg by mouth 3 (three) times daily.     .  penicillin v potassium (VEETID) 500 MG tablet  prn      No current facility-administered medications for this visit.    Past Medical History   Diagnosis  Date   .  COPD (chronic obstructive pulmonary disease)    .  Arteriosclerotic cardiovascular disease (ASCVD)      Acute MI in 2006-> LAD stent, EF-37%   .  Diabetes mellitus, type II      With peripheral neuropathy   .  Colon cancer  2006     Partial colectomy in 2006   .  Hypertension    .  Hyperlipidemia    .  Carpal tunnel syndrome    .  Tobacco abuse    .  Edema      venous insufficiency   .  Anemia, normocytic normochromic   2008     2008   .  Pneumonia  01/2012     01/2012; subsequent admission for chest pain   .  H/O alcohol dependence    .  Complete heart block      S/P ICD 02/09/13    Past Surgical History   Procedure  Laterality  Date   .  Subtotal colectomy   09/01/2005     subtotal colectomy   .  Coronary angioplasty with stent placement     .  Ankle surgery       Right   .  Inguinal hernia repair     .  Colonoscopy   08/17/2005     Pancolonic diverticula/Multiple rectal polyps removed as described above. The larger of the two likely responsible for intermittent hematochezia/ Multiple polyps at the hepatic flexure resected with a snare./ Large polypoid lesion growing out of the base of the cecum, just behind the ileocecal valve. This was not felt to be amendable to endoscopic resection. Biopsied multiple times   .  Flexible sigmoidoscopy   06/13/2006     Essentially normal residual rectum status post total colectomy. anastomosis at 25cm. Focal area of abnormality adjacent to suture, as described above, likely not significant, suspect granulation tissue, biopsied. Anastomosis otherwise appeared normal. Distal terminal ileum appeared normal   .  Biventricular icd placement   02/09/2013     Dr. Ladona Ridgel   ROS: Review of systems complete and found to be negative unless listed above  PHYSICAL EXAM  BP 110/70  Pulse 63  Ht 6' (1.829 m)  Wt 170 lb (77.111 kg)  BMI 23.05 kg/m2  General: Well developed, well nourished, in no acute distress  Head: Eyes PERRLA, No xanthomas. Normal cephalic and atramatic  Lungs: Clear bilaterally to auscultation and percussion.  Heart: HRRR S1 S2, without MRG. Pulses are 2+ & equal.  No carotid bruit. No JVD. No abdominal bruits. No femoral bruits.  Abdomen: Bowel sounds are positive, abdomen soft and non-tender without masses or  Hernia's noted.  Msk: Back normal, normal gait. Normal strength and tone for age.Pacemker in upper left chest, well healed. No evidence of infection.   Extremities: No clubbing, cyanosis or edema. DP +1  Neuro: Alert and oriented X 3.  Psych: Flat affect, responds appropriately  EKG: Electronic ventricular pacemaker, rate of 63 bpm.  ASSESSMENT AND PLAN             Dental caries - Assessment & Plan Note [name] (MR# [id number])         Dental caries - Assessment & Plan Note Info    Author Note Status Last Update User Last Update Date/Time   Jodelle Gross, NP Written Jodelle Gross, NP 03/16/2013 1:45 PM         Dental caries - Assessment & Plan Note    Patient is cleared from a cardiac standpoint as low risk for cardiac event. Endocarditis prophylaxis will be given with amoxicillin 500 mg BID for one week prior to and one week after dental extraction. He is also to stop Plavix for 5 days prior to procedure, starting back ASAP.

## 2013-04-09 NOTE — Anesthesia Postprocedure Evaluation (Signed)
Anesthesia Post Note  Patient: [name]  Procedure(s) Performed: Procedure(s) (LRB): Extraction of tooth #'s 1,2,4,5,6,7,8,9,10,11,12,13,17,18,20,21,22,23,27,28, 29,30,31, and 32 with alveoloplasty. (N/A)  Anesthesia type: General  Patient location: PACU  Post pain: Pain level controlled  Post assessment: Patient's Cardiovascular Status Stable  Last Vitals:  Filed Vitals:   04/09/13 0955  BP: 145/68  Pulse: 80  Temp: 37.6 C  Resp: 14    Post vital signs: Reviewed and stable  Level of consciousness: alert  Complications: No apparent anesthesia complications

## 2013-04-10 ENCOUNTER — Encounter (HOSPITAL_COMMUNITY): Payer: Self-pay | Admitting: Dentistry

## 2013-04-16 ENCOUNTER — Encounter: Payer: Medicare Other | Admitting: Adult Health

## 2013-04-16 NOTE — Progress Notes (Signed)
No Show

## 2013-04-18 ENCOUNTER — Encounter (HOSPITAL_COMMUNITY): Payer: Self-pay | Admitting: Dentistry

## 2013-04-18 ENCOUNTER — Ambulatory Visit (HOSPITAL_COMMUNITY): Payer: Self-pay | Admitting: Dentistry

## 2013-04-18 VITALS — BP 136/84 | HR 70 | Temp 97.9°F

## 2013-04-18 DIAGNOSIS — K08409 Partial loss of teeth, unspecified cause, unspecified class: Secondary | ICD-10-CM

## 2013-04-18 DIAGNOSIS — K0820 Unspecified atrophy of edentulous alveolar ridge: Secondary | ICD-10-CM

## 2013-04-18 DIAGNOSIS — K08109 Complete loss of teeth, unspecified cause, unspecified class: Secondary | ICD-10-CM

## 2013-04-18 NOTE — Patient Instructions (Signed)
PLAN: 1. Brush tongue twice daily. 2. Continue salt water rinses as needed to aid healing 3. Return to clinic for start of upper and lower complete dentures in approximately 4 weeks as scheduled. Call if problems arise before then.  Dr. Kristin Bruins

## 2013-04-18 NOTE — Progress Notes (Signed)
POST OPERATIVE NOTE:  04/18/2013 [name] [id number]  VITALS: BP 136/84  Pulse 70  Temp(Src) 97.9 F (36.6 C) (Oral)  [name] is status post extraction remaining teeth with alveoloplasty and pre-prosthetic surgery on 04/09/2013. Patient had Plavix therapy discontinued for 5-7 days and then was restarted after the dental extractions. Patient denies having any significant bleeding after the dental procedures.  SUBJECTIVE: Patient is currently having no problems with healing. Patient with minimal discomfort. Patient indicates that initially he did have some " soreness". He indicates that he never really had any significant postoperative bleeding. Patient indicates that he has restarted the Plavix with no bleeding since then.  EXAM: No sign of infection, heme, or ooze. Generalized primary closure is noted. Sutures remain loosely. Normal saliva is noted. Patient does have some plaque accumulations on his tongue and was instructed to brush his tongue twice daily. Patient does have atrophy of the edentulous alveolar ridges.  ASSESSMENT: Post operative course is consistent with dental procedures performed in the operating room.   PLAN: 1. Brush tongue twice daily. 2. Continue salt water rinses as needed to aid healing 3. Return to clinic for start of upper and lower complete dentures in approximately 4 weeks as scheduled. Call if problems arise before then.   [name], DDS

## 2013-05-15 ENCOUNTER — Encounter: Payer: Medicare Other | Admitting: Internal Medicine

## 2013-05-18 ENCOUNTER — Encounter: Payer: Medicare Other | Admitting: Internal Medicine

## 2013-05-21 ENCOUNTER — Encounter (HOSPITAL_COMMUNITY): Payer: Self-pay | Admitting: Dentistry

## 2013-05-21 ENCOUNTER — Ambulatory Visit (HOSPITAL_COMMUNITY): Payer: Self-pay | Admitting: Dentistry

## 2013-05-21 VITALS — BP 121/76 | HR 70 | Temp 98.1°F

## 2013-05-21 DIAGNOSIS — Z463 Encounter for fitting and adjustment of dental prosthetic device: Secondary | ICD-10-CM

## 2013-05-21 DIAGNOSIS — K08109 Complete loss of teeth, unspecified cause, unspecified class: Secondary | ICD-10-CM

## 2013-05-21 DIAGNOSIS — K0820 Unspecified atrophy of edentulous alveolar ridge: Secondary | ICD-10-CM

## 2013-05-21 NOTE — Patient Instructions (Addendum)
Return to clinic as scheduled for continued upper and lower complete denture fabrication. Ronald F. Kulinski, DDS 

## 2013-05-21 NOTE — Progress Notes (Signed)
05/21/2013   Patient:            [name] Date of Birth:  [birth year] MRN:                [id number]  BP 121/76  Pulse 70  Temp(Src) 98.1 F (36.7 C) (Oral)   [name] presents for start of upper and lower denture fabrication. Exam: Patient is edentulous. Discussed procedures involved in upper and lower denture fabrication and prognosis for successful ability to wear dentures. Price for dentures confirmed.  Patient agrees to proceed with upper and lower denture fabrication. Procedure:  Upper and lower denture primary impressions in alginate. Lab pour. To [facility] for upper and lower denture custom tray fabrication. RTC for upper and lower denture final impressions.  Charlynne Pander, DDS

## 2013-05-29 ENCOUNTER — Encounter (HOSPITAL_COMMUNITY): Payer: Self-pay | Admitting: Dentistry

## 2013-05-29 ENCOUNTER — Ambulatory Visit (HOSPITAL_COMMUNITY): Payer: Self-pay | Admitting: Dentistry

## 2013-05-29 VITALS — BP 121/68 | HR 70 | Temp 98.7°F

## 2013-05-29 DIAGNOSIS — K08109 Complete loss of teeth, unspecified cause, unspecified class: Secondary | ICD-10-CM

## 2013-05-29 DIAGNOSIS — K0820 Unspecified atrophy of edentulous alveolar ridge: Secondary | ICD-10-CM

## 2013-05-29 DIAGNOSIS — Z463 Encounter for fitting and adjustment of dental prosthetic device: Secondary | ICD-10-CM

## 2013-05-29 NOTE — Patient Instructions (Addendum)
Return to clinic as scheduled for continued denture fabrication. Charlynne Pander, DDS

## 2013-05-29 NOTE — Progress Notes (Signed)
05/29/2013  Patient:            [name] Date of Birth:  01/18/1956 MRN:                [id number]  BP 121/68  Pulse 70  Temp(Src) 98.7 F (37.1 C) (Oral)  Tonita Phoenix Shell presents for continued upper and lower complete denture fabrication. Procedure: Upper and lower border molding and final impressions in Aquasil. Patient tolerated procedure well. To [facility] for custom baseplates with rims. Return to clinic for upper and lower complete denture jaw relations.  Charlynne Pander, DDS

## 2013-06-06 ENCOUNTER — Ambulatory Visit (HOSPITAL_COMMUNITY): Payer: Self-pay | Admitting: Dentistry

## 2013-06-06 ENCOUNTER — Encounter (HOSPITAL_COMMUNITY): Payer: Self-pay | Admitting: Dentistry

## 2013-06-06 VITALS — BP 102/63 | HR 70 | Temp 98.4°F

## 2013-06-06 DIAGNOSIS — K0820 Unspecified atrophy of edentulous alveolar ridge: Secondary | ICD-10-CM

## 2013-06-06 DIAGNOSIS — Z463 Encounter for fitting and adjustment of dental prosthetic device: Secondary | ICD-10-CM

## 2013-06-06 DIAGNOSIS — K08109 Complete loss of teeth, unspecified cause, unspecified class: Secondary | ICD-10-CM

## 2013-06-06 NOTE — Patient Instructions (Signed)
Return to clinic as scheduled for continued upper and lower complete denture fabrication. Ronald F. Kulinski, DDS 

## 2013-06-06 NOTE — Progress Notes (Signed)
06/06/2013  Patient:            [name] Date of Birth:  Oct 04, 1956 MRN:                [id number]  BP 102/63  Pulse 70  Temp(Src) 98.4 F (36.9 C) (Oral)  Nickola Major presents for continued denture fabrication. Procedure:  Upper and lower denture Jaw relations with aluwax bite registration. Patient agrees to tooth selection of 22E, H, and 10 degree posteriors to match with Portrait A2 shade. Patient tolerated procedure well. RTC for denture wax try in.  Charlynne Pander, DDS

## 2013-06-14 ENCOUNTER — Encounter (HOSPITAL_COMMUNITY): Payer: Self-pay | Admitting: Dentistry

## 2013-06-15 ENCOUNTER — Ambulatory Visit (HOSPITAL_COMMUNITY): Payer: Self-pay | Admitting: Dentistry

## 2013-06-15 ENCOUNTER — Encounter (HOSPITAL_COMMUNITY): Payer: Self-pay | Admitting: Dentistry

## 2013-06-15 VITALS — BP 127/69 | HR 70 | Temp 98.2°F

## 2013-06-15 DIAGNOSIS — Z463 Encounter for fitting and adjustment of dental prosthetic device: Secondary | ICD-10-CM

## 2013-06-15 DIAGNOSIS — K0820 Unspecified atrophy of edentulous alveolar ridge: Secondary | ICD-10-CM

## 2013-06-15 NOTE — Patient Instructions (Signed)
Return to clinic for insertion of upper and lower complete dentures. Charlynne Pander, DDS

## 2013-06-15 NOTE — Progress Notes (Signed)
06/15/2013  Patient:            [name] Date of Birth:  14-Jul-1956 MRN:                [id number]  BP 127/69  Pulse 70  Temp(Src) 98.2 F (36.8 C) (Oral)   [name] presents for continued upper and lower denture fabrication. Procedure: Upper and lower denture wax tryin. Patient accepts esthetics, phonetics, fit and function. Patient agrees to process "as is" in [place] [address]. Patient to RTC for  upper and lower denture insertion.   Charlynne Pander, DDS

## 2013-06-25 ENCOUNTER — Encounter (HOSPITAL_COMMUNITY): Payer: Self-pay | Admitting: Dentistry

## 2013-06-25 ENCOUNTER — Ambulatory Visit (HOSPITAL_COMMUNITY): Payer: Self-pay | Admitting: Dentistry

## 2013-06-25 VITALS — BP 120/69 | HR 70 | Temp 98.3°F

## 2013-06-25 DIAGNOSIS — Z463 Encounter for fitting and adjustment of dental prosthetic device: Secondary | ICD-10-CM

## 2013-06-25 DIAGNOSIS — K0820 Unspecified atrophy of edentulous alveolar ridge: Secondary | ICD-10-CM

## 2013-06-25 DIAGNOSIS — K08109 Complete loss of teeth, unspecified cause, unspecified class: Secondary | ICD-10-CM

## 2013-06-25 NOTE — Progress Notes (Signed)
06/25/2013  Patient:            [name] Date of Birth:  1956/02/13 MRN:                [id number]  BP 120/69  Pulse 70  Temp(Src) 98.3 F (36.8 C) (Oral)  Tonita Phoenix Hao presents for insertion of upper and lower complete dentures. Procedure: Pressure indicating paste applied to dentures. Adjustments made as needed. [name]. Occlusion evaluated and adjustments made as needed for Centric Relation and protrusive strokes. Good esthetics, phonetics, fit, and function noted. Patient accepts results. Post op instructions provided in written and verbal formats on use and care of dentures. Gave patient denture brush and cup. Patient to keep dentures out if sore spots develop. Use salt water rinses as needed to aid healing. Return to clinic as scheduled for denture adjustment.  Call if problems arise before then. Patient dismissed in stable condition.  Charlynne Pander, DDS

## 2013-06-25 NOTE — Patient Instructions (Signed)
Instructions for Denture Use and Care  Congratulations, you are on the way to oral rehabilitation!  You have just received a new set of complete or partial dentures.  These prostheses will help to improve both your appearance and chewing ability.  These instructions will help you get adjusted to your dentures as well as care for them properly.  Please read these instructions carefully and completely as soon as you get home.  If you or your caregiver have any questions please notify the [facility] at [phone].  HOW YOUR DENTURES LOOK AND FEEL Soon after you begin wearing your dentures, you may feel that your dentures are too large or even loose.  As our mouth and facial muscles become accustomed to the dentures, these feelings will go away.  You also may feel that you are salivating more than you normally do.  This feeling should go away as you get used to having the dentures in your mouth.  You may bite your cheek or your tongue; this will eventually resolve itself as you wear your dentures.  Some soreness is to be expected, but you should not hurt.  If your mouth hurts, call your dentist.  A denture adhesive may occasionally be necessary to hold your dentures in place more securely.  The dentist will let you know when one is recommended for you.  SPEAKING Wearing dentures will change the sound of your voice initially.  This will be noticed by you more than anyone else.  Bite and swallow before you speak, in order to place your dentures in position so that you may speak more clearly.  Practice speaking by reading aloud or counting from 1 to 100 very slowly and distinctly.  After some practice your mouth will become accustomed to your dentures and you will speak more clearly.  EATING Chewing will definitely be different after you receive your dentures.  With a little practice and patience you should be able to eat just about any kind of food.  Begin by eating small quantities of food  that are cut into small pieces.  Star with soft foods such as eggs, cooked vegetables, or puddings.  As you gain confidence advance  Your diet to whatever texture foods you can tolerate.  DENTURE CARE Dentures can collect plaque and calculus much the same as natural teeth can.  If not removed on a regular basis, your dentures will not look or feel clean, and you will experience denture odor.  It is very important that you remove your dentures at bedtime and clean them thoroughly.  You should: 1. Clean your dentures over a sink full of water so if dropped, breakage will be prevented. 2. Rinse your dentures with cool water to remove any large food particles. 3. Use soap and water or a denture cleanser or paste to clean the dentures.  Do not use regular toothpaste as it may abrade the denture base or teeth. 4. Use a moistened denture brush to clean all surfaces (inside and outside). 5. Rinse thoroughly to remove any remaining soap or denture cleanser. 6. Use a soft bristle toothbrush to gently brush any natural teeth, gums, tongue, and palate at bedtime and before reinserting your dentures. 7. Do not sleep with your dentures in your mouth at night.  Remove your dentures and soak them overnight in a denture cup filled with water or denture solution as recommended by your dentist.  This routine will become second nature and will increase the life and comfort   of your dentures.  Please do not try to adjust these dentures yourself; you could damage them.  FOLLOW-UP You should call or make an appointment with your dentist.  Your dentist would like to see you at least once a year for a check-up and examination. 

## 2013-07-02 ENCOUNTER — Encounter (HOSPITAL_COMMUNITY): Payer: Self-pay | Admitting: Dentistry

## 2013-07-02 ENCOUNTER — Encounter: Payer: Medicare Other | Admitting: Internal Medicine

## 2013-07-10 ENCOUNTER — Ambulatory Visit (HOSPITAL_COMMUNITY): Payer: Self-pay | Admitting: Dentistry

## 2013-07-10 ENCOUNTER — Encounter (HOSPITAL_COMMUNITY): Payer: Self-pay | Admitting: Dentistry

## 2013-07-10 VITALS — BP 142/77 | HR 70 | Temp 98.0°F

## 2013-07-10 DIAGNOSIS — K0820 Unspecified atrophy of edentulous alveolar ridge: Secondary | ICD-10-CM

## 2013-07-10 DIAGNOSIS — K08109 Complete loss of teeth, unspecified cause, unspecified class: Secondary | ICD-10-CM

## 2013-07-10 DIAGNOSIS — Z463 Encounter for fitting and adjustment of dental prosthetic device: Secondary | ICD-10-CM

## 2013-07-10 NOTE — Progress Notes (Signed)
07/10/2013  Patient:            [name] Date of Birth:  1955/10/24 MRN:                [id number]  BP 142/77  Pulse 70  Temp(Src) 98 F (36.7 C) (Oral)  [name] presents for evaluation of recently inserted upper and lower complete dentures. SUBJECTIVE: Patient denies having any problems with his dentures. OBJECTIVE: There is no sign of denture irritation or erythema. Procedure: Pressure indicating paste applied to dentures. Adjustments made as needed. [name]. Occlusion evaluated and adjustments made as needed for Centric Relation and protrusive strokes. Patient accepts results. Patient to keep dentures out if sore spots develop. Use salt water rinses as needed to aid healing. Return to clinic as scheduled for denture adjustment.  Call if problems arise before then. Patient dismissed in stable condition.  Charlynne Pander, DDS

## 2013-07-10 NOTE — Patient Instructions (Addendum)
Patient to keep dentures out if sore spots develop. Use salt water rinses as needed to aid healing. Return to clinic as scheduled for denture adjustment.   Call if problems arise before then.  [name] F. Ilda Laskin, DDS  

## 2013-07-25 ENCOUNTER — Encounter: Payer: Medicare Other | Admitting: Internal Medicine

## 2013-08-09 ENCOUNTER — Encounter: Payer: Medicare Other | Admitting: Internal Medicine

## 2013-08-14 ENCOUNTER — Encounter (HOSPITAL_COMMUNITY): Payer: Self-pay | Admitting: Dentistry

## 2013-10-11 ENCOUNTER — Encounter: Payer: Medicare Other | Admitting: Internal Medicine

## 2013-12-11 ENCOUNTER — Encounter: Payer: Self-pay | Admitting: *Deleted

## 2014-01-09 ENCOUNTER — Telehealth: Payer: Self-pay | Admitting: Internal Medicine

## 2014-01-09 NOTE — Telephone Encounter (Signed)
New Message:  [facility] billing is calling to get authorization number for a pocedure that Dr. Lovena Le did... It was for --- leads reposition,  cpt - [id number].[name] Alyse Low would like a call back from our office billing dept

## 2014-01-26 ENCOUNTER — Emergency Department (HOSPITAL_COMMUNITY): Payer: Commercial Managed Care - HMO

## 2014-01-26 ENCOUNTER — Inpatient Hospital Stay (HOSPITAL_COMMUNITY): Payer: Commercial Managed Care - HMO

## 2014-01-26 ENCOUNTER — Observation Stay (HOSPITAL_COMMUNITY)
Admission: EM | Admit: 2014-01-26 | Discharge: 2014-01-27 | DRG: 092 | Discharge status: Against Medical Advice | Payer: Commercial Managed Care - HMO | Attending: Pulmonary Disease | Admitting: Pulmonary Disease

## 2014-01-26 ENCOUNTER — Encounter (HOSPITAL_COMMUNITY): Payer: Self-pay | Admitting: Emergency Medicine

## 2014-01-26 DIAGNOSIS — J449 Chronic obstructive pulmonary disease, unspecified: Secondary | ICD-10-CM | POA: Diagnosis not present

## 2014-01-26 DIAGNOSIS — I872 Venous insufficiency (chronic) (peripheral): Secondary | ICD-10-CM | POA: Diagnosis not present

## 2014-01-26 DIAGNOSIS — Z8249 Family history of ischemic heart disease and other diseases of the circulatory system: Secondary | ICD-10-CM

## 2014-01-26 DIAGNOSIS — R42 Dizziness and giddiness: Secondary | ICD-10-CM | POA: Diagnosis present

## 2014-01-26 DIAGNOSIS — E1149 Type 2 diabetes mellitus with other diabetic neurological complication: Secondary | ICD-10-CM | POA: Diagnosis not present

## 2014-01-26 DIAGNOSIS — Z833 Family history of diabetes mellitus: Secondary | ICD-10-CM

## 2014-01-26 DIAGNOSIS — M129 Arthropathy, unspecified: Secondary | ICD-10-CM | POA: Diagnosis not present

## 2014-01-26 DIAGNOSIS — Z794 Long term (current) use of insulin: Secondary | ICD-10-CM | POA: Diagnosis not present

## 2014-01-26 DIAGNOSIS — D649 Anemia, unspecified: Secondary | ICD-10-CM | POA: Diagnosis present

## 2014-01-26 DIAGNOSIS — Z79899 Other long term (current) drug therapy: Secondary | ICD-10-CM

## 2014-01-26 DIAGNOSIS — Z9049 Acquired absence of other specified parts of digestive tract: Secondary | ICD-10-CM | POA: Diagnosis not present

## 2014-01-26 DIAGNOSIS — Z9861 Coronary angioplasty status: Secondary | ICD-10-CM

## 2014-01-26 DIAGNOSIS — Z950 Presence of cardiac pacemaker: Secondary | ICD-10-CM

## 2014-01-26 DIAGNOSIS — F17200 Nicotine dependence, unspecified, uncomplicated: Secondary | ICD-10-CM | POA: Diagnosis not present

## 2014-01-26 DIAGNOSIS — E785 Hyperlipidemia, unspecified: Secondary | ICD-10-CM | POA: Diagnosis present

## 2014-01-26 DIAGNOSIS — Z720 Tobacco use: Secondary | ICD-10-CM

## 2014-01-26 DIAGNOSIS — Z800 Family history of malignant neoplasm of digestive organs: Secondary | ICD-10-CM | POA: Diagnosis not present

## 2014-01-26 DIAGNOSIS — Z85038 Personal history of other malignant neoplasm of large intestine: Secondary | ICD-10-CM | POA: Diagnosis not present

## 2014-01-26 DIAGNOSIS — R279 Unspecified lack of coordination: Secondary | ICD-10-CM | POA: Diagnosis not present

## 2014-01-26 DIAGNOSIS — I10 Essential (primary) hypertension: Secondary | ICD-10-CM | POA: Diagnosis present

## 2014-01-26 DIAGNOSIS — I7090 Unspecified atherosclerosis: Secondary | ICD-10-CM

## 2014-01-26 DIAGNOSIS — I2510 Atherosclerotic heart disease of native coronary artery without angina pectoris: Secondary | ICD-10-CM | POA: Diagnosis not present

## 2014-01-26 DIAGNOSIS — I5022 Chronic systolic (congestive) heart failure: Secondary | ICD-10-CM | POA: Diagnosis present

## 2014-01-26 DIAGNOSIS — I509 Heart failure, unspecified: Secondary | ICD-10-CM | POA: Diagnosis present

## 2014-01-26 DIAGNOSIS — Z95810 Presence of automatic (implantable) cardiac defibrillator: Secondary | ICD-10-CM | POA: Diagnosis not present

## 2014-01-26 DIAGNOSIS — Z7902 Long term (current) use of antithrombotics/antiplatelets: Secondary | ICD-10-CM

## 2014-01-26 DIAGNOSIS — I252 Old myocardial infarction: Secondary | ICD-10-CM | POA: Diagnosis not present

## 2014-01-26 DIAGNOSIS — E1142 Type 2 diabetes mellitus with diabetic polyneuropathy: Secondary | ICD-10-CM | POA: Diagnosis not present

## 2014-01-26 DIAGNOSIS — Z7982 Long term (current) use of aspirin: Secondary | ICD-10-CM | POA: Diagnosis not present

## 2014-01-26 DIAGNOSIS — J4489 Other specified chronic obstructive pulmonary disease: Secondary | ICD-10-CM | POA: Diagnosis present

## 2014-01-26 DIAGNOSIS — R270 Ataxia, unspecified: Secondary | ICD-10-CM | POA: Diagnosis present

## 2014-01-26 DIAGNOSIS — E119 Type 2 diabetes mellitus without complications: Secondary | ICD-10-CM

## 2014-01-26 LAB — GLUCOSE, CAPILLARY
GLUCOSE-CAPILLARY: 88 mg/dL (ref 70–99)
GLUCOSE-CAPILLARY: 92 mg/dL (ref 70–99)
Glucose-Capillary: 159 mg/dL — ABNORMAL HIGH (ref 70–99)

## 2014-01-26 LAB — URINALYSIS, ROUTINE W REFLEX MICROSCOPIC
Bilirubin Urine: NEGATIVE
GLUCOSE, UA: 100 mg/dL — AB
KETONES UR: NEGATIVE mg/dL
Leukocytes, UA: NEGATIVE
Nitrite: NEGATIVE
PROTEIN: 100 mg/dL — AB
Specific Gravity, Urine: >1.03 — ABNORMAL HIGH (ref 1.005–1.030)
Urobilinogen, UA: 0.2 mg/dL (ref 0.0–1.0)
pH: 5.5 (ref 5.0–8.0)

## 2014-01-26 LAB — CBC WITH DIFFERENTIAL/PLATELET
BASOS ABS: 0 10*3/uL (ref 0.0–0.1)
Basophils Relative: 1 % (ref 0–1)
Eosinophils Absolute: 0.2 10*3/uL (ref 0.0–0.7)
Eosinophils Relative: 3 % (ref 0–5)
HCT: 39 % (ref 39.0–52.0)
Hemoglobin: 12.8 g/dL — ABNORMAL LOW (ref 13.0–17.0)
Lymphocytes Relative: 23 % (ref 12–46)
Lymphs Abs: 1.5 10*3/uL (ref 0.7–4.0)
MCH: 29.8 pg (ref 26.0–34.0)
MCHC: 32.8 g/dL (ref 30.0–36.0)
MCV: 90.7 fL (ref 78.0–100.0)
MONO ABS: 0.5 10*3/uL (ref 0.1–1.0)
Monocytes Relative: 8 % (ref 3–12)
NEUTROS ABS: 4.2 10*3/uL (ref 1.7–7.7)
NEUTROS PCT: 65 % (ref 43–77)
PLATELETS: 177 10*3/uL (ref 150–400)
RBC: 4.3 MIL/uL (ref 4.22–5.81)
RDW: 14.4 % (ref 11.5–15.5)
WBC: 6.4 10*3/uL (ref 4.0–10.5)

## 2014-01-26 LAB — COMPREHENSIVE METABOLIC PANEL
ALT: 12 U/L (ref 0–53)
AST: 24 U/L (ref 0–37)
Albumin: 3.9 g/dL (ref 3.5–5.2)
Alkaline Phosphatase: 126 U/L — ABNORMAL HIGH (ref 39–117)
BUN: 9 mg/dL (ref 6–23)
CO2: 29 mEq/L (ref 19–32)
CREATININE: 0.88 mg/dL (ref 0.50–1.35)
Calcium: 9.4 mg/dL (ref 8.4–10.5)
Chloride: 104 mEq/L (ref 96–112)
GFR calc Af Amer: >90 mL/min (ref >90)
GFR calc non Af Amer: >90 mL/min (ref >90)
Glucose, Bld: 207 mg/dL — ABNORMAL HIGH (ref 70–99)
POTASSIUM: 5 meq/L (ref 3.7–5.3)
Sodium: 143 mEq/L (ref 137–147)
Total Bilirubin: 0.2 mg/dL — ABNORMAL LOW (ref 0.3–1.2)
Total Protein: 7.6 g/dL (ref 6.0–8.3)

## 2014-01-26 LAB — ETHANOL: Alcohol, Ethyl (B): <11 mg/dL (ref 0–11)

## 2014-01-26 LAB — I-STAT CHEM 8, ED
BUN: 8 mg/dL (ref 6–23)
CREATININE: 1 mg/dL (ref 0.50–1.35)
Calcium, Ion: 1.21 mmol/L (ref 1.12–1.23)
Chloride: 104 mEq/L (ref 96–112)
GLUCOSE: 197 mg/dL — AB (ref 70–99)
HCT: 39 % (ref 39.0–52.0)
Hemoglobin: 13.3 g/dL (ref 13.0–17.0)
Potassium: 4.9 mEq/L (ref 3.7–5.3)
Sodium: 143 mEq/L (ref 137–147)
TCO2: 28 mmol/L (ref 0–100)

## 2014-01-26 LAB — URINE MICROSCOPIC-ADD ON

## 2014-01-26 LAB — RAPID URINE DRUG SCREEN, HOSP PERFORMED
AMPHETAMINES: NOT DETECTED
BENZODIAZEPINES: NOT DETECTED
Barbiturates: NOT DETECTED
Cocaine: NOT DETECTED
Opiates: NOT DETECTED
Tetrahydrocannabinol: NOT DETECTED

## 2014-01-26 LAB — TROPONIN I: Troponin I: <0.3 ng/mL (ref <0.30)

## 2014-01-26 MED ORDER — ASPIRIN 325 MG PO TABS
325.0000 mg | ORAL_TABLET | Freq: Every day | ORAL | Status: DC
  Administered 2014-01-26 – 2014-01-27 (×2): 325 mg via ORAL
  Filled 2014-01-26 (×2): qty 1

## 2014-01-26 MED ORDER — ALBUTEROL SULFATE (2.5 MG/3ML) 0.083% IN NEBU: 2.5000 mg | INHALATION_SOLUTION | Freq: Four times a day (QID) | RESPIRATORY_TRACT | Status: DC | PRN

## 2014-01-26 MED ORDER — INSULIN ASPART 100 UNIT/ML ~~LOC~~ SOLN
0.0000 [IU] | Freq: Three times a day (TID) | SUBCUTANEOUS | Status: DC
  Administered 2014-01-26: 3 [IU] via SUBCUTANEOUS

## 2014-01-26 MED ORDER — LISINOPRIL 10 MG PO TABS
10.0000 mg | ORAL_TABLET | Freq: Every day | ORAL | Status: DC
  Administered 2014-01-26 – 2014-01-27 (×2): 10 mg via ORAL
  Filled 2014-01-26 (×2): qty 1

## 2014-01-26 MED ORDER — OXYCODONE-ACETAMINOPHEN 5-325 MG PO TABS: 1.0000 | ORAL_TABLET | Freq: Four times a day (QID) | ORAL | Status: DC | PRN

## 2014-01-26 MED ORDER — ACETAMINOPHEN 325 MG PO TABS: 650.0000 mg | ORAL_TABLET | ORAL | Status: DC | PRN

## 2014-01-26 MED ORDER — INSULIN ASPART 100 UNIT/ML ~~LOC~~ SOLN: 0.0000 [IU] | Freq: Every day | SUBCUTANEOUS | Status: DC

## 2014-01-26 MED ORDER — ATORVASTATIN CALCIUM 40 MG PO TABS
40.0000 mg | ORAL_TABLET | Freq: Every day | ORAL | Status: DC
  Administered 2014-01-26: 40 mg via ORAL
  Filled 2014-01-26 (×2): qty 1

## 2014-01-26 MED ORDER — NITROGLYCERIN 0.4 MG SL SUBL: 0.4000 mg | SUBLINGUAL_TABLET | SUBLINGUAL | Status: DC | PRN

## 2014-01-26 MED ORDER — CARVEDILOL 3.125 MG PO TABS
6.2500 mg | ORAL_TABLET | Freq: Two times a day (BID) | ORAL | Status: DC
  Administered 2014-01-26 – 2014-01-27 (×2): 6.25 mg via ORAL
  Filled 2014-01-26 (×3): qty 2

## 2014-01-26 MED ORDER — MAGNESIUM OXIDE 400 (241.3 MG) MG PO TABS
400.0000 mg | ORAL_TABLET | Freq: Two times a day (BID) | ORAL | Status: DC
  Administered 2014-01-26 – 2014-01-27 (×3): 400 mg via ORAL
  Filled 2014-01-26 (×3): qty 1

## 2014-01-26 MED ORDER — INSULIN GLARGINE 100 UNIT/ML ~~LOC~~ SOLN
20.0000 [IU] | Freq: Two times a day (BID) | SUBCUTANEOUS | Status: DC
  Administered 2014-01-27: 20 [IU] via SUBCUTANEOUS
  Filled 2014-01-26 (×5): qty 0.2

## 2014-01-26 MED ORDER — GABAPENTIN 300 MG PO CAPS
300.0000 mg | ORAL_CAPSULE | Freq: Every day | ORAL | Status: DC
  Administered 2014-01-26: 300 mg via ORAL
  Filled 2014-01-26: qty 1

## 2014-01-26 MED ORDER — MECLIZINE HCL 12.5 MG PO TABS
25.0000 mg | ORAL_TABLET | Freq: Once | ORAL | Status: AC
  Administered 2014-01-26: 25 mg via ORAL
  Filled 2014-01-26: qty 2

## 2014-01-26 MED ORDER — FUROSEMIDE 40 MG PO TABS
40.0000 mg | ORAL_TABLET | Freq: Every day | ORAL | Status: DC
  Administered 2014-01-26 – 2014-01-27 (×2): 40 mg via ORAL
  Filled 2014-01-26 (×2): qty 1

## 2014-01-26 MED ORDER — ACETAMINOPHEN 650 MG RE SUPP: 650.0000 mg | RECTAL | Status: DC | PRN

## 2014-01-26 MED ORDER — SERTRALINE HCL 50 MG PO TABS
50.0000 mg | ORAL_TABLET | Freq: Every day | ORAL | Status: DC
  Administered 2014-01-26 – 2014-01-27 (×2): 50 mg via ORAL
  Filled 2014-01-26 (×2): qty 1

## 2014-01-26 MED ORDER — ENOXAPARIN SODIUM 40 MG/0.4ML ~~LOC~~ SOLN
40.0000 mg | SUBCUTANEOUS | Status: DC
  Administered 2014-01-26 – 2014-01-27 (×2): 40 mg via SUBCUTANEOUS
  Filled 2014-01-26 (×2): qty 0.4

## 2014-01-26 MED ORDER — SENNOSIDES-DOCUSATE SODIUM 8.6-50 MG PO TABS: 1.0000 | ORAL_TABLET | Freq: Every evening | ORAL | Status: DC | PRN

## 2014-01-26 MED ORDER — CLOPIDOGREL BISULFATE 75 MG PO TABS
75.0000 mg | ORAL_TABLET | Freq: Every day | ORAL | Status: DC
  Administered 2014-01-27: 75 mg via ORAL
  Filled 2014-01-26: qty 1

## 2014-01-26 MED ORDER — SODIUM CHLORIDE 0.9 % IV BOLUS (SEPSIS)
1000.0000 mL | Freq: Once | INTRAVENOUS | Status: AC
  Administered 2014-01-26: 1000 mL via INTRAVENOUS

## 2014-01-26 MED ORDER — TIZANIDINE HCL 4 MG PO TABS
4.0000 mg | ORAL_TABLET | Freq: Three times a day (TID) | ORAL | Status: DC
  Administered 2014-01-26 – 2014-01-27 (×4): 4 mg via ORAL
  Filled 2014-01-26 (×7): qty 1

## 2014-01-26 NOTE — ED Notes (Signed)
Pt c/o feeling as if the room is spinning. Pt unstable when trying to walk according to EMS.

## 2014-01-26 NOTE — Progress Notes (Signed)
01/26/14 2302 Patient CBG: 92 tonight, Lantus 20 units SQ ordered for bedtime. Discussed with patient. States he does not take Lantus if CBG is low at home. Preferred not to take at this time. [name], RN

## 2014-01-26 NOTE — ED Notes (Signed)
Pt up and ambulated to restroom. States a little dizziness remains but is better than it was previously. Pt ambulated without difficulty.

## 2014-01-26 NOTE — ED Notes (Signed)
Attempted report. Nurse to call back.

## 2014-01-26 NOTE — H&P (Signed)
[facility] History and Physical  [name] UXL:[id number] DOB: April 02, 1956 DOA: 01/26/2014  Referring physician: Noemi Chapel, ER physician PCP: Alonza Bogus, MD   Chief Complaint: dizziness  HPI: [name] is a 58 y.o. male with multiple medical problems including COPD, chronic systolic congestive heart failure, history of complete heart block status post pacemaker/AICD, coronary artery disease. Patient presents to the emergency room today with complaints of dizziness. He is unsure regarding the exact onset of symptoms, but feels that it may be around 4:00 this morning when the patient was getting ready to go to bed. The patient was sitting on his couch and when he stood up, had difficulty walking and felt as if he was going to pass out. Denies any nausea, vomiting, chest pain or shortness of breath. His symptoms persisted and therefore he presented to the emergency room for evaluation. He denies any headache, blurry vision, unilateral weakness or numbness in any extremity. He denies being started on any new medications. He did not take any extra medications. Denies any fever. Reports by mouth intake has been adequate. He did report taking extra salt on his food, and felt that his blood pressure was elevated which may have been causing his symptoms. He was evaluated in the emergency room where lab work and CT of the head was unremarkable. It was noted that he did have a significant ataxia which was likely felt to be central ataxia. The patient was referred for admission regarding further workup.   Review of Systems:  Pertinent positives as per HPI, otherwise negative  Past Medical History  Diagnosis Date  . COPD (chronic obstructive pulmonary disease)   . Arteriosclerotic cardiovascular disease (ASCVD)     Acute MI in 2006->  LAD stent, EF-37%  . Diabetes mellitus, type II     With peripheral neuropathy  . Colon cancer 2006    Partial colectomy in 2006  . Hypertension     . Hyperlipidemia   . Carpal tunnel syndrome   . Tobacco abuse   . Edema     venous insufficiency  . Anemia, normocytic normochromic 2008    2008  . Pneumonia 01/2012    01/2012; subsequent admission for chest pain  . H/O alcohol dependence   . Complete heart block     S/P ICD 02/09/13  . Coronary artery disease   . Myocardial infarction     Hx: of 2006  . Pacemaker   . Automatic implantable cardioverter-defibrillator in situ   . Shortness of breath     Hx: of   . Neuropathy in diabetes     Hx: of  . H/O hiatal hernia   . Arthritis     Hx: of in hands   Past Surgical History  Procedure Laterality Date  . Subtotal colectomy  09/01/2005    subtotal colectomy  . Coronary angioplasty with stent placement    . Ankle surgery      Right  . Inguinal hernia repair    . Colonoscopy  08/17/2005    Pancolonic diverticula/Multiple rectal polyps removed as described above. The larger of the two likely responsible for intermittent hematochezia/ Multiple polyps at the hepatic flexure resected with a snare./ Large polypoid lesion growing out of the base of the cecum, just behind the  ileocecal valve. This was not felt to be amendable to endoscopic resection. Biopsied multiple times  . Flexible sigmoidoscopy    06/13/2006    Essentially normal residual rectum status post total colectomy. anastomosis at  25cm.  Focal area of abnormality adjacent to suture, as described above, likely not significant, suspect granulation tissue, biopsied.  Anastomosis otherwise appeared normal.  Distal terminal ileum appeared normal  . Biventricular icd placement  02/09/2013    Dr. Lovena Le  . Multiple extractions with alveoloplasty N/A 04/09/2013    Procedure: Extraction of tooth #'s 1,2,4,5,6,7,8,9,10,11,12,13,17,18,20,21,22,23,27,28, 29,30,31, and 32 with alveoloplasty.;  Surgeon: Lenn Cal, DDS;  Location: [place];  Service: Oral Surgery;  Laterality: N/A;   Social History:  reports that he has been smoking  Cigarettes.  He has a 40 pack-year smoking history. He has never used smokeless tobacco. He reports that he does not drink alcohol or use illicit drugs.  Allergies  Allergen Reactions  . Vancomycin Itching and Rash    Family History  Problem Relation Age of Onset  . Colon cancer Mother     [name], ?>age 58.  [name] Heart disease Mother   . Heart disease Father   . Liver disease Neg Hx   . Hypertension Brother   . Heart disease Brother   . Diabetes Brother      Prior to Admission medications   Medication Sig Start Date End Date Taking? Authorizing Provider  albuterol (PROVENTIL HFA;VENTOLIN HFA) 108 (90 BASE) MCG/ACT inhaler Inhale 2 puffs into the lungs every 6 (six) hours as needed. For shortness of breath    Historical Provider, MD  albuterol (PROVENTIL) (2.5 MG/3ML) 0.083% nebulizer solution Take 2.5 mg by nebulization every 6 (six) hours as needed. For shortness of breath    Historical Provider, MD  aspirin 325 MG tablet Take 325 mg by mouth daily.    Historical Provider, MD  carvedilol (COREG) 6.25 MG tablet Take 6.25 mg by mouth 2 (two) times daily with a meal.  05/04/12   Historical Provider, MD  clopidogrel (PLAVIX) 75 MG tablet Take 75 mg by mouth.    Historical Provider, MD  furosemide (LASIX) 40 MG tablet Take 40 mg by mouth daily.    Historical Provider, MD  gabapentin (NEURONTIN) 300 MG capsule Take 300 mg by mouth at bedtime.    Historical Provider, MD  insulin glargine (LANTUS) 100 UNIT/ML injection Inject 20 Units into the skin 2 (two) times daily.     Historical Provider, MD  lisinopril (PRINIVIL) 10 MG tablet Take 1 tablet (10 mg total) by mouth daily. 01/21/12   Alonza Bogus, MD  magnesium oxide (MAG-OX) 400 MG tablet Take 400 mg by mouth 2 (two) times daily.    Historical Provider, MD  metFORMIN (GLUCOPHAGE) 500 MG tablet Take 1,000 mg by mouth 2 (two) times daily with a meal.    Historical Provider, MD  nitroGLYCERIN (NITROSTAT) 0.4 MG SL tablet Place 0.4 mg  under the tongue every 5 (five) minutes x 3 doses as needed. For chest pain    Historical Provider, MD  oxyCODONE-acetaminophen (PERCOCET) 5-325 MG per tablet Take one or two tablets by mouth every 4-6 hours as needed for pain. 04/09/13   Lenn Cal, DDS  promethazine (PHENERGAN) 25 MG tablet Take 25 mg by mouth every 6 (six) hours as needed for nausea.    Historical Provider, MD  sertraline (ZOLOFT) 50 MG tablet Take 50 mg by mouth daily.    Historical Provider, MD  simvastatin (ZOCOR) 80 MG tablet Take 40 mg by mouth at bedtime.    Historical Provider, MD  tiZANidine (ZANAFLEX) 4 MG tablet Take 4 mg by mouth 3 (three) times daily.    Historical Provider,  MD   Physical Exam: Filed Vitals:   01/26/14 0800  BP: 153/71  Pulse: 70  Temp:   Resp: 16    BP 153/71  Pulse 70  Temp(Src) 98 F (36.7 C) (Oral)  Resp 16  Ht 6' (1.829 m)  Wt 77.111 kg (170 lb)  BMI 23.05 kg/m2  SpO2 99%  General:  Appears calm and comfortable Eyes: PERRL, normal lids, irises & conjunctiva ENT: grossly normal hearing, lips & tongue Neck: no LAD, masses or thyromegaly Cardiovascular: RRR, no m/r/g. No LE edema. Telemetry: SR, no arrhythmias  Respiratory: CTA bilaterally, no w/r/r. Normal respiratory effort. Abdomen: soft, ntnd Skin: no rash or induration seen on limited exam Musculoskeletal: grossly normal tone BUE/BLE Psychiatric: grossly normal mood and affect, speech fluent and appropriate Neurologic: strength is 5/5 in upper and lower extremities bilaterally, no facial asymmetry, no pronator drift.  No nystagmus. Patient has ataxic gait, no limb ataxia          Labs on Admission:  Basic Metabolic Panel:  Recent Labs Lab 01/26/14 0626 01/26/14 0650  NA 143 143  K 5.0 4.9  CL 104 104  CO2 29  --   GLUCOSE 207* 197*  BUN 9 8  CREATININE 0.88 1.00  CALCIUM 9.4  --    Liver Function Tests:  Recent Labs Lab 01/26/14 0626  AST 24  ALT 12  ALKPHOS 126*  BILITOT 0.2*  PROT 7.6    ALBUMIN 3.9   No results found for this basename: LIPASE, AMYLASE,  in the last 168 hours No results found for this basename: AMMONIA,  in the last 168 hours CBC:  Recent Labs Lab 01/26/14 0626 01/26/14 0650  WBC 6.4  --   NEUTROABS 4.2  --   HGB 12.8* 13.3  HCT 39.0 39.0  MCV 90.7  --   PLT 177  --    Cardiac Enzymes:  Recent Labs Lab 01/26/14 0626  TROPONINI <0.30    BNP (last 3 results) No results found for this basename: PROBNP,  in the last 8760 hours CBG: No results found for this basename: GLUCAP,  in the last 168 hours  Radiological Exams on Admission: Ct Head Wo Contrast  01/26/2014   CLINICAL DATA:  Dizziness  EXAM: CT HEAD WITHOUT CONTRAST  TECHNIQUE: Contiguous axial images were obtained from the base of the skull through the vertex without intravenous contrast.  COMPARISON:  CT HEAD W/O CM dated 03/26/2010; CT HEAD W/O CM dated 06/14/2005  FINDINGS: Calvarium is intact with no significant inflammatory change in the visualized sinuses. Mild diffuse atrophy is stable. No evidence of vascular territory infarct identified. No hemorrhage or extra-axial fluid. No hydrocephalus.  IMPRESSION: No acute intracranial abnormality.   Electronically Signed   By: Skipper Cliche M.D.   On: 01/26/2014 07:09    EKG: Independently reviewed. Paced rhythm   Assessment/Plan Active Problems:   COPD (chronic obstructive pulmonary disease)   Arteriosclerotic cardiovascular disease (ASCVD)   Diabetes mellitus, type II   Hypertension   Tobacco abuse   Pacemaker   Dizziness   Chronic systolic CHF (congestive heart failure)   Ataxia   1. Ataxia. With the patient's persistent central ataxia, which does not appear to be reproducible with head movements, consideration is being given for possible posterior circulation CVA. Patient was not felt a candidate for TPA, since his NIH score was 0, and exact time of onset of symptoms is unclear. Since the patient does have a pacemaker, this  would preclude any MRI  imaging. He will be admitted to the hospital for observation. We will repeat a CT scan of the head tomorrow to monitor for any developing infarct. Check carotid Dopplers and 2-D echocardiogram. Further orders per the stroke orders set. Physical therapy consult will be ordered. The patient is already on full dose aspirin and Plavix. This will be continued. Continue outpatient dose of statin. He reports having similar symptoms in the past when he had been found to be in complete heart block and had pacemaker placed. We will monitor him on telemetry. Urinalysis and urine drug screen are currently pending. Continue his regular medicines. 2. Diabetes. Hold oral agents and use sliding scale insulin. 3. Chronic systolic congestive heart failure. Appears to be compensated at this time. 4. Tobacco use. Patient was counseled regarding the importance of tobacco cessation. 5. Hypertension. Continue outpatient regimen.  Code Status: full code Family Communication: discussed with patient  Disposition Plan: discharge home when improved  Time spent: 2mins  Coleen Cardiff [facility] Pager [phone]

## 2014-01-26 NOTE — ED Provider Notes (Signed)
CSN: [id number]     Arrival date & time 01/26/14  8101 History   First MD Initiated Contact with Patient 01/26/14 [phone]     Chief Complaint  Patient presents with  . Dizziness     (Consider location/radiation/quality/duration/timing/severity/associated sxs/prior Treatment) HPI Comments: 58 year old male with a history of complete heart block status post implantable cardio defibrillator and pacemaker that was placed in May of 2014 presents with a complaint of acute onset of generalized weakness and feeling off balance. He states this feeling of disequilibrium occurred when he tried to get up off of the couch at home. He had been sitting on the couch most of the evening without symptoms. He had had a normal appetite, denied chest pain shortness of breath headache blurred vision weakness numbness abdominal pain back pain diarrhea dysuria or bloody stools prior to the onset of symptoms. The symptoms started over the last 2 hours, they have been persistent, it seems to get worse when he stands but is persistently been sitting position. He feels like his Librium is off as if he cannot walk straight. There is a portion of his dizziness but feels as though he is near syncopal and a portion that feels vertiginous though he denies nausea vomiting or diaphoresis. He has never had these symptoms in the past except for the period where his heart went into a heart block one year ago. He has not had problems since after having his pacemaker placed. He has not had this interrogated recently.  Patient is a 58 y.o. male presenting with dizziness. The history is provided by the patient and the EMS personnel.  Dizziness   Past Medical History  Diagnosis Date  . COPD (chronic obstructive pulmonary disease)   . Arteriosclerotic cardiovascular disease (ASCVD)     Acute MI in 2006->  LAD stent, EF-37%  . Diabetes mellitus, type II     With peripheral neuropathy  . Colon cancer 2006    Partial colectomy in 2006  .  Hypertension   . Hyperlipidemia   . Carpal tunnel syndrome   . Tobacco abuse   . Edema     venous insufficiency  . Anemia, normocytic normochromic 2008    2008  . Pneumonia 01/2012    01/2012; subsequent admission for chest pain  . H/O alcohol dependence   . Complete heart block     S/P ICD 02/09/13  . Coronary artery disease   . Myocardial infarction     Hx: of 2006  . Pacemaker   . Automatic implantable cardioverter-defibrillator in situ   . Shortness of breath     Hx: of   . Neuropathy in diabetes     Hx: of  . H/O hiatal hernia   . Arthritis     Hx: of in hands   Past Surgical History  Procedure Laterality Date  . Subtotal colectomy  09/01/2005    subtotal colectomy  . Coronary angioplasty with stent placement    . Ankle surgery      Right  . Inguinal hernia repair    . Colonoscopy  08/17/2005    Pancolonic diverticula/Multiple rectal polyps removed as described above. The larger of the two likely responsible for intermittent hematochezia/ Multiple polyps at the hepatic flexure resected with a snare./ Large polypoid lesion growing out of the base of the cecum, just behind the  ileocecal valve. This was not felt to be amendable to endoscopic resection. Biopsied multiple times  . Flexible sigmoidoscopy    06/13/2006  Essentially normal residual rectum status post total colectomy. anastomosis at 25cm.  Focal area of abnormality adjacent to suture, as described above, likely not significant, suspect granulation tissue, biopsied.  Anastomosis otherwise appeared normal.  Distal terminal ileum appeared normal  . Biventricular icd placement  02/09/2013    Dr. Lovena Le  . Multiple extractions with alveoloplasty N/A 04/09/2013    Procedure: Extraction of tooth #'s 1,2,4,5,6,7,8,9,10,11,12,13,17,18,20,21,22,23,27,28, 29,30,31, and 32 with alveoloplasty.;  Surgeon: Lenn Cal, DDS;  Location: [place];  Service: Oral Surgery;  Laterality: N/A;   Family History  Problem Relation Age of  Onset  . Colon cancer Mother     Hajime Asfaw, ?>age 75.  [name] Heart disease Mother   . Heart disease Father   . Liver disease Neg Hx   . Hypertension Brother   . Heart disease Brother   . Diabetes Brother    History  Substance Use Topics  . Smoking status: Current Every Day Smoker -- 1.00 packs/day for 40 years    Types: Cigarettes  . Smokeless tobacco: Never Used  . Alcohol Use: No     Comment: Quit 2005 from a 6 pack a day use    Review of Systems  Neurological: Positive for dizziness.  All other systems reviewed and are negative.     Allergies  Vancomycin  Home Medications   Prior to Admission medications   Medication Sig Start Date End Date Taking? Authorizing Provider  albuterol (PROVENTIL HFA;VENTOLIN HFA) 108 (90 BASE) MCG/ACT inhaler Inhale 2 puffs into the lungs every 6 (six) hours as needed. For shortness of breath    Historical Provider, MD  albuterol (PROVENTIL) (2.5 MG/3ML) 0.083% nebulizer solution Take 2.5 mg by nebulization every 6 (six) hours as needed. For shortness of breath    Historical Provider, MD  aspirin 325 MG tablet Take 325 mg by mouth daily.    Historical Provider, MD  carvedilol (COREG) 6.25 MG tablet Take 6.25 mg by mouth 2 (two) times daily with a meal.  05/04/12   Historical Provider, MD  clopidogrel (PLAVIX) 75 MG tablet Take 75 mg by mouth.    Historical Provider, MD  furosemide (LASIX) 40 MG tablet Take 40 mg by mouth daily.    Historical Provider, MD  gabapentin (NEURONTIN) 300 MG capsule Take 300 mg by mouth at bedtime.    Historical Provider, MD  insulin glargine (LANTUS) 100 UNIT/ML injection Inject 20 Units into the skin 2 (two) times daily.     Historical Provider, MD  lisinopril (PRINIVIL) 10 MG tablet Take 1 tablet (10 mg total) by mouth daily. 01/21/12   Alonza Bogus, MD  magnesium oxide (MAG-OX) 400 MG tablet Take 400 mg by mouth 2 (two) times daily.    Historical Provider, MD  metFORMIN (GLUCOPHAGE) 500 MG tablet Take 1,000 mg  by mouth 2 (two) times daily with a meal.    Historical Provider, MD  nitroGLYCERIN (NITROSTAT) 0.4 MG SL tablet Place 0.4 mg under the tongue every 5 (five) minutes x 3 doses as needed. For chest pain    Historical Provider, MD  oxyCODONE-acetaminophen (PERCOCET) 5-325 MG per tablet Take one or two tablets by mouth every 4-6 hours as needed for pain. 04/09/13   Lenn Cal, DDS  promethazine (PHENERGAN) 25 MG tablet Take 25 mg by mouth every 6 (six) hours as needed for nausea.    Historical Provider, MD  sertraline (ZOLOFT) 50 MG tablet Take 50 mg by mouth daily.    Historical Provider, MD  simvastatin (ZOCOR) 80 MG tablet Take 40 mg by mouth at bedtime.    Historical Provider, MD  tiZANidine (ZANAFLEX) 4 MG tablet Take 4 mg by mouth 3 (three) times daily.    Historical Provider, MD   BP 136/63  Pulse 70  Temp(Src) 97.7 F (36.5 C) (Oral)  Resp 20  Ht 6' (1.829 m)  Wt 178 lb 9.2 oz (81 kg)  BMI 24.21 kg/m2  SpO2 98% Physical Exam  Nursing note and vitals reviewed. Constitutional: He appears well-developed and well-nourished. No distress.  HENT:  Head: Normocephalic and atraumatic.  Mouth/Throat: Oropharynx is clear and moist. No oropharyngeal exudate.  Eyes: Conjunctivae and EOM are normal. Pupils are equal, round, and reactive to light. Right eye exhibits no discharge. Left eye exhibits no discharge. No scleral icterus.  Neck: Normal range of motion. Neck supple. No JVD present. No thyromegaly present.  Cardiovascular: Normal rate, regular rhythm, normal heart sounds and intact distal pulses.  Exam reveals no gallop and no friction rub.   No murmur heard. Pulmonary/Chest: Effort normal and breath sounds normal. No respiratory distress. He has no wheezes. He has no rales.  Abdominal: Soft. Bowel sounds are normal. He exhibits no distension and no mass. There is no tenderness.  Musculoskeletal: Normal range of motion. He exhibits no edema and no tenderness.  Lymphadenopathy:    He  has no cervical adenopathy.  Neurological: He is alert. Coordination normal.  Speech is clear and organized, movements are calculated imprecise, all 4 extremities with normal strength sensation, normal finger-nose-finger, normal heel shin bilaterally, and no truncal ataxia when sitting up in bed, no nystagmus, cranial nerves III through XII intact, deep impaired by a wide-based gait instability needing significant assistance to ambulate in and out of the room. There is no nystagmus with change in position and the patient denies vertigo.  Skin: Skin is warm and dry. No rash noted. No erythema.  Psychiatric: He has a normal mood and affect. His behavior is normal.    ED Course  Procedures (including critical care time) Labs Review Labs Reviewed  CBC WITH DIFFERENTIAL - Abnormal; Notable for the following:    Hemoglobin 12.8 (*)    All other components within normal limits  COMPREHENSIVE METABOLIC PANEL - Abnormal; Notable for the following:    Glucose, Bld 207 (*)    Alkaline Phosphatase 126 (*)    Total Bilirubin 0.2 (*)    All other components within normal limits  URINALYSIS, ROUTINE W REFLEX MICROSCOPIC - Abnormal; Notable for the following:    Specific Gravity, Urine >1.030 (*)    Glucose, UA 100 (*)    Hgb urine dipstick TRACE (*)    Protein, ur 100 (*)    All other components within normal limits  GLUCOSE, CAPILLARY - Abnormal; Notable for the following:    Glucose-Capillary 159 (*)    All other components within normal limits  I-STAT CHEM 8, ED - Abnormal; Notable for the following:    Glucose, Bld 197 (*)    All other components within normal limits  TROPONIN I  ETHANOL  URINE RAPID DRUG SCREEN (HOSP PERFORMED)  URINE MICROSCOPIC-ADD ON  GLUCOSE, CAPILLARY  GLUCOSE, CAPILLARY  HEMOGLOBIN A1C  LIPID PANEL    Imaging Review Dg Chest 2 View  01/26/2014   CLINICAL DATA:  COPD, stroke  EXAM: CHEST  2 VIEW  COMPARISON:  DG CHEST 2 VIEW dated 02/10/2013  FINDINGS: Elevated  right diaphragm, similar to prior study. Heart size upper normal. Vascular pattern  normal. No consolidation or edema. No pleural effusion. Stable cardiac pacer.  IMPRESSION: No active cardiopulmonary disease.   Electronically Signed   By: Skipper Cliche M.D.   On: 01/26/2014 12:06   Ct Head Wo Contrast  01/26/2014   CLINICAL DATA:  Dizziness  EXAM: CT HEAD WITHOUT CONTRAST  TECHNIQUE: Contiguous axial images were obtained from the base of the skull through the vertex without intravenous contrast.  COMPARISON:  CT HEAD W/O CM dated 03/26/2010; CT HEAD W/O CM dated 06/14/2005  FINDINGS: Calvarium is intact with no significant inflammatory change in the visualized sinuses. Mild diffuse atrophy is stable. No evidence of vascular territory infarct identified. No hemorrhage or extra-axial fluid. No hydrocephalus.  IMPRESSION: No acute intracranial abnormality.   Electronically Signed   By: Skipper Cliche M.D.   On: 01/26/2014 07:09   US Carotid Bilateral  01/26/2014   CLINICAL DATA:  Dizziness, possible stroke  EXAM: BILATERAL CAROTID DUPLEX ULTRASOUND  TECHNIQUE: Pearline Cables scale imaging, color Doppler and duplex ultrasound were performed of bilateral carotid and vertebral arteries in the neck.  COMPARISON:  Head CT 01/26/2014  FINDINGS: Criteria: Quantification of carotid stenosis is based on velocity parameters that correlate the residual internal carotid diameter with NASCET-based stenosis levels, using the diameter of the distal internal carotid lumen as the denominator for stenosis measurement.  The following velocity measurements were obtained:  RIGHT  ICA:  109/37 cm/sec  CCA:  [id number] cm/sec  SYSTOLIC ICA/CCA RATIO:  1.0  DIASTOLIC ICA/CCA RATIO:  1.9  ECA:  117 cm/sec  LEFT  ICA:  100/62 cm/sec  CCA:  [id number] cm/sec  SYSTOLIC ICA/CCA RATIO:  1.3  DIASTOLIC ICA/CCA RATIO:  1.6  ECA:  87 cm/sec  RIGHT CAROTID ARTERY: Mild heterogeneous atherosclerotic plaque in the proximal and mid internal carotid artery. By peak  systolic velocity criteria, the stenosis is estimated at less than 50%.  RIGHT VERTEBRAL ARTERY:  Patent with normal antegrade flow.  LEFT CAROTID ARTERY: Mild heterogeneous atherosclerotic plaque in the proximal and mid internal carotid artery. By peak systolic velocity criteria, the stenosis is estimated at less than 50%. [name]  LEFT VERTEBRAL ARTERY:  Patent with normal antegrade flow.  IMPRESSION: 1. Mild heterogeneous atherosclerotic plaque bilaterally results in less than 50% diameter internal carotid artery stenoses. 2. Vertebral arteries are patent with normal antegrade flow. Signed,  Criselda Peaches, MD  [facility]  [facility]   Electronically Signed   By: Jacqulynn Cadet M.D.   On: 01/26/2014 14:22    ED ECG REPORT  I personally interpreted this EKG   Date: 01/27/2014   Rate: 70  Rhythm: Electronically Paced  QRS Axis: left  Intervals: Widened QRS  ST/T Wave abnormalities: nonspecific ST/T changes  Conduction Disutrbances:left bundle branch block  Narrative Interpretation: Possible U waves  Old EKG Reviewed: unchanged   MDM   Final diagnoses:  Ataxia    The patient appears to be in a paced rhythm clinically, strong pulses, normal mentation however he does have acute ataxia, no other focal neurologic deficits, we'll pursue stroke workup, metabolic workup, cardiac workup.  The patient's ataxia and dizziness is not reproducible with the patient's position. He does appear to have a central ataxia and is unable to walk without significant assistance. CT scan of the head and laboratory workup does not show any significant findings to explain the patient's symptoms. Care was discussed with the hospitalist who agrees that the patient should be admitted to the hospital for further evaluation  of the cause of his central ataxia. This was explained to the patient he was in agreement with the plan   Johnna Acosta, MD 01/27/14 [phone]

## 2014-01-27 ENCOUNTER — Inpatient Hospital Stay (HOSPITAL_COMMUNITY): Payer: Commercial Managed Care - HMO

## 2014-01-27 DIAGNOSIS — I059 Rheumatic mitral valve disease, unspecified: Secondary | ICD-10-CM

## 2014-01-27 LAB — LIPID PANEL
CHOL/HDL RATIO: 2.3 ratio
Cholesterol: 108 mg/dL (ref 0–200)
HDL: 46 mg/dL (ref >39)
LDL Cholesterol: 42 mg/dL (ref 0–99)
TRIGLYCERIDES: 100 mg/dL (ref <150)
VLDL: 20 mg/dL (ref 0–40)

## 2014-01-27 LAB — HEMOGLOBIN A1C
Hgb A1c MFr Bld: 8.1 % — ABNORMAL HIGH (ref <5.7)
MEAN PLASMA GLUCOSE: 186 mg/dL — AB (ref <117)

## 2014-01-27 LAB — GLUCOSE, CAPILLARY
Glucose-Capillary: 120 mg/dL — ABNORMAL HIGH (ref 70–99)
Glucose-Capillary: 115 mg/dL — ABNORMAL HIGH (ref 70–99)

## 2014-01-27 NOTE — Progress Notes (Signed)
Echocardiogram 2D Echocardiogram has been performed.  Doyle Askew 01/27/2014, 8:25 AM

## 2014-01-27 NOTE — Evaluation (Addendum)
Physical Therapy Evaluation Patient Details Name: [name] MRN: [id number] DOB: 1955/12/18 Today's Date: 01/27/2014   History of Present Illness  Pt is admitted due to dizziness.  He has a hx of cardiac disease with pacemaker and COPD.  He is a diabetic with peripheral neuropathy.  He lives with his wife and is normally independent with all ADLs  Clinical Impression   Pt was seen for evaluation.  He reports that his dizziness is much improved today.  He had none in any position or with any activity.  He was on supplemental O2  Upon my arrival but O2 sat = 93% on RA.  His balance tested WNL and he had no gait ataxia.  No further PT should be needed    Follow Up Recommendations No PT follow up    Equipment Recommendations  None recommended by PT    Recommendations for Other Services  none     Precautions / Restrictions Precautions Precautions: None Restrictions Weight Bearing Restrictions: No      Mobility  Bed Mobility Overal bed mobility: Independent                Transfers Overall transfer level: Independent                  Ambulation/Gait Ambulation/Gait assistance: Independent  Pt ambulated 225' on level Assistive device: None Gait Pattern/deviations: WFL(Within Functional Limits)   Gait velocity interpretation: at or above normal speed for age/gender General Gait Details: because of peripheral neuropathy, pt walks best with shoes on  Stairs Stairs: Yes Stairs assistance: Independent Stair Management: One rail Right;Forwards Number of Stairs: 12              Balance                                 Standardized Balance Assessment Standardized Balance Assessment : Dynamic Gait Index   Dynamic Gait Index Level Surface: Normal Change in Gait Speed: Normal Gait with Horizontal Head Turns: Normal Gait with Vertical Head Turns: Normal Gait and Pivot Turn: Normal Step Over Obstacle: Normal Step Around Obstacles:  Normal Steps: Normal Total Score: 24            Home Living Family/patient expects to be discharged to:: Private residence Living Arrangements: Spouse/significant other Available Help at Discharge: Family;Available 24 hours/day Type of Home: House Home Access: Level entry     Home Layout: One level Home Equipment: None      Prior Function Level of Independence: Independent                       Extremity/Trunk Assessment   Upper Extremity Assessment: Overall WFL for tasks assessed           Lower Extremity Assessment: Overall WFL for tasks assessed;RLE deficits/detail;LLE deficits/detail         Communication   Communication: No difficulties  Cognition Arousal/Alertness: Awake/alert Behavior During Therapy: WFL for tasks assessed/performed Overall Cognitive Status: Within Functional Limits for tasks assessed                                    Assessment/Plan    PT Assessment Patent does not need any further PT services  PT Diagnosis     PT Problem List    PT Treatment Interventions  PT Goals (Current goals can be found in the Care Plan section) Acute Rehab PT Goals PT Goal Formulation: No goals set, d/c therapy         Barriers to discharge  none                     End of Session Equipment Utilized During Treatment: Gait belt Activity Tolerance: Patient tolerated treatment well Patient left: in bed Nurse Communication: Mobility status         Time: 9735-3299 PT Time Calculation (min): 32 min   Charges:   PT Evaluation $Initial PT Evaluation Tier I: 1 Procedure     PT G Codes:  24268341 ci [id number] ci [id number] ci Edited by Hazeline Junker department supervisor due to staff being out of office         Sable Feil 01/27/2014, 11:22 AM

## 2014-01-27 NOTE — Progress Notes (Signed)
Patient leaving against medical advice, staff has informed patient of all consequences of leaving against Dr.'s advice, AMA paperwork signed by patient.

## 2014-01-27 NOTE — Progress Notes (Signed)
Subjective: Patient was admitted yesterday due dizziness. He was found to have reproducible ataxia. worrisome for posterior circulation CVA. MRI could not be done due to his pacemaker. Patient insists to be discharged today.  However, he needs cardiology and neurology evaluation.  Objective: Vital signs in last 24 hours: Temp:  [97.5 F (36.4 C)-98 F (36.7 C)] 98 F (36.7 C) (04/26 1452) Pulse Rate:  [70] 70 (04/26 1452) Resp:  [20] 20 (04/26 1452) BP: (136-147)/(63-76) 136/74 mmHg (04/26 1452) SpO2:  [93 %-98 %] 96 % (04/26 1452) Weight:  [81 kg (178 lb 9.2 oz)] 81 kg (178 lb 9.2 oz) (04/26 0441) Weight change: 5.189 kg (11 lb 7 oz) Last BM Date: 01/25/14  Intake/Output from previous day: 04/25 0701 - 04/26 0700 In: 480 [P.O.:480] Out: 2100 [Urine:2100]  PHYSICAL EXAM General appearance: alert and no distress Resp: clear to auscultation bilaterally Cardio: S1, S2 normal GI: soft, non-tender; bowel sounds normal; no masses,  no organomegaly Extremities: extremities normal, atraumatic, no cyanosis or edema  Lab Results:    @labtest @ [facility]  Recent Labs  01/26/14 0650  TCO2 28   CULTURES No results found for this or any previous visit (from the past 240 hour(s)). Studies/Results: Dg Chest 2 View  01/26/2014   CLINICAL DATA:  COPD, stroke  EXAM: CHEST  2 VIEW  COMPARISON:  DG CHEST 2 VIEW dated 02/10/2013  FINDINGS: Elevated right diaphragm, similar to prior study. Heart size upper normal. Vascular pattern normal. No consolidation or edema. No pleural effusion. Stable cardiac pacer.  IMPRESSION: No active cardiopulmonary disease.   Electronically Signed   By: Skipper Cliche M.D.   On: 01/26/2014 12:06   Ct Head Wo Contrast  01/27/2014   CLINICAL DATA:  Followup stroke.  Dizziness.  EXAM: CT HEAD WITHOUT CONTRAST  TECHNIQUE: Contiguous axial images were obtained from the base of the skull through the vertex without intravenous contrast.  COMPARISON:  01/26/2014.   03/26/2010.  FINDINGS: The brain does not show accelerated atrophy. There is no sign of old or acute focal infarction, mass lesion, hemorrhage, hydrocephalus or extra-axial collection. The calvarium is unremarkable. The sinuses are clear. Minor calcification is present affecting the major vessels at the base of the brain.  IMPRESSION: No sign of acute infarction. Normal appearance of the brain except for atherosclerotic calcification of the major vessels at the base of the brain.   Electronically Signed   By: Nelson Chimes M.D.   On: 01/27/2014 09:21   Ct Head Wo Contrast  01/26/2014   CLINICAL DATA:  Dizziness  EXAM: CT HEAD WITHOUT CONTRAST  TECHNIQUE: Contiguous axial images were obtained from the base of the skull through the vertex without intravenous contrast.  COMPARISON:  CT HEAD W/O CM dated 03/26/2010; CT HEAD W/O CM dated 06/14/2005  FINDINGS: Calvarium is intact with no significant inflammatory change in the visualized sinuses. Mild diffuse atrophy is stable. No evidence of vascular territory infarct identified. No hemorrhage or extra-axial fluid. No hydrocephalus.  IMPRESSION: No acute intracranial abnormality.   Electronically Signed   By: Skipper Cliche M.D.   On: 01/26/2014 07:09   US Carotid Bilateral  01/26/2014   CLINICAL DATA:  Dizziness, possible stroke  EXAM: BILATERAL CAROTID DUPLEX ULTRASOUND  TECHNIQUE: Pearline Cables scale imaging, color Doppler and duplex ultrasound were performed of bilateral carotid and vertebral arteries in the neck.  COMPARISON:  Head CT 01/26/2014  FINDINGS: Criteria: Quantification of carotid stenosis is based on velocity parameters that correlate the residual internal carotid diameter  with NASCET-based stenosis levels, using the diameter of the distal internal carotid lumen as the denominator for stenosis measurement.  The following velocity measurements were obtained:  RIGHT  ICA:  109/37 cm/sec  CCA:  607/37 cm/sec  SYSTOLIC ICA/CCA RATIO:  1.0  DIASTOLIC ICA/CCA RATIO:   1.9  ECA:  117 cm/sec  LEFT  ICA:  100/62 cm/sec  CCA:  10/62 cm/sec  SYSTOLIC ICA/CCA RATIO:  1.3  DIASTOLIC ICA/CCA RATIO:  1.6  ECA:  87 cm/sec  RIGHT CAROTID ARTERY: Mild heterogeneous atherosclerotic plaque in the proximal and mid internal carotid artery. By peak systolic velocity criteria, the stenosis is estimated at less than 50%.  RIGHT VERTEBRAL ARTERY:  Patent with normal antegrade flow.  LEFT CAROTID ARTERY: Mild heterogeneous atherosclerotic plaque in the proximal and mid internal carotid artery. By peak systolic velocity criteria, the stenosis is estimated at less than 50%. [name]  LEFT VERTEBRAL ARTERY:  Patent with normal antegrade flow.  IMPRESSION: 1. Mild heterogeneous atherosclerotic plaque bilaterally results in less than 50% diameter internal carotid artery stenoses. 2. Vertebral arteries are patent with normal antegrade flow. Signed,  Criselda Peaches, MD  [facility]  [facility]   Electronically Signed   By: Jacqulynn Cadet M.D.   On: 01/26/2014 14:22    Medications: I have reviewed the patient's current medications.  Assesment: Active Problems:   COPD (chronic obstructive pulmonary disease)   Arteriosclerotic cardiovascular disease (ASCVD)   Diabetes mellitus, type II   Hypertension   Tobacco abuse   Pacemaker   Dizziness   Chronic systolic CHF (congestive heart failure)   Ataxia    Plan: Medications reviewed Continue telemetry Will do cardiology and neurology consult.    LOS: 1 day   Josph Norfleet 01/27/2014, 4:56 PM

## 2014-01-28 LAB — GLUCOSE, CAPILLARY
Glucose-Capillary: 49 mg/dL — ABNORMAL LOW (ref 70–99)
Glucose-Capillary: 105 mg/dL — ABNORMAL HIGH (ref 70–99)

## 2014-02-04 ENCOUNTER — Encounter: Payer: Self-pay | Admitting: Internal Medicine

## 2014-02-04 ENCOUNTER — Ambulatory Visit (INDEPENDENT_AMBULATORY_CARE_PROVIDER_SITE_OTHER): Payer: Medicare HMO | Admitting: Internal Medicine

## 2014-02-04 VITALS — BP 130/69 | HR 70 | Ht 72.0 in | Wt 175.1 lb

## 2014-02-04 DIAGNOSIS — I4891 Unspecified atrial fibrillation: Secondary | ICD-10-CM

## 2014-02-04 DIAGNOSIS — I5022 Chronic systolic (congestive) heart failure: Secondary | ICD-10-CM

## 2014-02-04 DIAGNOSIS — I509 Heart failure, unspecified: Secondary | ICD-10-CM

## 2014-02-04 DIAGNOSIS — Z95810 Presence of automatic (implantable) cardiac defibrillator: Secondary | ICD-10-CM

## 2014-02-04 LAB — MDC_IDC_ENUM_SESS_TYPE_INCLINIC
HIGH POWER IMPEDANCE MEASURED VALUE: 53 Ohm
HighPow Impedance: 79 Ohm
Lead Channel Impedance Value: 637 Ohm
Lead Channel Impedance Value: 697 Ohm
Lead Channel Pacing Threshold Amplitude: 0.6 V
Lead Channel Pacing Threshold Pulse Width: 0.4 ms
Lead Channel Sensing Intrinsic Amplitude: 25 mV
Lead Channel Sensing Intrinsic Amplitude: 25 mV
Lead Channel Sensing Intrinsic Amplitude: 7.7 mV
Lead Channel Setting Pacing Pulse Width: 0.4 ms
Lead Channel Setting Pacing Pulse Width: 0.4 ms
Lead Channel Setting Sensing Sensitivity: 0.6 mV
Lead Channel Setting Sensing Sensitivity: 1 mV
MDC IDC MSMT LEADCHNL LV IMPEDANCE VALUE: 858 Ohm
MDC IDC MSMT LEADCHNL LV PACING THRESHOLD AMPLITUDE: 1.7 V
MDC IDC MSMT LEADCHNL LV PACING THRESHOLD PULSEWIDTH: 0.4 ms
MDC IDC PG SERIAL: 950230
MDC IDC SESS DTM: 20150504040000
MDC IDC SET LEADCHNL LV PACING AMPLITUDE: 3 V
MDC IDC SET LEADCHNL RA PACING AMPLITUDE: 3.5 V
MDC IDC SET LEADCHNL RV PACING AMPLITUDE: 2.5 V
MDC IDC STAT BRADY RA PERCENT PACED: 1 %
MDC IDC STAT BRADY RV PERCENT PACED: 100 %
Zone Setting Detection Interval: 286 ms
Zone Setting Detection Interval: 333 ms

## 2014-02-04 NOTE — Assessment & Plan Note (Signed)
He has both fibrillation and flutter and is asymptomatic. I discussed the anti-coagulation treatment options with the patient and he would like to start warfarin. He will take plavix 2 days after starting warfaring and then stop plavix and continue ASA. He will followup in our coumadin clinic.

## 2014-02-04 NOTE — Patient Instructions (Signed)
Your physician recommends that you schedule a follow-up appointment in: 12 months with [facility]  Cou[facility]in clinic next week.   Once starting Coumdin take Plavix for 2 days, on the 3rd day please stop Plavix. Continue to take Asprin 81 mg daily.

## 2014-02-04 NOTE — Assessment & Plan Note (Signed)
His device is working normally. Will recheck in several months. 

## 2014-02-04 NOTE — Assessment & Plan Note (Signed)
His symptoms remain class 2. He will continue his current meds. 

## 2014-02-04 NOTE — Progress Notes (Signed)
HPI Mr. Risse returns today for followup. He is a pleasant 58 yo man with an ICM, CHB, s/p BiV ICD, who has developed atrial fib/flutter in the interim. He is asymptomatic. He denies chest pain or sob. No edema or palpitations.  Allergies  Allergen Reactions  . Vancomycin Itching and Rash     Current Outpatient Prescriptions  Medication Sig Dispense Refill  . acetaminophen (TYLENOL) 500 MG tablet Take 1 tablet by mouth as needed.      [name] albuterol (PROVENTIL HFA;VENTOLIN HFA) 108 (90 BASE) MCG/ACT inhaler Inhale 2 puffs into the lungs every 6 (six) hours as needed. For shortness of breath      . albuterol (PROVENTIL) (2.5 MG/3ML) 0.083% nebulizer solution Take 2.5 mg by nebulization every 6 (six) hours as needed. For shortness of breath      . aspirin EC 81 MG tablet Take 1 tablet by mouth daily.      . carvedilol (COREG) 6.25 MG tablet Take 6.25 mg by mouth 2 (two) times daily with a meal.       . clopidogrel (PLAVIX) 75 MG tablet Take 75 mg by mouth daily.       . furosemide (LASIX) 40 MG tablet Take 40 mg by mouth daily.      [name] gabapentin (NEURONTIN) 300 MG capsule Take 300 mg by mouth at bedtime.      [name] ibuprofen (ADVIL,MOTRIN) 200 MG tablet Take 1 tablet by mouth as needed.      . insulin glargine (LANTUS) 100 UNIT/ML injection Inject 20 Units into the skin 2 (two) times daily.       [name] lisinopril (PRINIVIL) 10 MG tablet Take 1 tablet (10 mg total) by mouth daily.  30 tablet  12  . magnesium oxide (MAG-OX) 400 MG tablet Take 400 mg by mouth 2 (two) times daily.      . metFORMIN (GLUCOPHAGE) 500 MG tablet Take 1,000 mg by mouth 2 (two) times daily with a meal.      . nitroGLYCERIN (NITROSTAT) 0.4 MG SL tablet Place 0.4 mg under the tongue every 5 (five) minutes x 3 doses as needed. For chest pain      . sertraline (ZOLOFT) 50 MG tablet Take 50 mg by mouth daily.      . simvastatin (ZOCOR) 80 MG tablet Take 40 mg by mouth at bedtime.      [name] tiZANidine (ZANAFLEX) 4 MG tablet Take  4 mg by mouth 3 (three) times daily.       No current facility-administered medications for this visit.     Past Medical History  Diagnosis Date  . COPD (chronic obstructive pulmonary disease)   . Arteriosclerotic cardiovascular disease (ASCVD)     Acute MI in 2006->  LAD stent, EF-37%  . Diabetes mellitus, type II     With peripheral neuropathy  . Colon cancer 2006    Partial colectomy in 2006  . Hypertension   . Hyperlipidemia   . Carpal tunnel syndrome   . Tobacco abuse   . Edema     venous insufficiency  . Anemia, normocytic normochromic 2008    2008  . Pneumonia 01/2012    01/2012; subsequent admission for chest pain  . H/O alcohol dependence   . Complete heart block     S/P ICD 02/09/13  . Coronary artery disease   . Myocardial infarction     Hx: of 2006  . Pacemaker   . Automatic implantable cardioverter-defibrillator in situ   .  Shortness of breath     Hx: of   . Neuropathy in diabetes     Hx: of  . H/O hiatal hernia   . Arthritis     Hx: of in hands    ROS:   All systems reviewed and negative except as noted in the HPI.   Past Surgical History  Procedure Laterality Date  . Subtotal colectomy  09/01/2005    subtotal colectomy  . Coronary angioplasty with stent placement    . Ankle surgery      Right  . Inguinal hernia repair    . Colonoscopy  08/17/2005    Pancolonic diverticula/Multiple rectal polyps removed as described above. The larger of the two likely responsible for intermittent hematochezia/ Multiple polyps at the hepatic flexure resected with a snare./ Large polypoid lesion growing out of the base of the cecum, just behind the  ileocecal valve. This was not felt to be amendable to endoscopic resection. Biopsied multiple times  . Flexible sigmoidoscopy    06/13/2006    Essentially normal residual rectum status post total colectomy. anastomosis at 25cm.  Focal area of abnormality adjacent to suture, as described above, likely not significant,  suspect granulation tissue, biopsied.  Anastomosis otherwise appeared normal.  Distal terminal ileum appeared normal  . Biventricular icd placement  02/09/2013    Dr. Lovena Le  . Multiple extractions with alveoloplasty N/A 04/09/2013    Procedure: Extraction of tooth #'s 1,2,4,5,6,7,8,9,10,11,12,13,17,18,20,21,22,23,27,28, 29,30,31, and 32 with alveoloplasty.;  Surgeon: Lenn Cal, DDS;  Location: [facility];  Service: Oral Surgery;  Laterality: N/A;     Family History  Problem Relation Age of Onset  . Colon cancer Mother     Valdemar Mcclenahan, ?>age 34.  [name] Heart disease Mother   . Heart disease Father   . Liver disease Neg Hx   . Hypertension Brother   . Heart disease Brother   . Diabetes Brother      History   Social History  . Marital Status: Married    Spouse Name: N/A    Number of Children: 1  . Years of Education: N/A   Occupational History  . Parts Manager     Disabled   Social History Main Topics  . Smoking status: Current Every Day Smoker -- 1.00 packs/day for 40 years    Types: Cigarettes  . Smokeless tobacco: Never Used  . Alcohol Use: No     Comment: Quit 2005 from a 6 pack a day use  . Drug Use: No  . Sexual Activity: No   Other Topics Concern  . Not on file   Social History Narrative  . No narrative on file     BP 130/69  Pulse 70  Ht 6' (1.829 m)  Wt 175 lb 1.9 oz (79.434 kg)  BMI 23.75 kg/m2  Physical Exam:  Well appearing NAD HEENT: Unremarkable Neck:  No JVD, no thyromegally Lymphatics:  No adenopathy Back:  No CVA tenderness Lungs:  Clear with no wheezes HEART:  Regular rate rhythm, no murmurs, no rubs, no clicks Abd:  soft, positive bowel sounds, no organomegally, no rebound, no guarding Ext:  2 plus pulses, no edema, no cyanosis, no clubbing Skin:  No rashes no nodules Neuro:  CN II through XII intact, motor grossly intact   DEVICE  Normal device function.  See PaceArt for details. Underlying rhythm is atrial  flutter.  Assess/Plan:

## 2014-02-11 ENCOUNTER — Ambulatory Visit (INDEPENDENT_AMBULATORY_CARE_PROVIDER_SITE_OTHER): Payer: Commercial Managed Care - HMO | Admitting: *Deleted

## 2014-02-11 DIAGNOSIS — I4891 Unspecified atrial fibrillation: Secondary | ICD-10-CM

## 2014-02-11 DIAGNOSIS — Z5181 Encounter for therapeutic drug level monitoring: Secondary | ICD-10-CM

## 2014-02-11 LAB — POCT INR: INR: 1.1

## 2014-02-11 MED ORDER — WARFARIN SODIUM 4 MG PO TABS: 4.0000 mg | ORAL_TABLET | Freq: Every day | ORAL | Status: DC

## 2014-02-13 NOTE — Discharge Summary (Signed)
Physician Discharge Summary  Patient ID: [name] MRN: [id number] DOB/AGE: May 19, 1956 58 y.o. Primary Care Physician:[name],[name], MD Admit date: 01/26/2014 Discharge date: 02/13/2014    Discharge Diagnoses:   Active Problems:   COPD (chronic obstructive pulmonary disease)   Arteriosclerotic cardiovascular disease (ASCVD)   Diabetes mellitus, type II   Hypertension   Tobacco abuse   Biventricular implantable cardioverter-defibrillator in situ   Dizziness   Chronic systolic CHF (congestive heart failure)   Ataxia     Medication List    ASK your doctor about these medications       albuterol (2.5 MG/3ML) 0.083% nebulizer solution  Commonly known as:  PROVENTIL  Take 2.5 mg by nebulization every 6 (six) hours as needed. For shortness of breath     albuterol 108 (90 BASE) MCG/ACT inhaler  Commonly known as:  PROVENTIL HFA;VENTOLIN HFA  Inhale 2 puffs into the lungs every 6 (six) hours as needed. For shortness of breath     carvedilol 6.25 MG tablet  Commonly known as:  COREG  Take 6.25 mg by mouth 2 (two) times daily with a meal.     clopidogrel 75 MG tablet  Commonly known as:  PLAVIX  Take 75 mg by mouth daily.     furosemide 40 MG tablet  Commonly known as:  LASIX  Take 40 mg by mouth daily.     gabapentin 300 MG capsule  Commonly known as:  NEURONTIN  Take 300 mg by mouth at bedtime.     insulin glargine 100 UNIT/ML injection  Commonly known as:  LANTUS  Inject 20 Units into the skin 2 (two) times daily.     lisinopril 10 MG tablet  Commonly known as:  PRINIVIL  Take 1 tablet (10 mg total) by mouth daily.     magnesium oxide 400 MG tablet  Commonly known as:  MAG-OX  Take 400 mg by mouth 2 (two) times daily.     metFORMIN 500 MG tablet  Commonly known as:  GLUCOPHAGE  Take 1,000 mg by mouth 2 (two) times daily with a meal.     nitroGLYCERIN 0.4 MG SL tablet  Commonly known as:  NITROSTAT  Place 0.4 mg under the tongue every 5 (five)  minutes x 3 doses as needed. For chest pain     sertraline 50 MG tablet  Commonly known as:  ZOLOFT  Take 50 mg by mouth daily.     simvastatin 80 MG tablet  Commonly known as:  ZOCOR  Take 40 mg by mouth at bedtime.     tiZANidine 4 MG tablet  Commonly known as:  ZANAFLEX  Take 4 mg by mouth 3 (three) times daily.        Discharged Condition: stable    Consults: none  Significant Diagnostic Studies: Dg Chest 2 View  01/26/2014   CLINICAL DATA:  COPD, stroke  EXAM: CHEST  2 VIEW  COMPARISON:  DG CHEST 2 VIEW dated 02/10/2013  FINDINGS: Elevated right diaphragm, similar to prior study. Heart size upper normal. Vascular pattern normal. No consolidation or edema. No pleural effusion. Stable cardiac pacer.  IMPRESSION: No active cardiopulmonary disease.   Electronically Signed   By: Skipper Cliche M.D.   On: 01/26/2014 12:06   Ct Head Wo Contrast  01/27/2014   CLINICAL DATA:  Followup stroke.  Dizziness.  EXAM: CT HEAD WITHOUT CONTRAST  TECHNIQUE: Contiguous axial images were obtained from the base of the skull through the vertex without intravenous contrast.  COMPARISON:  01/26/2014.  03/26/2010.  FINDINGS: The brain does not show accelerated atrophy. There is no sign of old or acute focal infarction, mass lesion, hemorrhage, hydrocephalus or extra-axial collection. The calvarium is unremarkable. The sinuses are clear. Minor calcification is present affecting the major vessels at the base of the brain.  IMPRESSION: No sign of acute infarction. Normal appearance of the brain except for atherosclerotic calcification of the major vessels at the base of the brain.   Electronically Signed   By: Nelson Chimes M.D.   On: 01/27/2014 09:21   Ct Head Wo Contrast  01/26/2014   CLINICAL DATA:  Dizziness  EXAM: CT HEAD WITHOUT CONTRAST  TECHNIQUE: Contiguous axial images were obtained from the base of the skull through the vertex without intravenous contrast.  COMPARISON:  CT HEAD W/O CM dated  03/26/2010; CT HEAD W/O CM dated 06/14/2005  FINDINGS: Calvarium is intact with no significant inflammatory change in the visualized sinuses. Mild diffuse atrophy is stable. No evidence of vascular territory infarct identified. No hemorrhage or extra-axial fluid. No hydrocephalus.  IMPRESSION: No acute intracranial abnormality.   Electronically Signed   By: Skipper Cliche M.D.   On: 01/26/2014 07:09   US Carotid Bilateral  01/26/2014   CLINICAL DATA:  Dizziness, possible stroke  EXAM: BILATERAL CAROTID DUPLEX ULTRASOUND  TECHNIQUE: Pearline Cables scale imaging, color Doppler and duplex ultrasound were performed of bilateral carotid and vertebral arteries in the neck.  COMPARISON:  Head CT 01/26/2014  FINDINGS: Criteria: Quantification of carotid stenosis is based on velocity parameters that correlate the residual internal carotid diameter with NASCET-based stenosis levels, using the diameter of the distal internal carotid lumen as the denominator for stenosis measurement.  The following velocity measurements were obtained:  RIGHT  ICA:  109/37 cm/sec  CCA:  578/46 cm/sec  SYSTOLIC ICA/CCA RATIO:  1.0  DIASTOLIC ICA/CCA RATIO:  1.9  ECA:  117 cm/sec  LEFT  ICA:  100/62 cm/sec  CCA:  96/29 cm/sec  SYSTOLIC ICA/CCA RATIO:  1.3  DIASTOLIC ICA/CCA RATIO:  1.6  ECA:  87 cm/sec  RIGHT CAROTID ARTERY: Mild heterogeneous atherosclerotic plaque in the proximal and mid internal carotid artery. By peak systolic velocity criteria, the stenosis is estimated at less than 50%.  RIGHT VERTEBRAL ARTERY:  Patent with normal antegrade flow.  LEFT CAROTID ARTERY: Mild heterogeneous atherosclerotic plaque in the proximal and mid internal carotid artery. By peak systolic velocity criteria, the stenosis is estimated at less than 50%. [name]  LEFT VERTEBRAL ARTERY:  Patent with normal antegrade flow.  IMPRESSION: 1. Mild heterogeneous atherosclerotic plaque bilaterally results in less than 50% diameter internal carotid artery stenoses. 2. Vertebral  arteries are patent with normal antegrade flow. Signed,  Criselda Peaches, MD  [facility]  [facility]   Electronically Signed   By: Jacqulynn Cadet M.D.   On: 01/26/2014 14:22    Lab Results: Basic Metabolic Panel: No results found for this basename: NA, K, CL, CO2, GLUCOSE, BUN, CREATININE, CALCIUM, MG, PHOS,  in the last 72 hours Liver Function Tests: No results found for this basename: AST, ALT, ALKPHOS, BILITOT, PROT, ALBUMIN,  in the last 72 hours   CBC: No results found for this basename: WBC, NEUTROABS, HGB, HCT, MCV, PLT,  in the last 72 hours  No results found for this or any previous visit (from the past 240 hour(s)).   Hospital Course:  Patient was admittedy due dizziness. He was found to have reproducible ataxia. worrisome for posterior circulation  CVA. MRI could not be done due to his pacemaker. Patient insists to be discharged today. However, he needs cardiology and neurology evaluation. Patient left against medical advise.      Discharge Exam: Blood pressure 136/74, pulse 70, temperature 98 F (36.7 C), temperature source Oral, resp. rate 20, height 6' (1.829 m), weight 81 kg (178 lb 9.2 oz), SpO2 96.00%.   Disposition:  home      Signed: Rosita Fire   02/13/2014, 1:41 PM

## 2014-02-14 NOTE — Care Management Utilization Note (Signed)
UR completed 

## 2014-02-27 ENCOUNTER — Telehealth: Payer: Self-pay | Admitting: *Deleted

## 2014-02-27 NOTE — Telephone Encounter (Signed)
Pt received warfarin from [facility].  He will start warfarin 4mg  daily at 6pm today.  He will continue plavix today and tomorrow then discontinue per Dr Lovena Le.  INR appt made for 03/06/14.  Pt verbalized understanding.

## 2014-02-27 NOTE — Telephone Encounter (Signed)
Pt got his new medication in the mail and was told to call lisa when it come in.

## 2014-03-06 ENCOUNTER — Ambulatory Visit (INDEPENDENT_AMBULATORY_CARE_PROVIDER_SITE_OTHER): Payer: Medicare HMO | Admitting: *Deleted

## 2014-03-06 DIAGNOSIS — Z5181 Encounter for therapeutic drug level monitoring: Secondary | ICD-10-CM

## 2014-03-06 DIAGNOSIS — I4891 Unspecified atrial fibrillation: Secondary | ICD-10-CM

## 2014-03-06 LAB — POCT INR: INR: 1.5

## 2014-03-18 ENCOUNTER — Ambulatory Visit (INDEPENDENT_AMBULATORY_CARE_PROVIDER_SITE_OTHER): Payer: Medicare HMO | Admitting: *Deleted

## 2014-03-18 DIAGNOSIS — Z5181 Encounter for therapeutic drug level monitoring: Secondary | ICD-10-CM

## 2014-03-18 DIAGNOSIS — I4891 Unspecified atrial fibrillation: Secondary | ICD-10-CM

## 2014-03-18 LAB — POCT INR: INR: 1.6

## 2014-03-25 ENCOUNTER — Ambulatory Visit (INDEPENDENT_AMBULATORY_CARE_PROVIDER_SITE_OTHER): Payer: Medicare HMO | Admitting: *Deleted

## 2014-03-25 DIAGNOSIS — Z5181 Encounter for therapeutic drug level monitoring: Secondary | ICD-10-CM

## 2014-03-25 DIAGNOSIS — I4891 Unspecified atrial fibrillation: Secondary | ICD-10-CM

## 2014-03-25 LAB — POCT INR: INR: 2.1

## 2014-04-03 ENCOUNTER — Ambulatory Visit (INDEPENDENT_AMBULATORY_CARE_PROVIDER_SITE_OTHER): Payer: Commercial Managed Care - HMO | Admitting: *Deleted

## 2014-04-03 DIAGNOSIS — I4891 Unspecified atrial fibrillation: Secondary | ICD-10-CM

## 2014-04-03 DIAGNOSIS — Z5181 Encounter for therapeutic drug level monitoring: Secondary | ICD-10-CM

## 2014-04-03 LAB — POCT INR: INR: 1.8

## 2014-04-15 ENCOUNTER — Ambulatory Visit (INDEPENDENT_AMBULATORY_CARE_PROVIDER_SITE_OTHER): Payer: Medicare HMO | Admitting: *Deleted

## 2014-04-15 DIAGNOSIS — Z5181 Encounter for therapeutic drug level monitoring: Secondary | ICD-10-CM

## 2014-04-15 DIAGNOSIS — I4891 Unspecified atrial fibrillation: Secondary | ICD-10-CM

## 2014-04-15 LAB — POCT INR: INR: 2

## 2014-04-29 ENCOUNTER — Ambulatory Visit (INDEPENDENT_AMBULATORY_CARE_PROVIDER_SITE_OTHER): Payer: Medicare HMO | Admitting: *Deleted

## 2014-04-29 DIAGNOSIS — I4891 Unspecified atrial fibrillation: Secondary | ICD-10-CM

## 2014-04-29 DIAGNOSIS — Z5181 Encounter for therapeutic drug level monitoring: Secondary | ICD-10-CM

## 2014-04-29 LAB — POCT INR: INR: 3.3

## 2014-05-13 ENCOUNTER — Ambulatory Visit (INDEPENDENT_AMBULATORY_CARE_PROVIDER_SITE_OTHER): Payer: Commercial Managed Care - HMO | Admitting: *Deleted

## 2014-05-13 DIAGNOSIS — I4891 Unspecified atrial fibrillation: Secondary | ICD-10-CM | POA: Diagnosis not present

## 2014-05-13 DIAGNOSIS — Z5181 Encounter for therapeutic drug level monitoring: Secondary | ICD-10-CM | POA: Diagnosis not present

## 2014-05-13 LAB — POCT INR: INR: 1.7

## 2014-05-17 ENCOUNTER — Encounter: Payer: Self-pay | Admitting: *Deleted

## 2014-05-27 ENCOUNTER — Ambulatory Visit (INDEPENDENT_AMBULATORY_CARE_PROVIDER_SITE_OTHER): Payer: Commercial Managed Care - HMO | Admitting: *Deleted

## 2014-05-27 DIAGNOSIS — I4891 Unspecified atrial fibrillation: Secondary | ICD-10-CM

## 2014-05-27 DIAGNOSIS — Z5181 Encounter for therapeutic drug level monitoring: Secondary | ICD-10-CM

## 2014-05-27 LAB — POCT INR: INR: 2

## 2014-06-17 ENCOUNTER — Telehealth: Payer: Self-pay | Admitting: *Deleted

## 2014-06-17 MED ORDER — WARFARIN SODIUM 4 MG PO TABS: ORAL_TABLET | ORAL | Status: DC

## 2014-06-17 NOTE — Telephone Encounter (Signed)
Patient wants return phone call regarding his Warfarin. / tgs

## 2014-06-17 NOTE — Telephone Encounter (Signed)
Needs new Rx for warfarin sent in with higher quantity of pills since dose has been increased.

## 2014-06-24 ENCOUNTER — Ambulatory Visit (INDEPENDENT_AMBULATORY_CARE_PROVIDER_SITE_OTHER): Payer: Commercial Managed Care - HMO | Admitting: *Deleted

## 2014-06-24 DIAGNOSIS — I4891 Unspecified atrial fibrillation: Secondary | ICD-10-CM

## 2014-06-24 DIAGNOSIS — Z5181 Encounter for therapeutic drug level monitoring: Secondary | ICD-10-CM

## 2014-06-24 LAB — POCT INR: INR: 1.9

## 2014-06-28 ENCOUNTER — Encounter: Payer: Self-pay | Admitting: *Deleted

## 2014-07-17 ENCOUNTER — Ambulatory Visit (INDEPENDENT_AMBULATORY_CARE_PROVIDER_SITE_OTHER): Payer: Commercial Managed Care - HMO | Admitting: *Deleted

## 2014-07-17 DIAGNOSIS — I4891 Unspecified atrial fibrillation: Secondary | ICD-10-CM

## 2014-07-17 DIAGNOSIS — Z5181 Encounter for therapeutic drug level monitoring: Secondary | ICD-10-CM

## 2014-07-17 LAB — POCT INR: INR: 2.7

## 2014-08-09 ENCOUNTER — Ambulatory Visit (INDEPENDENT_AMBULATORY_CARE_PROVIDER_SITE_OTHER): Payer: Commercial Managed Care - HMO | Admitting: *Deleted

## 2014-08-09 ENCOUNTER — Encounter: Payer: Self-pay | Admitting: Internal Medicine

## 2014-08-09 DIAGNOSIS — I5022 Chronic systolic (congestive) heart failure: Secondary | ICD-10-CM

## 2014-08-09 LAB — MDC_IDC_ENUM_SESS_TYPE_INCLINIC
Brady Statistic RV Percent Paced: 100 %
Date Time Interrogation Session: 20151106050000
HighPow Impedance: 53 Ohm
HighPow Impedance: 88 Ohm
Lead Channel Impedance Value: 667 Ohm
Lead Channel Impedance Value: 853 Ohm
Lead Channel Pacing Threshold Amplitude: 0.5 V
Lead Channel Pacing Threshold Pulse Width: 0.4 ms
Lead Channel Setting Pacing Amplitude: 2.5 V
Lead Channel Setting Pacing Amplitude: 2.5 V
Lead Channel Setting Pacing Amplitude: 3.5 V
Lead Channel Setting Pacing Pulse Width: 0.4 ms
Lead Channel Setting Pacing Pulse Width: 0.4 ms
MDC IDC MSMT LEADCHNL LV PACING THRESHOLD AMPLITUDE: 0.9 V
MDC IDC MSMT LEADCHNL RA SENSING INTR AMPL: 7.2 mV
MDC IDC MSMT LEADCHNL RV IMPEDANCE VALUE: 718 Ohm
MDC IDC MSMT LEADCHNL RV PACING THRESHOLD PULSEWIDTH: 0.4 ms
MDC IDC PG SERIAL: 950230
MDC IDC SET LEADCHNL LV SENSING SENSITIVITY: 1 mV
MDC IDC SET LEADCHNL RV SENSING SENSITIVITY: 0.6 mV
MDC IDC SET ZONE DETECTION INTERVAL: 286 ms
MDC IDC SET ZONE DETECTION INTERVAL: 333 ms

## 2014-08-09 NOTE — Progress Notes (Signed)
Bi V ICD check in office. 

## 2014-08-14 ENCOUNTER — Ambulatory Visit (INDEPENDENT_AMBULATORY_CARE_PROVIDER_SITE_OTHER): Payer: Commercial Managed Care - HMO | Admitting: *Deleted

## 2014-08-14 DIAGNOSIS — Z5181 Encounter for therapeutic drug level monitoring: Secondary | ICD-10-CM

## 2014-08-14 DIAGNOSIS — I4891 Unspecified atrial fibrillation: Secondary | ICD-10-CM

## 2014-08-14 LAB — POCT INR: INR: 2.3

## 2014-09-11 ENCOUNTER — Ambulatory Visit (INDEPENDENT_AMBULATORY_CARE_PROVIDER_SITE_OTHER): Payer: Commercial Managed Care - HMO | Admitting: *Deleted

## 2014-09-11 DIAGNOSIS — Z5181 Encounter for therapeutic drug level monitoring: Secondary | ICD-10-CM

## 2014-09-11 DIAGNOSIS — I4891 Unspecified atrial fibrillation: Secondary | ICD-10-CM

## 2014-09-11 LAB — POCT INR: INR: 1.4

## 2014-09-12 ENCOUNTER — Encounter (HOSPITAL_COMMUNITY): Payer: Self-pay | Admitting: Interventional Cardiology

## 2014-09-23 ENCOUNTER — Ambulatory Visit (INDEPENDENT_AMBULATORY_CARE_PROVIDER_SITE_OTHER): Payer: Commercial Managed Care - HMO | Admitting: *Deleted

## 2014-09-23 DIAGNOSIS — I4891 Unspecified atrial fibrillation: Secondary | ICD-10-CM

## 2014-09-23 DIAGNOSIS — Z5181 Encounter for therapeutic drug level monitoring: Secondary | ICD-10-CM

## 2014-09-23 LAB — POCT INR: INR: 3.4

## 2014-10-14 ENCOUNTER — Ambulatory Visit (INDEPENDENT_AMBULATORY_CARE_PROVIDER_SITE_OTHER): Payer: Commercial Managed Care - HMO | Admitting: *Deleted

## 2014-10-14 DIAGNOSIS — I4891 Unspecified atrial fibrillation: Secondary | ICD-10-CM

## 2014-10-14 DIAGNOSIS — Z5181 Encounter for therapeutic drug level monitoring: Secondary | ICD-10-CM | POA: Diagnosis not present

## 2014-10-14 LAB — POCT INR: INR: 2

## 2014-11-11 ENCOUNTER — Ambulatory Visit (INDEPENDENT_AMBULATORY_CARE_PROVIDER_SITE_OTHER): Payer: Commercial Managed Care - HMO | Admitting: *Deleted

## 2014-11-11 DIAGNOSIS — G609 Hereditary and idiopathic neuropathy, unspecified: Secondary | ICD-10-CM | POA: Diagnosis not present

## 2014-11-11 DIAGNOSIS — E782 Mixed hyperlipidemia: Secondary | ICD-10-CM | POA: Diagnosis not present

## 2014-11-11 DIAGNOSIS — I509 Heart failure, unspecified: Secondary | ICD-10-CM | POA: Diagnosis not present

## 2014-11-11 DIAGNOSIS — I4891 Unspecified atrial fibrillation: Secondary | ICD-10-CM

## 2014-11-11 DIAGNOSIS — E785 Hyperlipidemia, unspecified: Secondary | ICD-10-CM | POA: Diagnosis not present

## 2014-11-11 DIAGNOSIS — I2510 Atherosclerotic heart disease of native coronary artery without angina pectoris: Secondary | ICD-10-CM | POA: Diagnosis not present

## 2014-11-11 DIAGNOSIS — F17200 Nicotine dependence, unspecified, uncomplicated: Secondary | ICD-10-CM | POA: Diagnosis not present

## 2014-11-11 DIAGNOSIS — I872 Venous insufficiency (chronic) (peripheral): Secondary | ICD-10-CM | POA: Diagnosis not present

## 2014-11-11 DIAGNOSIS — Z5181 Encounter for therapeutic drug level monitoring: Secondary | ICD-10-CM

## 2014-11-11 DIAGNOSIS — E1165 Type 2 diabetes mellitus with hyperglycemia: Secondary | ICD-10-CM | POA: Diagnosis not present

## 2014-11-11 LAB — POCT INR: INR: 1.9

## 2014-11-14 DIAGNOSIS — R109 Unspecified abdominal pain: Secondary | ICD-10-CM | POA: Diagnosis not present

## 2014-11-14 DIAGNOSIS — I2510 Atherosclerotic heart disease of native coronary artery without angina pectoris: Secondary | ICD-10-CM | POA: Diagnosis not present

## 2014-11-14 DIAGNOSIS — E1165 Type 2 diabetes mellitus with hyperglycemia: Secondary | ICD-10-CM | POA: Diagnosis not present

## 2014-11-14 DIAGNOSIS — J449 Chronic obstructive pulmonary disease, unspecified: Secondary | ICD-10-CM | POA: Diagnosis not present

## 2014-11-15 ENCOUNTER — Ambulatory Visit (INDEPENDENT_AMBULATORY_CARE_PROVIDER_SITE_OTHER): Payer: Commercial Managed Care - HMO | Admitting: *Deleted

## 2014-11-15 DIAGNOSIS — I5022 Chronic systolic (congestive) heart failure: Secondary | ICD-10-CM | POA: Diagnosis not present

## 2014-11-15 LAB — MDC_IDC_ENUM_SESS_TYPE_INCLINIC
Battery Remaining Longevity: 96 mo
Date Time Interrogation Session: 20160212050000
HIGH POWER IMPEDANCE MEASURED VALUE: 53 Ohm
HighPow Impedance: 88 Ohm
Implantable Pulse Generator Serial Number: 950230
Lead Channel Impedance Value: 682 Ohm
Lead Channel Impedance Value: 716 Ohm
Lead Channel Impedance Value: 825 Ohm
Lead Channel Pacing Threshold Amplitude: 0.6 V
Lead Channel Pacing Threshold Amplitude: 1 V
Lead Channel Pacing Threshold Pulse Width: 0.4 ms
Lead Channel Pacing Threshold Pulse Width: 0.4 ms
Lead Channel Sensing Intrinsic Amplitude: 6.5 mV
Lead Channel Setting Pacing Amplitude: 2.5 V
Lead Channel Setting Pacing Amplitude: 2.5 V
Lead Channel Setting Pacing Amplitude: 3.5 V
Lead Channel Setting Pacing Pulse Width: 0.4 ms
Lead Channel Setting Pacing Pulse Width: 0.4 ms
Lead Channel Setting Sensing Sensitivity: 0.6 mV
Lead Channel Setting Sensing Sensitivity: 1 mV
MDC IDC MSMT LEADCHNL RV PACING THRESHOLD AMPLITUDE: 0.5 V
MDC IDC MSMT LEADCHNL RV PACING THRESHOLD PULSEWIDTH: 0.4 ms
MDC IDC SET ZONE DETECTION INTERVAL: 333 ms
Zone Setting Detection Interval: 286 ms

## 2014-11-15 NOTE — Progress Notes (Signed)
Bi V ICD in office check.

## 2014-11-21 ENCOUNTER — Encounter: Payer: Self-pay | Admitting: Internal Medicine

## 2014-11-21 ENCOUNTER — Other Ambulatory Visit (HOSPITAL_COMMUNITY): Payer: Self-pay | Admitting: Pulmonary Disease

## 2014-11-21 DIAGNOSIS — R109 Unspecified abdominal pain: Secondary | ICD-10-CM

## 2014-11-21 DIAGNOSIS — C189 Malignant neoplasm of colon, unspecified: Secondary | ICD-10-CM

## 2014-11-25 ENCOUNTER — Ambulatory Visit (HOSPITAL_COMMUNITY)
Admission: RE | Admit: 2014-11-25 | Discharge: 2014-11-25 | Disposition: A | Discharge status: Home / Self Care | Payer: Commercial Managed Care - HMO | Source: Ambulatory Visit | Attending: Pulmonary Disease | Admitting: Pulmonary Disease

## 2014-11-25 DIAGNOSIS — Z85038 Personal history of other malignant neoplasm of large intestine: Secondary | ICD-10-CM | POA: Diagnosis not present

## 2014-11-25 DIAGNOSIS — K4090 Unilateral inguinal hernia, without obstruction or gangrene, not specified as recurrent: Secondary | ICD-10-CM | POA: Diagnosis not present

## 2014-11-25 DIAGNOSIS — R109 Unspecified abdominal pain: Secondary | ICD-10-CM | POA: Diagnosis not present

## 2014-11-25 DIAGNOSIS — C189 Malignant neoplasm of colon, unspecified: Secondary | ICD-10-CM | POA: Diagnosis not present

## 2014-11-25 MED ORDER — IOHEXOL 300 MG/ML  SOLN
100.0000 mL | Freq: Once | INTRAMUSCULAR | Status: AC | PRN
  Administered 2014-11-25: 100 mL via INTRAVENOUS

## 2014-12-02 ENCOUNTER — Ambulatory Visit (INDEPENDENT_AMBULATORY_CARE_PROVIDER_SITE_OTHER): Payer: Commercial Managed Care - HMO | Admitting: *Deleted

## 2014-12-02 DIAGNOSIS — I4891 Unspecified atrial fibrillation: Secondary | ICD-10-CM | POA: Diagnosis not present

## 2014-12-02 DIAGNOSIS — Z5181 Encounter for therapeutic drug level monitoring: Secondary | ICD-10-CM | POA: Diagnosis not present

## 2014-12-02 LAB — POCT INR: INR: 1.9

## 2014-12-11 ENCOUNTER — Encounter: Payer: Self-pay | Admitting: Internal Medicine

## 2014-12-23 ENCOUNTER — Ambulatory Visit (INDEPENDENT_AMBULATORY_CARE_PROVIDER_SITE_OTHER): Payer: Commercial Managed Care - HMO | Admitting: *Deleted

## 2014-12-23 DIAGNOSIS — Z5181 Encounter for therapeutic drug level monitoring: Secondary | ICD-10-CM

## 2014-12-23 DIAGNOSIS — I4891 Unspecified atrial fibrillation: Secondary | ICD-10-CM | POA: Diagnosis not present

## 2014-12-23 LAB — POCT INR: INR: 2.8

## 2014-12-25 ENCOUNTER — Ambulatory Visit (INDEPENDENT_AMBULATORY_CARE_PROVIDER_SITE_OTHER): Payer: Commercial Managed Care - HMO | Admitting: Nurse Practitioner

## 2014-12-25 ENCOUNTER — Other Ambulatory Visit: Payer: Self-pay

## 2014-12-25 ENCOUNTER — Encounter: Payer: Self-pay | Admitting: Nurse Practitioner

## 2014-12-25 VITALS — BP 126/71 | HR 70 | Temp 97.3°F | Ht 73.0 in | Wt 181.4 lb

## 2014-12-25 DIAGNOSIS — Z85038 Personal history of other malignant neoplasm of large intestine: Secondary | ICD-10-CM

## 2014-12-25 DIAGNOSIS — D649 Anemia, unspecified: Secondary | ICD-10-CM

## 2014-12-25 DIAGNOSIS — R109 Unspecified abdominal pain: Secondary | ICD-10-CM

## 2014-12-25 NOTE — Progress Notes (Signed)
cc'ed to pcp °

## 2014-12-25 NOTE — Patient Instructions (Signed)
1. We will schedule your procedure for you (flexible sigmoidoscopy) 2. Further recommendations to be based on the results of the procedure.

## 2014-12-25 NOTE — Assessment & Plan Note (Signed)
Patient with abdominal pain for the past 3 months, normocytic anemia. Family history of colon cancer and personal history of colon cancer 2006 with total colectomy. Last colon endoscopic exam 2008. Denies other red flag/warning signs/symptoms like bleeding, weight loss, and others. Will plan for repeat flexible sigmoidoscopy.  Proceed with flexible sigmoidoscopy with Dr. Gala Romney in near future: the risks, benefits, and alternatives have been discussed with the patient in detail. The patient states understanding and desires to proceed.

## 2014-12-25 NOTE — Progress Notes (Signed)
Referring Provider: Sinda Du, MD Primary Care Physician:  Alonza Bogus, MD Primary GI:  Dr. Gala Romney  Chief Complaint  Patient presents with  . Abdominal Pain    HPI:   59 year old male presents for abdominal pain. He has a history of colon cancer diagnosed in 2006. He is status post subtotal colectomy. He had numerous polyps as well. He was felt to have limited stage disease. He did not receive adjuvant therapy. His mother had colon cancer he believes after age 30. Last flexable sigmoidoscopy was in 06/13/2006 with essentially normal residual rectum status post total colectomy. Focal area of abnormality adjacent to surute likely not significant, suspect granulation tissue. Biopsy of that area showed benign colonic mucosa with acute inflammation consistent with anastomotic site. Last seen here 07/10/12 and recommended flexible sigmoidoscopy but patient cancelled the procedure and no follow-up since.  Today he states he's been having abdominal pain for about 2 months along with nausea, but his nausea is chronic. Denies vomiting. Has a bowel movement typically a few times a day which is always diarrhea s/p total colectomy. Denies hematochezia and melena. Denies any changes in his bowel movements. Denies fever, chills, unintentional weight loss. Had a CT scan 11/25/14 for his presentation which found no acute findings in the abdomen and pelvis, previous subtotal colectomy, no evidence of recurrent or metastatic carcinoma. Denies any other upper or lower GI symptoms.  Past Medical History  Diagnosis Date  . COPD (chronic obstructive pulmonary disease)   . Arteriosclerotic cardiovascular disease (ASCVD)     Acute MI in 2006->  LAD stent, EF-37%  . Diabetes mellitus, type II     With peripheral neuropathy  . Colon cancer 2006    Partial colectomy in 2006  . Hypertension   . Hyperlipidemia   . Carpal tunnel syndrome   . Tobacco abuse   . Edema     venous insufficiency  . Anemia,  normocytic normochromic 2008    2008  . Pneumonia 01/2012    01/2012; subsequent admission for chest pain  . H/O alcohol dependence   . Complete heart block     S/P ICD 02/09/13  . Coronary artery disease   . Myocardial infarction     Hx: of 2006  . Pacemaker   . Automatic implantable cardioverter-defibrillator in situ   . Shortness of breath     Hx: of   . Neuropathy in diabetes     Hx: of  . H/O hiatal hernia   . Arthritis     Hx: of in hands    Past Surgical History  Procedure Laterality Date  . Subtotal colectomy  09/01/2005    subtotal colectomy  . Coronary angioplasty with stent placement    . Ankle surgery      Right  . Inguinal hernia repair    . Colonoscopy  08/17/2005    Pancolonic diverticula/Multiple rectal polyps removed as described above. The larger of the two likely responsible for intermittent hematochezia/ Multiple polyps at the hepatic flexure resected with a snare./ Large polypoid lesion growing out of the base of the cecum, just behind the  ileocecal valve. This was not felt to be amendable to endoscopic resection. Biopsied multiple times  . Flexible sigmoidoscopy    06/13/2006    Essentially normal residual rectum status post total colectomy. anastomosis at 25cm.  Focal area of abnormality adjacent to suture, as described above, likely not significant, suspect granulation tissue, biopsied.  Anastomosis otherwise appeared normal.  Distal  terminal ileum appeared normal  . Biventricular icd placement  02/09/2013    Dr. Lovena Le  . Multiple extractions with alveoloplasty N/A 04/09/2013    Procedure: Extraction of tooth #'s 1,2,4,5,6,7,8,9,10,11,12,13,17,18,20,21,22,23,27,28, 29,30,31, and 32 with alveoloplasty.;  Surgeon: Lenn Cal, DDS;  Location: [facility];  Service: Oral Surgery;  Laterality: N/A;  . Temporary pacemaker insertion Right 02/09/2013    Procedure: TEMPORARY PACEMAKER INSERTION;  Surgeon: Sinclair Grooms, MD;  Location: [facility] CATH LAB;  Service:  Cardiovascular;  Laterality: Right;  . Permanent pacemaker insertion N/A 02/09/2013    Procedure: PERMANENT PACEMAKER INSERTION;  Surgeon: Evans Lance, MD;  Location: [facility] CATH LAB;  Service: Cardiovascular;  Laterality: N/A;    Current Outpatient Prescriptions  Medication Sig Dispense Refill  . acetaminophen (TYLENOL) 500 MG tablet Take 1 tablet by mouth as needed.    [name] albuterol (PROVENTIL HFA;VENTOLIN HFA) 108 (90 BASE) MCG/ACT inhaler Inhale 2 puffs into the lungs every 6 (six) hours as needed. For shortness of breath    . albuterol (PROVENTIL) (2.5 MG/3ML) 0.083% nebulizer solution Take 2.5 mg by nebulization every 6 (six) hours as needed. For shortness of breath    . aspirin EC 81 MG tablet Take 1 tablet by mouth daily.    . carvedilol (COREG) 6.25 MG tablet Take 6.25 mg by mouth 2 (two) times daily with a meal.     . furosemide (LASIX) 40 MG tablet Take 40 mg by mouth daily.    [name] gabapentin (NEURONTIN) 300 MG capsule Take 300 mg by mouth at bedtime.    [name] ibuprofen (ADVIL,MOTRIN) 200 MG tablet Take 1 tablet by mouth as needed.    . insulin glargine (LANTUS) 100 UNIT/ML injection Inject 20 Units into the skin 2 (two) times daily.     [name] lisinopril (PRINIVIL) 10 MG tablet Take 1 tablet (10 mg total) by mouth daily. 30 tablet 12  . magnesium oxide (MAG-OX) 400 MG tablet Take 400 mg by mouth 2 (two) times daily.    . metFORMIN (GLUCOPHAGE) 500 MG tablet Take 1,000 mg by mouth 2 (two) times daily with a meal.    . nitroGLYCERIN (NITROSTAT) 0.4 MG SL tablet Place 0.4 mg under the tongue every 5 (five) minutes x 3 doses as needed. For chest pain    . sertraline (ZOLOFT) 50 MG tablet Take 50 mg by mouth daily.    . simvastatin (ZOCOR) 80 MG tablet Take 40 mg by mouth at bedtime.    [name] tiZANidine (ZANAFLEX) 4 MG tablet Take 4 mg by mouth 3 (three) times daily.    [name] warfarin (COUMADIN) 4 MG tablet Take 1 1/2 tablets daily 145 tablet 3   No current facility-administered medications for this visit.      Allergies as of 12/25/2014 - Review Complete 12/25/2014  Allergen Reaction Noted  . Vancomycin Itching and Rash 01/15/2012    Family History  Problem Relation Age of Onset  . Colon cancer Mother     Khizar Fiorella, ?>age 24.  [name] Heart disease Mother   . Heart disease Father   . Liver disease Neg Hx   . Hypertension Brother   . Heart disease Brother   . Diabetes Brother     History   Social History  . Marital Status: Married    Spouse Name: N/A  . Number of Children: 1  . Years of Education: N/A   Occupational History  . Parts Manager     Disabled   Social History Main Topics  .  Smoking status: Current Every Day Smoker -- 1.00 packs/day for 40 years    Types: Cigarettes  . Smokeless tobacco: Never Used  . Alcohol Use: No     Comment: Quit 2005 from a 6 pack a day use  . Drug Use: No  . Sexual Activity: No   Other Topics Concern  . None   Social History Narrative    Review of Systems: Gen: Denies fever, chills, anorexia. Denies fatigue, weakness, weight loss.  CV: Denies chest pain, palpitations, syncope, peripheral edema. Resp: Denies dyspnea at rest, wheezing, coughing up blood. GI: Denies vomiting blood, jaundice, and fecal incontinence. Denies dysphagia or odynophagia. Derm: Denies rash, itching, dry skin Psych: Denies depression, anxiety, memory loss, confusion.  Heme: Denies bruising, bleeding, and enlarged lymph nodes.  Physical Exam: BP 126/71 mmHg  Pulse 70  Temp(Src) 97.3 F (36.3 C) (Oral)  Ht 6\' 1"  (1.854 m)  Wt 181 lb 6.4 oz (82.283 kg)  BMI 23.94 kg/m2 General:   Alert and oriented. No distress noted. Pleasant and cooperative.  Head:  Normocephalic and atraumatic. Eyes:  Conjuctiva clear without scleral icterus. Neck:  Supple, without mass or thyromegaly. Lungs:  Clear to auscultation bilaterally. No wheezes, rales, or rhonchi. No distress.  Heart:  S1, S2 present without murmurs, rubs, or gallops. Regular rate and rhythm. Abdomen:   +BS, soft, non-tender and non-distended. No rebound or guarding. No HSM or masses noted. Pulses:  2+ DP noted bilaterally Extremities:  Without edema. Neurologic:  Alert and  oriented x4;  grossly normal neurologically. Skin:  Intact without significant lesions or rashes. Cervical Nodes:  No significant cervical adenopathy. Psych:  Alert and cooperative. Normal mood and affect.    12/25/2014 10:25 AM

## 2015-01-13 ENCOUNTER — Encounter (HOSPITAL_COMMUNITY)
Admission: RE | Disposition: A | Discharge status: Home / Self Care | Payer: Self-pay | Source: Ambulatory Visit | Attending: Internal Medicine

## 2015-01-13 ENCOUNTER — Ambulatory Visit (HOSPITAL_COMMUNITY)
Admission: RE | Admit: 2015-01-13 | Discharge: 2015-01-13 | Disposition: A | Discharge status: Home / Self Care | Payer: Commercial Managed Care - HMO | Source: Ambulatory Visit | Attending: Internal Medicine | Admitting: Internal Medicine

## 2015-01-13 ENCOUNTER — Encounter (HOSPITAL_COMMUNITY): Payer: Self-pay | Admitting: *Deleted

## 2015-01-13 DIAGNOSIS — I2510 Atherosclerotic heart disease of native coronary artery without angina pectoris: Secondary | ICD-10-CM | POA: Diagnosis not present

## 2015-01-13 DIAGNOSIS — Z980 Intestinal bypass and anastomosis status: Secondary | ICD-10-CM | POA: Diagnosis not present

## 2015-01-13 DIAGNOSIS — Z7901 Long term (current) use of anticoagulants: Secondary | ICD-10-CM | POA: Insufficient documentation

## 2015-01-13 DIAGNOSIS — Z955 Presence of coronary angioplasty implant and graft: Secondary | ICD-10-CM | POA: Insufficient documentation

## 2015-01-13 DIAGNOSIS — Z85038 Personal history of other malignant neoplasm of large intestine: Secondary | ICD-10-CM | POA: Diagnosis not present

## 2015-01-13 DIAGNOSIS — Z9049 Acquired absence of other specified parts of digestive tract: Secondary | ICD-10-CM | POA: Insufficient documentation

## 2015-01-13 DIAGNOSIS — Z950 Presence of cardiac pacemaker: Secondary | ICD-10-CM | POA: Diagnosis not present

## 2015-01-13 DIAGNOSIS — M1990 Unspecified osteoarthritis, unspecified site: Secondary | ICD-10-CM | POA: Diagnosis not present

## 2015-01-13 DIAGNOSIS — I252 Old myocardial infarction: Secondary | ICD-10-CM | POA: Diagnosis not present

## 2015-01-13 DIAGNOSIS — Z791 Long term (current) use of non-steroidal anti-inflammatories (NSAID): Secondary | ICD-10-CM | POA: Insufficient documentation

## 2015-01-13 DIAGNOSIS — F17210 Nicotine dependence, cigarettes, uncomplicated: Secondary | ICD-10-CM | POA: Diagnosis not present

## 2015-01-13 DIAGNOSIS — E785 Hyperlipidemia, unspecified: Secondary | ICD-10-CM | POA: Insufficient documentation

## 2015-01-13 DIAGNOSIS — Z79899 Other long term (current) drug therapy: Secondary | ICD-10-CM | POA: Insufficient documentation

## 2015-01-13 DIAGNOSIS — R110 Nausea: Secondary | ICD-10-CM | POA: Diagnosis not present

## 2015-01-13 DIAGNOSIS — Z794 Long term (current) use of insulin: Secondary | ICD-10-CM | POA: Insufficient documentation

## 2015-01-13 DIAGNOSIS — R109 Unspecified abdominal pain: Secondary | ICD-10-CM | POA: Diagnosis not present

## 2015-01-13 DIAGNOSIS — K649 Unspecified hemorrhoids: Secondary | ICD-10-CM | POA: Diagnosis not present

## 2015-01-13 DIAGNOSIS — J449 Chronic obstructive pulmonary disease, unspecified: Secondary | ICD-10-CM | POA: Diagnosis not present

## 2015-01-13 DIAGNOSIS — Z7982 Long term (current) use of aspirin: Secondary | ICD-10-CM | POA: Insufficient documentation

## 2015-01-13 DIAGNOSIS — E1140 Type 2 diabetes mellitus with diabetic neuropathy, unspecified: Secondary | ICD-10-CM | POA: Diagnosis not present

## 2015-01-13 DIAGNOSIS — Z800 Family history of malignant neoplasm of digestive organs: Secondary | ICD-10-CM | POA: Diagnosis not present

## 2015-01-13 DIAGNOSIS — D649 Anemia, unspecified: Secondary | ICD-10-CM | POA: Insufficient documentation

## 2015-01-13 DIAGNOSIS — I10 Essential (primary) hypertension: Secondary | ICD-10-CM | POA: Insufficient documentation

## 2015-01-13 DIAGNOSIS — F1020 Alcohol dependence, uncomplicated: Secondary | ICD-10-CM | POA: Diagnosis not present

## 2015-01-13 HISTORY — PX: FLEXIBLE SIGMOIDOSCOPY: SHX5431

## 2015-01-13 LAB — GLUCOSE, CAPILLARY: GLUCOSE-CAPILLARY: 107 mg/dL — AB (ref 70–99)

## 2015-01-13 SURGERY — SIGMOIDOSCOPY, FLEXIBLE
Anesthesia: Moderate Sedation

## 2015-01-13 MED ORDER — SIMETHICONE 40 MG/0.6ML PO SUSP
ORAL | Status: DC | PRN
  Administered 2015-01-13: 08:00:00

## 2015-01-13 MED ORDER — MIDAZOLAM HCL 5 MG/5ML IJ SOLN
INTRAMUSCULAR | Status: AC
  Filled 2015-01-13: qty 10

## 2015-01-13 MED ORDER — MEPERIDINE HCL 100 MG/ML IJ SOLN
INTRAMUSCULAR | Status: AC
  Filled 2015-01-13: qty 2

## 2015-01-13 MED ORDER — MEPERIDINE HCL 100 MG/ML IJ SOLN
INTRAMUSCULAR | Status: DC | PRN
  Administered 2015-01-13 (×2): 50 mg via INTRAVENOUS

## 2015-01-13 MED ORDER — SODIUM CHLORIDE 0.9 % IV SOLN
INTRAVENOUS | Status: DC
  Administered 2015-01-13: 07:00:00 via INTRAVENOUS

## 2015-01-13 MED ORDER — MIDAZOLAM HCL 5 MG/5ML IJ SOLN
INTRAMUSCULAR | Status: DC | PRN
  Administered 2015-01-13 (×2): 2 mg via INTRAVENOUS

## 2015-01-13 MED ORDER — ONDANSETRON HCL 4 MG/2ML IJ SOLN
INTRAMUSCULAR | Status: AC
  Filled 2015-01-13: qty 2

## 2015-01-13 MED ORDER — ONDANSETRON HCL 4 MG/2ML IJ SOLN
INTRAMUSCULAR | Status: DC | PRN
  Administered 2015-01-13: 4 mg via INTRAVENOUS

## 2015-01-13 NOTE — Interval H&P Note (Signed)
History and Physical Interval Note:  01/13/2015 7:37 AM  [name]  has presented today for surgery, with the diagnosis of abd pain, anemia, hx of colon cancer  The various methods of treatment have been discussed with the patient and family. After consideration of risks, benefits and other options for treatment, the patient has consented to  Procedure(s) with comments: FLEXIBLE SIGMOIDOSCOPY (N/A) - 735am as a surgical intervention .  The patient's history has been reviewed, patient examined, no change in status, stable for surgery.  I have reviewed the patient's chart and labs.  Questions were answered to the patient's satisfaction.     [name]  No change. Abdominal pain persists. I do not appreciate an umbilical hernia on exam today. No abnormality on recent CT to explain symptoms. Sigmoidoscopy today per plan. The risks, benefits, limitations, alternatives and imponderables have been reviewed with the patient. Questions have been answered. All parties are agreeable.

## 2015-01-13 NOTE — Op Note (Signed)
[name], [name]                ACCOUNT NO.:  [id number]  MEDICAL RECORD NO.:  [id number]  LOCATION:  [facility]                          FACILITY:  [facility]  PHYSICIAN:  R. Garfield Cornea, MD [place]:  [birth year]  DATE OF PROCEDURE:  01/13/2015 DATE OF DISCHARGE:                              OPERATIVE REPORT   PROCEDURE:  Surveillance sigmoidoscopy.  INDICATIONS FOR PROCEDURE:  A 59 year old gentleman, history of subtotal colectomy for colon cancer, nearly 10 years ago.  He has done well.  He has had some periumbilical pain, the cause of which has not been well defined.  CT negative for abdominal wall hernia.  He does have some persisting tenderness just to the right of the umbilicus today.  I did not appreciate a hernia. Sigmoidoscopy is now being done as a surveillance maneuver, which is down to surveillance distal lower GI tract.  Risks, benefits, limitations, alternatives, imponderables have been discussed, questions answered.  Please see the documentation medical record.  PROCEDURE NOTE:  An O2 saturation, blood pressure, pulse, respirations monitored, throughout the entire procedure.  Conscious sedation Versed 4 mg IV, Demerol 100 mg IV in divided doses.  INSTRUMENT:  Pentax video chip system.  FINDINGS:  Digital rectal exam revealed no abnormalities.  The scope was prep was adequate.  Examination of the rectal mucosa up to 15 cm revealed normal residual colonic mucosa, retroflexion was performed. The anastomosis appeared normal as did the distal near terminal ileum. The scope was slowly withdrawn.  All previously because surfaces were again seen.  The patient did have some anal canal hemorrhoids.  The patient tolerated the procedure well and was taken to postop in stable condition.  IMPRESSION:  Normal-appearing residual rectum, surgical anastomosis, status post subtotal colectomy.  RECOMMENDATIONS:  Repeat sigmoidoscopy in 5 years.  I suggested the patient to  see Dr. Aviva Signs just to evaluate his abdominal pain.     Bridgette Habermann, MD FACP [facility]     RMR/MEDQ  D:  01/13/2015  T:  01/13/2015  Job:  [id number]  cc:   Percell Miller L. Luan Pulling, M.D. Fax: [phone]

## 2015-01-13 NOTE — H&P (View-Only) (Signed)
Referring Provider: Sinda Du, MD Primary Care Physician:  Alonza Bogus, MD Primary GI:  Dr. Gala Romney  Chief Complaint  Patient presents with  . Abdominal Pain    HPI:   58 year old male presents for abdominal pain. He has a history of colon cancer diagnosed in 2006. He is status post subtotal colectomy. He had numerous polyps as well. He was felt to have limited stage disease. He did not receive adjuvant therapy. His mother had colon cancer he believes after age 20. Last flexable sigmoidoscopy was in 06/13/2006 with essentially normal residual rectum status post total colectomy. Focal area of abnormality adjacent to surute likely not significant, suspect granulation tissue. Biopsy of that area showed benign colonic mucosa with acute inflammation consistent with anastomotic site. Last seen here 07/10/12 and recommended flexible sigmoidoscopy but patient cancelled the procedure and no follow-up since.  Today he states he's been having abdominal pain for about 2 months along with nausea, but his nausea is chronic. Denies vomiting. Has a bowel movement typically a few times a day which is always diarrhea s/p total colectomy. Denies hematochezia and melena. Denies any changes in his bowel movements. Denies fever, chills, unintentional weight loss. Had a CT scan 11/25/14 for his presentation which found no acute findings in the abdomen and pelvis, previous subtotal colectomy, no evidence of recurrent or metastatic carcinoma. Denies any other upper or lower GI symptoms.  Past Medical History  Diagnosis Date  . COPD (chronic obstructive pulmonary disease)   . Arteriosclerotic cardiovascular disease (ASCVD)     Acute MI in 2006->  LAD stent, EF-37%  . Diabetes mellitus, type II     With peripheral neuropathy  . Colon cancer 2006    Partial colectomy in 2006  . Hypertension   . Hyperlipidemia   . Carpal tunnel syndrome   . Tobacco abuse   . Edema     venous insufficiency  . Anemia,  normocytic normochromic 2008    2008  . Pneumonia 01/2012    01/2012; subsequent admission for chest pain  . H/O alcohol dependence   . Complete heart block     S/P ICD 02/09/13  . Coronary artery disease   . Myocardial infarction     Hx: of 2006  . Pacemaker   . Automatic implantable cardioverter-defibrillator in situ   . Shortness of breath     Hx: of   . Neuropathy in diabetes     Hx: of  . H/O hiatal hernia   . Arthritis     Hx: of in hands    Past Surgical History  Procedure Laterality Date  . Subtotal colectomy  09/01/2005    subtotal colectomy  . Coronary angioplasty with stent placement    . Ankle surgery      Right  . Inguinal hernia repair    . Colonoscopy  08/17/2005    Pancolonic diverticula/Multiple rectal polyps removed as described above. The larger of the two likely responsible for intermittent hematochezia/ Multiple polyps at the hepatic flexure resected with a snare./ Large polypoid lesion growing out of the base of the cecum, just behind the  ileocecal valve. This was not felt to be amendable to endoscopic resection. Biopsied multiple times  . Flexible sigmoidoscopy    06/13/2006    Essentially normal residual rectum status post total colectomy. anastomosis at 25cm.  Focal area of abnormality adjacent to suture, as described above, likely not significant, suspect granulation tissue, biopsied.  Anastomosis otherwise appeared normal.  Distal  terminal ileum appeared normal  . Biventricular icd placement  02/09/2013    Dr. Lovena Le  . Multiple extractions with alveoloplasty N/A 04/09/2013    Procedure: Extraction of tooth #'s 1,2,4,5,6,7,8,9,10,11,12,13,17,18,20,21,22,23,27,28, 29,30,31, and 32 with alveoloplasty.;  Surgeon: Lenn Cal, DDS;  Location: [facility];  Service: Oral Surgery;  Laterality: N/A;  . Temporary pacemaker insertion Right 02/09/2013    Procedure: TEMPORARY PACEMAKER INSERTION;  Surgeon: Sinclair Grooms, MD;  Location: [facility] CATH LAB;  Service:  Cardiovascular;  Laterality: Right;  . Permanent pacemaker insertion N/A 02/09/2013    Procedure: PERMANENT PACEMAKER INSERTION;  Surgeon: Evans Lance, MD;  Location: [facility] CATH LAB;  Service: Cardiovascular;  Laterality: N/A;    Current Outpatient Prescriptions  Medication Sig Dispense Refill  . acetaminophen (TYLENOL) 500 MG tablet Take 1 tablet by mouth as needed.    [name] albuterol (PROVENTIL HFA;VENTOLIN HFA) 108 (90 BASE) MCG/ACT inhaler Inhale 2 puffs into the lungs every 6 (six) hours as needed. For shortness of breath    . albuterol (PROVENTIL) (2.5 MG/3ML) 0.083% nebulizer solution Take 2.5 mg by nebulization every 6 (six) hours as needed. For shortness of breath    . aspirin EC 81 MG tablet Take 1 tablet by mouth daily.    . carvedilol (COREG) 6.25 MG tablet Take 6.25 mg by mouth 2 (two) times daily with a meal.     . furosemide (LASIX) 40 MG tablet Take 40 mg by mouth daily.    [name] gabapentin (NEURONTIN) 300 MG capsule Take 300 mg by mouth at bedtime.    [name] ibuprofen (ADVIL,MOTRIN) 200 MG tablet Take 1 tablet by mouth as needed.    . insulin glargine (LANTUS) 100 UNIT/ML injection Inject 20 Units into the skin 2 (two) times daily.     [name] lisinopril (PRINIVIL) 10 MG tablet Take 1 tablet (10 mg total) by mouth daily. 30 tablet 12  . magnesium oxide (MAG-OX) 400 MG tablet Take 400 mg by mouth 2 (two) times daily.    . metFORMIN (GLUCOPHAGE) 500 MG tablet Take 1,000 mg by mouth 2 (two) times daily with a meal.    . nitroGLYCERIN (NITROSTAT) 0.4 MG SL tablet Place 0.4 mg under the tongue every 5 (five) minutes x 3 doses as needed. For chest pain    . sertraline (ZOLOFT) 50 MG tablet Take 50 mg by mouth daily.    . simvastatin (ZOCOR) 80 MG tablet Take 40 mg by mouth at bedtime.    [name] tiZANidine (ZANAFLEX) 4 MG tablet Take 4 mg by mouth 3 (three) times daily.    [name] warfarin (COUMADIN) 4 MG tablet Take 1 1/2 tablets daily 145 tablet 3   No current facility-administered medications for this visit.      Allergies as of 12/25/2014 - Review Complete 12/25/2014  Allergen Reaction Noted  . Vancomycin Itching and Rash 01/15/2012    Family History  Problem Relation Age of Onset  . Colon cancer Mother     Platon Arocho, ?>age 29.  [name] Heart disease Mother   . Heart disease Father   . Liver disease Neg Hx   . Hypertension Brother   . Heart disease Brother   . Diabetes Brother     History   Social History  . Marital Status: Married    Spouse Name: N/A  . Number of Children: 1  . Years of Education: N/A   Occupational History  . Parts Manager     Disabled   Social History Main Topics  .  Smoking status: Current Every Day Smoker -- 1.00 packs/day for 40 years    Types: Cigarettes  . Smokeless tobacco: Never Used  . Alcohol Use: No     Comment: Quit 2005 from a 6 pack a day use  . Drug Use: No  . Sexual Activity: No   Other Topics Concern  . None   Social History Narrative    Review of Systems: Gen: Denies fever, chills, anorexia. Denies fatigue, weakness, weight loss.  CV: Denies chest pain, palpitations, syncope, peripheral edema. Resp: Denies dyspnea at rest, wheezing, coughing up blood. GI: Denies vomiting blood, jaundice, and fecal incontinence. Denies dysphagia or odynophagia. Derm: Denies rash, itching, dry skin Psych: Denies depression, anxiety, memory loss, confusion.  Heme: Denies bruising, bleeding, and enlarged lymph nodes.  Physical Exam: BP 126/71 mmHg  Pulse 70  Temp(Src) 97.3 F (36.3 C) (Oral)  Ht 6\' 1"  (1.854 m)  Wt 181 lb 6.4 oz (82.283 kg)  BMI 23.94 kg/m2 General:   Alert and oriented. No distress noted. Pleasant and cooperative.  Head:  Normocephalic and atraumatic. Eyes:  Conjuctiva clear without scleral icterus. Neck:  Supple, without mass or thyromegaly. Lungs:  Clear to auscultation bilaterally. No wheezes, rales, or rhonchi. No distress.  Heart:  S1, S2 present without murmurs, rubs, or gallops. Regular rate and rhythm. Abdomen:   +BS, soft, non-tender and non-distended. No rebound or guarding. No HSM or masses noted. Pulses:  2+ DP noted bilaterally Extremities:  Without edema. Neurologic:  Alert and  oriented x4;  grossly normal neurologically. Skin:  Intact without significant lesions or rashes. Cervical Nodes:  No significant cervical adenopathy. Psych:  Alert and cooperative. Normal mood and affect.    12/25/2014 10:25 AM

## 2015-01-13 NOTE — Discharge Instructions (Addendum)
°  Sigmoidoscopy Discharge Instructions  Read the instructions outlined below and refer to this sheet in the next few weeks. These discharge instructions provide you with general information on caring for yourself after you leave the hospital. Your doctor may also give you specific instructions. While your treatment has been planned according to the most current medical practices available, unavoidable complications occasionally occur. If you have any problems or questions after discharge, call Dr. Gala Romney at [phone]. ACTIVITY  You may resume your regular activity, but move at a slower pace for the next 24 hours.   Take frequent rest periods for the next 24 hours.   Walking will help get rid of the air and reduce the bloated feeling in your belly (abdomen).   No driving for 24 hours (because of the medicine (anesthesia) used during the test).    Do not sign any important legal documents or operate any machinery for 24 hours (because of the anesthesia used during the test).  NUTRITION  Drink plenty of fluids.   You may resume your normal diet as instructed by your doctor.   Begin with a light meal and progress to your normal diet. Heavy or fried foods are harder to digest and may make you feel sick to your stomach (nauseated).   Avoid alcoholic beverages for 24 hours or as instructed.  MEDICATIONS  You may resume your normal medications unless your doctor tells you otherwise.  WHAT YOU CAN EXPECT TODAY  Some feelings of bloating in the abdomen.   Passage of more gas than usual.   Spotting of blood in your stool or on the toilet paper.  IF YOU HAD POLYPS REMOVED DURING THE COLONOSCOPY:  No aspirin products for 7 days or as instructed.   No alcohol for 7 days or as instructed.   Eat a soft diet for the next 24 hours.  FINDING OUT THE RESULTS OF YOUR TEST Not all test results are available during your visit. If your test results are not back during the visit, make an appointment  with your caregiver to find out the results. Do not assume everything is normal if you have not heard from your caregiver or the medical facility. It is important for you to follow up on all of your test results.  SEEK IMMEDIATE MEDICAL ATTENTION IF:  You have more than a spotting of blood in your stool.   Your belly is swollen (abdominal distention).   You are nauseated or vomiting.   You have a temperature over 101.   You have abdominal pain or discomfort that is severe or gets worse throughout the day.     Repeat sigmoidoscopy in 5 years.  See Dr. Arnoldo Morale regarding abdominal pain

## 2015-01-14 ENCOUNTER — Encounter (HOSPITAL_COMMUNITY): Payer: Self-pay | Admitting: Internal Medicine

## 2015-01-15 ENCOUNTER — Ambulatory Visit (INDEPENDENT_AMBULATORY_CARE_PROVIDER_SITE_OTHER): Payer: Commercial Managed Care - HMO | Admitting: *Deleted

## 2015-01-15 DIAGNOSIS — I4891 Unspecified atrial fibrillation: Secondary | ICD-10-CM | POA: Diagnosis not present

## 2015-01-15 DIAGNOSIS — Z5181 Encounter for therapeutic drug level monitoring: Secondary | ICD-10-CM

## 2015-01-15 LAB — POCT INR: INR: 2.2

## 2015-02-05 ENCOUNTER — Encounter: Payer: Self-pay | Admitting: Internal Medicine

## 2015-02-05 ENCOUNTER — Ambulatory Visit (INDEPENDENT_AMBULATORY_CARE_PROVIDER_SITE_OTHER): Payer: Commercial Managed Care - HMO | Admitting: Internal Medicine

## 2015-02-05 VITALS — BP 130/70 | HR 60 | Ht 72.0 in | Wt 183.4 lb

## 2015-02-05 DIAGNOSIS — Z95810 Presence of automatic (implantable) cardiac defibrillator: Secondary | ICD-10-CM

## 2015-02-05 DIAGNOSIS — I482 Chronic atrial fibrillation, unspecified: Secondary | ICD-10-CM

## 2015-02-05 DIAGNOSIS — I5022 Chronic systolic (congestive) heart failure: Secondary | ICD-10-CM

## 2015-02-05 LAB — CUP PACEART INCLINIC DEVICE CHECK
Date Time Interrogation Session: 20160504040000
HIGH POWER IMPEDANCE MEASURED VALUE: 53 Ohm
HighPow Impedance: 84 Ohm
Lead Channel Impedance Value: 682 Ohm
Lead Channel Impedance Value: 695 Ohm
Lead Channel Pacing Threshold Amplitude: 0.4 V
Lead Channel Pacing Threshold Pulse Width: 0.4 ms
Lead Channel Setting Pacing Amplitude: 2.5 V
Lead Channel Setting Pacing Pulse Width: 0.4 ms
Lead Channel Setting Pacing Pulse Width: 0.4 ms
Lead Channel Setting Sensing Sensitivity: 1 mV
MDC IDC MSMT LEADCHNL LV IMPEDANCE VALUE: 823 Ohm
MDC IDC MSMT LEADCHNL LV PACING THRESHOLD AMPLITUDE: 0.8 V
MDC IDC MSMT LEADCHNL LV PACING THRESHOLD PULSEWIDTH: 0.4 ms
MDC IDC MSMT LEADCHNL RA SENSING INTR AMPL: 5.9 mV
MDC IDC SET LEADCHNL LV PACING AMPLITUDE: 2.5 V
MDC IDC SET LEADCHNL RV SENSING SENSITIVITY: 0.6 mV
MDC IDC SET ZONE DETECTION INTERVAL: 333 ms
Pulse Gen Serial Number: 950230
Zone Setting Detection Interval: 286 ms

## 2015-02-05 NOTE — Assessment & Plan Note (Signed)
His BiV ICD is reprogrammed today from DDD to VVI as he is chronically in atrial fib. Will recheck in several months.

## 2015-02-05 NOTE — Assessment & Plan Note (Signed)
His ventricular rate is well controlled. He will continue his current meds. 

## 2015-02-05 NOTE — Patient Instructions (Signed)
Latitude communicator to be ordered. Remote monitoring system  Latitude check(home check) for 05-07-15 and Return office visit with Dr Lovena Le in 1 year.  No changes made.

## 2015-02-05 NOTE — Assessment & Plan Note (Signed)
His symptoms remain class 2. He will continue his current meds.

## 2015-02-05 NOTE — Progress Notes (Signed)
HPI [name] returns today for followup. He is a pleasant 59 yo man with an ICM, CHB, s/p BiV ICD, who has developed atrial fib/flutter in the interim. He is asymptomatic. He denies chest pain or sob. No edema or palpitations. He c/o numbness in his legs from neuropathy. Allergies  Allergen Reactions  . Vancomycin Itching and Rash     Current Outpatient Prescriptions  Medication Sig Dispense Refill  . acetaminophen (TYLENOL) 500 MG tablet Take 1 tablet by mouth as needed.    [name] albuterol (PROVENTIL HFA;VENTOLIN HFA) 108 (90 BASE) MCG/ACT inhaler Inhale 2 puffs into the lungs every 6 (six) hours as needed. For shortness of breath    . albuterol (PROVENTIL) (2.5 MG/3ML) 0.083% nebulizer solution Take 2.5 mg by nebulization every 6 (six) hours as needed. For shortness of breath    . aspirin EC 81 MG tablet Take 1 tablet by mouth daily.    . carvedilol (COREG) 6.25 MG tablet Take 6.25 mg by mouth 2 (two) times daily with a meal.     . furosemide (LASIX) 40 MG tablet Take 40 mg by mouth daily.    [name] gabapentin (NEURONTIN) 300 MG capsule Take 300 mg by mouth at bedtime.    . insulin glargine (LANTUS) 100 UNIT/ML injection Inject 20 Units into the skin 2 (two) times daily.     [name] lisinopril (PRINIVIL) 10 MG tablet Take 1 tablet (10 mg total) by mouth daily. 30 tablet 12  . magnesium oxide (MAG-OX) 400 MG tablet Take 400 mg by mouth 2 (two) times daily.    . metFORMIN (GLUCOPHAGE) 500 MG tablet Take 1,000 mg by mouth 2 (two) times daily with a meal.    . nitroGLYCERIN (NITROSTAT) 0.4 MG SL tablet Place 0.4 mg under the tongue every 5 (five) minutes x 3 doses as needed. For chest pain    . Omega-3 Fatty Acids (FISH OIL) 1000 MG CAPS Take 1,000 mg by mouth 2 (two) times daily.    . promethazine (PHENERGAN) 25 MG tablet Take one tablet by mouth 4 times daily as needed for nausea    . sertraline (ZOLOFT) 50 MG tablet Take 50 mg by mouth daily.    . simvastatin (ZOCOR) 80 MG tablet Take 40 mg by  mouth at bedtime.    [name] tiZANidine (ZANAFLEX) 4 MG tablet Take 4 mg by mouth 3 (three) times daily.    [name] warfarin (COUMADIN) 4 MG tablet Take 1 1/2 tablets daily (Patient taking differently: Take 6-8 mg by mouth daily. Take 2 tablets on Mon,Wednesday, Fri, and Sat. Take 1.5 tablets on Sun,Tues, and Thurs) 145 tablet 3  . loratadine (CLARITIN) 10 MG tablet Take 10 mg by mouth daily. As needed    . traMADol (ULTRAM) 50 MG tablet Take 50 mg by mouth every 6 (six) hours as needed.     No current facility-administered medications for this visit.     Past Medical History  Diagnosis Date  . COPD (chronic obstructive pulmonary disease)   . Arteriosclerotic cardiovascular disease (ASCVD)     Acute MI in 2006->  LAD stent, EF-37%  . Diabetes mellitus, type II     With peripheral neuropathy  . Colon cancer 2006    Partial colectomy in 2006  . Hypertension   . Hyperlipidemia   . Carpal tunnel syndrome   . Tobacco abuse   . Edema     venous insufficiency  . Anemia, normocytic normochromic 2008    2008  .  Pneumonia 01/2012    01/2012; subsequent admission for chest pain  . H/O alcohol dependence   . Complete heart block     S/P ICD 02/09/13  . Coronary artery disease   . Myocardial infarction     Hx: of 2006  . Pacemaker   . Automatic implantable cardioverter-defibrillator in situ   . Shortness of breath     Hx: of   . Neuropathy in diabetes     Hx: of  . H/O hiatal hernia   . Arthritis     Hx: of in hands    ROS:   All systems reviewed and negative except as noted in the HPI.   Past Surgical History  Procedure Laterality Date  . Subtotal colectomy  09/01/2005    subtotal colectomy  . Coronary angioplasty with stent placement    . Ankle surgery      Right  . Inguinal hernia repair    . Colonoscopy  08/17/2005    Pancolonic diverticula/Multiple rectal polyps removed as described above. The larger of the two likely responsible for intermittent hematochezia/ Multiple polyps at  the hepatic flexure resected with a snare./ Large polypoid lesion growing out of the base of the cecum, just behind the  ileocecal valve. This was not felt to be amendable to endoscopic resection. Biopsied multiple times  . Flexible sigmoidoscopy    06/13/2006    Essentially normal residual rectum status post total colectomy. anastomosis at 25cm.  Focal area of abnormality adjacent to suture, as described above, likely not significant, suspect granulation tissue, biopsied.  Anastomosis otherwise appeared normal.  Distal terminal ileum appeared normal  . Biventricular icd placement  02/09/2013    Dr. Lovena Le  . Multiple extractions with alveoloplasty N/A 04/09/2013    Procedure: Extraction of tooth #'s 1,2,4,5,6,7,8,9,10,11,12,13,17,18,20,21,22,23,27,28, 29,30,31, and 32 with alveoloplasty.;  Surgeon: Lenn Cal, DDS;  Location: [facility];  Service: Oral Surgery;  Laterality: N/A;  . Temporary pacemaker insertion Right 02/09/2013    Procedure: TEMPORARY PACEMAKER INSERTION;  Surgeon: Sinclair Grooms, MD;  Location: [facility] CATH LAB;  Service: Cardiovascular;  Laterality: Right;  . Permanent pacemaker insertion N/A 02/09/2013    Procedure: PERMANENT PACEMAKER INSERTION;  Surgeon: Evans Lance, MD;  Location: [facility] CATH LAB;  Service: Cardiovascular;  Laterality: N/A;  . Flexible sigmoidoscopy N/A 01/13/2015    Procedure: FLEXIBLE SIGMOIDOSCOPY;  Surgeon: Daneil Dolin, MD;  Location: [facility];  Service: Endoscopy;  Laterality: N/A;  735am     Family History  Problem Relation Age of Onset  . Colon cancer Mother     [name], ?>age 64.  [name] Heart disease Mother   . Heart disease Father   . Liver disease Neg Hx   . Hypertension Brother   . Heart disease Brother   . Diabetes Brother      History   Social History  . Marital Status: Married    Spouse Name: N/A  . Number of Children: 1  . Years of Education: N/A   Occupational History  . Parts Manager     Disabled   Social History Main  Topics  . Smoking status: Current Every Day Smoker -- 1.00 packs/day for 40 years    Types: Cigarettes  . Smokeless tobacco: Never Used  . Alcohol Use: No     Comment: Quit 2005 from a 6 pack a day use  . Drug Use: No  . Sexual Activity: No   Other Topics Concern  . Not on file  Social History Narrative     BP 130/70 mmHg  Pulse 60  Ht 6' (1.829 m)  Wt 183 lb 6 oz (83.178 kg)  BMI 24.86 kg/m2  Physical Exam:  stable appearing middle aged man, NAD HEENT: Unremarkable Neck:  No JVD, no thyromegally Back:  No CVA tenderness Lungs:  Clear with no wheezes HEART:  Regular rate rhythm, no murmurs, no rubs, no clicks Abd:  soft, positive bowel sounds, no organomegally, no rebound, no guarding Ext:  2 plus pulses, no edema, no cyanosis, no clubbing Skin:  No rashes no nodules Neuro:  CN II through XII intact, motor grossly intact   DEVICE  Normal device function.  See PaceArt for details. Underlying rhythm is atrial fib/flutter.  Assess/Plan:

## 2015-02-12 ENCOUNTER — Ambulatory Visit (INDEPENDENT_AMBULATORY_CARE_PROVIDER_SITE_OTHER): Payer: Commercial Managed Care - HMO | Admitting: *Deleted

## 2015-02-12 DIAGNOSIS — Z5181 Encounter for therapeutic drug level monitoring: Secondary | ICD-10-CM

## 2015-02-12 DIAGNOSIS — I4891 Unspecified atrial fibrillation: Secondary | ICD-10-CM | POA: Diagnosis not present

## 2015-02-12 LAB — POCT INR: INR: 3

## 2015-02-14 ENCOUNTER — Encounter: Payer: Self-pay | Admitting: Internal Medicine

## 2015-03-25 ENCOUNTER — Encounter: Payer: Self-pay | Admitting: Internal Medicine

## 2015-03-26 ENCOUNTER — Ambulatory Visit (INDEPENDENT_AMBULATORY_CARE_PROVIDER_SITE_OTHER): Payer: Commercial Managed Care - HMO | Admitting: *Deleted

## 2015-03-26 DIAGNOSIS — Z5181 Encounter for therapeutic drug level monitoring: Secondary | ICD-10-CM | POA: Diagnosis not present

## 2015-03-26 DIAGNOSIS — I4891 Unspecified atrial fibrillation: Secondary | ICD-10-CM

## 2015-03-26 LAB — POCT INR: INR: 3

## 2015-05-07 ENCOUNTER — Ambulatory Visit (INDEPENDENT_AMBULATORY_CARE_PROVIDER_SITE_OTHER): Payer: Commercial Managed Care - HMO | Admitting: *Deleted

## 2015-05-07 ENCOUNTER — Telehealth: Payer: Self-pay | Admitting: Cardiology

## 2015-05-07 ENCOUNTER — Encounter: Payer: Commercial Managed Care - HMO | Admitting: *Deleted

## 2015-05-07 DIAGNOSIS — I4891 Unspecified atrial fibrillation: Secondary | ICD-10-CM | POA: Diagnosis not present

## 2015-05-07 DIAGNOSIS — Z5181 Encounter for therapeutic drug level monitoring: Secondary | ICD-10-CM

## 2015-05-07 LAB — POCT INR: INR: 2.3

## 2015-05-07 NOTE — Telephone Encounter (Signed)
Spoke with pt and reminded pt of remote transmission that is due today. Pt stated that he never received his home monitor and that he does not want to use home monitor. He wants to continue coming into the office. Pt agreed to appt on 05-30-15 at 11:00 AM at the [facility] office w/ the device clinic.

## 2015-05-08 ENCOUNTER — Encounter: Payer: Self-pay | Admitting: Cardiology

## 2015-06-11 ENCOUNTER — Other Ambulatory Visit: Payer: Self-pay | Admitting: Internal Medicine

## 2015-06-18 ENCOUNTER — Ambulatory Visit (INDEPENDENT_AMBULATORY_CARE_PROVIDER_SITE_OTHER): Payer: Commercial Managed Care - HMO | Admitting: *Deleted

## 2015-06-18 DIAGNOSIS — Z5181 Encounter for therapeutic drug level monitoring: Secondary | ICD-10-CM | POA: Diagnosis not present

## 2015-06-18 DIAGNOSIS — I4891 Unspecified atrial fibrillation: Secondary | ICD-10-CM

## 2015-06-18 LAB — POCT INR: INR: 2.2

## 2015-06-26 ENCOUNTER — Ambulatory Visit (INDEPENDENT_AMBULATORY_CARE_PROVIDER_SITE_OTHER): Payer: Commercial Managed Care - HMO | Admitting: *Deleted

## 2015-06-26 DIAGNOSIS — I482 Chronic atrial fibrillation, unspecified: Secondary | ICD-10-CM

## 2015-06-26 DIAGNOSIS — I5022 Chronic systolic (congestive) heart failure: Secondary | ICD-10-CM | POA: Diagnosis not present

## 2015-06-26 LAB — CUP PACEART INCLINIC DEVICE CHECK
Date Time Interrogation Session: 20160922040000
HIGH POWER IMPEDANCE MEASURED VALUE: 53 Ohm
HIGH POWER IMPEDANCE MEASURED VALUE: 90 Ohm
Lead Channel Impedance Value: 663 Ohm
Lead Channel Impedance Value: 739 Ohm
Lead Channel Pacing Threshold Amplitude: 0.6 V
Lead Channel Pacing Threshold Amplitude: 0.8 V
Lead Channel Pacing Threshold Pulse Width: 0.4 ms
Lead Channel Sensing Intrinsic Amplitude: 6 mV
Lead Channel Setting Pacing Amplitude: 2.5 V
Lead Channel Setting Pacing Pulse Width: 0.4 ms
Lead Channel Setting Sensing Sensitivity: 0.6 mV
MDC IDC MSMT LEADCHNL LV IMPEDANCE VALUE: 816 Ohm
MDC IDC MSMT LEADCHNL LV PACING THRESHOLD PULSEWIDTH: 0.4 ms
MDC IDC MSMT LEADCHNL RV PACING THRESHOLD AMPLITUDE: 0.4 V
MDC IDC MSMT LEADCHNL RV PACING THRESHOLD PULSEWIDTH: 0.4 ms
MDC IDC SET LEADCHNL LV SENSING SENSITIVITY: 1 mV
MDC IDC SET LEADCHNL RV PACING AMPLITUDE: 2.5 V
MDC IDC SET LEADCHNL RV PACING PULSEWIDTH: 0.4 ms
MDC IDC SET ZONE DETECTION INTERVAL: 333 ms
Pulse Gen Serial Number: 950230
Zone Setting Detection Interval: 286 ms

## 2015-06-26 NOTE — Progress Notes (Signed)
CRT-D device check in office. Thresholds and sensing consistent with previous device measurements. Lead impedance trends stable over time. Permanent AF + Warfarin. No ventricular arrhythmia episodes recorded. Patient bi-ventricularly pacing 100% of the time. Device programmed with appropriate safety margins. No changes made this session. Estimated longevity 8 years. Patient will follow up with the [facility] in 3 months and with [facility] in 02-2016.

## 2015-07-21 ENCOUNTER — Encounter: Payer: Self-pay | Admitting: Internal Medicine

## 2015-07-30 ENCOUNTER — Ambulatory Visit (INDEPENDENT_AMBULATORY_CARE_PROVIDER_SITE_OTHER): Payer: Commercial Managed Care - HMO | Admitting: *Deleted

## 2015-07-30 DIAGNOSIS — Z5181 Encounter for therapeutic drug level monitoring: Secondary | ICD-10-CM

## 2015-07-30 DIAGNOSIS — I4891 Unspecified atrial fibrillation: Secondary | ICD-10-CM | POA: Diagnosis not present

## 2015-07-30 LAB — POCT INR: INR: 2.4

## 2015-09-10 ENCOUNTER — Ambulatory Visit (INDEPENDENT_AMBULATORY_CARE_PROVIDER_SITE_OTHER): Payer: Commercial Managed Care - HMO | Admitting: *Deleted

## 2015-09-10 DIAGNOSIS — Z5181 Encounter for therapeutic drug level monitoring: Secondary | ICD-10-CM

## 2015-09-10 DIAGNOSIS — I4891 Unspecified atrial fibrillation: Secondary | ICD-10-CM

## 2015-09-10 LAB — POCT INR: INR: 2.2

## 2015-10-22 ENCOUNTER — Ambulatory Visit (INDEPENDENT_AMBULATORY_CARE_PROVIDER_SITE_OTHER): Payer: Commercial Managed Care - HMO | Admitting: Pharmacist

## 2015-10-22 DIAGNOSIS — I4891 Unspecified atrial fibrillation: Secondary | ICD-10-CM

## 2015-10-22 DIAGNOSIS — Z5181 Encounter for therapeutic drug level monitoring: Secondary | ICD-10-CM | POA: Diagnosis not present

## 2015-10-22 LAB — POCT INR: INR: 2.7

## 2015-12-10 ENCOUNTER — Ambulatory Visit (INDEPENDENT_AMBULATORY_CARE_PROVIDER_SITE_OTHER): Payer: Commercial Managed Care - HMO | Admitting: Pharmacist

## 2015-12-10 DIAGNOSIS — I4891 Unspecified atrial fibrillation: Secondary | ICD-10-CM

## 2015-12-10 DIAGNOSIS — Z5181 Encounter for therapeutic drug level monitoring: Secondary | ICD-10-CM | POA: Diagnosis not present

## 2015-12-10 LAB — POCT INR: INR: 1.8

## 2016-01-07 ENCOUNTER — Ambulatory Visit (INDEPENDENT_AMBULATORY_CARE_PROVIDER_SITE_OTHER): Payer: Commercial Managed Care - HMO | Admitting: *Deleted

## 2016-01-07 DIAGNOSIS — I4891 Unspecified atrial fibrillation: Secondary | ICD-10-CM | POA: Diagnosis not present

## 2016-01-07 DIAGNOSIS — Z5181 Encounter for therapeutic drug level monitoring: Secondary | ICD-10-CM | POA: Diagnosis not present

## 2016-01-07 LAB — POCT INR: INR: 2.3

## 2016-01-21 ENCOUNTER — Encounter: Payer: Self-pay | Admitting: Internal Medicine

## 2016-01-21 ENCOUNTER — Ambulatory Visit (INDEPENDENT_AMBULATORY_CARE_PROVIDER_SITE_OTHER): Payer: Commercial Managed Care - HMO | Admitting: Internal Medicine

## 2016-01-21 VITALS — BP 142/78 | HR 66 | Ht 72.0 in | Wt 175.0 lb

## 2016-01-21 DIAGNOSIS — I5022 Chronic systolic (congestive) heart failure: Secondary | ICD-10-CM

## 2016-01-21 NOTE — Patient Instructions (Signed)
Your physician wants you to follow-up in: 1 year with Dr. Lovena Le. You will receive a reminder letter in the mail two months in advance. If you don't receive a letter, please call our office to schedule the follow-up appointment.  Your physician recommends that you schedule a follow-up appointment in: 3 Months in the [place] physician recommends that you continue on your current medications as directed. Please refer to the Current Medication list given to you today.  If you need a refill on your cardiac medications before your next appointment, please call your pharmacy.  Thank you for choosing [facility]!

## 2016-01-21 NOTE — Progress Notes (Signed)
HPI [name] returns today for followup. He is a pleasant 60 yo man with an ICM, CHB, s/p BiV ICD, who has developed atrial fib/flutter in the interim. He is asymptomatic. He denies chest pain or sob. No edema or palpitations. He c/o numbness in his legs from neuropathy.  Allergies  Allergen Reactions  . Vancomycin Itching and Rash     Current Outpatient Prescriptions  Medication Sig Dispense Refill  . acetaminophen (TYLENOL) 500 MG tablet Take 1 tablet by mouth as needed.    [name] albuterol (PROVENTIL HFA;VENTOLIN HFA) 108 (90 BASE) MCG/ACT inhaler Inhale 2 puffs into the lungs every 6 (six) hours as needed. For shortness of breath    . albuterol (PROVENTIL) (2.5 MG/3ML) 0.083% nebulizer solution Take 2.5 mg by nebulization every 6 (six) hours as needed. For shortness of breath    . aspirin EC 81 MG tablet Take 1 tablet by mouth daily.    . carvedilol (COREG) 6.25 MG tablet Take 6.25 mg by mouth 2 (two) times daily with a meal.     . furosemide (LASIX) 40 MG tablet Take 40 mg by mouth daily.    [name] gabapentin (NEURONTIN) 300 MG capsule Take 300 mg by mouth at bedtime.    . insulin glargine (LANTUS) 100 UNIT/ML injection Inject 20 Units into the skin 2 (two) times daily.     [name] lisinopril (PRINIVIL) 10 MG tablet Take 1 tablet (10 mg total) by mouth daily. 30 tablet 12  . loratadine (CLARITIN) 10 MG tablet Take 10 mg by mouth daily. As needed    . magnesium oxide (MAG-OX) 400 MG tablet Take 400 mg by mouth 2 (two) times daily.    . metFORMIN (GLUCOPHAGE) 500 MG tablet Take 1,000 mg by mouth 2 (two) times daily with a meal.    . nitroGLYCERIN (NITROSTAT) 0.4 MG SL tablet Place 0.4 mg under the tongue every 5 (five) minutes x 3 doses as needed. For chest pain    . Omega-3 Fatty Acids (FISH OIL) 1000 MG CAPS Take 1,000 mg by mouth 2 (two) times daily.    . promethazine (PHENERGAN) 25 MG tablet Take one tablet by mouth 4 times daily as needed for nausea    . sertraline (ZOLOFT) 50 MG tablet  Take 50 mg by mouth daily.    . simvastatin (ZOCOR) 80 MG tablet Take 40 mg by mouth at bedtime.    [name] tiZANidine (ZANAFLEX) 4 MG tablet Take 4 mg by mouth 3 (three) times daily.    . traMADol (ULTRAM) 50 MG tablet Take 50 mg by mouth every 6 (six) hours as needed.    . warfarin (COUMADIN) 4 MG tablet Take 2 tablets daily except 1 1/2 tablets on Sundays, Tuesdays and Thursdays 180 tablet 3   No current facility-administered medications for this visit.     Past Medical History  Diagnosis Date  . COPD (chronic obstructive pulmonary disease) ([place])   . Arteriosclerotic cardiovascular disease (ASCVD)     Acute MI in 2006->  LAD stent, EF-37%  . Diabetes mellitus, type II ([place])     With peripheral neuropathy  . Colon cancer ([place]) 2006    Partial colectomy in 2006  . Hypertension   . Hyperlipidemia   . Carpal tunnel syndrome   . Tobacco abuse   . Edema     venous insufficiency  . Anemia, normocytic normochromic 2008    2008  . Pneumonia 01/2012    01/2012; subsequent admission for chest pain  .  H/O alcohol dependence ([place])   . Complete heart block ([place])     S/P ICD 02/09/13  . Coronary artery disease   . Myocardial infarction [facility])     Hx: of 2006  . Pacemaker   . Automatic implantable cardioverter-defibrillator in situ   . Shortness of breath     Hx: of   . Neuropathy in diabetes ([place])     Hx: of  . H/O hiatal hernia   . Arthritis     Hx: of in hands    ROS:   All systems reviewed and negative except as noted in the HPI.   Past Surgical History  Procedure Laterality Date  . Subtotal colectomy  09/01/2005    subtotal colectomy  . Coronary angioplasty with stent placement    . Ankle surgery      Right  . Inguinal hernia repair    . Colonoscopy  08/17/2005    Pancolonic diverticula/Multiple rectal polyps removed as described above. The larger of the two likely responsible for intermittent hematochezia/ Multiple polyps at the hepatic flexure resected with a snare./ Large  polypoid lesion growing out of the base of the cecum, just behind the  ileocecal valve. This was not felt to be amendable to endoscopic resection. Biopsied multiple times  . Flexible sigmoidoscopy    06/13/2006    Essentially normal residual rectum status post total colectomy. anastomosis at 25cm.  Focal area of abnormality adjacent to suture, as described above, likely not significant, suspect granulation tissue, biopsied.  Anastomosis otherwise appeared normal.  Distal terminal ileum appeared normal  . Biventricular icd placement  02/09/2013    Dr. Lovena Le  . Multiple extractions with alveoloplasty N/A 04/09/2013    Procedure: Extraction of tooth #'s 1,2,4,5,6,7,8,9,10,11,12,13,17,18,20,21,22,23,27,28, 29,30,31, and 32 with alveoloplasty.;  Surgeon: Lenn Cal, DDS;  Location: [place];  Service: Oral Surgery;  Laterality: N/A;  . Temporary pacemaker insertion Right 02/09/2013    Procedure: TEMPORARY PACEMAKER INSERTION;  Surgeon: Sinclair Grooms, MD;  Location: [facility] CATH LAB;  Service: Cardiovascular;  Laterality: Right;  . Permanent pacemaker insertion N/A 02/09/2013    Procedure: PERMANENT PACEMAKER INSERTION;  Surgeon: Evans Lance, MD;  Location: [facility] CATH LAB;  Service: Cardiovascular;  Laterality: N/A;  . Flexible sigmoidoscopy N/A 01/13/2015    Procedure: FLEXIBLE SIGMOIDOSCOPY;  Surgeon: Daneil Dolin, MD;  Location[facility];  Service: Endoscopy;  Laterality: N/A;  735am     Family History  Problem Relation Age of Onset  . Colon cancer Mother     Thos Zinter, ?>age 67.  [name] Heart disease Mother   . Heart disease Father   . Liver disease Neg Hx   . Hypertension Brother   . Heart disease Brother   . Diabetes Brother      Social History   Social History  . Marital Status: Married    Spouse Name: N/A  . Number of Children: 1  . Years of Education: N/A   Occupational History  . Parts Manager     Disabled   Social History Main Topics  . Smoking status: Current Every Day  Smoker -- 1.00 packs/day for 40 years    Types: Cigarettes  . Smokeless tobacco: Never Used  . Alcohol Use: No     Comment: Quit 2005 from a 6 pack a day use  . Drug Use: No  . Sexual Activity: No   Other Topics Concern  . Not on file   Social History Narrative  BP 142/78 mmHg  Pulse 66  Ht 6' (1.829 m)  Wt 175 lb (79.379 kg)  BMI 23.73 kg/m2  SpO2 92%  Physical Exam:  stable appearing middle aged man, NAD HEENT: Unremarkable Neck:  No JVD, no thyromegally Back:  No CVA tenderness Lungs:  Clear with no wheezes HEART:  Regular rate rhythm, no murmurs, no rubs, no clicks Abd:  soft, positive bowel sounds, no organomegally, no rebound, no guarding Ext:  2 plus pulses, no edema, no cyanosis, no clubbing Skin:  No rashes no nodules Neuro:  CN II through XII intact, motor grossly intact   DEVICE  Normal device function.  See PaceArt for details.   Assess/Plan: 1. Chronic systolic heart failure - his symptoms remain class 2. He will continue his current meds. I have encouraged him to increase his physical activity. 2. CAD - he denies anginal symptoms. He will continue his current meds. 3. ICD - his [place] Sci BiV ICD is working normally and has 7 more years of battery longevity [employer].D

## 2016-01-30 LAB — CUP PACEART INCLINIC DEVICE CHECK
Battery Remaining Longevity: 84 mo
Brady Statistic RA Percent Paced: 0 %
Date Time Interrogation Session: 20170428100331
HighPow Impedance: 80 Ohm
Implantable Lead Implant Date: 20140509
Implantable Lead Implant Date: 20140509
Implantable Lead Model: 293
Implantable Lead Model: 4136
Implantable Lead Model: 4555
Implantable Lead Serial Number: 118610
Implantable Lead Serial Number: 207706
Lead Channel Impedance Value: 685 Ohm
Lead Channel Impedance Value: 753 Ohm
Lead Channel Pacing Threshold Amplitude: 1 V
Lead Channel Pacing Threshold Pulse Width: 0.4 ms
Lead Channel Setting Pacing Amplitude: 2.5 V
Lead Channel Setting Pacing Amplitude: 2.5 V
Lead Channel Setting Pacing Pulse Width: 0.4 ms
MDC IDC LEAD IMPLANT DT: 20140509
MDC IDC LEAD LOCATION: 753858
MDC IDC LEAD LOCATION: 753859
MDC IDC LEAD LOCATION: 753860
MDC IDC LEAD SERIAL: 29354050
MDC IDC MSMT LEADCHNL RA IMPEDANCE VALUE: 637 Ohm
MDC IDC MSMT LEADCHNL RA SENSING INTR AMPL: 8.1 mV
MDC IDC MSMT LEADCHNL RV PACING THRESHOLD AMPLITUDE: 0.4 V
MDC IDC MSMT LEADCHNL RV PACING THRESHOLD PULSEWIDTH: 0.4 ms
MDC IDC SET LEADCHNL LV SENSING SENSITIVITY: 1 mV
MDC IDC SET LEADCHNL RV PACING PULSEWIDTH: 0.4 ms
MDC IDC SET LEADCHNL RV SENSING SENSITIVITY: 0.6 mV
MDC IDC STAT BRADY RV PERCENT PACED: 100 %
Pulse Gen Serial Number: 950230

## 2016-02-04 ENCOUNTER — Ambulatory Visit (INDEPENDENT_AMBULATORY_CARE_PROVIDER_SITE_OTHER): Payer: Commercial Managed Care - HMO | Admitting: *Deleted

## 2016-02-04 DIAGNOSIS — Z5181 Encounter for therapeutic drug level monitoring: Secondary | ICD-10-CM | POA: Diagnosis not present

## 2016-02-04 DIAGNOSIS — I4891 Unspecified atrial fibrillation: Secondary | ICD-10-CM | POA: Diagnosis not present

## 2016-02-04 LAB — POCT INR: INR: 2.9

## 2016-02-05 ENCOUNTER — Encounter: Payer: Self-pay | Admitting: Internal Medicine

## 2016-03-10 ENCOUNTER — Ambulatory Visit (INDEPENDENT_AMBULATORY_CARE_PROVIDER_SITE_OTHER): Payer: Commercial Managed Care - HMO | Admitting: *Deleted

## 2016-03-10 DIAGNOSIS — Z5181 Encounter for therapeutic drug level monitoring: Secondary | ICD-10-CM | POA: Diagnosis not present

## 2016-03-10 DIAGNOSIS — I4891 Unspecified atrial fibrillation: Secondary | ICD-10-CM | POA: Diagnosis not present

## 2016-03-10 LAB — POCT INR: INR: 2.1

## 2016-03-29 DIAGNOSIS — J449 Chronic obstructive pulmonary disease, unspecified: Secondary | ICD-10-CM | POA: Diagnosis not present

## 2016-03-29 DIAGNOSIS — I2510 Atherosclerotic heart disease of native coronary artery without angina pectoris: Secondary | ICD-10-CM | POA: Diagnosis not present

## 2016-03-29 DIAGNOSIS — I872 Venous insufficiency (chronic) (peripheral): Secondary | ICD-10-CM | POA: Diagnosis not present

## 2016-03-29 DIAGNOSIS — I10 Essential (primary) hypertension: Secondary | ICD-10-CM | POA: Diagnosis not present

## 2016-04-21 ENCOUNTER — Ambulatory Visit (INDEPENDENT_AMBULATORY_CARE_PROVIDER_SITE_OTHER): Payer: Commercial Managed Care - HMO | Admitting: *Deleted

## 2016-04-21 DIAGNOSIS — I4891 Unspecified atrial fibrillation: Secondary | ICD-10-CM

## 2016-04-21 DIAGNOSIS — Z5181 Encounter for therapeutic drug level monitoring: Secondary | ICD-10-CM | POA: Diagnosis not present

## 2016-04-21 LAB — POCT INR: INR: 2.3

## 2016-06-02 ENCOUNTER — Other Ambulatory Visit: Payer: Self-pay | Admitting: Internal Medicine

## 2016-06-14 ENCOUNTER — Ambulatory Visit (INDEPENDENT_AMBULATORY_CARE_PROVIDER_SITE_OTHER): Payer: Commercial Managed Care - HMO | Admitting: *Deleted

## 2016-06-14 DIAGNOSIS — Z5181 Encounter for therapeutic drug level monitoring: Secondary | ICD-10-CM

## 2016-06-14 DIAGNOSIS — I4891 Unspecified atrial fibrillation: Secondary | ICD-10-CM

## 2016-06-14 LAB — POCT INR: INR: 3.5

## 2016-07-06 ENCOUNTER — Encounter: Payer: Self-pay | Admitting: Adult Health

## 2016-07-06 ENCOUNTER — Ambulatory Visit (INDEPENDENT_AMBULATORY_CARE_PROVIDER_SITE_OTHER): Payer: Commercial Managed Care - HMO | Admitting: Adult Health

## 2016-07-06 ENCOUNTER — Ambulatory Visit (INDEPENDENT_AMBULATORY_CARE_PROVIDER_SITE_OTHER): Payer: Commercial Managed Care - HMO | Admitting: *Deleted

## 2016-07-06 VITALS — BP 150/74 | HR 61 | Ht 72.0 in | Wt 189.0 lb

## 2016-07-06 DIAGNOSIS — I4891 Unspecified atrial fibrillation: Secondary | ICD-10-CM

## 2016-07-06 DIAGNOSIS — I5022 Chronic systolic (congestive) heart failure: Secondary | ICD-10-CM

## 2016-07-06 DIAGNOSIS — Z5181 Encounter for therapeutic drug level monitoring: Secondary | ICD-10-CM

## 2016-07-06 LAB — POCT INR: INR: 3.8

## 2016-07-06 MED ORDER — POTASSIUM CHLORIDE CRYS ER 20 MEQ PO TBCR: 20.0000 meq | EXTENDED_RELEASE_TABLET | Freq: Two times a day (BID) | ORAL | 0 refills | Status: DC

## 2016-07-06 MED ORDER — METOLAZONE 2.5 MG PO TABS: ORAL_TABLET | ORAL | 0 refills | Status: DC

## 2016-07-06 NOTE — Progress Notes (Deleted)
Name: [name]    DOB: [birth year]  Age: 61 y.o.  MR#: [id number]       PCP:  Alonza Bogus, MD      Insurance: Payor: [employer] MEDICARE / Plan: [employer] THN/NTSP / Product Type: *No Product type* /   CC:   No chief complaint on file.   VS Vitals:   07/06/16 1351  BP: (!) 150/74  Pulse: 61  SpO2: 90%  Weight: 189 lb (85.7 kg)  Height: 6' (1.829 m)    Weights Current Weight  07/06/16 189 lb (85.7 kg)  01/21/16 175 lb (79.4 kg)  02/05/15 183 lb 6 oz (83.2 kg)    Blood Pressure  BP Readings from Last 3 Encounters:  07/06/16 (!) 150/74  01/21/16 (!) 142/78  02/05/15 130/70     Admit date:  (Not on file) Last encounter with RMR:  Visit date not found   Allergy Vancomycin  Current Outpatient Prescriptions  Medication Sig Dispense Refill  . albuterol (PROVENTIL HFA;VENTOLIN HFA) 108 (90 BASE) MCG/ACT inhaler Inhale 2 puffs into the lungs every 6 (six) hours as needed. For shortness of breath    . albuterol (PROVENTIL) (2.5 MG/3ML) 0.083% nebulizer solution Take 2.5 mg by nebulization every 6 (six) hours as needed. For shortness of breath    . aspirin EC 81 MG tablet Take 1 tablet by mouth daily.    . carvedilol (COREG) 6.25 MG tablet Take 6.25 mg by mouth 2 (two) times daily with a meal.     . furosemide (LASIX) 40 MG tablet Take 40 mg by mouth daily.    [name] gabapentin (NEURONTIN) 300 MG capsule Take 300 mg by mouth at bedtime.    . insulin glargine (LANTUS) 100 UNIT/ML injection Inject 20 Units into the skin 2 (two) times daily.     [name] lisinopril (PRINIVIL) 10 MG tablet Take 1 tablet (10 mg total) by mouth daily. 30 tablet 12  . loratadine (CLARITIN) 10 MG tablet Take 10 mg by mouth daily. As needed    . magnesium oxide (MAG-OX) 400 MG tablet Take 400 mg by mouth 2 (two) times daily.    . metFORMIN (GLUCOPHAGE) 500 MG tablet Take 1,000 mg by mouth 2 (two) times daily with a meal.    . nitroGLYCERIN (NITROSTAT) 0.4 MG SL tablet Place 0.4 mg under the tongue every  5 (five) minutes x 3 doses as needed. For chest pain    . promethazine (PHENERGAN) 25 MG tablet Take one tablet by mouth 4 times daily as needed for nausea    . sertraline (ZOLOFT) 50 MG tablet Take 50 mg by mouth daily.    . simvastatin (ZOCOR) 80 MG tablet Take 40 mg by mouth at bedtime.    [name] tiZANidine (ZANAFLEX) 4 MG tablet Take 4 mg by mouth 3 (three) times daily.    . traMADol (ULTRAM) 50 MG tablet Take 50 mg by mouth every 6 (six) hours as needed.    . warfarin (COUMADIN) 4 MG tablet TAKE 2 TABLETS EVERY DAY  EXCEPT TAKE 1 AND 1/2 TABLETS  ON  SUNDAYS,  TUESDAYS AND THURSDAYS 163 tablet 3  . acetaminophen (TYLENOL) 500 MG tablet Take 1 tablet by mouth as needed.     No current facility-administered medications for this visit.     Discontinued Meds:    Medications Discontinued During This Encounter  Medication Reason  . Omega-3 Fatty Acids (FISH OIL) 1000 MG CAPS Error    Patient Active Problem List  Diagnosis Date Noted  . History of colon cancer 12/25/2014  . Abdominal pain 12/25/2014  . Encounter for therapeutic drug monitoring 02/11/2014  . Atrial fibrillation ([place]) 02/04/2014  . Dizziness 01/26/2014  . Chronic systolic CHF (congestive heart failure) ([place]) 01/26/2014  . Ataxia 01/26/2014  . Biventricular implantable cardioverter-defibrillator in situ 03/16/2013  . COPD (chronic obstructive pulmonary disease) ([place])   . Arteriosclerotic cardiovascular disease (ASCVD)   . Diabetes mellitus, type II ([place])   . Colon cancer ([place])   . Hypertension   . Hyperlipidemia   . Tobacco abuse   . Anemia, normocytic normochromic   . Chronic diarrhea 07/10/2012    LABS    Component Value Date/Time   NA 143 01/26/2014 0650   NA 143 01/26/2014 0626   NA 136 04/05/2013 1414   K 4.9 01/26/2014 0650   K 5.0 01/26/2014 0626   K 5.3 (H) 04/05/2013 1414   CL 104 01/26/2014 0650   CL 104 01/26/2014 0626   CL 102 04/05/2013 1414   CO2 29 01/26/2014 0626   CO2 25 04/05/2013 1414    CO2 26 02/10/2013 0350   GLUCOSE 197 (H) 01/26/2014 0650   GLUCOSE 207 (H) 01/26/2014 0626   GLUCOSE 205 (H) 04/05/2013 1414   BUN 8 01/26/2014 0650   BUN 9 01/26/2014 0626   BUN 27 (H) 04/05/2013 1414   CREATININE 1.00 01/26/2014 0650   CREATININE 0.88 01/26/2014 0626   CREATININE 1.13 04/05/2013 1414   CALCIUM 9.4 01/26/2014 0626   CALCIUM 9.1 04/05/2013 1414   CALCIUM 8.6 02/10/2013 0350   GFRNONAA >90 01/26/2014 0626   GFRNONAA 71 (L) 04/05/2013 1414   GFRNONAA 82 (L) 02/10/2013 0350   GFRAA >90 01/26/2014 0626   GFRAA 82 (L) 04/05/2013 1414   GFRAA >90 02/10/2013 0350   CMP     Component Value Date/Time   NA 143 01/26/2014 0650   K 4.9 01/26/2014 0650   CL 104 01/26/2014 0650   CO2 29 01/26/2014 0626   GLUCOSE 197 (H) 01/26/2014 0650   BUN 8 01/26/2014 0650   CREATININE 1.00 01/26/2014 0650   CALCIUM 9.4 01/26/2014 0626   PROT 7.6 01/26/2014 0626   ALBUMIN 3.9 01/26/2014 0626   AST 24 01/26/2014 0626   ALT 12 01/26/2014 0626   ALKPHOS 126 (H) 01/26/2014 0626   BILITOT 0.2 (L) 01/26/2014 0626   GFRNONAA >90 01/26/2014 0626   GFRAA >90 01/26/2014 0626       Component Value Date/Time   WBC 6.4 01/26/2014 0626   WBC 8.7 04/05/2013 1414   WBC 9.2 02/10/2013 0350   HGB 13.3 01/26/2014 0650   HGB 12.8 (L) 01/26/2014 0626   HGB 11.0 (L) 04/05/2013 1414   HCT 39.0 01/26/2014 0650   HCT 39.0 01/26/2014 0626   HCT 32.6 (L) 04/05/2013 1414   MCV 90.7 01/26/2014 0626   MCV 88.1 04/05/2013 1414   MCV 89.1 02/10/2013 0350    Lipid Panel     Component Value Date/Time   CHOL 108 01/27/2014 0623   TRIG 100 01/27/2014 0623   HDL 46 01/27/2014 0623   CHOLHDL 2.3 01/27/2014 0623   VLDL 20 01/27/2014 0623   LDLCALC 42 01/27/2014 0623    ABG    Component Value Date/Time   PHART 7.319 (L) 03/28/2010 0528   PCO2ART 44.9 03/28/2010 0528   PO2ART 67.2 (L) 03/28/2010 0528   HCO3 22.3 03/28/2010 0528   TCO2 28 01/26/2014 0650   ACIDBASEDEF 2.8 (H) 03/28/2010 [id number]  O2SAT 93.2 03/28/2010 0528     Lab Results  Component Value Date   TSH 3.144 ***Test methodology is 3rd generation TSH*** 03/26/2010   BNP (last 3 results) No results for input(s): BNP in the last 8760 hours.  ProBNP (last 3 results) No results for input(s): PROBNP in the last 8760 hours.  Cardiac Panel (last 3 results) No results for input(s): CKTOTAL, CKMB, TROPONINI, RELINDX in the last 72 hours.  Iron/TIBC/Ferritin/ %Sat No results found for: IRON, TIBC, FERRITIN, IRONPCTSAT   EKG Orders placed or performed in visit on 07/06/16  . EKG 12-Lead     Prior Assessment and Plan Problem List as of 07/06/2016 Reviewed: 01/21/2016 10:13 AM by Cristopher Peru, MD     Cardiovascular and Mediastinum   Arteriosclerotic cardiovascular disease (ASCVD)   Last Assessment & Plan 03/16/2013 Office Visit Written 03/16/2013  1:47 PM by Lendon Colonel, NP    He is without cardiac complaints. He will continue on risk stratification, continue Plavix until 5 days prior to dental extraction. Will see him in one month for ongoing assessment      Hypertension   Chronic systolic CHF (congestive heart failure) [facility])   Last Assessment & Plan 02/05/2015 Office Visit Written 02/05/2015 11:11 AM by Evans Lance, MD    His symptoms remain class 2. He will continue his current meds.      Atrial fibrillation [facility])   Last Assessment & Plan 02/05/2015 Office Visit Written 02/05/2015 11:10 AM by Evans Lance, MD    His ventricular rate is well controlled. He will continue his current meds.         Respiratory   COPD (chronic obstructive pulmonary disease) ([facility])     Digestive   Chronic diarrhea   Colon cancer ([facility])     Endocrine   Diabetes mellitus, type II ([place])     Other   Hyperlipidemia   Tobacco abuse   Anemia, normocytic normochromic   Last Assessment & Plan 12/25/2014 Office Visit Written 12/25/2014  1:57 PM by Carlis Stable, NP    Patient with abdominal pain for the past 3 months, normocytic  anemia. Family history of colon cancer and personal history of colon cancer 2006 with total colectomy. Last colon endoscopic exam 2008. Denies other red flag/warning signs/symptoms like bleeding, weight loss, and others. Will plan for repeat flexible sigmoidoscopy.  Proceed with flexible sigmoidoscopy with Dr. Gala Romney in near future: the risks, benefits, and alternatives have been discussed with the patient in detail. The patient states understanding and desires to proceed.      Biventricular implantable cardioverter-defibrillator in situ   Last Assessment & Plan 02/05/2015 Office Visit Written 02/05/2015 11:11 AM by Evans Lance, MD    His BiV ICD is reprogrammed today from DDD to VVI as he is chronically in atrial fib. Will recheck in several months.       Dizziness   Ataxia   Encounter for therapeutic drug monitoring   History of colon cancer   Last Assessment & Plan 12/25/2014 Office Visit Written 12/25/2014  1:57 PM by Carlis Stable, NP    Patient with abdominal pain for the past 3 months, normocytic anemia. Family history of colon cancer and personal history of colon cancer 2006 with total colectomy. Last colon endoscopic exam 2008. Denies other red flag/warning signs/symptoms like bleeding, weight loss, and others. Will plan for repeat flexible sigmoidoscopy.  Proceed with flexible sigmoidoscopy with Dr. Gala Romney in near future: the risks, benefits, and alternatives have  been discussed with the patient in detail. The patient states understanding and desires to proceed.      Abdominal pain   Last Assessment & Plan 12/25/2014 Office Visit Written 12/25/2014  1:57 PM by Carlis Stable, NP    Patient with abdominal pain for the past 3 months, normocytic anemia. Family history of colon cancer and personal history of colon cancer 2006 with total colectomy. Last colon endoscopic exam 2008. Denies other red flag/warning signs/symptoms like bleeding, weight loss, and others. Will plan for repeat flexible  sigmoidoscopy.  Proceed with flexible sigmoidoscopy with Dr. Gala Romney in near future: the risks, benefits, and alternatives have been discussed with the patient in detail. The patient states understanding and desires to proceed.           Imaging: No results found.

## 2016-07-06 NOTE — Progress Notes (Signed)
Cardiology Office Note   Date:  07/06/2016   ID:  [name] 03-29-1956, MRN [id number]  PCP:  Alonza Bogus, MD  Cardiologist: Bryna Colander, NP   Chief Complaint  Patient presents with  . Cardiomyopathy      History of Present Illness: [name] is a 60 y.o. male who presents for ongoing assessment and management of ischemic cardiopathy, complete heart block, with IV ICD in situ, atrial flutter, chronic systolic heart failure.. The patient's last seen by Dr. Lovena Le in April 2017 was clinically stable with no changes in his medical regimen.  He is a with complaints of fluid retention, weight gain, lower extremity edema and abdominal distention. He also is having complaints of PND, dyspnea on exertion, his weight has gone up 14 pounds since being seen last and he is concerned of doubt his breathing status. Symptoms have been increasing over the last 2 weeks. He admits to eating a lot of salty foods over the last several weeks and including eating out.   Past Medical History:  Diagnosis Date  . Anemia, normocytic normochromic 2008   2008  . Arteriosclerotic cardiovascular disease (ASCVD)    Acute MI in 2006->  LAD stent, EF-37%  . Arthritis    Hx: of in hands  . Automatic implantable cardioverter-defibrillator in situ   . Carpal tunnel syndrome   . Colon cancer ([place]) 2006   Partial colectomy in 2006  . Complete heart block ([place])    S/P ICD 02/09/13  . COPD (chronic obstructive pulmonary disease) ([place])   . Coronary artery disease   . Diabetes mellitus, type II ([place])    With peripheral neuropathy  . Edema    venous insufficiency  . H/O alcohol dependence ([place])   . H/O hiatal hernia   . Hyperlipidemia   . Hypertension   . Myocardial infarction    Hx: of 2006  . Neuropathy in diabetes ([place])    Hx: of  . Pacemaker   . Pneumonia 01/2012   01/2012; subsequent admission for chest pain  . Shortness of breath    Hx: of   . Tobacco abuse     Past  Surgical History:  Procedure Laterality Date  . ANKLE SURGERY     Right  . Biventricular ICD Placement  02/09/2013   Dr. Lovena Le  . COLONOSCOPY  08/17/2005   Pancolonic diverticula/Multiple rectal polyps removed as described above. The larger of the two likely responsible for intermittent hematochezia/ Multiple polyps at the hepatic flexure resected with a snare./ Large polypoid lesion growing out of the base of the cecum, just behind the  ileocecal valve. This was not felt to be amendable to endoscopic resection. Biopsied multiple times  . CORONARY ANGIOPLASTY WITH STENT PLACEMENT    . FLEXIBLE SIGMOIDOSCOPY    06/13/2006   Essentially normal residual rectum status post total colectomy. anastomosis at 25cm.  Focal area of abnormality adjacent to suture, as described above, likely not significant, suspect granulation tissue, biopsied.  Anastomosis otherwise appeared normal.  Distal terminal ileum appeared normal  . FLEXIBLE SIGMOIDOSCOPY N/A 01/13/2015   Procedure: FLEXIBLE SIGMOIDOSCOPY;  Surgeon: Daneil Dolin, MD;  [facility];  Service: Endoscopy;  Laterality: N/A;  735am  . INGUINAL HERNIA REPAIR    . MULTIPLE EXTRACTIONS WITH ALVEOLOPLASTY N/A 04/09/2013   Procedure: Extraction of tooth #'s 1,2,4,5,6,7,8,9,10,11,12,13,17,18,20,21,22,23,27,28, 29,30,31, and 32 with alveoloplasty.;  Surgeon: Lenn Cal, DDS;  Location: [facility];  Service: Oral Surgery;  Laterality: N/A;  .  PERMANENT PACEMAKER INSERTION N/A 02/09/2013   Procedure: PERMANENT PACEMAKER INSERTION;  Surgeon: Evans Lance, MD;  Location: [facility] CATH LAB;  Service: Cardiovascular;  Laterality: N/A;  . SUBTOTAL COLECTOMY  09/01/2005   subtotal colectomy  . TEMPORARY PACEMAKER INSERTION Right 02/09/2013   Procedure: TEMPORARY PACEMAKER INSERTION;  Surgeon: Sinclair Grooms, MD;  Location: [facility] CATH LAB;  Service: Cardiovascular;  Laterality: Right;     Current Outpatient Prescriptions  Medication Sig Dispense Refill  .  albuterol (PROVENTIL HFA;VENTOLIN HFA) 108 (90 BASE) MCG/ACT inhaler Inhale 2 puffs into the lungs every 6 (six) hours as needed. For shortness of breath    . albuterol (PROVENTIL) (2.5 MG/3ML) 0.083% nebulizer solution Take 2.5 mg by nebulization every 6 (six) hours as needed. For shortness of breath    . aspirin EC 81 MG tablet Take 1 tablet by mouth daily.    . carvedilol (COREG) 6.25 MG tablet Take 6.25 mg by mouth 2 (two) times daily with a meal.     . furosemide (LASIX) 40 MG tablet Take 40 mg by mouth daily.    [name] gabapentin (NEURONTIN) 300 MG capsule Take 300 mg by mouth at bedtime.    . insulin glargine (LANTUS) 100 UNIT/ML injection Inject 20 Units into the skin 2 (two) times daily.     [name] lisinopril (PRINIVIL) 10 MG tablet Take 1 tablet (10 mg total) by mouth daily. 30 tablet 12  . loratadine (CLARITIN) 10 MG tablet Take 10 mg by mouth daily. As needed    . magnesium oxide (MAG-OX) 400 MG tablet Take 400 mg by mouth 2 (two) times daily.    . metFORMIN (GLUCOPHAGE) 500 MG tablet Take 1,000 mg by mouth 2 (two) times daily with a meal.    . nitroGLYCERIN (NITROSTAT) 0.4 MG SL tablet Place 0.4 mg under the tongue every 5 (five) minutes x 3 doses as needed. For chest pain    . promethazine (PHENERGAN) 25 MG tablet Take one tablet by mouth 4 times daily as needed for nausea    . sertraline (ZOLOFT) 50 MG tablet Take 50 mg by mouth daily.    . simvastatin (ZOCOR) 80 MG tablet Take 40 mg by mouth at bedtime.    [name] tiZANidine (ZANAFLEX) 4 MG tablet Take 4 mg by mouth 3 (three) times daily.    . traMADol (ULTRAM) 50 MG tablet Take 50 mg by mouth every 6 (six) hours as needed.    . warfarin (COUMADIN) 4 MG tablet TAKE 2 TABLETS EVERY DAY  EXCEPT TAKE 1 AND 1/2 TABLETS  ON  SUNDAYS,  TUESDAYS AND THURSDAYS 163 tablet 3  . acetaminophen (TYLENOL) 500 MG tablet Take 1 tablet by mouth as needed.    . metolazone (ZAROXOLYN) 2.5 MG tablet Take 15 minutes prior to taking Lasix 1 tablet 0  . potassium  chloride SA (K-DUR,KLOR-CON) 20 MEQ tablet Take 1 tablet (20 mEq total) by mouth 2 (two) times daily. 10 tablet 0   No current facility-administered medications for this visit.     Allergies:   Vancomycin    Social History:  The patient  reports that he has been smoking Cigarettes.  He has a 40.00 pack-year smoking history. He has never used smokeless tobacco. He reports that he does not drink alcohol or use drugs.   Family History:  The patient's family history includes Colon cancer in his mother; Diabetes in his brother; Heart disease in his brother, father, and mother; Hypertension in his brother.  ROS: All other systems are reviewed and negative. Unless otherwise mentioned in H&P    PHYSICAL EXAM: VS:  BP (!) 150/74   Pulse 61   Ht 6' (1.829 m)   Wt 189 lb (85.7 kg)   SpO2 90%   BMI 25.63 kg/m  , BMI Body mass index is 25.63 kg/m. GEN: Well nourished, well developed, in no acute distress  HEENT: normal  Neck: no JVD, carotid bruits, or masses Cardiac: RRR; no murmurs, rubs, or gallops, 2+ pretibial edema to the knees.  Respiratory:  Bibasilar crackles without wheezes  up to the right middle lobe GI: soft, nontender, mild distention distended, + BS MS: no deformity or atrophy  Skin: warm and dry, no rash Neuro:  Strength and sensation are intact Psych: euthymic mood, full affect   Recent Labs: No results found for requested labs within last 8760 hours.    Lipid Panel    Component Value Date/Time   CHOL 108 01/27/2014 0623   TRIG 100 01/27/2014 0623   HDL 46 01/27/2014 0623   CHOLHDL 2.3 01/27/2014 0623   VLDL 20 01/27/2014 0623   LDLCALC 42 01/27/2014 0623      Wt Readings from Last 3 Encounters:  07/06/16 189 lb (85.7 kg)  01/21/16 175 lb (79.4 kg)  02/05/15 183 lb 6 oz (83.2 kg)     ASSESSMENT AND PLAN:  1. Acute systolic heart failure: Likely related to dietary indiscretion. He's gained approximately 14 pounds with worsening dyspnea on exertion,  PND, abdominal distention, lower extremity edema. Lab chest x-ray completed, we'll increase his Lasix to 40 mg twice a day with potassium 20 once twice a day. He will be given one dose of metolazone 2.5 mg to take in the morning times one. He will follow-up BMET in 3 days. We'll see him again in one week.  He's been advised if he continues to gain weight or does not have any results from diuresis he may need to be admitted for IV diuresis due to bowel edema which may not allow for bioavailability of by mouth meds.  2. ICD in situ. Continue to follow Dr. Lovena Le and have ICD remotely checked per protocol.  3. History of atrial fibrillation: Heart rate is currently controlled. He will continue carvedilol 6.25 mg twice a day and warfarin. He is followed in the [place] office for INR checks.  Current medicines are reviewed at length with the patient today.    Labs/ tests ordered today include: BMET and CXR  Orders Placed This Encounter  Procedures  . DG Chest 2 View  . Basic Metabolic Panel (BMET)  . EKG 12-Lead     Disposition:   FU with One week.  Signed, Jory Sims, NP  07/06/2016 2:58 PM    [facility] [address] [place], [place] [address] Phone: [phone]; Fax: [phone]

## 2016-07-06 NOTE — Patient Instructions (Signed)
Your physician recommends that you schedule a follow-up appointment on Monday with Jory Sims, NP.   Your physician has recommended you make the following change in your medication:   Take Lasix 40 mg Two Times Daily for 3 Days. Starting today. Resume normal dose on Friday.   Take Potassium 20 mEq Two times daily while increasing Lasix.   Take Metolazone 2.5mg  15 minutes prior to taking Lasix.   Have Chest X-Ray done today.  Your physician recommends that you return for lab work on Friday. ( BMET)   If you need a refill on your cardiac medications before your next appointment, please call your pharmacy.  Thank you for choosing [facility]!

## 2016-07-07 ENCOUNTER — Ambulatory Visit (HOSPITAL_COMMUNITY)
Admission: RE | Admit: 2016-07-07 | Discharge: 2016-07-07 | Disposition: A | Discharge status: Home / Self Care | Payer: Commercial Managed Care - HMO | Source: Ambulatory Visit | Attending: Adult Health | Admitting: Adult Health

## 2016-07-07 DIAGNOSIS — I5022 Chronic systolic (congestive) heart failure: Secondary | ICD-10-CM | POA: Diagnosis not present

## 2016-07-07 DIAGNOSIS — I517 Cardiomegaly: Secondary | ICD-10-CM | POA: Insufficient documentation

## 2016-07-07 DIAGNOSIS — J449 Chronic obstructive pulmonary disease, unspecified: Secondary | ICD-10-CM | POA: Diagnosis not present

## 2016-07-07 DIAGNOSIS — R05 Cough: Secondary | ICD-10-CM | POA: Diagnosis not present

## 2016-07-07 DIAGNOSIS — R0602 Shortness of breath: Secondary | ICD-10-CM | POA: Diagnosis not present

## 2016-07-07 DIAGNOSIS — J90 Pleural effusion, not elsewhere classified: Secondary | ICD-10-CM | POA: Insufficient documentation

## 2016-07-08 ENCOUNTER — Ambulatory Visit (INDEPENDENT_AMBULATORY_CARE_PROVIDER_SITE_OTHER): Payer: Commercial Managed Care - HMO | Admitting: *Deleted

## 2016-07-08 ENCOUNTER — Telehealth: Payer: Self-pay | Admitting: *Deleted

## 2016-07-08 DIAGNOSIS — Z95810 Presence of automatic (implantable) cardiac defibrillator: Secondary | ICD-10-CM

## 2016-07-08 NOTE — Telephone Encounter (Signed)
Called patient with test results. No answer. Left message to call back.  

## 2016-07-08 NOTE — Telephone Encounter (Signed)
-----   Message from Lendon Colonel, NP sent at 07/08/2016  6:58 AM EDT ----- Please have him see his PCP. He has evidence of pneumonia on CXR.  May need antibiotics or ER evaluation of he cannot get into see PCP.

## 2016-07-09 ENCOUNTER — Other Ambulatory Visit (HOSPITAL_COMMUNITY)
Admission: RE | Admit: 2016-07-09 | Discharge: 2016-07-09 | Disposition: A | Discharge status: Home / Self Care | Payer: Commercial Managed Care - HMO | Source: Ambulatory Visit | Attending: Adult Health | Admitting: Adult Health

## 2016-07-09 DIAGNOSIS — I4891 Unspecified atrial fibrillation: Secondary | ICD-10-CM | POA: Diagnosis not present

## 2016-07-09 LAB — BASIC METABOLIC PANEL
Anion gap: 5 (ref 5–15)
BUN: 17 mg/dL (ref 6–20)
CALCIUM: 8.7 mg/dL — AB (ref 8.9–10.3)
CO2: 29 mmol/L (ref 22–32)
CREATININE: 1.13 mg/dL (ref 0.61–1.24)
Chloride: 104 mmol/L (ref 101–111)
GFR calc Af Amer: >60 mL/min (ref >60)
GLUCOSE: 117 mg/dL — AB (ref 65–99)
POTASSIUM: 4.8 mmol/L (ref 3.5–5.1)
SODIUM: 138 mmol/L (ref 135–145)

## 2016-07-09 NOTE — Progress Notes (Signed)
CRT-D device check in office. Thresholds and sensing consistent with previous device measurements. Lead impedance trends stable over time.  2 ventricular arrhythmia episodes recorded, NSVT. Patient bi-ventricularly pacing 100% of the time. Device programmed with appropriate safety margins. Heart failure diagnostics reviewed and trends are stable for patient. No changes made this session. Estimated longevity 30yrs. ROV with GT in 51mo

## 2016-07-12 ENCOUNTER — Ambulatory Visit (INDEPENDENT_AMBULATORY_CARE_PROVIDER_SITE_OTHER): Payer: Commercial Managed Care - HMO | Admitting: Adult Health

## 2016-07-12 ENCOUNTER — Encounter: Payer: Self-pay | Admitting: Adult Health

## 2016-07-12 VITALS — BP 110/60 | HR 57 | Ht 72.0 in | Wt 188.0 lb

## 2016-07-12 DIAGNOSIS — I5022 Chronic systolic (congestive) heart failure: Secondary | ICD-10-CM | POA: Diagnosis not present

## 2016-07-12 DIAGNOSIS — Z79899 Other long term (current) drug therapy: Secondary | ICD-10-CM | POA: Diagnosis not present

## 2016-07-12 DIAGNOSIS — I4891 Unspecified atrial fibrillation: Secondary | ICD-10-CM

## 2016-07-12 DIAGNOSIS — I422 Other hypertrophic cardiomyopathy: Secondary | ICD-10-CM

## 2016-07-12 LAB — CUP PACEART INCLINIC DEVICE CHECK
HighPow Impedance: 53 Ohm
HighPow Impedance: 73 Ohm
Implantable Lead Implant Date: 20140509
Implantable Lead Implant Date: 20140509
Implantable Lead Location: 753860
Implantable Lead Serial Number: 29354050
Lead Channel Impedance Value: 576 Ohm
Lead Channel Impedance Value: 862 Ohm
Lead Channel Pacing Threshold Amplitude: 0.5 V
Lead Channel Pacing Threshold Pulse Width: 0.4 ms
Lead Channel Pacing Threshold Pulse Width: 0.4 ms
Lead Channel Sensing Intrinsic Amplitude: 5.6 mV
Lead Channel Setting Pacing Amplitude: 2.5 V
Lead Channel Setting Pacing Amplitude: 2.5 V
Lead Channel Setting Pacing Pulse Width: 0.4 ms
Lead Channel Setting Sensing Sensitivity: 1 mV
MDC IDC LEAD IMPLANT DT: 20140509
MDC IDC LEAD LOCATION: 753858
MDC IDC LEAD LOCATION: 753859
MDC IDC LEAD MODEL: 293
MDC IDC LEAD MODEL: 4136
MDC IDC LEAD MODEL: 4555
MDC IDC LEAD SERIAL: 118610
MDC IDC LEAD SERIAL: 207706
MDC IDC MSMT LEADCHNL LV PACING THRESHOLD AMPLITUDE: 1 V
MDC IDC MSMT LEADCHNL RV IMPEDANCE VALUE: 595 Ohm
MDC IDC PG SERIAL: 950230
MDC IDC SESS DTM: 20171005040000
MDC IDC SET LEADCHNL RV PACING PULSEWIDTH: 0.4 ms
MDC IDC SET LEADCHNL RV SENSING SENSITIVITY: 0.6 mV

## 2016-07-12 MED ORDER — FUROSEMIDE 40 MG PO TABS: ORAL_TABLET | ORAL | 3 refills | Status: DC

## 2016-07-12 NOTE — Progress Notes (Deleted)
Cardiology Office Note   Date:  07/12/2016   ID:  VOID WORTHAM, DOB 01/07/1956, MRN [id number]  PCP:  Alonza Bogus, MD  Cardiologist:  Pearlie Oyster, LPN   No chief complaint on file.     History of Present Illness: [name] is a 60 y.o. male who presents for for ongoing assessment and management of ischemic cardiopathy, complete heart block, with IV ICD in situ, atrial flutter, chronic systolic heart failure.Marland KitchenHis weight had  increased, and thereforee his Lasix was increased to 40 mg twice a day with potassium 20 once twice a day. He will be given one dose of metolazone 2.5 mg to take in the morning times one. He will follow-up BMET in 3 days. We'll see him again in one week.  He's been advised if he continues to gain weight or does not have any results from diuresis he may need to be admitted for IV diuresis due to bowel edema which may not allow for bioavailability of by mouth meds.  He states that he hasn't lost any weight, but he is feeling better, his abdominal distention, he continues to have dependent lower extremity edema. His weight has been unchanged. He did state when he was taking extra doses of Lasix he had better diuresis but when he returned to her normal doses the fluid began to increase.  Past Medical History:  Diagnosis Date  . Anemia, normocytic normochromic 2008   2008  . Arteriosclerotic cardiovascular disease (ASCVD)    Acute MI in 2006->  LAD stent, EF-37%  . Arthritis    Hx: of in hands  . Automatic implantable cardioverter-defibrillator in situ   . Carpal tunnel syndrome   . Colon cancer ([place]) 2006   Partial colectomy in 2006  . Complete heart block ([place])    S/P ICD 02/09/13  . COPD (chronic obstructive pulmonary disease) ([place])   . Coronary artery disease   . Diabetes mellitus, type II ([place])    With peripheral neuropathy  . Edema    venous insufficiency  . H/O alcohol dependence ([place])   . H/O hiatal hernia   . Hyperlipidemia   .  Hypertension   . Myocardial infarction    Hx: of 2006  . Neuropathy in diabetes ([place])    Hx: of  . Pacemaker   . Pneumonia 01/2012   01/2012; subsequent admission for chest pain  . Shortness of breath    Hx: of   . Tobacco abuse     Past Surgical History:  Procedure Laterality Date  . ANKLE SURGERY     Right  . Biventricular ICD Placement  02/09/2013   Dr. Lovena Le  . COLONOSCOPY  08/17/2005   Pancolonic diverticula/Multiple rectal polyps removed as described above. The larger of the two likely responsible for intermittent hematochezia/ Multiple polyps at the hepatic flexure resected with a snare./ Large polypoid lesion growing out of the base of the cecum, just behind the  ileocecal valve. This was not felt to be amendable to endoscopic resection. Biopsied multiple times  . CORONARY ANGIOPLASTY WITH STENT PLACEMENT    . FLEXIBLE SIGMOIDOSCOPY    06/13/2006   Essentially normal residual rectum status post total colectomy. anastomosis at 25cm.  Focal area of abnormality adjacent to suture, as described above, likely not significant, suspect granulation tissue, biopsied.  Anastomosis otherwise appeared normal.  Distal terminal ileum appeared normal  . FLEXIBLE SIGMOIDOSCOPY N/A 01/13/2015   Procedure: FLEXIBLE SIGMOIDOSCOPY;  Surgeon: Daneil Dolin, MD;  [facility];  Service: Endoscopy;  Laterality: N/A;  735am  . INGUINAL HERNIA REPAIR    . MULTIPLE EXTRACTIONS WITH ALVEOLOPLASTY N/A 04/09/2013   Procedure: Extraction of tooth #'s 1,2,4,5,6,7,8,9,10,11,12,13,17,18,20,21,22,23,27,28, 29,30,31, and 32 with alveoloplasty.;  Surgeon: Lenn Cal, DDS;  Location: [facility];  Service: Oral Surgery;  Laterality: N/A;  . PERMANENT PACEMAKER INSERTION N/A 02/09/2013   Procedure: PERMANENT PACEMAKER INSERTION;  Surgeon: Evans Lance, MD;  Location: [facility] CATH LAB;  Service: Cardiovascular;  Laterality: N/A;  . SUBTOTAL COLECTOMY  09/01/2005   subtotal colectomy  . TEMPORARY PACEMAKER  INSERTION Right 02/09/2013   Procedure: TEMPORARY PACEMAKER INSERTION;  Surgeon: Sinclair Grooms, MD;  [facility] CATH LAB;  Service: Cardiovascular;  Laterality: Right;     Current Outpatient Prescriptions  Medication Sig Dispense Refill  . acetaminophen (TYLENOL) 500 MG tablet Take 1 tablet by mouth as needed.    [name] albuterol (PROVENTIL HFA;VENTOLIN HFA) 108 (90 BASE) MCG/ACT inhaler Inhale 2 puffs into the lungs every 6 (six) hours as needed. For shortness of breath    . albuterol (PROVENTIL) (2.5 MG/3ML) 0.083% nebulizer solution Take 2.5 mg by nebulization every 6 (six) hours as needed. For shortness of breath    . aspirin EC 81 MG tablet Take 1 tablet by mouth daily.    . carvedilol (COREG) 6.25 MG tablet Take 6.25 mg by mouth 2 (two) times daily with a meal.     . gabapentin (NEURONTIN) 300 MG capsule Take 300 mg by mouth at bedtime.    . insulin glargine (LANTUS) 100 UNIT/ML injection Inject 20 Units into the skin 2 (two) times daily.     [name] lisinopril (PRINIVIL) 10 MG tablet Take 1 tablet (10 mg total) by mouth daily. 30 tablet 12  . loratadine (CLARITIN) 10 MG tablet Take 10 mg by mouth daily. As needed    . magnesium oxide (MAG-OX) 400 MG tablet Take 400 mg by mouth 2 (two) times daily.    . metFORMIN (GLUCOPHAGE) 500 MG tablet Take 1,000 mg by mouth 2 (two) times daily with a meal.    . metolazone (ZAROXOLYN) 2.5 MG tablet Take 15 minutes prior to taking Lasix 1 tablet 0  . nitroGLYCERIN (NITROSTAT) 0.4 MG SL tablet Place 0.4 mg under the tongue every 5 (five) minutes x 3 doses as needed. For chest pain    . potassium chloride SA (K-DUR,KLOR-CON) 20 MEQ tablet Take 1 tablet (20 mEq total) by mouth 2 (two) times daily. 10 tablet 0  . promethazine (PHENERGAN) 25 MG tablet Take one tablet by mouth 4 times daily as needed for nausea    . sertraline (ZOLOFT) 50 MG tablet Take 50 mg by mouth daily.    . simvastatin (ZOCOR) 80 MG tablet Take 40 mg by mouth at bedtime.    [name] tiZANidine  (ZANAFLEX) 4 MG tablet Take 4 mg by mouth 3 (three) times daily.    . traMADol (ULTRAM) 50 MG tablet Take 50 mg by mouth every 6 (six) hours as needed.    . warfarin (COUMADIN) 4 MG tablet TAKE 2 TABLETS EVERY DAY  EXCEPT TAKE 1 AND 1/2 TABLETS  ON  SUNDAYS,  TUESDAYS AND THURSDAYS 163 tablet 3  . furosemide (LASIX) 40 MG tablet TAKE 40 MG (1 TABLET) IN THE AM AND TAKE 20 MG ( 1/2 TABLET) IN THE EVENING. 135 tablet 3   No current facility-administered medications for this visit.     Allergies:   Vancomycin    Social History:  The patient  reports that he has been smoking Cigarettes.  He started smoking about 45 years ago. He has a 40.00 pack-year smoking history. He has never used smokeless tobacco. He reports that he does not drink alcohol or use drugs.   Family History:  The patient's family history includes Colon cancer in his mother; Diabetes in his brother; Heart disease in his brother, father, and mother; Hypertension in his brother.    ROS: All other systems are reviewed and negative. Unless otherwise mentioned in H&P    PHYSICAL EXAM: VS:  BP 110/60   Pulse (!) 57   Ht 6' (1.829 m)   Wt 188 lb (85.3 kg)   SpO2 95%   BMI 25.50 kg/m  , BMI Body mass index is 25.5 kg/m. GEN: Well nourished, well developed, in no acute distress  HEENT: normal  Neck: no JVD, carotid bruits, or masses Cardiac: RRR; no murmurs, rubs, or gallops, 2+ pitting edema right leg greater than left leg. Respiratory:  clear to auscultation bilaterally, normal work of breathing GI: soft, nontender, nondistended, + BS MS: no deformity or atrophy  Skin: warm and dry, no rash Neuro:  Strength and sensation are intact Psych: euthymic mood, full affect  Recent Labs: 07/09/2016: BUN 17; Creatinine, Ser 1.13; Potassium 4.8; Sodium 138    Lipid Panel    Component Value Date/Time   CHOL 108 01/27/2014 0623   TRIG 100 01/27/2014 0623   HDL 46 01/27/2014 0623   CHOLHDL 2.3 01/27/2014 0623   VLDL 20  01/27/2014 0623   LDLCALC 42 01/27/2014 0623      Wt Readings from Last 3 Encounters:  07/12/16 188 lb (85.3 kg)  07/06/16 189 lb (85.7 kg)  01/21/16 175 lb (79.4 kg)      Other studies Reviewed: Additional studies/ records that were reviewed today include:   Echocardiogram: 01/28/2016 Left ventricle: The cavity size was moderately dilated. Wall thickness was normal. Systolic function was severely reduced. The estimated ejection fraction was in the range of 25% to 30%. Diffuse hypokinesis. There is akinesis of the mid-distalanteroseptal and apical myocardium. Features are consistent with a pseudonormal left ventricular filling pattern, with concomitant abnormal relaxation and increased filling pressure (grade 2 diastolic dysfunction). - Aortic valve: Trivial regurgitation. Valve area: 2.02cm^2 (Vmax). - Mitral valve: Mild regurgitation. - Left atrium: The atrium was severely dilated.   ASSESSMENT AND PLAN:  1. Acute on chronic systolic heart failure: The patient had good diuresis with increased dose of Lasix, but fluid retention returned when returning to normal daily dose of 40 mg daily. I will increase his Lasix to 40 mg in morning and 20 mg in the afternoon. He states he is not taking potassium and review of most recent labs reveals a normal potassium of 4.8. I advised him to take potassium daily with increased dose of Lasix. Most recent creatinine 1.13. I will repeat his BMET in a week after increasing his Lasix dose.   We'll need to adjust his medical therapy for optimal results. May need to discontinue lisinopril and change him to interest oh with a ARB bridge. He should continue carvedilol 6.25 mg twice a day. Will not admit the patient as he is feeling better despite ongoing lower extremity edema.  2. Atrial fibrillation: Heart rate is regular today. He will continue Coumadin therapy and has a follow-up appointment next week to recheck INR.  3.  Hypertension: Currently blood pressure is well-controlled on medication regimen.  Current medicines are reviewed at length with  the patient today.    Labs/ tests ordered today include:   Orders Placed This Encounter  Procedures  . Basic Metabolic Panel (BMET)     Disposition:   FU with 2 weeks. Lemar Lofty, LPN  [id number] PM    [facility]  [address], [place], [place] [address] Phone: [phone]; Fax: [phone]

## 2016-07-12 NOTE — Progress Notes (Signed)
Cardiology Office Note   Date:  07/12/2016   ID:  [name] 1955-12-26, MRN [id number]  PCP:  Alonza Bogus, MD  Cardiologist:  Bryna Colander, NP   No chief complaint on file.     History of Present Illness: [name] is a 60 y.o. male who presents for for ongoing assessment and management of ischemic cardiopathy, complete heart block, with IV ICD in situ, atrial flutter, chronic systolic heart failure.[name] KitchenHis weight had  increased, and thereforee his Lasix was increased to 40 mg twice a day with potassium 20 once twice a day. He will be given one dose of metolazone 2.5 mg to take in the morning times one. He will follow-up BMET in 3 days. We'll see him again in one week.  He's been advised if he continues to gain weight or does not have any results from diuresis he may need to be admitted for IV diuresis due to bowel edema which may not allow for bioavailability of by mouth meds.  He states that he hasn't lost any weight, but he is feeling better, his abdominal distention, he continues to have dependent lower extremity edema. His weight has been unchanged. He did state when he was taking extra doses of Lasix he had better diuresis but when he returned to her normal doses the fluid began to increase.  Past Medical History:  Diagnosis Date  . Anemia, normocytic normochromic 2008   2008  . Arteriosclerotic cardiovascular disease (ASCVD)    Acute MI in 2006->  LAD stent, EF-37%  . Arthritis    Hx: of in hands  . Automatic implantable cardioverter-defibrillator in situ   . Carpal tunnel syndrome   . Colon cancer ([place]) 2006   Partial colectomy in 2006  . Complete heart block ([place])    S/P ICD 02/09/13  . COPD (chronic obstructive pulmonary disease) ([place])   . Coronary artery disease   . Diabetes mellitus, type II ([place])    With peripheral neuropathy  . Edema    venous insufficiency  . H/O alcohol dependence ([place])   . H/O hiatal hernia   . Hyperlipidemia   .  Hypertension   . Myocardial infarction    Hx: of 2006  . Neuropathy in diabetes ([place])    Hx: of  . Pacemaker   . Pneumonia 01/2012   01/2012; subsequent admission for chest pain  . Shortness of breath    Hx: of   . Tobacco abuse     Past Surgical History:  Procedure Laterality Date  . ANKLE SURGERY     Right  . Biventricular ICD Placement  02/09/2013   Dr. Lovena Le  . COLONOSCOPY  08/17/2005   Pancolonic diverticula/Multiple rectal polyps removed as described above. The larger of the two likely responsible for intermittent hematochezia/ Multiple polyps at the hepatic flexure resected with a snare./ Large polypoid lesion growing out of the base of the cecum, just behind the  ileocecal valve. This was not felt to be amendable to endoscopic resection. Biopsied multiple times  . CORONARY ANGIOPLASTY WITH STENT PLACEMENT    . FLEXIBLE SIGMOIDOSCOPY    06/13/2006   Essentially normal residual rectum status post total colectomy. anastomosis at 25cm.  Focal area of abnormality adjacent to suture, as described above, likely not significant, suspect granulation tissue, biopsied.  Anastomosis otherwise appeared normal.  Distal terminal ileum appeared normal  . FLEXIBLE SIGMOIDOSCOPY N/A 01/13/2015   Procedure: FLEXIBLE SIGMOIDOSCOPY;  Surgeon: Daneil Dolin, MD;  [facility];  Service: Endoscopy;  Laterality: N/A;  735am  . INGUINAL HERNIA REPAIR    . MULTIPLE EXTRACTIONS WITH ALVEOLOPLASTY N/A 04/09/2013   Procedure: Extraction of tooth #'s 1,2,4,5,6,7,8,9,10,11,12,13,17,18,20,21,22,23,27,28, 29,30,31, and 32 with alveoloplasty.;  Surgeon: Lenn Cal, DDS;  Location: [place];  Service: Oral Surgery;  Laterality: N/A;  . PERMANENT PACEMAKER INSERTION N/A 02/09/2013   Procedure: PERMANENT PACEMAKER INSERTION;  Surgeon: Evans Lance, MD;  Location: [facility] CATH LAB;  Service: Cardiovascular;  Laterality: N/A;  . SUBTOTAL COLECTOMY  09/01/2005   subtotal colectomy  . TEMPORARY PACEMAKER  INSERTION Right 02/09/2013   Procedure: TEMPORARY PACEMAKER INSERTION;  Surgeon: Sinclair Grooms, MD;  Location: [facility] CATH LAB;  Service: Cardiovascular;  Laterality: Right;     Current Outpatient Prescriptions  Medication Sig Dispense Refill  . acetaminophen (TYLENOL) 500 MG tablet Take 1 tablet by mouth as needed.    [name] albuterol (PROVENTIL HFA;VENTOLIN HFA) 108 (90 BASE) MCG/ACT inhaler Inhale 2 puffs into the lungs every 6 (six) hours as needed. For shortness of breath    . albuterol (PROVENTIL) (2.5 MG/3ML) 0.083% nebulizer solution Take 2.5 mg by nebulization every 6 (six) hours as needed. For shortness of breath    . aspirin EC 81 MG tablet Take 1 tablet by mouth daily.    . carvedilol (COREG) 6.25 MG tablet Take 6.25 mg by mouth 2 (two) times daily with a meal.     . gabapentin (NEURONTIN) 300 MG capsule Take 300 mg by mouth at bedtime.    . insulin glargine (LANTUS) 100 UNIT/ML injection Inject 20 Units into the skin 2 (two) times daily.     [name] lisinopril (PRINIVIL) 10 MG tablet Take 1 tablet (10 mg total) by mouth daily. 30 tablet 12  . loratadine (CLARITIN) 10 MG tablet Take 10 mg by mouth daily. As needed    . magnesium oxide (MAG-OX) 400 MG tablet Take 400 mg by mouth 2 (two) times daily.    . metFORMIN (GLUCOPHAGE) 500 MG tablet Take 1,000 mg by mouth 2 (two) times daily with a meal.    . metolazone (ZAROXOLYN) 2.5 MG tablet Take 15 minutes prior to taking Lasix 1 tablet 0  . nitroGLYCERIN (NITROSTAT) 0.4 MG SL tablet Place 0.4 mg under the tongue every 5 (five) minutes x 3 doses as needed. For chest pain    . potassium chloride SA (K-DUR,KLOR-CON) 20 MEQ tablet Take 1 tablet (20 mEq total) by mouth 2 (two) times daily. 10 tablet 0  . promethazine (PHENERGAN) 25 MG tablet Take one tablet by mouth 4 times daily as needed for nausea    . sertraline (ZOLOFT) 50 MG tablet Take 50 mg by mouth daily.    . simvastatin (ZOCOR) 80 MG tablet Take 40 mg by mouth at bedtime.    [name] tiZANidine  (ZANAFLEX) 4 MG tablet Take 4 mg by mouth 3 (three) times daily.    . traMADol (ULTRAM) 50 MG tablet Take 50 mg by mouth every 6 (six) hours as needed.    . warfarin (COUMADIN) 4 MG tablet TAKE 2 TABLETS EVERY DAY  EXCEPT TAKE 1 AND 1/2 TABLETS  ON  SUNDAYS,  TUESDAYS AND THURSDAYS 163 tablet 3  . furosemide (LASIX) 40 MG tablet TAKE 40 MG (1 TABLET) IN THE AM AND TAKE 20 MG ( 1/2 TABLET) IN THE EVENING. 135 tablet 3   No current facility-administered medications for this visit.     Allergies:   Vancomycin    Social History:  The patient  reports that he has been smoking Cigarettes.  He started smoking about 45 years ago. He has a 40.00 pack-year smoking history. He has never used smokeless tobacco. He reports that he does not drink alcohol or use drugs.   Family History:  The patient's family history includes Colon cancer in his mother; Diabetes in his brother; Heart disease in his brother, father, and mother; Hypertension in his brother.    ROS: All other systems are reviewed and negative. Unless otherwise mentioned in H&P    PHYSICAL EXAM: VS:  BP 110/60   Pulse (!) 57   Ht 6' (1.829 m)   Wt 188 lb (85.3 kg)   SpO2 95%   BMI 25.50 kg/m  , BMI Body mass index is 25.5 kg/m. GEN: Well nourished, well developed, in no acute distress  HEENT: normal  Neck: no JVD, carotid bruits, or masses Cardiac: RRR; no murmurs, rubs, or gallops, 2+ pitting edema right leg greater than left leg. Respiratory:  clear to auscultation bilaterally, normal work of breathing GI: soft, nontender, nondistended, + BS MS: no deformity or atrophy  Skin: warm and dry, no rash Neuro:  Strength and sensation are intact Psych: euthymic mood, full affect  Recent Labs: 07/09/2016: BUN 17; Creatinine, Ser 1.13; Potassium 4.8; Sodium 138    Lipid Panel    Component Value Date/Time   CHOL 108 01/27/2014 0623   TRIG 100 01/27/2014 0623   HDL 46 01/27/2014 0623   CHOLHDL 2.3 01/27/2014 0623   VLDL 20  01/27/2014 0623   LDLCALC 42 01/27/2014 0623      Wt Readings from Last 3 Encounters:  07/12/16 188 lb (85.3 kg)  07/06/16 189 lb (85.7 kg)  01/21/16 175 lb (79.4 kg)      Other studies Reviewed: Additional studies/ records that were reviewed today include:   Echocardiogram: 01/28/2016 Left ventricle: The cavity size was moderately dilated. Wall thickness was normal. Systolic function was severely reduced. The estimated ejection fraction was in the range of 25% to 30%. Diffuse hypokinesis. There is akinesis of the mid-distalanteroseptal and apical myocardium. Features are consistent with a pseudonormal left ventricular filling pattern, with concomitant abnormal relaxation and increased filling pressure (grade 2 diastolic dysfunction). - Aortic valve: Trivial regurgitation. Valve area: 2.02cm^2 (Vmax). - Mitral valve: Mild regurgitation. - Left atrium: The atrium was severely dilated.   ASSESSMENT AND PLAN:  1. Acute on chronic systolic heart failure: The patient had good diuresis with increased dose of Lasix, but fluid retention returned when returning to normal daily dose of 40 mg daily. I will increase his Lasix to 40 mg in morning and 20 mg in the afternoon. He states he is not taking potassium and review of most recent labs reveals a normal potassium of 4.8. I advised him to take potassium daily with increased dose of Lasix. Most recent creatinine 1.13. I will repeat his BMET in a week after increasing his Lasix dose.   We'll need to adjust his medical therapy for optimal results. May need to discontinue lisinopril and change him to interest oh with a ARB bridge. He should continue carvedilol 6.25 mg twice a day. Will not admit the patient as he is feeling better despite ongoing lower extremity edema.  2. Atrial fibrillation: Heart rate is regular today. He will continue Coumadin therapy and has a follow-up appointment next week to recheck INR.  3.  Hypertension: Currently blood pressure is well-controlled on medication regimen.  Current medicines are reviewed at length with  the patient today.    Labs/ tests ordered today include:   Orders Placed This Encounter  Procedures  . Basic Metabolic Panel (BMET)     Disposition:   FU with 2 weeks. Signed, Jory Sims, NP  07/12/2016 4:39 PM    [facility] [address] [place], [place] [address] Phone: [phone]; Fax: [phone]

## 2016-07-12 NOTE — Patient Instructions (Signed)
Your physician recommends that you schedule a follow-up appointment in: 2 Weeks   Your physician has recommended you make the following change in your medication:  Increase Lasix to 40 mg in the Am and 20 mg in the Evening.   Your physician recommends that you return for lab work in: 1 Week (BMET)   Please Bring All Medications to Next Visit   If you need a refill on your cardiac medications before your next appointment, please call your pharmacy.  Thank you for choosing [facility]!

## 2016-07-13 DIAGNOSIS — E1165 Type 2 diabetes mellitus with hyperglycemia: Secondary | ICD-10-CM | POA: Diagnosis not present

## 2016-07-13 DIAGNOSIS — I25119 Atherosclerotic heart disease of native coronary artery with unspecified angina pectoris: Secondary | ICD-10-CM | POA: Diagnosis not present

## 2016-07-13 DIAGNOSIS — J189 Pneumonia, unspecified organism: Secondary | ICD-10-CM | POA: Diagnosis not present

## 2016-07-13 DIAGNOSIS — J449 Chronic obstructive pulmonary disease, unspecified: Secondary | ICD-10-CM | POA: Diagnosis not present

## 2016-07-19 ENCOUNTER — Other Ambulatory Visit (HOSPITAL_COMMUNITY)
Admission: RE | Admit: 2016-07-19 | Discharge: 2016-07-19 | Disposition: A | Discharge status: Home / Self Care | Payer: Commercial Managed Care - HMO | Source: Ambulatory Visit | Attending: Adult Health | Admitting: Adult Health

## 2016-07-19 ENCOUNTER — Other Ambulatory Visit: Payer: Self-pay | Admitting: Pharmacist

## 2016-07-19 DIAGNOSIS — Z79899 Other long term (current) drug therapy: Secondary | ICD-10-CM | POA: Diagnosis not present

## 2016-07-19 LAB — BASIC METABOLIC PANEL
ANION GAP: 5 (ref 5–15)
BUN: 21 mg/dL — ABNORMAL HIGH (ref 6–20)
CHLORIDE: 94 mmol/L — AB (ref 101–111)
CO2: 32 mmol/L (ref 22–32)
Calcium: 9 mg/dL (ref 8.9–10.3)
Creatinine, Ser: 1.06 mg/dL (ref 0.61–1.24)
GFR calc non Af Amer: >60 mL/min (ref >60)
Glucose, Bld: 398 mg/dL — ABNORMAL HIGH (ref 65–99)
Potassium: 5 mmol/L (ref 3.5–5.1)
Sodium: 131 mmol/L — ABNORMAL LOW (ref 135–145)

## 2016-07-19 NOTE — Patient Outreach (Signed)
Outreach call to [name] regarding his request for follow up from the [facility] Medication Adherence Campaign. Left a HIPAA compliant message for the patient.  Harlow Asa, PharmD Clinical Pharmacist [facility] [phone]

## 2016-07-20 ENCOUNTER — Telehealth: Payer: Self-pay

## 2016-07-20 DIAGNOSIS — Z79899 Other long term (current) drug therapy: Secondary | ICD-10-CM

## 2016-07-20 MED ORDER — METOLAZONE 2.5 MG PO TABS: 2.5000 mg | ORAL_TABLET | ORAL | 3 refills | Status: DC

## 2016-07-20 NOTE — Telephone Encounter (Signed)
I spoke with wife,she understands pt will take metolazone 2.5 mg every other day,30 minutes before am lasix dose,get BMET in 1 week,mailed lab slip

## 2016-07-20 NOTE — Telephone Encounter (Signed)
-----   Message from Lendon Colonel, NP sent at 07/20/2016  8:26 AM EDT ----- Regarding: RE: not on metolazone Please order 2.5 mg of metolazone QOD. Follow up BMET in one week.  ----- Message ----- From: Bernita Raisin, RN Sent: 07/20/2016   8:18 AM To: Lendon Colonel, NP Subject: not on metolazone                              Wife states he has no metolazone left, u had want to decrease to qod

## 2016-07-20 NOTE — Telephone Encounter (Signed)
-----   Message from Lendon Colonel, NP sent at 07/19/2016  4:50 PM EDT ----- Change metolazone top QOD please. Repeat BMET in one week.

## 2016-07-26 ENCOUNTER — Encounter: Payer: Self-pay | Admitting: Adult Health

## 2016-07-26 ENCOUNTER — Ambulatory Visit: Payer: Self-pay | Admitting: Adult Health

## 2016-07-26 ENCOUNTER — Ambulatory Visit (INDEPENDENT_AMBULATORY_CARE_PROVIDER_SITE_OTHER): Payer: Commercial Managed Care - HMO | Admitting: *Deleted

## 2016-07-26 ENCOUNTER — Ambulatory Visit (INDEPENDENT_AMBULATORY_CARE_PROVIDER_SITE_OTHER): Payer: Commercial Managed Care - HMO | Admitting: Adult Health

## 2016-07-26 VITALS — BP 110/62 | HR 62 | Ht 72.0 in | Wt 174.0 lb

## 2016-07-26 DIAGNOSIS — I5022 Chronic systolic (congestive) heart failure: Secondary | ICD-10-CM

## 2016-07-26 DIAGNOSIS — I10 Essential (primary) hypertension: Secondary | ICD-10-CM | POA: Diagnosis not present

## 2016-07-26 DIAGNOSIS — I4891 Unspecified atrial fibrillation: Secondary | ICD-10-CM | POA: Diagnosis not present

## 2016-07-26 DIAGNOSIS — Z5181 Encounter for therapeutic drug level monitoring: Secondary | ICD-10-CM

## 2016-07-26 DIAGNOSIS — Z23 Encounter for immunization: Secondary | ICD-10-CM

## 2016-07-26 DIAGNOSIS — I255 Ischemic cardiomyopathy: Secondary | ICD-10-CM | POA: Diagnosis not present

## 2016-07-26 LAB — POCT INR: INR: 1.7

## 2016-07-26 MED ORDER — FUROSEMIDE 40 MG PO TABS: ORAL_TABLET | ORAL | 3 refills | Status: DC

## 2016-07-26 MED ORDER — METOLAZONE 2.5 MG PO TABS: ORAL_TABLET | ORAL | 3 refills | Status: DC

## 2016-07-26 NOTE — Patient Instructions (Signed)
Your physician recommends that you schedule a follow-up appointment in:  1 month K Lawrence NP     Take Metolazone 2.5 mg on Sunday and Wednesday ONLY    Lab work : BMET in 2 weeks   ( November 6th )        Thank you for choosing [facility] !

## 2016-07-26 NOTE — Progress Notes (Signed)
Cardiology Office Note   Date:  07/26/2016   ID:  [name] 09-27-1956, MRN [id number]  PCP:  Alonza Bogus, MD  Cardiologist: Bryna Colander, NP   Chief Complaint  Patient presents with  . Cardiomyopathy      History of Present Illness: [name] is a 60 y.o. male who presents for ongoing assessment and management of ischemic cardiomyopathy, complete heart block with history of IVCD in situ, atrial flutter, chronic systolic heart failure. He was last in the office on 07/12/2016. He continued to have lower extremity edema, his weight was unchanged with increased dose of diuretics, but his breathing status improved.  Echocardiogram: 01/28/2016 Left ventricle: The cavity size was moderately dilated. Wall thickness was normal. Systolic function was severely reduced. The estimated ejection fraction was in the range of 25% to 30%. Diffuse hypokinesis. There is akinesis of the mid-distalanteroseptal and apical myocardium. Features are consistent with a pseudonormal left ventricular filling pattern, with concomitant abnormal relaxation and increased filling pressure (grade 2 diastolic dysfunction). - Aortic valve: Trivial regurgitation. Valve area: 2.02cm^2 (Vmax). - Mitral valve: Mild regurgitation. - Left atrium: The atrium was severely dilated.  On last office visit this Lasix was increased to 40 mg in the morning and 20 mg in the afternoon. He was advised to take potassium 20 mEq daily with increased dose of Lasix. Follow-up BMET. Consideration for Delene Loll will be had on this office visit after labs are reviewed. He is also going to be seen by Coumadin clinic today for INR in the setting of atrial fibrillation.  He comes today feeling much better with increased dose of Lasix, follow-up BMET did reveal some hyponatremia with a sodium of 131. He has had some leg cramping and the upper thighs, no dizziness nausea or vomiting or confusion. He  states he's feeling better with less weight and edema.  Past Medical History:  Diagnosis Date  . Anemia, normocytic normochromic 2008   2008  . Arteriosclerotic cardiovascular disease (ASCVD)    Acute MI in 2006->  LAD stent, EF-37%  . Arthritis    Hx: of in hands  . Automatic implantable cardioverter-defibrillator in situ   . Carpal tunnel syndrome   . Colon cancer ([place]) 2006   Partial colectomy in 2006  . Complete heart block ([place])    S/P ICD 02/09/13  . COPD (chronic obstructive pulmonary disease) ([place])   . Coronary artery disease   . Diabetes mellitus, type II ([place])    With peripheral neuropathy  . Edema    venous insufficiency  . H/O alcohol dependence ([place])   . H/O hiatal hernia   . Hyperlipidemia   . Hypertension   . Myocardial infarction    Hx: of 2006  . Neuropathy in diabetes ([place])    Hx: of  . Pacemaker   . Pneumonia 01/2012   01/2012; subsequent admission for chest pain  . Shortness of breath    Hx: of   . Tobacco abuse     Past Surgical History:  Procedure Laterality Date  . ANKLE SURGERY     Right  . Biventricular ICD Placement  02/09/2013   Dr. Lovena Le  . COLONOSCOPY  08/17/2005   Pancolonic diverticula/Multiple rectal polyps removed as described above. The larger of the two likely responsible for intermittent hematochezia/ Multiple polyps at the hepatic flexure resected with a snare./ Large polypoid lesion growing out of the base of the cecum, just behind the  ileocecal valve. This was not felt to  be amendable to endoscopic resection. Biopsied multiple times  . CORONARY ANGIOPLASTY WITH STENT PLACEMENT    . FLEXIBLE SIGMOIDOSCOPY    06/13/2006   Essentially normal residual rectum status post total colectomy. anastomosis at 25cm.  Focal area of abnormality adjacent to suture, as described above, likely not significant, suspect granulation tissue, biopsied.  Anastomosis otherwise appeared normal.  Distal terminal ileum appeared normal  . FLEXIBLE SIGMOIDOSCOPY  N/A 01/13/2015   Procedure: FLEXIBLE SIGMOIDOSCOPY;  Surgeon: Daneil Dolin, MD;  [facility];  Service: Endoscopy;  Laterality: N/A;  735am  . INGUINAL HERNIA REPAIR    . MULTIPLE EXTRACTIONS WITH ALVEOLOPLASTY N/A 04/09/2013   Procedure: Extraction of tooth #'s 1,2,4,5,6,7,8,9,10,11,12,13,17,18,20,21,22,23,27,28, 29,30,31, and 32 with alveoloplasty.;  Surgeon: Lenn Cal, DDS;  Location: [place];  Service: Oral Surgery;  Laterality: N/A;  . PERMANENT PACEMAKER INSERTION N/A 02/09/2013   Procedure: PERMANENT PACEMAKER INSERTION;  Surgeon: Evans Lance, MD;  Location: [facility] CATH LAB;  Service: Cardiovascular;  Laterality: N/A;  . SUBTOTAL COLECTOMY  09/01/2005   subtotal colectomy  . TEMPORARY PACEMAKER INSERTION Right 02/09/2013   Procedure: TEMPORARY PACEMAKER INSERTION;  Surgeon: Sinclair Grooms, MD;  Location: [facility] CATH LAB;  Service: Cardiovascular;  Laterality: Right;     Current Outpatient Prescriptions  Medication Sig Dispense Refill  . acetaminophen (TYLENOL) 500 MG tablet Take 1 tablet by mouth as needed.    [name] albuterol (PROVENTIL HFA;VENTOLIN HFA) 108 (90 BASE) MCG/ACT inhaler Inhale 2 puffs into the lungs every 6 (six) hours as needed. For shortness of breath    . albuterol (PROVENTIL) (2.5 MG/3ML) 0.083% nebulizer solution Take 2.5 mg by nebulization every 6 (six) hours as needed. For shortness of breath    . aspirin EC 81 MG tablet Take 1 tablet by mouth daily.    . carvedilol (COREG) 6.25 MG tablet Take 6.25 mg by mouth 2 (two) times daily with a meal.     . furosemide (LASIX) 40 MG tablet TAKE 40 MG (1 TABLET) IN THE AM AND TAKE 20 MG ( 1/2 TABLET) IN THE EVENING. 135 tablet 3  . gabapentin (NEURONTIN) 300 MG capsule Take 300 mg by mouth at bedtime.    . insulin glargine (LANTUS) 100 UNIT/ML injection Inject 20 Units into the skin 2 (two) times daily.     [name] lisinopril (PRINIVIL) 10 MG tablet Take 1 tablet (10 mg total) by mouth daily. 30 tablet 12  . loratadine  (CLARITIN) 10 MG tablet Take 10 mg by mouth daily. As needed    . magnesium oxide (MAG-OX) 400 MG tablet Take 400 mg by mouth 2 (two) times daily.    . metFORMIN (GLUCOPHAGE) 500 MG tablet Take 1,000 mg by mouth 2 (two) times daily with a meal.    . nitroGLYCERIN (NITROSTAT) 0.4 MG SL tablet Place 0.4 mg under the tongue every 5 (five) minutes x 3 doses as needed. For chest pain    . potassium chloride SA (K-DUR,KLOR-CON) 20 MEQ tablet Take 1 tablet (20 mEq total) by mouth 2 (two) times daily. 10 tablet 0  . promethazine (PHENERGAN) 25 MG tablet Take one tablet by mouth 4 times daily as needed for nausea    . sertraline (ZOLOFT) 50 MG tablet Take 50 mg by mouth daily.    . simvastatin (ZOCOR) 80 MG tablet Take 40 mg by mouth at bedtime.    [name] tiZANidine (ZANAFLEX) 4 MG tablet Take 4 mg by mouth 3 (three) times daily.    [name]  traMADol (ULTRAM) 50 MG tablet Take 50 mg by mouth every 6 (six) hours as needed.    . warfarin (COUMADIN) 4 MG tablet TAKE 2 TABLETS EVERY DAY  EXCEPT TAKE 1 AND 1/2 TABLETS  ON  SUNDAYS,  TUESDAYS AND THURSDAYS 163 tablet 3  . metolazone (ZAROXOLYN) 2.5 MG tablet Take 2.5 mg on Sunday and Wednesday ONLY 24 tablet 3   No current facility-administered medications for this visit.     Allergies:   Vancomycin    Social History:  The patient  reports that he has been smoking Cigarettes.  He started smoking about 45 years ago. He has a 40.00 pack-year smoking history. He has never used smokeless tobacco. He reports that he does not drink alcohol or use drugs.   Family History:  The patient's family history includes Colon cancer in his mother; Diabetes in his brother; Heart disease in his brother, father, and mother; Hypertension in his brother.    ROS: All other systems are reviewed and negative. Unless otherwise mentioned in H&P    PHYSICAL EXAM: VS:  BP 110/62   Pulse 62   Ht 6' (1.829 m)   Wt 174 lb (78.9 kg)   SpO2 93%   BMI 23.60 kg/m  , BMI Body mass index is 23.6  kg/m. GEN: Well nourished, well developed, in no acute distress  HEENT: normal  Neck: no JVD, carotid bruits, or masses Cardiac: RRR; no murmurs, rubs, or gallops,no edema  Respiratory:  clear to auscultation bilaterally, normal work of breathing GI: soft, nontender, nondistended, + BS MS: no deformity or atrophy  Skin: warm and dry, no rash Neuro:  Strength and sensation are intact Psych: euthymic mood, full affect   Recent Labs: 07/19/2016: BUN 21; Creatinine, Ser 1.06; Potassium 5.0; Sodium 131    Lipid Panel    Component Value Date/Time   CHOL 108 01/27/2014 0623   TRIG 100 01/27/2014 0623   HDL 46 01/27/2014 0623   CHOLHDL 2.3 01/27/2014 0623   VLDL 20 01/27/2014 0623   LDLCALC 42 01/27/2014 0623      Wt Readings from Last 3 Encounters:  07/26/16 174 lb (78.9 kg)  07/12/16 188 lb (85.3 kg)  07/06/16 189 lb (85.7 kg)        ASSESSMENT AND PLAN:  1.Ischemic cardiomyopathy: There have been issues with medical noncompliance in the past, that he is doing very well now. He is taking medications as directed, has had no complaints of palpitations. Most recent ejection fraction is between 20%  and 25%. Would like repeat echocardiogram after another month of medical compliance. I would like to start Entresto but due to sodium level I would like to back off on metolazone to  2.5 mg on Sundays and Wednesday. Follow-up BMET in 2 weeks. We'll see him again in a month. May change to [facility] at that time.  2. ICD in situ: Most recent interrogation on 07/08/2016. Continue remote check protocol and follow-up appointment with Dr. Lovena Le as directed.  3. Atrial fibrillation: Heart rate is currently well controlled. Continue carvedilol 6.25 mg twice a day. INR will be checked today prior to leaving office. Continue Coumadin.   Current medicines are reviewed at length with the patient today.    Labs/ tests ordered today include:  BMET 2 weeks  Orders Placed This Encounter   Procedures  . Flu Vaccine QUAD 36+ mos IM  . Basic Metabolic Panel (BMET)     Disposition:   FU with  One month. Signed, Curt Bears  Purcell Nails, NP  07/26/2016 4:09 PM    [facility] [address], [place], [place] [address] Phone: [phone]; Fax: [phone]

## 2016-08-09 ENCOUNTER — Telehealth: Payer: Self-pay

## 2016-08-09 ENCOUNTER — Other Ambulatory Visit (HOSPITAL_COMMUNITY)
Admission: RE | Admit: 2016-08-09 | Discharge: 2016-08-09 | Disposition: A | Discharge status: Home / Self Care | Payer: Commercial Managed Care - HMO | Source: Ambulatory Visit | Attending: Adult Health | Admitting: Adult Health

## 2016-08-09 DIAGNOSIS — I10 Essential (primary) hypertension: Secondary | ICD-10-CM | POA: Diagnosis not present

## 2016-08-09 DIAGNOSIS — E875 Hyperkalemia: Secondary | ICD-10-CM

## 2016-08-09 LAB — BASIC METABOLIC PANEL
ANION GAP: 5 (ref 5–15)
BUN: 23 mg/dL — ABNORMAL HIGH (ref 6–20)
CHLORIDE: 102 mmol/L (ref 101–111)
CO2: 28 mmol/L (ref 22–32)
Calcium: 8.8 mg/dL — ABNORMAL LOW (ref 8.9–10.3)
Creatinine, Ser: 1.28 mg/dL — ABNORMAL HIGH (ref 0.61–1.24)
GFR calc Af Amer: >60 mL/min (ref >60)
GFR, EST NON AFRICAN AMERICAN: 59 mL/min — AB (ref >60)
GLUCOSE: 263 mg/dL — AB (ref 65–99)
POTASSIUM: 5.8 mmol/L — AB (ref 3.5–5.1)
Sodium: 135 mmol/L (ref 135–145)

## 2016-08-09 LAB — POTASSIUM: POTASSIUM: 6.2 mmol/L — AB (ref 3.5–5.1)

## 2016-08-09 NOTE — Telephone Encounter (Signed)
[facility] sent patient #10 tablets KCL 40 meq on 07/06/16 but has no history of ever supplying patient with potassium.[facility] also states they do not fill potassium for patient.Patient will get repeat STAT K + now per Arnold Long NP

## 2016-08-09 NOTE — Telephone Encounter (Signed)
Wife says he is NOT on any potassium,says he was told to stop it at second visit.She says he takes lasix 40 mg am and 20 mg pm and bottle says take with orange juice.

## 2016-08-09 NOTE — Telephone Encounter (Signed)
-----   Message from Lendon Colonel, NP sent at 08/09/2016  1:10 PM EST ----- Decrease potassium to once a day.

## 2016-08-16 ENCOUNTER — Ambulatory Visit (INDEPENDENT_AMBULATORY_CARE_PROVIDER_SITE_OTHER): Payer: Commercial Managed Care - HMO | Admitting: *Deleted

## 2016-08-16 DIAGNOSIS — I4891 Unspecified atrial fibrillation: Secondary | ICD-10-CM | POA: Diagnosis not present

## 2016-08-16 DIAGNOSIS — Z5181 Encounter for therapeutic drug level monitoring: Secondary | ICD-10-CM | POA: Diagnosis not present

## 2016-08-16 LAB — POCT INR: INR: 2.1

## 2016-08-30 ENCOUNTER — Ambulatory Visit (INDEPENDENT_AMBULATORY_CARE_PROVIDER_SITE_OTHER): Payer: Commercial Managed Care - HMO | Admitting: Adult Health

## 2016-08-30 ENCOUNTER — Encounter: Payer: Self-pay | Admitting: Adult Health

## 2016-08-30 VITALS — BP 142/80 | HR 61 | Ht 72.0 in | Wt 178.0 lb

## 2016-08-30 DIAGNOSIS — I2510 Atherosclerotic heart disease of native coronary artery without angina pectoris: Secondary | ICD-10-CM | POA: Diagnosis not present

## 2016-08-30 DIAGNOSIS — Z95810 Presence of automatic (implantable) cardiac defibrillator: Secondary | ICD-10-CM

## 2016-08-30 DIAGNOSIS — I482 Chronic atrial fibrillation: Secondary | ICD-10-CM

## 2016-08-30 DIAGNOSIS — I255 Ischemic cardiomyopathy: Secondary | ICD-10-CM

## 2016-08-30 DIAGNOSIS — Z79899 Other long term (current) drug therapy: Secondary | ICD-10-CM | POA: Diagnosis not present

## 2016-08-30 DIAGNOSIS — I4821 Permanent atrial fibrillation: Secondary | ICD-10-CM

## 2016-08-30 MED ORDER — METOLAZONE 2.5 MG PO TABS
ORAL_TABLET | ORAL | 3 refills | Status: DC
Start: 2016-08-30 — End: 2017-07-19

## 2016-08-30 NOTE — Progress Notes (Deleted)
Name: [name]    DOB: 07-09-1956  Age: 60 y.o.  MR#: [id number]       PCP:  Alonza Bogus, MD      Insurance: Payor: [employer] MEDICARE / Plan: [place] THN/NTSP / Product Type: *No Product type* /   CC:   No chief complaint on file.   VS Vitals:   08/30/16 1524  Pulse: 61  SpO2: 94%  Weight: 178 lb (80.7 kg)  Height: 6' (1.829 m)    Weights Current Weight  08/30/16 178 lb (80.7 kg)  07/26/16 174 lb (78.9 kg)  07/12/16 188 lb (85.3 kg)    Blood Pressure  BP Readings from Last 3 Encounters:  07/26/16 110/62  07/12/16 110/60  07/06/16 (!) 150/74     Admit date:  (Not on file) Last encounter with RMR:  07/26/2016   Allergy Vancomycin  Current Outpatient Prescriptions  Medication Sig Dispense Refill  . acetaminophen (TYLENOL) 500 MG tablet Take 1 tablet by mouth as needed.    [name] albuterol (PROVENTIL HFA;VENTOLIN HFA) 108 (90 BASE) MCG/ACT inhaler Inhale 2 puffs into the lungs every 6 (six) hours as needed. For shortness of breath    . albuterol (PROVENTIL) (2.5 MG/3ML) 0.083% nebulizer solution Take 2.5 mg by nebulization every 6 (six) hours as needed. For shortness of breath    . aspirin EC 81 MG tablet Take 1 tablet by mouth daily.    . carvedilol (COREG) 6.25 MG tablet Take 6.25 mg by mouth 2 (two) times daily with a meal.     . furosemide (LASIX) 40 MG tablet TAKE 40 MG (1 TABLET) IN THE AM AND TAKE 20 MG ( 1/2 TABLET) IN THE EVENING. 135 tablet 3  . gabapentin (NEURONTIN) 300 MG capsule Take 300 mg by mouth at bedtime.    . insulin glargine (LANTUS) 100 UNIT/ML injection Inject 20 Units into the skin 2 (two) times daily.     [name] lisinopril (PRINIVIL) 10 MG tablet Take 1 tablet (10 mg total) by mouth daily. 30 tablet 12  . loratadine (CLARITIN) 10 MG tablet Take 10 mg by mouth daily. As needed    . magnesium oxide (MAG-OX) 400 MG tablet Take 400 mg by mouth 2 (two) times daily.    . metFORMIN (GLUCOPHAGE) 500 MG tablet Take 1,000 mg by mouth 2 (two) times  daily with a meal.    . metolazone (ZAROXOLYN) 2.5 MG tablet Take 2.5 mg on Sunday and Wednesday ONLY 24 tablet 3  . nitroGLYCERIN (NITROSTAT) 0.4 MG SL tablet Place 0.4 mg under the tongue every 5 (five) minutes x 3 doses as needed. For chest pain    . promethazine (PHENERGAN) 25 MG tablet Take one tablet by mouth 4 times daily as needed for nausea    . sertraline (ZOLOFT) 50 MG tablet Take 50 mg by mouth daily.    . simvastatin (ZOCOR) 80 MG tablet Take 40 mg by mouth at bedtime.    [name] tiZANidine (ZANAFLEX) 4 MG tablet Take 4 mg by mouth 3 (three) times daily.    . traMADol (ULTRAM) 50 MG tablet Take 50 mg by mouth every 6 (six) hours as needed.    . warfarin (COUMADIN) 4 MG tablet TAKE 2 TABLETS EVERY DAY  EXCEPT TAKE 1 AND 1/2 TABLETS  ON  SUNDAYS,  TUESDAYS AND THURSDAYS 163 tablet 3   No current facility-administered medications for this visit.     Discontinued Meds:    Medications Discontinued During This Encounter  Medication Reason  . potassium chloride SA (K-DUR,KLOR-CON) 20 MEQ tablet Error    Patient Active Problem List   Diagnosis Date Noted  . History of colon cancer 12/25/2014  . Abdominal pain 12/25/2014  . Encounter for therapeutic drug monitoring 02/11/2014  . Atrial fibrillation ([place]) 02/04/2014  . Dizziness 01/26/2014  . Chronic systolic CHF (congestive heart failure) ([place]) 01/26/2014  . Ataxia 01/26/2014  . Biventricular implantable cardioverter-defibrillator in situ 03/16/2013  . COPD (chronic obstructive pulmonary disease) ([place])   . Arteriosclerotic cardiovascular disease (ASCVD)   . Diabetes mellitus, type II ([place])   . Colon cancer ([place])   . Hypertension   . Hyperlipidemia   . Tobacco abuse   . Anemia, normocytic normochromic   . Chronic diarrhea 07/10/2012    LABS    Component Value Date/Time   NA 135 08/09/2016 0925   NA 131 (L) 07/19/2016 0810   NA 138 07/09/2016 0815   K 6.2 (H) 08/09/2016 1628   K 5.8 (H) 08/09/2016 0925   K 5.0 07/19/2016  0810   CL 102 08/09/2016 0925   CL 94 (L) 07/19/2016 0810   CL 104 07/09/2016 0815   CO2 28 08/09/2016 0925   CO2 32 07/19/2016 0810   CO2 29 07/09/2016 0815   GLUCOSE 263 (H) 08/09/2016 0925   GLUCOSE 398 (H) 07/19/2016 0810   GLUCOSE 117 (H) 07/09/2016 0815   BUN 23 (H) 08/09/2016 0925   BUN 21 (H) 07/19/2016 0810   BUN 17 07/09/2016 0815   CREATININE 1.28 (H) 08/09/2016 0925   CREATININE 1.06 07/19/2016 0810   CREATININE 1.13 07/09/2016 0815   CALCIUM 8.8 (L) 08/09/2016 0925   CALCIUM 9.0 07/19/2016 0810   CALCIUM 8.7 (L) 07/09/2016 0815   GFRNONAA 59 (L) 08/09/2016 0925   GFRNONAA >60 07/19/2016 0810   GFRNONAA >60 07/09/2016 0815   GFRAA >60 08/09/2016 0925   GFRAA >60 07/19/2016 0810   GFRAA >60 07/09/2016 0815   CMP     Component Value Date/Time   NA 135 08/09/2016 0925   K 6.2 (H) 08/09/2016 1628   CL 102 08/09/2016 0925   CO2 28 08/09/2016 0925   GLUCOSE 263 (H) 08/09/2016 0925   BUN 23 (H) 08/09/2016 0925   CREATININE 1.28 (H) 08/09/2016 0925   CALCIUM 8.8 (L) 08/09/2016 0925   PROT 7.6 01/26/2014 0626   ALBUMIN 3.9 01/26/2014 0626   AST 24 01/26/2014 0626   ALT 12 01/26/2014 0626   ALKPHOS 126 (H) 01/26/2014 0626   BILITOT 0.2 (L) 01/26/2014 0626   GFRNONAA 59 (L) 08/09/2016 0925   GFRAA >60 08/09/2016 0925       Component Value Date/Time   WBC 6.4 01/26/2014 0626   WBC 8.7 04/05/2013 1414   WBC 9.2 02/10/2013 0350   HGB 13.3 01/26/2014 0650   HGB 12.8 (L) 01/26/2014 0626   HGB 11.0 (L) 04/05/2013 1414   HCT 39.0 01/26/2014 0650   HCT 39.0 01/26/2014 0626   HCT 32.6 (L) 04/05/2013 1414   MCV 90.7 01/26/2014 0626   MCV 88.1 04/05/2013 1414   MCV 89.1 02/10/2013 0350    Lipid Panel     Component Value Date/Time   CHOL 108 01/27/2014 0623   TRIG 100 01/27/2014 0623   HDL 46 01/27/2014 0623   CHOLHDL 2.3 01/27/2014 0623   VLDL 20 01/27/2014 0623   LDLCALC 42 01/27/2014 0623    ABG    Component Value Date/Time   PHART 7.319 (L)  03/28/2010 [id number]  PCO2ART 44.9 03/28/2010 0528   PO2ART 67.2 (L) 03/28/2010 0528   HCO3 22.3 03/28/2010 0528   TCO2 28 01/26/2014 0650   ACIDBASEDEF 2.8 (H) 03/28/2010 0528   O2SAT 93.2 03/28/2010 0528     Lab Results  Component Value Date   TSH 3.144 ***Test methodology is 3rd generation TSH*** 03/26/2010   BNP (last 3 results) No results for input(s): BNP in the last 8760 hours.  ProBNP (last 3 results) No results for input(s): PROBNP in the last 8760 hours.  Cardiac Panel (last 3 results) No results for input(s): CKTOTAL, CKMB, TROPONINI, RELINDX in the last 72 hours.  Iron/TIBC/Ferritin/ %Sat No results found for: IRON, TIBC, FERRITIN, IRONPCTSAT   EKG Orders placed or performed in visit on 07/06/16  . EKG 12-Lead     Prior Assessment and Plan Problem List as of 08/30/2016 Reviewed: 07/26/2016  3:47 PM by Jory Sims, NP     Cardiovascular and Mediastinum   Arteriosclerotic cardiovascular disease (ASCVD)   Last Assessment & Plan 03/16/2013 Office Visit Written 03/16/2013  1:47 PM by Lendon Colonel, NP    He is without cardiac complaints. He will continue on risk stratification, continue Plavix until 5 days prior to dental extraction. Will see him in one month for ongoing assessment      Hypertension   Chronic systolic CHF (congestive heart failure) [facility])   Last Assessment & Plan 02/05/2015 Office Visit Written 02/05/2015 11:11 AM by Evans Lance, MD    His symptoms remain class 2. He will continue his current meds.      Atrial fibrillation [facility])   Last Assessment & Plan 02/05/2015 Office Visit Written 02/05/2015 11:10 AM by Evans Lance, MD    His ventricular rate is well controlled. He will continue his current meds.         Respiratory   COPD (chronic obstructive pulmonary disease) (HCC)     Digestive   Chronic diarrhea   Colon cancer (HCC)     Endocrine   Diabetes mellitus, type II ([place])     Other   Hyperlipidemia   Tobacco abuse   Anemia,  normocytic normochromic   Last Assessment & Plan 12/25/2014 Office Visit Written 12/25/2014  1:57 PM by Carlis Stable, NP    Patient with abdominal pain for the past 3 months, normocytic anemia. Family history of colon cancer and personal history of colon cancer 2006 with total colectomy. Last colon endoscopic exam 2008. Denies other red flag/warning signs/symptoms like bleeding, weight loss, and others. Will plan for repeat flexible sigmoidoscopy.  Proceed with flexible sigmoidoscopy with Dr. Gala Romney in near future: the risks, benefits, and alternatives have been discussed with the patient in detail. The patient states understanding and desires to proceed.      Biventricular implantable cardioverter-defibrillator in situ   Last Assessment & Plan 02/05/2015 Office Visit Written 02/05/2015 11:11 AM by Evans Lance, MD    His BiV ICD is reprogrammed today from DDD to VVI as he is chronically in atrial fib. Will recheck in several months.       Dizziness   Ataxia   Encounter for therapeutic drug monitoring   History of colon cancer   Last Assessment & Plan 12/25/2014 Office Visit Written 12/25/2014  1:57 PM by Carlis Stable, NP    Patient with abdominal pain for the past 3 months, normocytic anemia. Family history of colon cancer and personal history of colon cancer 2006 with total colectomy. Last colon endoscopic exam 2008. Denies  other red flag/warning signs/symptoms like bleeding, weight loss, and others. Will plan for repeat flexible sigmoidoscopy.  Proceed with flexible sigmoidoscopy with Dr. Gala Romney in near future: the risks, benefits, and alternatives have been discussed with the patient in detail. The patient states understanding and desires to proceed.      Abdominal pain   Last Assessment & Plan 12/25/2014 Office Visit Written 12/25/2014  1:57 PM by Carlis Stable, NP    Patient with abdominal pain for the past 3 months, normocytic anemia. Family history of colon cancer and personal history of colon  cancer 2006 with total colectomy. Last colon endoscopic exam 2008. Denies other red flag/warning signs/symptoms like bleeding, weight loss, and others. Will plan for repeat flexible sigmoidoscopy.  Proceed with flexible sigmoidoscopy with Dr. Gala Romney in near future: the risks, benefits, and alternatives have been discussed with the patient in detail. The patient states understanding and desires to proceed.           Imaging: No results found.

## 2016-08-30 NOTE — Progress Notes (Signed)
Cardiology Office Note   Date:  08/30/2016   ID:  [name], [name] [birth year], MRN [id number]  PCP:  [name] Bogus, MD  Cardiologist: [name] Colander, NP   No chief complaint on file.     History of Present Illness: [name] is a 60 y.o. male who presents for ongoing assessment and management of ischemic cardiomyopathy, coronary artery disease, stent to the LAD in 2006, complete heart block with with defibrillator in situ, atrial flutter, chronic systolic heart failure, and chronic anemia. The patient was last seen in the office on 07/12/2016, at that time he had gained fluid weight he had been increased on by mouth diuretics and was feeling better but his weight had not improved.   On last office visit his Lasix was increased to 40 mg in the morning and 20 mg in the afternoon. He was advised to take potassium daily a repeat BMET was ordered one week after increasing Lasix dose. Consideration for Delene Loll was discussed and should be addressed on this office visit. Carvedilol was continued at 6.25 mg twice a day. He was continued on anticoagulation therapy, and is followed in our Coumadin clinic here in the [place].  Follow-up labs on 08/09/2016 revealed sodium 135, potassium 5.8, chloride 102, CO2 28, BUN 23, creatinine 1.28. Potassium level was found to be elevated on 08/09/2016 at 6.2. Potassium was discontinued.  He is without any complaint today. He is noticing, however that his pacemaker is beeping. It goes off at 6:20 and 12:20 each day and has been doing so for the last 2 weeks. He does not feel bad, nor does he have any palpitations or chest pain. His pacemaker was last checked in October 2017 and was working appropriately with 7 years on his battery life.  Past Medical History:  Diagnosis Date  . Anemia, normocytic normochromic 2008   2008  . Arteriosclerotic cardiovascular disease (ASCVD)    Acute MI in 2006->  LAD stent, EF-37%  . Arthritis    Hx: of in  hands  . Automatic implantable cardioverter-defibrillator in situ   . Carpal tunnel syndrome   . Colon cancer ([place]) 2006   Partial colectomy in 2006  . Complete heart block ([place])    S/P ICD 02/09/13  . COPD (chronic obstructive pulmonary disease) ([place])   . Coronary artery disease   . Diabetes mellitus, type II ([place])    With peripheral neuropathy  . Edema    venous insufficiency  . H/O alcohol dependence ([place])   . H/O hiatal hernia   . Hyperlipidemia   . Hypertension   . Myocardial infarction    Hx: of 2006  . Neuropathy in diabetes ([place])    Hx: of  . Pacemaker   . Pneumonia 01/2012   01/2012; subsequent admission for chest pain  . Shortness of breath    Hx: of   . Tobacco abuse     Past Surgical History:  Procedure Laterality Date  . ANKLE SURGERY     Right  . Biventricular ICD Placement  02/09/2013   Dr. Lovena Le  . COLONOSCOPY  08/17/2005   Pancolonic diverticula/Multiple rectal polyps removed as described above. The larger of the two likely responsible for intermittent hematochezia/ Multiple polyps at the hepatic flexure resected with a snare./ Large polypoid lesion growing out of the base of the cecum, just behind the  ileocecal valve. This was not felt to be amendable to endoscopic resection. Biopsied multiple times  . CORONARY ANGIOPLASTY WITH STENT PLACEMENT    .  FLEXIBLE SIGMOIDOSCOPY    06/13/2006   Essentially normal residual rectum status post total colectomy. anastomosis at 25cm.  Focal area of abnormality adjacent to suture, as described above, likely not significant, suspect granulation tissue, biopsied.  Anastomosis otherwise appeared normal.  Distal terminal ileum appeared normal  . FLEXIBLE SIGMOIDOSCOPY N/A 01/13/2015   Procedure: FLEXIBLE SIGMOIDOSCOPY;  Surgeon: Daneil Dolin, MD;  [facility];  Service: Endoscopy;  Laterality: N/A;  735am  . INGUINAL HERNIA REPAIR    . MULTIPLE EXTRACTIONS WITH ALVEOLOPLASTY N/A 04/09/2013   Procedure: Extraction of  tooth #'s 1,2,4,5,6,7,8,9,10,11,12,13,17,18,20,21,22,23,27,28, 29,30,31, and 32 with alveoloplasty.;  Surgeon: Lenn Cal, DDS;  Location: [facility];  Service: Oral Surgery;  Laterality: N/A;  . PERMANENT PACEMAKER INSERTION N/A 02/09/2013   Procedure: PERMANENT PACEMAKER INSERTION;  Surgeon: Evans Lance, MD;  Location[facility] CATH LAB;  Service: Cardiovascular;  Laterality: N/A;  . SUBTOTAL COLECTOMY  09/01/2005   subtotal colectomy  . TEMPORARY PACEMAKER INSERTION Right 02/09/2013   Procedure: TEMPORARY PACEMAKER INSERTION;  Surgeon: Sinclair Grooms, MD;  Location: [facility] CATH LAB;  Service: Cardiovascular;  Laterality: Right;     Current Outpatient Prescriptions  Medication Sig Dispense Refill  . acetaminophen (TYLENOL) 500 MG tablet Take 1 tablet by mouth as needed.    [name] albuterol (PROVENTIL HFA;VENTOLIN HFA) 108 (90 BASE) MCG/ACT inhaler Inhale 2 puffs into the lungs every 6 (six) hours as needed. For shortness of breath    . albuterol (PROVENTIL) (2.5 MG/3ML) 0.083% nebulizer solution Take 2.5 mg by nebulization every 6 (six) hours as needed. For shortness of breath    . aspirin EC 81 MG tablet Take 1 tablet by mouth daily.    . carvedilol (COREG) 6.25 MG tablet Take 6.25 mg by mouth 2 (two) times daily with a meal.     . furosemide (LASIX) 40 MG tablet TAKE 40 MG (1 TABLET) IN THE AM AND TAKE 20 MG ( 1/2 TABLET) IN THE EVENING. 135 tablet 3  . gabapentin (NEURONTIN) 300 MG capsule Take 300 mg by mouth at bedtime.    . insulin glargine (LANTUS) 100 UNIT/ML injection Inject 20 Units into the skin 2 (two) times daily.     [name] lisinopril (PRINIVIL) 10 MG tablet Take 1 tablet (10 mg total) by mouth daily. 30 tablet 12  . loratadine (CLARITIN) 10 MG tablet Take 10 mg by mouth daily. As needed    . magnesium oxide (MAG-OX) 400 MG tablet Take 400 mg by mouth 2 (two) times daily.    . metFORMIN (GLUCOPHAGE) 500 MG tablet Take 1,000 mg by mouth 2 (two) times daily with a meal.    . metolazone  (ZAROXOLYN) 2.5 MG tablet Take 2.5 mg on Sunday and Wednesday ONLY 24 tablet 3  . nitroGLYCERIN (NITROSTAT) 0.4 MG SL tablet Place 0.4 mg under the tongue every 5 (five) minutes x 3 doses as needed. For chest pain    . promethazine (PHENERGAN) 25 MG tablet Take one tablet by mouth 4 times daily as needed for nausea    . sertraline (ZOLOFT) 50 MG tablet Take 50 mg by mouth daily.    . simvastatin (ZOCOR) 80 MG tablet Take 40 mg by mouth at bedtime.    [name] tiZANidine (ZANAFLEX) 4 MG tablet Take 4 mg by mouth 3 (three) times daily.    . traMADol (ULTRAM) 50 MG tablet Take 50 mg by mouth every 6 (six) hours as needed.    . warfarin (COUMADIN) 4 MG tablet  TAKE 2 TABLETS EVERY DAY  EXCEPT TAKE 1 AND 1/2 TABLETS  ON  SUNDAYS,  TUESDAYS AND THURSDAYS 163 tablet 3   No current facility-administered medications for this visit.     Allergies:   Vancomycin    Social History:  The patient  reports that he has been smoking Cigarettes.  He started smoking about 45 years ago. He has a 40.00 pack-year smoking history. He has never used smokeless tobacco. He reports that he does not drink alcohol or use drugs.   Family History:  The patient's family history includes Colon cancer in his mother; Diabetes in his brother; Heart disease in his brother, father, and mother; Hypertension in his brother.    ROS: All other systems are reviewed and negative. Unless otherwise mentioned in H&P    PHYSICAL EXAM: VS:  BP (!) 142/80   Pulse 61   Ht 6' (1.829 m)   Wt 178 lb (80.7 kg)   SpO2 94%   BMI 24.14 kg/m  , BMI Body mass index is 24.14 kg/m. GEN: Well nourished, well developed, in no acute distress  HEENT: normal  Neck: no JVD, carotid bruits, or masses Cardiac: RRR; no murmurs, rubs, or gallops,no edema  Respiratory:  Bibasilar crackles, no coughing. GI: soft, nontender, nondistended, + BS MS: no deformity or atrophy  Skin: warm and dry, no rash Neuro:  Strength and sensation are intact Psych: euthymic  mood, full affect  Recent Labs: 08/09/2016: BUN 23; Creatinine, Ser 1.28; Potassium 6.2; Sodium 135    Lipid Panel    Component Value Date/Time   CHOL 108 01/27/2014 0623   TRIG 100 01/27/2014 0623   HDL 46 01/27/2014 0623   CHOLHDL 2.3 01/27/2014 0623   VLDL 20 01/27/2014 0623   LDLCALC 42 01/27/2014 0623      Wt Readings from Last 3 Encounters:  08/30/16 178 lb (80.7 kg)  07/26/16 174 lb (78.9 kg)  07/12/16 188 lb (85.3 kg)      Other studies Reviewed: Echocardiogram 02-12-2014 Left ventricle: The cavity size was moderately dilated. Wall thickness was normal. Systolic function was severely reduced. The estimated ejection fraction was in the range of 25% to 30%. Diffuse hypokinesis. There is akinesis of the mid-distalanteroseptal and apical myocardium. Features are consistent with a pseudonormal left ventricular filling pattern, with concomitant abnormal relaxation and increased filling pressure (grade 2 diastolic dysfunction). - Aortic valve: Trivial regurgitation. Valve area: 2.02cm^2 (Vmax). - Mitral valve: Mild regurgitation. - Left atrium: The atrium was severely dilated.  Remote Pacemaker Check 07/08/2016 Conclusion   CRT-D device check in office. Thresholds and sensing consistent with previous device measurements. Lead impedance trends stable over time. 2 ventricular arrhythmia episodes recorded, NSVT. Patient bi-ventricularly pacing 100% of the time. Device  programmed with appropriate safety margins. Heart failure diagnostics reviewed and trends are stable for patient. No changes made this session. Estimated longevity 11yrs. ROV with DC in 73mo and GT in 29moKristen Landon, RN     ASSESSMENT AND PLAN:  1.  Ischemic cardiomyopathy: Most recent echocardiogram was in 2015 with an EF of 20% to 30%. He remains on lisinopril, metolazone, carvedilol 6.25 mg twice a day, Lasix 40 mg in the morning and 20 mg in the evening. There is no evidence of fluid  overload on exam today. He will continue current medication regimen with the BMET prior to next appointment 3 months.  2. ICD in situ: It has been recently interrogated remotely and October 2017. He is telling me now that he  hears it beeping twice a day. I am going to have him seen in person in the pacemaker clinic within a week for interrogation and adjustment as necessary. The patient is asymptomatic during the time it is beeping and has no complaints of racing heart rate syncope or chest discomfort. He has not felt the pacemaker discharge.  3. Hypercholesterolemia: He will remain on statin therapy.  4. Atrial fibrillation: Heart rate is currently well-controlled. He will continue on Coumadin therapy. No changes in his medication regimen at this time.   Current medicines are reviewed at length with the patient today.    Labs/ tests ordered today include: BMET  No orders of the defined types were placed in this encounter.    Disposition:   FU with pacemaker clinic in 10 days, cardiology in 3 months. Ongoing appointments with Dr. Lovena Le on prescheduled protocol Signed, Jory Sims, NP  08/30/2016 3:52 PM    [facility] [address] [place], [place] [address] Phone: [phone]; Fax: [phone]

## 2016-08-30 NOTE — Patient Instructions (Signed)
Your physician recommends that you schedule a follow-up appointment in: 3 Months with Jory Sims, NP.   Your physician recommends that you continue on your current medications as directed. Please refer to the Current Medication list given to you today.   Your physician recommends that you return for lab work in: 3 Months just before your next visit.   If you need a refill on your cardiac medications before your next appointment, please call your pharmacy.  Thank you for choosing [facility]!

## 2016-09-06 ENCOUNTER — Other Ambulatory Visit (HOSPITAL_COMMUNITY): Payer: Self-pay | Admitting: Pulmonary Disease

## 2016-09-06 ENCOUNTER — Ambulatory Visit (HOSPITAL_COMMUNITY)
Admission: RE | Admit: 2016-09-06 | Discharge: 2016-09-06 | Disposition: A | Discharge status: Home / Self Care | Payer: Commercial Managed Care - HMO | Source: Ambulatory Visit | Attending: Pulmonary Disease | Admitting: Pulmonary Disease

## 2016-09-06 DIAGNOSIS — R918 Other nonspecific abnormal finding of lung field: Secondary | ICD-10-CM | POA: Insufficient documentation

## 2016-09-06 DIAGNOSIS — R05 Cough: Secondary | ICD-10-CM | POA: Diagnosis not present

## 2016-09-06 DIAGNOSIS — R0602 Shortness of breath: Secondary | ICD-10-CM | POA: Diagnosis not present

## 2016-09-06 DIAGNOSIS — F17200 Nicotine dependence, unspecified, uncomplicated: Secondary | ICD-10-CM | POA: Diagnosis not present

## 2016-09-06 DIAGNOSIS — Z95810 Presence of automatic (implantable) cardiac defibrillator: Secondary | ICD-10-CM | POA: Diagnosis not present

## 2016-09-09 ENCOUNTER — Ambulatory Visit (HOSPITAL_COMMUNITY)
Admission: RE | Admit: 2016-09-09 | Discharge: 2016-09-09 | Disposition: A | Discharge status: Home / Self Care | Payer: Commercial Managed Care - HMO | Source: Ambulatory Visit | Attending: Adult Health | Admitting: Adult Health

## 2016-09-09 ENCOUNTER — Other Ambulatory Visit: Payer: Self-pay

## 2016-09-09 ENCOUNTER — Ambulatory Visit (INDEPENDENT_AMBULATORY_CARE_PROVIDER_SITE_OTHER): Payer: Commercial Managed Care - HMO | Admitting: *Deleted

## 2016-09-09 DIAGNOSIS — I2510 Atherosclerotic heart disease of native coronary artery without angina pectoris: Secondary | ICD-10-CM | POA: Diagnosis not present

## 2016-09-09 DIAGNOSIS — I442 Atrioventricular block, complete: Secondary | ICD-10-CM | POA: Diagnosis not present

## 2016-09-09 DIAGNOSIS — F17200 Nicotine dependence, unspecified, uncomplicated: Secondary | ICD-10-CM | POA: Diagnosis not present

## 2016-09-09 DIAGNOSIS — Z95810 Presence of automatic (implantable) cardiac defibrillator: Secondary | ICD-10-CM

## 2016-09-09 DIAGNOSIS — J90 Pleural effusion, not elsewhere classified: Secondary | ICD-10-CM | POA: Diagnosis not present

## 2016-09-09 DIAGNOSIS — I700 Atherosclerosis of aorta: Secondary | ICD-10-CM | POA: Insufficient documentation

## 2016-09-09 DIAGNOSIS — Z950 Presence of cardiac pacemaker: Secondary | ICD-10-CM | POA: Diagnosis not present

## 2016-09-09 DIAGNOSIS — J449 Chronic obstructive pulmonary disease, unspecified: Secondary | ICD-10-CM | POA: Insufficient documentation

## 2016-09-09 DIAGNOSIS — E119 Type 2 diabetes mellitus without complications: Secondary | ICD-10-CM | POA: Insufficient documentation

## 2016-09-09 LAB — CUP PACEART INCLINIC DEVICE CHECK
Brady Statistic RV Percent Paced: 99 %
HIGH POWER IMPEDANCE MEASURED VALUE: 53 Ohm
HIGH POWER IMPEDANCE MEASURED VALUE: 77 Ohm
Implantable Lead Implant Date: 20140509
Implantable Lead Implant Date: 20140509
Implantable Lead Location: 753858
Implantable Lead Location: 753860
Implantable Lead Model: 4555
Implantable Lead Serial Number: 118610
Implantable Lead Serial Number: 29354050
Implantable Pulse Generator Implant Date: 20140509
Lead Channel Impedance Value: 637 Ohm
Lead Channel Pacing Threshold Pulse Width: 0.4 ms
Lead Channel Sensing Intrinsic Amplitude: 4.1 mV
Lead Channel Setting Pacing Amplitude: 2.5 V
Lead Channel Setting Pacing Pulse Width: 0.4 ms
Lead Channel Setting Pacing Pulse Width: 0.4 ms
MDC IDC LEAD IMPLANT DT: 20140509
MDC IDC LEAD LOCATION: 753859
MDC IDC LEAD MODEL: 293
MDC IDC LEAD MODEL: 4136
MDC IDC LEAD SERIAL: 207706
MDC IDC MSMT LEADCHNL LV IMPEDANCE VALUE: 2164 Ohm
MDC IDC MSMT LEADCHNL LV PACING THRESHOLD AMPLITUDE: 1.1 V
MDC IDC MSMT LEADCHNL LV PACING THRESHOLD PULSEWIDTH: 0.4 ms
MDC IDC MSMT LEADCHNL RA IMPEDANCE VALUE: 579 Ohm
MDC IDC MSMT LEADCHNL RA PACING THRESHOLD AMPLITUDE: 0.6 V
MDC IDC MSMT LEADCHNL RV PACING THRESHOLD AMPLITUDE: 0.5 V
MDC IDC MSMT LEADCHNL RV PACING THRESHOLD PULSEWIDTH: 0.4 ms
MDC IDC SESS DTM: 20171207050000
MDC IDC SET LEADCHNL LV SENSING SENSITIVITY: 1 mV
MDC IDC SET LEADCHNL RA PACING AMPLITUDE: 2 V
MDC IDC SET LEADCHNL RV PACING AMPLITUDE: 2.5 V
MDC IDC SET LEADCHNL RV SENSING SENSITIVITY: 0.6 mV
Pulse Gen Serial Number: 950230

## 2016-09-09 NOTE — Progress Notes (Signed)
CRT-D device check in office for auditory alert. Thresholds and sensing consistent with previous device measurements. RA and RV lead impedance trends stable over time. LV lead impedance trending up since mid-07/2016, LV tip-LV ring measures 2164ohms, LVtip-RV coil measures 1960ohms. CXR ordered, LV lead impedance beep alert reprogrammed off. Noted that pt now in SR, previously persistent AF/AFl, reprogrammed mode to DDDR from VVIR, RA output fixed at 2.0V @ 0.14ms, other settings per 11/2014 programming. 1 NSVT episode recorded--4bts. Patient bi-ventricularly pacing 99% of the time. Device programmed with appropriate safety margins. Estimated longevity 8 years. Patient agreeable to [employer], will enroll and order monitor and cellular adapter. Patient education completed including shock plan. ROV with GT/R on 09/17/16 to discuss LV lead.

## 2016-09-10 ENCOUNTER — Telehealth: Payer: Self-pay

## 2016-09-10 DIAGNOSIS — I5022 Chronic systolic (congestive) heart failure: Secondary | ICD-10-CM

## 2016-09-10 NOTE — Telephone Encounter (Addendum)
-----   Message from Lendon Colonel, NP sent at 09/10/2016  6:42 AM EST ----- He is already on metolazone 2.5 mg twice a week. May increase to three times a week. Have him take the Sunday dose today, and then again on Sunday as well.    Notes Recorded by Lendon Colonel, NP on 09/10/2016 at 6:40 AM EST Due to CXR to evaluate lead placement, he was found to be in CHF. Please increase his Lasix to 40 mg BID for 4 days. Then reduce back to 40 mg in am and 20 mg in pm. If he does not lose weight or breathing worsens he is to call us.. May need to give one dose of metolazone if this occurs. Reinforce low sodium diet. Repeat BMET in 3 days. Pacemaker lead is in stable position.       I spoke with pt,he will increase his lasix for 4 days, take extra metolazone now and again Sunday and repeat bmet next Friday 09/17/16.I told him to weigh self daily and if he is not losing weight,or feels worse, call us. He repeated instructions back to me.

## 2016-09-13 ENCOUNTER — Ambulatory Visit (INDEPENDENT_AMBULATORY_CARE_PROVIDER_SITE_OTHER): Payer: Commercial Managed Care - HMO | Admitting: *Deleted

## 2016-09-13 DIAGNOSIS — I4891 Unspecified atrial fibrillation: Secondary | ICD-10-CM

## 2016-09-13 DIAGNOSIS — Z5181 Encounter for therapeutic drug level monitoring: Secondary | ICD-10-CM | POA: Diagnosis not present

## 2016-09-13 LAB — POCT INR: INR: 1.5

## 2016-09-16 ENCOUNTER — Telehealth: Payer: Self-pay | Admitting: *Deleted

## 2016-09-16 NOTE — Telephone Encounter (Signed)
Spoke with patient regarding rescheduling appointment with Dr. Lovena Le in [place] from tomorrow, 12/15 at 10:30am to 8:00am.  Patient is agreeable to 8:00am appointment time and denies additional questions or concerns at this time.

## 2016-09-17 ENCOUNTER — Encounter: Payer: Self-pay | Admitting: Internal Medicine

## 2016-09-17 ENCOUNTER — Other Ambulatory Visit (HOSPITAL_COMMUNITY)
Admission: RE | Admit: 2016-09-17 | Discharge: 2016-09-17 | Disposition: A | Discharge status: Home / Self Care | Payer: Commercial Managed Care - HMO | Source: Ambulatory Visit | Attending: Adult Health | Admitting: Adult Health

## 2016-09-17 ENCOUNTER — Ambulatory Visit (INDEPENDENT_AMBULATORY_CARE_PROVIDER_SITE_OTHER): Payer: Commercial Managed Care - HMO | Admitting: Internal Medicine

## 2016-09-17 VITALS — BP 128/64 | HR 64 | Ht 72.0 in | Wt 181.0 lb

## 2016-09-17 DIAGNOSIS — I5022 Chronic systolic (congestive) heart failure: Secondary | ICD-10-CM

## 2016-09-17 DIAGNOSIS — Z95810 Presence of automatic (implantable) cardiac defibrillator: Secondary | ICD-10-CM | POA: Diagnosis not present

## 2016-09-17 LAB — BASIC METABOLIC PANEL
Anion gap: 7 (ref 5–15)
BUN: 17 mg/dL (ref 6–20)
CO2: 30 mmol/L (ref 22–32)
CREATININE: 1.13 mg/dL (ref 0.61–1.24)
Calcium: 9.1 mg/dL (ref 8.9–10.3)
Chloride: 100 mmol/L — ABNORMAL LOW (ref 101–111)
GFR calc Af Amer: >60 mL/min (ref >60)
GLUCOSE: 209 mg/dL — AB (ref 65–99)
POTASSIUM: 5.5 mmol/L — AB (ref 3.5–5.1)
SODIUM: 137 mmol/L (ref 135–145)

## 2016-09-17 NOTE — Patient Instructions (Signed)
Your physician recommends that you schedule a follow-up appointment in: 3 Month with Dr. Lovena Le.  Your physician recommends that you continue on your current medications as directed. Please refer to the Current Medication list given to you today.  If you need a refill on your cardiac medications before your next appointment, please call your pharmacy.  Thank you for choosing [facility]!

## 2016-09-17 NOTE — Progress Notes (Signed)
HPI [name] returns today for followup. He is a pleasant 60 yo man with an ICM, CHB, s/p BiV ICD, who has developed atrial fib/flutter in the interim. He is asymptomatic. He denies chest pain or sob. No edema or palpitations. He c/o numbness in his legs from neuropathy. On remote monitoring it has been noted that his LV pacing impedence is increasing. Allergies  Allergen Reactions  . Vancomycin Itching and Rash     Current Outpatient Prescriptions  Medication Sig Dispense Refill  . acetaminophen (TYLENOL) 500 MG tablet Take 1 tablet by mouth as needed.    [name] albuterol (PROVENTIL HFA;VENTOLIN HFA) 108 (90 BASE) MCG/ACT inhaler Inhale 2 puffs into the lungs every 6 (six) hours as needed. For shortness of breath    . albuterol (PROVENTIL) (2.5 MG/3ML) 0.083% nebulizer solution Take 2.5 mg by nebulization every 6 (six) hours as needed. For shortness of breath    . aspirin EC 81 MG tablet Take 1 tablet by mouth daily.    . carvedilol (COREG) 6.25 MG tablet Take 6.25 mg by mouth 2 (two) times daily with a meal.     . furosemide (LASIX) 40 MG tablet TAKE 40 MG (1 TABLET) IN THE AM AND TAKE 20 MG ( 1/2 TABLET) IN THE EVENING. 135 tablet 3  . gabapentin (NEURONTIN) 300 MG capsule Take 300 mg by mouth at bedtime.    . insulin glargine (LANTUS) 100 UNIT/ML injection Inject 20 Units into the skin 2 (two) times daily.     [name] lisinopril (PRINIVIL) 10 MG tablet Take 1 tablet (10 mg total) by mouth daily. 30 tablet 12  . loratadine (CLARITIN) 10 MG tablet Take 10 mg by mouth daily. As needed    . magnesium oxide (MAG-OX) 400 MG tablet Take 400 mg by mouth 2 (two) times daily.    . metFORMIN (GLUCOPHAGE) 500 MG tablet Take 1,000 mg by mouth 2 (two) times daily with a meal.    . metolazone (ZAROXOLYN) 2.5 MG tablet Take 2.5 mg on Sunday and Wednesday ONLY 24 tablet 3  . nitroGLYCERIN (NITROSTAT) 0.4 MG SL tablet Place 0.4 mg under the tongue every 5 (five) minutes x 3 doses as needed. For chest pain     . promethazine (PHENERGAN) 25 MG tablet Take one tablet by mouth 4 times daily as needed for nausea    . sertraline (ZOLOFT) 50 MG tablet Take 50 mg by mouth daily.    . simvastatin (ZOCOR) 80 MG tablet Take 40 mg by mouth at bedtime.    [name] tiZANidine (ZANAFLEX) 4 MG tablet Take 4 mg by mouth 3 (three) times daily.    . traMADol (ULTRAM) 50 MG tablet Take 50 mg by mouth every 6 (six) hours as needed.    . warfarin (COUMADIN) 4 MG tablet TAKE 2 TABLETS EVERY DAY  EXCEPT TAKE 1 AND 1/2 TABLETS  ON  SUNDAYS,  TUESDAYS AND THURSDAYS 163 tablet 3   No current facility-administered medications for this visit.      Past Medical History:  Diagnosis Date  . Anemia, normocytic normochromic 2008   2008  . Arteriosclerotic cardiovascular disease (ASCVD)    Acute MI in 2006->  LAD stent, EF-37%  . Arthritis    Hx: of in hands  . Automatic implantable cardioverter-defibrillator in situ   . Carpal tunnel syndrome   . Colon cancer ([place]) 2006   Partial colectomy in 2006  . Complete heart block ([place])    S/P ICD 02/09/13  .  COPD (chronic obstructive pulmonary disease) ([place])   . Coronary artery disease   . Diabetes mellitus, type II ([place])    With peripheral neuropathy  . Edema    venous insufficiency  . H/O alcohol dependence ([place])   . H/O hiatal hernia   . Hyperlipidemia   . Hypertension   . Myocardial infarction    Hx: of 2006  . Neuropathy in diabetes ([place])    Hx: of  . Pacemaker   . Pneumonia 01/2012   01/2012; subsequent admission for chest pain  . Shortness of breath    Hx: of   . Tobacco abuse     ROS:   All systems reviewed and negative except as noted in the HPI.   Past Surgical History:  Procedure Laterality Date  . ANKLE SURGERY     Right  . Biventricular ICD Placement  02/09/2013   Dr. Lovena Le  . COLONOSCOPY  08/17/2005   Pancolonic diverticula/Multiple rectal polyps removed as described above. The larger of the two likely responsible for intermittent hematochezia/  Multiple polyps at the hepatic flexure resected with a snare./ Large polypoid lesion growing out of the base of the cecum, just behind the  ileocecal valve. This was not felt to be amendable to endoscopic resection. Biopsied multiple times  . CORONARY ANGIOPLASTY WITH STENT PLACEMENT    . FLEXIBLE SIGMOIDOSCOPY    06/13/2006   Essentially normal residual rectum status post total colectomy. anastomosis at 25cm.  Focal area of abnormality adjacent to suture, as described above, likely not significant, suspect granulation tissue, biopsied.  Anastomosis otherwise appeared normal.  Distal terminal ileum appeared normal  . FLEXIBLE SIGMOIDOSCOPY N/A 01/13/2015   Procedure: FLEXIBLE SIGMOIDOSCOPY;  Surgeon: Daneil Dolin, MD;  [facility];  Service: Endoscopy;  Laterality: N/A;  735am  . INGUINAL HERNIA REPAIR    . MULTIPLE EXTRACTIONS WITH ALVEOLOPLASTY N/A 04/09/2013   Procedure: Extraction of tooth #'s 1,2,4,5,6,7,8,9,10,11,12,13,17,18,20,21,22,23,27,28, 29,30,31, and 32 with alveoloplasty.;  Surgeon: Lenn Cal, DDS;  Location: [facility];  Service: Oral Surgery;  Laterality: N/A;  . PERMANENT PACEMAKER INSERTION N/A 02/09/2013   Procedure: PERMANENT PACEMAKER INSERTION;  Surgeon: Evans Lance, MD;  Location: [facility] CATH LAB;  Service: Cardiovascular;  Laterality: N/A;  . SUBTOTAL COLECTOMY  09/01/2005   subtotal colectomy  . TEMPORARY PACEMAKER INSERTION Right 02/09/2013   Procedure: TEMPORARY PACEMAKER INSERTION;  Surgeon: Sinclair Grooms, MD;  Location[facility] LAB;  Service: Cardiovascular;  Laterality: Right;     Family History  Problem Relation Age of Onset  . Colon cancer Mother     Leona Esperanza, ?>age 12.  [name] Heart disease Mother   . Heart disease Father   . Hypertension Brother   . Heart disease Brother   . Diabetes Brother   . Liver disease Neg Hx      Social History   Social History  . Marital status: Married    Spouse name: N/A  . Number of children: 1  . Years of  education: N/A   Occupational History  . Parts Manager     Disabled   Social History Main Topics  . Smoking status: Current Every Day Smoker    Packs/day: 1.00    Years: 40.00    Types: Cigarettes    Start date: 07/13/1971  . Smokeless tobacco: Never Used  . Alcohol use No     Comment: Quit 2005 from a 6 pack a day use  . Drug use: No  . Sexual  activity: No   Other Topics Concern  . Not on file   Social History Narrative  . No narrative on file     BP 128/64   Pulse 64   Ht 6' (1.829 m)   Wt 181 lb (82.1 kg)   SpO2 92%   BMI 24.55 kg/m   Physical Exam:  stable appearing middle aged man, NAD HEENT: Unremarkable Neck:  7 cm JVD, no thyromegally Back:  No CVA tenderness Lungs:  Clear with no wheezes HEART:  Regular rate rhythm, no murmurs, no rubs, no clicks Abd:  soft, positive bowel sounds, no organomegally, no rebound, no guarding Ext:  2 plus pulses, no edema, no cyanosis, no clubbing Skin:  No rashes no nodules Neuro:  CN II through XII intact, motor grossly intact   DEVICE  Normal device function except for an elevated LV pacing impedence.  See PaceArt for details.   Assess/Plan: 1. Chronic systolic heart failure - his symptoms remain class 2. He will continue his current meds. I have encouraged him to increase his physical activity. 2. CAD - he denies anginal symptoms. He will continue his current meds. 3. ICD - his [place] Sci BiV ICD is working normally except that his LV lead impedence is elevated. Capture is still satisfactory. I have cautioned the patient that we may have to revise his lead in the future. I will see him back in 3 months to reassess his LV lead.  Mikle Bosworth.D

## 2016-09-29 ENCOUNTER — Ambulatory Visit (INDEPENDENT_AMBULATORY_CARE_PROVIDER_SITE_OTHER): Payer: Commercial Managed Care - HMO | Admitting: *Deleted

## 2016-09-29 DIAGNOSIS — I4891 Unspecified atrial fibrillation: Secondary | ICD-10-CM | POA: Diagnosis not present

## 2016-09-29 DIAGNOSIS — Z5181 Encounter for therapeutic drug level monitoring: Secondary | ICD-10-CM | POA: Diagnosis not present

## 2016-09-29 LAB — POCT INR: INR: 1.5

## 2016-10-13 ENCOUNTER — Ambulatory Visit (INDEPENDENT_AMBULATORY_CARE_PROVIDER_SITE_OTHER): Payer: Medicare HMO | Admitting: *Deleted

## 2016-10-13 DIAGNOSIS — I4891 Unspecified atrial fibrillation: Secondary | ICD-10-CM

## 2016-10-13 DIAGNOSIS — Z5181 Encounter for therapeutic drug level monitoring: Secondary | ICD-10-CM

## 2016-10-13 LAB — POCT INR: INR: 1.7

## 2016-10-27 ENCOUNTER — Ambulatory Visit (INDEPENDENT_AMBULATORY_CARE_PROVIDER_SITE_OTHER): Payer: Medicare HMO | Admitting: *Deleted

## 2016-10-27 DIAGNOSIS — Z5181 Encounter for therapeutic drug level monitoring: Secondary | ICD-10-CM | POA: Diagnosis not present

## 2016-10-27 DIAGNOSIS — I4891 Unspecified atrial fibrillation: Secondary | ICD-10-CM

## 2016-10-27 LAB — POCT INR: INR: 2.4

## 2016-11-24 ENCOUNTER — Ambulatory Visit (INDEPENDENT_AMBULATORY_CARE_PROVIDER_SITE_OTHER): Payer: Medicare HMO | Admitting: *Deleted

## 2016-11-24 ENCOUNTER — Other Ambulatory Visit (HOSPITAL_COMMUNITY)
Admission: RE | Admit: 2016-11-24 | Discharge: 2016-11-24 | Disposition: A | Discharge status: Home / Self Care | Payer: Commercial Managed Care - HMO | Source: Ambulatory Visit | Attending: Adult Health | Admitting: Adult Health

## 2016-11-24 DIAGNOSIS — Z79899 Other long term (current) drug therapy: Secondary | ICD-10-CM | POA: Insufficient documentation

## 2016-11-24 DIAGNOSIS — I4891 Unspecified atrial fibrillation: Secondary | ICD-10-CM | POA: Diagnosis not present

## 2016-11-24 DIAGNOSIS — Z5181 Encounter for therapeutic drug level monitoring: Secondary | ICD-10-CM

## 2016-11-24 LAB — BASIC METABOLIC PANEL
ANION GAP: 6 (ref 5–15)
BUN: 12 mg/dL (ref 6–20)
CO2: 30 mmol/L (ref 22–32)
Calcium: 8.7 mg/dL — ABNORMAL LOW (ref 8.9–10.3)
Chloride: 101 mmol/L (ref 101–111)
Creatinine, Ser: 1.02 mg/dL (ref 0.61–1.24)
GFR calc Af Amer: >60 mL/min (ref >60)
GFR calc non Af Amer: >60 mL/min (ref >60)
GLUCOSE: 243 mg/dL — AB (ref 65–99)
POTASSIUM: 4.5 mmol/L (ref 3.5–5.1)
Sodium: 137 mmol/L (ref 135–145)

## 2016-11-24 LAB — POCT INR: INR: 2.1

## 2016-11-25 ENCOUNTER — Telehealth: Payer: Self-pay | Admitting: *Deleted

## 2016-11-25 NOTE — Telephone Encounter (Signed)
Patient notified

## 2016-11-25 NOTE — Telephone Encounter (Signed)
-----   Message from Lendon Colonel, NP sent at 11/25/2016 11:02 AM EST ----- Labs are reviewed. Glucose elevated. Will need to follow up with PCP about this.

## 2016-11-30 ENCOUNTER — Ambulatory Visit: Payer: Self-pay | Admitting: Adult Health

## 2016-12-09 ENCOUNTER — Encounter: Payer: Self-pay | Admitting: Internal Medicine

## 2016-12-09 ENCOUNTER — Ambulatory Visit (INDEPENDENT_AMBULATORY_CARE_PROVIDER_SITE_OTHER): Payer: Medicare HMO | Admitting: Internal Medicine

## 2016-12-09 VITALS — BP 150/74 | HR 78 | Ht 72.0 in | Wt 184.0 lb

## 2016-12-09 DIAGNOSIS — T82118S Breakdown (mechanical) of other cardiac electronic device, sequela: Secondary | ICD-10-CM | POA: Diagnosis not present

## 2016-12-09 DIAGNOSIS — I5022 Chronic systolic (congestive) heart failure: Secondary | ICD-10-CM

## 2016-12-09 LAB — CUP PACEART INCLINIC DEVICE CHECK
Date Time Interrogation Session: 20180308050000
HIGH POWER IMPEDANCE MEASURED VALUE: 79 Ohm
HighPow Impedance: 53 Ohm
Implantable Lead Implant Date: 20140509
Implantable Lead Implant Date: 20140509
Implantable Lead Location: 753858
Implantable Lead Location: 753859
Implantable Lead Model: 293
Implantable Lead Model: 4136
Implantable Lead Serial Number: 29354050
Implantable Pulse Generator Implant Date: 20140509
Lead Channel Impedance Value: 573 Ohm
Lead Channel Pacing Threshold Amplitude: 0.6 V
Lead Channel Pacing Threshold Pulse Width: 0.4 ms
Lead Channel Pacing Threshold Pulse Width: 1 ms
Lead Channel Setting Pacing Amplitude: 2 V
Lead Channel Setting Sensing Sensitivity: 0.6 mV
Lead Channel Setting Sensing Sensitivity: 1 mV
MDC IDC LEAD IMPLANT DT: 20140509
MDC IDC LEAD LOCATION: 753860
MDC IDC LEAD SERIAL: 118610
MDC IDC LEAD SERIAL: 207706
MDC IDC MSMT LEADCHNL LV PACING THRESHOLD AMPLITUDE: 0.5 V
MDC IDC MSMT LEADCHNL RA PACING THRESHOLD AMPLITUDE: 0.7 V
MDC IDC MSMT LEADCHNL RA PACING THRESHOLD PULSEWIDTH: 0.4 ms
MDC IDC MSMT LEADCHNL RA SENSING INTR AMPL: 4 mV
MDC IDC MSMT LEADCHNL RV IMPEDANCE VALUE: 678 Ohm
MDC IDC PG SERIAL: 950230
MDC IDC SET LEADCHNL LV PACING PULSEWIDTH: 1 ms
MDC IDC SET LEADCHNL RA PACING AMPLITUDE: 2 V
MDC IDC SET LEADCHNL RV PACING AMPLITUDE: 2.5 V
MDC IDC SET LEADCHNL RV PACING PULSEWIDTH: 0.4 ms

## 2016-12-09 NOTE — Progress Notes (Signed)
HPI [name] returns today for followup. He is a pleasant 61 yo man with an ICM, CHB, s/p BiV ICD, who has developed atrial fib/flutter in the interim. He is asymptomatic. He denies chest pain or sob. No edema or palpitations. He c/o numbness in his legs from neuropathy. On remote monitoring it has been noted that his LV pacing impedence is increasing. He has not been in the hospital since his last visit. Allergies  Allergen Reactions  . Vancomycin Itching and Rash     Current Outpatient Prescriptions  Medication Sig Dispense Refill  . acetaminophen (TYLENOL) 500 MG tablet Take 1 tablet by mouth as needed.    [name] albuterol (PROVENTIL HFA;VENTOLIN HFA) 108 (90 BASE) MCG/ACT inhaler Inhale 2 puffs into the lungs every 6 (six) hours as needed. For shortness of breath    . albuterol (PROVENTIL) (2.5 MG/3ML) 0.083% nebulizer solution Take 2.5 mg by nebulization every 6 (six) hours as needed. For shortness of breath    . aspirin EC 81 MG tablet Take 1 tablet by mouth daily.    . carvedilol (COREG) 6.25 MG tablet Take 6.25 mg by mouth 2 (two) times daily with a meal.     . furosemide (LASIX) 40 MG tablet TAKE 40 MG (1 TABLET) IN THE AM AND TAKE 20 MG ( 1/2 TABLET) IN THE EVENING. 135 tablet 3  . gabapentin (NEURONTIN) 300 MG capsule Take 300 mg by mouth at bedtime.    . insulin glargine (LANTUS) 100 UNIT/ML injection Inject 20 Units into the skin 2 (two) times daily.     [name] lisinopril (PRINIVIL) 10 MG tablet Take 1 tablet (10 mg total) by mouth daily. 30 tablet 12  . loratadine (CLARITIN) 10 MG tablet Take 10 mg by mouth daily. As needed    . magnesium oxide (MAG-OX) 400 MG tablet Take 400 mg by mouth 2 (two) times daily.    . metFORMIN (GLUCOPHAGE) 500 MG tablet Take 1,000 mg by mouth 2 (two) times daily with a meal.    . metolazone (ZAROXOLYN) 2.5 MG tablet Take 2.5 mg on Sunday and Wednesday ONLY 24 tablet 3  . nitroGLYCERIN (NITROSTAT) 0.4 MG SL tablet Place 0.4 mg under the tongue every  5 (five) minutes x 3 doses as needed. For chest pain    . promethazine (PHENERGAN) 25 MG tablet Take one tablet by mouth 4 times daily as needed for nausea    . sertraline (ZOLOFT) 50 MG tablet Take 50 mg by mouth daily.    . simvastatin (ZOCOR) 80 MG tablet Take 40 mg by mouth at bedtime.    [name] tiZANidine (ZANAFLEX) 4 MG tablet Take 4 mg by mouth 3 (three) times daily.    . traMADol (ULTRAM) 50 MG tablet Take 50 mg by mouth every 6 (six) hours as needed.    . warfarin (COUMADIN) 4 MG tablet TAKE 2 TABLETS EVERY DAY  EXCEPT TAKE 1 AND 1/2 TABLETS  ON  SUNDAYS,  TUESDAYS AND THURSDAYS 163 tablet 3   No current facility-administered medications for this visit.      Past Medical History:  Diagnosis Date  . Anemia, normocytic normochromic 2008   2008  . Arteriosclerotic cardiovascular disease (ASCVD)    Acute MI in 2006->  LAD stent, EF-37%  . Arthritis    Hx: of in hands  . Automatic implantable cardioverter-defibrillator in situ   . Carpal tunnel syndrome   . Colon cancer ([place]) 2006   Partial colectomy in 2006  .  Complete heart block ([place])    S/P ICD 02/09/13  . COPD (chronic obstructive pulmonary disease) ([place])   . Coronary artery disease   . Diabetes mellitus, type II ([place])    With peripheral neuropathy  . Edema    venous insufficiency  . H/O alcohol dependence ([place])   . H/O hiatal hernia   . Hyperlipidemia   . Hypertension   . Myocardial infarction    Hx: of 2006  . Neuropathy in diabetes ([place])    Hx: of  . Pacemaker   . Pneumonia 01/2012   01/2012; subsequent admission for chest pain  . Shortness of breath    Hx: of   . Tobacco abuse     ROS:   All systems reviewed and negative except as noted in the HPI.   Past Surgical History:  Procedure Laterality Date  . ANKLE SURGERY     Right  . Biventricular ICD Placement  02/09/2013   Dr. Lovena Le  . COLONOSCOPY  08/17/2005   Pancolonic diverticula/Multiple rectal polyps removed as described above. The larger of the two  likely responsible for intermittent hematochezia/ Multiple polyps at the hepatic flexure resected with a snare./ Large polypoid lesion growing out of the base of the cecum, just behind the  ileocecal valve. This was not felt to be amendable to endoscopic resection. Biopsied multiple times  . CORONARY ANGIOPLASTY WITH STENT PLACEMENT    . FLEXIBLE SIGMOIDOSCOPY    06/13/2006   Essentially normal residual rectum status post total colectomy. anastomosis at 25cm.  Focal area of abnormality adjacent to suture, as described above, likely not significant, suspect granulation tissue, biopsied.  Anastomosis otherwise appeared normal.  Distal terminal ileum appeared normal  . FLEXIBLE SIGMOIDOSCOPY N/A 01/13/2015   Procedure: FLEXIBLE SIGMOIDOSCOPY;  Surgeon: Daneil Dolin, MD;  [facility];  Service: Endoscopy;  Laterality: N/A;  735am  . INGUINAL HERNIA REPAIR    . MULTIPLE EXTRACTIONS WITH ALVEOLOPLASTY N/A 04/09/2013   Procedure: Extraction of tooth #'s 1,2,4,5,6,7,8,9,10,11,12,13,17,18,20,21,22,23,27,28, 29,30,31, and 32 with alveoloplasty.;  Surgeon: Lenn Cal, DDS;  Location: [facility];  Service: Oral Surgery;  Laterality: N/A;  . PERMANENT PACEMAKER INSERTION N/A 02/09/2013   Procedure: PERMANENT PACEMAKER INSERTION;  Surgeon: Evans Lance, MD;  Location: [facility] CATH LAB;  Service: Cardiovascular;  Laterality: N/A;  . SUBTOTAL COLECTOMY  09/01/2005   subtotal colectomy  . TEMPORARY PACEMAKER INSERTION Right 02/09/2013   Procedure: TEMPORARY PACEMAKER INSERTION;  Surgeon: Sinclair Grooms, MD;  Location: [facility] CATH LAB;  Service: Cardiovascular;  Laterality: Right;     Family History  Problem Relation Age of Onset  . Colon cancer Mother     Aydrien Froman, ?>age 31.  [name] Heart disease Mother   . Heart disease Father   . Hypertension Brother   . Heart disease Brother   . Diabetes Brother   . Liver disease Neg Hx      Social History   Social History  . Marital status: Married     Spouse name: N/A  . Number of children: 1  . Years of education: N/A   Occupational History  . Parts Manager     Disabled   Social History Main Topics  . Smoking status: Current Every Day Smoker    Packs/day: 1.00    Years: 40.00    Types: Cigarettes    Start date: 07/13/1971  . Smokeless tobacco: Never Used  . Alcohol use No     Comment: Quit 2005 from a 6  pack a day use  . Drug use: No  . Sexual activity: No   Other Topics Concern  . Not on file   Social History Narrative  . No narrative on file     BP (!) 150/74   Pulse 78   Ht 6' (1.829 m)   Wt 184 lb (83.5 kg)   SpO2 90%   BMI 24.95 kg/m   Physical Exam:  stable appearing middle aged diskempt appearing man, NAD HEENT: Unremarkable Neck:  7 cm JVD, no thyromegally Back:  No CVA tenderness Lungs:  Clear with no wheezes HEART:  Regular rate rhythm, no murmurs, no rubs, no clicks Abd:  soft, positive bowel sounds, no organomegally, no rebound, no guarding Ext:  2 plus pulses, no edema, no cyanosis, no clubbing Skin:  No rashes no nodules Neuro:  CN II through XII intact, motor grossly intact   DEVICE  Normal device function except for an elevated LV pacing impedence.  See PaceArt for details.   Assess/Plan: 1. Chronic systolic heart failure - his symptoms remain class 2. He will continue his current meds. I have encouraged him to increase his physical activity. 2. CAD - he denies anginal symptoms. He will continue his current meds. 3. ICD - his [place] Sci BiV ICD is working normally except that his LV lead impedence is elevated. We have reprogrammed his device today going from ring to can with good thresholds and no diaphragmatic stimulation.  4. Tobacco abuse - I have encouraged the patient to stop smoking.  Mikle Bosworth.D

## 2016-12-09 NOTE — Patient Instructions (Signed)
Your physician wants you to follow-up in: 1 Year with Dr. Lovena Le.  You will receive a reminder letter in the mail two months in advance. If you don't receive a letter, please call our office to schedule the follow-up appointment.  Remote monitoring is used to monitor your Pacemaker of ICD from home. This monitoring reduces the number of office visits required to check your device to one time per year. It allows [place] to keep an eye on the functioning of your device to ensure it is working properly. You are scheduled for a device check from home on 03/10/17. You may send your transmission at any time that day. If you have a wireless device, the transmission will be sent automatically. After your physician reviews your transmission, you will receive a postcard with your next transmission date.  Your physician recommends that you continue on your current medications as directed. Please refer to the Current Medication list given to you today.  If you need a refill on your cardiac medications before your next appointment, please call your pharmacy.  Thank you for choosing [facility]!

## 2016-12-29 ENCOUNTER — Ambulatory Visit (INDEPENDENT_AMBULATORY_CARE_PROVIDER_SITE_OTHER): Payer: Medicare HMO | Admitting: *Deleted

## 2016-12-29 DIAGNOSIS — I4891 Unspecified atrial fibrillation: Secondary | ICD-10-CM

## 2016-12-29 DIAGNOSIS — Z5181 Encounter for therapeutic drug level monitoring: Secondary | ICD-10-CM | POA: Diagnosis not present

## 2016-12-29 LAB — POCT INR: INR: 4.7

## 2017-01-10 ENCOUNTER — Ambulatory Visit (INDEPENDENT_AMBULATORY_CARE_PROVIDER_SITE_OTHER): Payer: Medicare HMO | Admitting: *Deleted

## 2017-01-10 DIAGNOSIS — I4891 Unspecified atrial fibrillation: Secondary | ICD-10-CM | POA: Diagnosis not present

## 2017-01-10 DIAGNOSIS — Z5181 Encounter for therapeutic drug level monitoring: Secondary | ICD-10-CM | POA: Diagnosis not present

## 2017-01-10 LAB — POCT INR: INR: 2

## 2017-01-31 ENCOUNTER — Ambulatory Visit (INDEPENDENT_AMBULATORY_CARE_PROVIDER_SITE_OTHER): Payer: Medicare HMO | Admitting: *Deleted

## 2017-01-31 DIAGNOSIS — Z5181 Encounter for therapeutic drug level monitoring: Secondary | ICD-10-CM

## 2017-01-31 DIAGNOSIS — I4891 Unspecified atrial fibrillation: Secondary | ICD-10-CM | POA: Diagnosis not present

## 2017-01-31 LAB — POCT INR: INR: 2.2

## 2017-03-02 ENCOUNTER — Ambulatory Visit (INDEPENDENT_AMBULATORY_CARE_PROVIDER_SITE_OTHER): Payer: Medicare HMO | Admitting: *Deleted

## 2017-03-02 DIAGNOSIS — Z5181 Encounter for therapeutic drug level monitoring: Secondary | ICD-10-CM

## 2017-03-02 DIAGNOSIS — I4891 Unspecified atrial fibrillation: Secondary | ICD-10-CM

## 2017-03-02 LAB — POCT INR: INR: 2.5

## 2017-03-02 MED ORDER — WARFARIN SODIUM 4 MG PO TABS: ORAL_TABLET | ORAL | 3 refills | Status: DC

## 2017-03-10 ENCOUNTER — Ambulatory Visit (INDEPENDENT_AMBULATORY_CARE_PROVIDER_SITE_OTHER): Payer: Medicare HMO | Admitting: *Deleted

## 2017-03-10 DIAGNOSIS — I255 Ischemic cardiomyopathy: Secondary | ICD-10-CM

## 2017-03-11 NOTE — Progress Notes (Signed)
Remote ICD transmission.   

## 2017-03-16 ENCOUNTER — Encounter: Payer: Self-pay | Admitting: Cardiology

## 2017-03-25 ENCOUNTER — Other Ambulatory Visit: Payer: Self-pay

## 2017-03-25 ENCOUNTER — Inpatient Hospital Stay (HOSPITAL_COMMUNITY)
Admission: EM | Admit: 2017-03-25 | Discharge: 2017-03-31 | DRG: 393 | Disposition: A | Discharge status: Home / Self Care | Payer: Medicare HMO | Attending: Pulmonary Disease | Admitting: Pulmonary Disease

## 2017-03-25 ENCOUNTER — Encounter (HOSPITAL_COMMUNITY): Payer: Self-pay | Admitting: Emergency Medicine

## 2017-03-25 ENCOUNTER — Emergency Department (HOSPITAL_COMMUNITY): Payer: Medicare HMO

## 2017-03-25 ENCOUNTER — Ambulatory Visit (INDEPENDENT_AMBULATORY_CARE_PROVIDER_SITE_OTHER): Payer: Medicare HMO | Admitting: Adult Health

## 2017-03-25 ENCOUNTER — Other Ambulatory Visit (HOSPITAL_COMMUNITY)
Admission: RE | Admit: 2017-03-25 | Discharge: 2017-03-25 | Disposition: A | Discharge status: Home / Self Care | Payer: Medicare HMO | Source: Ambulatory Visit | Attending: Adult Health | Admitting: Adult Health

## 2017-03-25 ENCOUNTER — Encounter: Payer: Self-pay | Admitting: Adult Health

## 2017-03-25 VITALS — BP 154/80 | HR 70 | Ht 72.0 in | Wt 191.0 lb

## 2017-03-25 DIAGNOSIS — J449 Chronic obstructive pulmonary disease, unspecified: Secondary | ICD-10-CM | POA: Diagnosis not present

## 2017-03-25 DIAGNOSIS — K2970 Gastritis, unspecified, without bleeding: Secondary | ICD-10-CM | POA: Diagnosis present

## 2017-03-25 DIAGNOSIS — I482 Chronic atrial fibrillation: Secondary | ICD-10-CM | POA: Diagnosis present

## 2017-03-25 DIAGNOSIS — R195 Other fecal abnormalities: Secondary | ICD-10-CM | POA: Diagnosis not present

## 2017-03-25 DIAGNOSIS — K2980 Duodenitis without bleeding: Secondary | ICD-10-CM | POA: Diagnosis not present

## 2017-03-25 DIAGNOSIS — I472 Ventricular tachycardia: Secondary | ICD-10-CM | POA: Diagnosis present

## 2017-03-25 DIAGNOSIS — E119 Type 2 diabetes mellitus without complications: Secondary | ICD-10-CM

## 2017-03-25 DIAGNOSIS — Z95810 Presence of automatic (implantable) cardiac defibrillator: Secondary | ICD-10-CM | POA: Diagnosis not present

## 2017-03-25 DIAGNOSIS — Z794 Long term (current) use of insulin: Secondary | ICD-10-CM

## 2017-03-25 DIAGNOSIS — D649 Anemia, unspecified: Secondary | ICD-10-CM

## 2017-03-25 DIAGNOSIS — Z881 Allergy status to other antibiotic agents status: Secondary | ICD-10-CM

## 2017-03-25 DIAGNOSIS — I43 Cardiomyopathy in diseases classified elsewhere: Secondary | ICD-10-CM

## 2017-03-25 DIAGNOSIS — K2270 Barrett's esophagus without dysplasia: Secondary | ICD-10-CM | POA: Diagnosis not present

## 2017-03-25 DIAGNOSIS — I5022 Chronic systolic (congestive) heart failure: Secondary | ICD-10-CM | POA: Diagnosis not present

## 2017-03-25 DIAGNOSIS — Z720 Tobacco use: Secondary | ICD-10-CM | POA: Diagnosis not present

## 2017-03-25 DIAGNOSIS — E7800 Pure hypercholesterolemia, unspecified: Secondary | ICD-10-CM

## 2017-03-25 DIAGNOSIS — Z7901 Long term (current) use of anticoagulants: Secondary | ICD-10-CM | POA: Diagnosis not present

## 2017-03-25 DIAGNOSIS — I110 Hypertensive heart disease with heart failure: Secondary | ICD-10-CM | POA: Diagnosis present

## 2017-03-25 DIAGNOSIS — K3189 Other diseases of stomach and duodenum: Secondary | ICD-10-CM | POA: Diagnosis not present

## 2017-03-25 DIAGNOSIS — Z85038 Personal history of other malignant neoplasm of large intestine: Secondary | ICD-10-CM | POA: Diagnosis not present

## 2017-03-25 DIAGNOSIS — E875 Hyperkalemia: Secondary | ICD-10-CM | POA: Diagnosis present

## 2017-03-25 DIAGNOSIS — K633 Ulcer of intestine: Principal | ICD-10-CM | POA: Diagnosis present

## 2017-03-25 DIAGNOSIS — K228 Other specified diseases of esophagus: Secondary | ICD-10-CM | POA: Diagnosis not present

## 2017-03-25 DIAGNOSIS — D509 Iron deficiency anemia, unspecified: Secondary | ICD-10-CM | POA: Diagnosis present

## 2017-03-25 DIAGNOSIS — I350 Nonrheumatic aortic (valve) stenosis: Secondary | ICD-10-CM | POA: Diagnosis not present

## 2017-03-25 DIAGNOSIS — I5043 Acute on chronic combined systolic (congestive) and diastolic (congestive) heart failure: Secondary | ICD-10-CM | POA: Diagnosis present

## 2017-03-25 DIAGNOSIS — K920 Hematemesis: Secondary | ICD-10-CM | POA: Diagnosis present

## 2017-03-25 DIAGNOSIS — Z9981 Dependence on supplemental oxygen: Secondary | ICD-10-CM

## 2017-03-25 DIAGNOSIS — Z7982 Long term (current) use of aspirin: Secondary | ICD-10-CM

## 2017-03-25 DIAGNOSIS — I255 Ischemic cardiomyopathy: Secondary | ICD-10-CM | POA: Diagnosis present

## 2017-03-25 DIAGNOSIS — E785 Hyperlipidemia, unspecified: Secondary | ICD-10-CM | POA: Diagnosis not present

## 2017-03-25 DIAGNOSIS — D123 Benign neoplasm of transverse colon: Secondary | ICD-10-CM | POA: Diagnosis present

## 2017-03-25 DIAGNOSIS — K621 Rectal polyp: Secondary | ICD-10-CM | POA: Diagnosis not present

## 2017-03-25 DIAGNOSIS — I4891 Unspecified atrial fibrillation: Secondary | ICD-10-CM | POA: Diagnosis present

## 2017-03-25 DIAGNOSIS — F17210 Nicotine dependence, cigarettes, uncomplicated: Secondary | ICD-10-CM | POA: Diagnosis present

## 2017-03-25 DIAGNOSIS — Z79899 Other long term (current) drug therapy: Secondary | ICD-10-CM | POA: Diagnosis not present

## 2017-03-25 DIAGNOSIS — I2510 Atherosclerotic heart disease of native coronary artery without angina pectoris: Secondary | ICD-10-CM | POA: Diagnosis present

## 2017-03-25 DIAGNOSIS — I5041 Acute combined systolic (congestive) and diastolic (congestive) heart failure: Secondary | ICD-10-CM | POA: Diagnosis not present

## 2017-03-25 DIAGNOSIS — I10 Essential (primary) hypertension: Secondary | ICD-10-CM | POA: Diagnosis present

## 2017-03-25 DIAGNOSIS — I509 Heart failure, unspecified: Secondary | ICD-10-CM | POA: Diagnosis not present

## 2017-03-25 DIAGNOSIS — R0602 Shortness of breath: Secondary | ICD-10-CM | POA: Diagnosis not present

## 2017-03-25 DIAGNOSIS — S36400A Unspecified injury of duodenum, initial encounter: Secondary | ICD-10-CM | POA: Diagnosis not present

## 2017-03-25 DIAGNOSIS — K648 Other hemorrhoids: Secondary | ICD-10-CM | POA: Diagnosis present

## 2017-03-25 LAB — CBC
HCT: 25.9 % — ABNORMAL LOW (ref 39.0–52.0)
Hemoglobin: 7.7 g/dL — ABNORMAL LOW (ref 13.0–17.0)
MCH: 23.6 pg — AB (ref 26.0–34.0)
MCHC: 29.7 g/dL — ABNORMAL LOW (ref 30.0–36.0)
MCV: 79.4 fL (ref 78.0–100.0)
PLATELETS: 203 10*3/uL (ref 150–400)
RBC: 3.26 MIL/uL — AB (ref 4.22–5.81)
RDW: 18.6 % — ABNORMAL HIGH (ref 11.5–15.5)
WBC: 5.2 10*3/uL (ref 4.0–10.5)

## 2017-03-25 LAB — I-STAT TROPONIN, ED: TROPONIN I, POC: 0.01 ng/mL (ref 0.00–0.08)

## 2017-03-25 LAB — PROTIME-INR
INR: 1.95
Prothrombin Time: 22.5 seconds — ABNORMAL HIGH (ref 11.4–15.2)

## 2017-03-25 LAB — RETICULOCYTES
RBC.: 3.3 MIL/uL — AB (ref 4.22–5.81)
RETIC CT PCT: 3 % (ref 0.4–3.1)
Retic Count, Absolute: 99 10*3/uL (ref 19.0–186.0)

## 2017-03-25 LAB — BASIC METABOLIC PANEL
Anion gap: 8 (ref 5–15)
BUN: 16 mg/dL (ref 6–20)
CALCIUM: 8.9 mg/dL (ref 8.9–10.3)
CHLORIDE: 101 mmol/L (ref 101–111)
CO2: 28 mmol/L (ref 22–32)
CREATININE: 1.15 mg/dL (ref 0.61–1.24)
GFR calc non Af Amer: >60 mL/min (ref >60)
GLUCOSE: 165 mg/dL — AB (ref 65–99)
Potassium: 4.9 mmol/L (ref 3.5–5.1)
Sodium: 137 mmol/L (ref 135–145)

## 2017-03-25 LAB — BRAIN NATRIURETIC PEPTIDE: B NATRIURETIC PEPTIDE 5: 1210 pg/mL — AB (ref 0.0–100.0)

## 2017-03-25 LAB — POC OCCULT BLOOD, ED: Fecal Occult Bld: POSITIVE — AB

## 2017-03-25 MED ORDER — INSULIN ASPART 100 UNIT/ML ~~LOC~~ SOLN
0.0000 [IU] | Freq: Three times a day (TID) | SUBCUTANEOUS | Status: DC
  Administered 2017-03-26 – 2017-03-27 (×4): 5 [IU] via SUBCUTANEOUS
  Administered 2017-03-27: 3 [IU] via SUBCUTANEOUS
  Administered 2017-03-28: 5 [IU] via SUBCUTANEOUS
  Administered 2017-03-28: 8 [IU] via SUBCUTANEOUS
  Administered 2017-03-29: 5 [IU] via SUBCUTANEOUS
  Administered 2017-03-30: 11 [IU] via SUBCUTANEOUS
  Administered 2017-03-31: 5 [IU] via SUBCUTANEOUS

## 2017-03-25 MED ORDER — FUROSEMIDE 10 MG/ML IJ SOLN
80.0000 mg | Freq: Once | INTRAMUSCULAR | Status: AC
  Administered 2017-03-25: 80 mg via INTRAVENOUS
  Filled 2017-03-25: qty 8

## 2017-03-25 MED ORDER — INSULIN ASPART 100 UNIT/ML ~~LOC~~ SOLN
0.0000 [IU] | Freq: Every day | SUBCUTANEOUS | Status: DC
  Administered 2017-03-26: 2 [IU] via SUBCUTANEOUS
  Administered 2017-03-26: 3 [IU] via SUBCUTANEOUS
  Administered 2017-03-27: 4 [IU] via SUBCUTANEOUS
  Administered 2017-03-28: 3 [IU] via SUBCUTANEOUS
  Administered 2017-03-30: 4 [IU] via SUBCUTANEOUS

## 2017-03-25 MED ORDER — FUROSEMIDE 10 MG/ML IJ SOLN
80.0000 mg | Freq: Two times a day (BID) | INTRAMUSCULAR | Status: DC
  Administered 2017-03-26 – 2017-03-28 (×5): 80 mg via INTRAVENOUS
  Filled 2017-03-25 (×6): qty 8

## 2017-03-25 MED ORDER — FUROSEMIDE 40 MG PO TABS: 40.0000 mg | ORAL_TABLET | Freq: Two times a day (BID) | ORAL | 3 refills | Status: DC

## 2017-03-25 MED ORDER — ACETAMINOPHEN 500 MG PO TABS: 500.0000 mg | ORAL_TABLET | ORAL | Status: DC | PRN

## 2017-03-25 MED ORDER — CARVEDILOL 3.125 MG PO TABS
6.2500 mg | ORAL_TABLET | Freq: Two times a day (BID) | ORAL | Status: DC
  Administered 2017-03-26 – 2017-03-31 (×10): 6.25 mg via ORAL
  Filled 2017-03-25 (×10): qty 2

## 2017-03-25 MED ORDER — TRAMADOL HCL 50 MG PO TABS
50.0000 mg | ORAL_TABLET | Freq: Four times a day (QID) | ORAL | Status: DC | PRN
  Administered 2017-03-26: 100 mg via ORAL
  Filled 2017-03-25 (×2): qty 2

## 2017-03-25 MED ORDER — ATORVASTATIN CALCIUM 40 MG PO TABS
40.0000 mg | ORAL_TABLET | Freq: Every day | ORAL | Status: DC
  Administered 2017-03-26 – 2017-03-30 (×4): 40 mg via ORAL
  Filled 2017-03-25 (×4): qty 1

## 2017-03-25 MED ORDER — SERTRALINE HCL 50 MG PO TABS
50.0000 mg | ORAL_TABLET | Freq: Every day | ORAL | Status: DC
  Administered 2017-03-26 – 2017-03-31 (×6): 50 mg via ORAL
  Filled 2017-03-25 (×6): qty 1

## 2017-03-25 MED ORDER — LISINOPRIL 10 MG PO TABS
10.0000 mg | ORAL_TABLET | Freq: Every day | ORAL | Status: DC
  Administered 2017-03-26 – 2017-03-31 (×6): 10 mg via ORAL
  Filled 2017-03-25 (×6): qty 1

## 2017-03-25 MED ORDER — POTASSIUM CHLORIDE CRYS ER 20 MEQ PO TBCR: 20.0000 meq | EXTENDED_RELEASE_TABLET | Freq: Every day | ORAL | Status: DC

## 2017-03-25 MED ORDER — ALBUTEROL SULFATE (2.5 MG/3ML) 0.083% IN NEBU
2.5000 mg | INHALATION_SOLUTION | Freq: Four times a day (QID) | RESPIRATORY_TRACT | Status: DC
  Administered 2017-03-26 (×4): 2.5 mg via RESPIRATORY_TRACT
  Filled 2017-03-25 (×4): qty 3

## 2017-03-25 MED ORDER — FUROSEMIDE 10 MG/ML IJ SOLN
40.0000 mg | Freq: Once | INTRAMUSCULAR | Status: AC
  Administered 2017-03-26: 40 mg via INTRAVENOUS
  Filled 2017-03-25: qty 4

## 2017-03-25 MED ORDER — LORATADINE 10 MG PO TABS: 10.0000 mg | ORAL_TABLET | Freq: Every day | ORAL | Status: DC | PRN

## 2017-03-25 MED ORDER — GABAPENTIN 300 MG PO CAPS
300.0000 mg | ORAL_CAPSULE | Freq: Every day | ORAL | Status: DC
  Administered 2017-03-26 – 2017-03-30 (×6): 300 mg via ORAL
  Filled 2017-03-25 (×6): qty 1

## 2017-03-25 MED ORDER — PANTOPRAZOLE SODIUM 40 MG IV SOLR
40.0000 mg | Freq: Two times a day (BID) | INTRAVENOUS | Status: DC
  Administered 2017-03-26 – 2017-03-28 (×6): 40 mg via INTRAVENOUS
  Filled 2017-03-25 (×7): qty 40

## 2017-03-25 MED ORDER — SODIUM CHLORIDE 0.9 % IV SOLN: Freq: Once | INTRAVENOUS | Status: DC

## 2017-03-25 MED ORDER — METOLAZONE 5 MG PO TABS: 2.5000 mg | ORAL_TABLET | ORAL | Status: DC

## 2017-03-25 MED ORDER — POTASSIUM CHLORIDE CRYS ER 20 MEQ PO TBCR: 20.0000 meq | EXTENDED_RELEASE_TABLET | Freq: Every day | ORAL | 3 refills | Status: DC

## 2017-03-25 NOTE — Progress Notes (Signed)
Patient found to be anemic with Hgb of 7.7, on coumadin. He is also found to have elevated BNP > 1,200 with evidence of decompensated CHF. On office visit today, was increased on diuretics.  However with new findings on lab work safer to be admitted to hospital for management. Make sure he is not bleeding on coumadin and for more aggressive diureses. Patient called to inform.

## 2017-03-25 NOTE — H&P (Signed)
History and Physical    [name] IHW:[id number] DOB: 25-Dec-1955 DOA: 03/25/2017  PCP: Sinda Du, MD  Patient coming from:  Home.    Chief Complaint:   Sent in from cardiology clinic for decompensated CHF and anemia with Hb of 7.7 g per dL.  HPI: [name] is an 61 y.o. male with hx of colon cancer, prior colonoscopy by Dr Gala Romney, hx of anemia, COPD on home oxygen, Ischemic cardiomyopathy with EF 25%, afib on Coumadin, s/p ICD placement, continued tobacco abuse, seen in cardiology clinic, and had diuretic increased, but found to have anemia, and recommended inpatient treatment.  He has been smoking, and noted increased SOB for the past week.  He has no black or bloody stool, but OB positive in the ER.  He has been on ASA and Coumadin, but denied NSAIDS, and he has no abdominal pain, nausea, or vomtiing.  Evalution in the ER showed BUN 16, Cr 1.1, Hb 7.7 g per dL, and BNP1200. With CXR showed mild vascular congestion.  His INR was 1.9.  Hospitalist was asked to admit him for acute on chronic combined CHF, anemia with OB positve stool on coumadin, slow bleeding and hemodynamically stable.   ED Course:  See above.  Rewiew of Systems:  Constitutional: Negative for malaise, fever and chills. No significant weight loss or weight gain Eyes: Negative for eye pain, redness and discharge, diplopia, visual changes, or flashes of light. ENMT: Negative for ear pain, hoarseness, nasal congestion, sinus pressure and sore throat. No headaches; tinnitus, drooling, or problem swallowing. Cardiovascular: Negative for chest pain, palpitations, diaphoresis,and peripheral edema. ; No orthopnea, PND Respiratory: Negative for cough, hemoptysis, wheezing and stridor. No pleuritic chestpain. Gastrointestinal: Negative for diarrhea, constipation,  melena, blood in stool, hematemesis, jaundice and rectal bleeding.    Genitourinary: Negative for frequency, dysuria, incontinence,flank pain and  hematuria; Musculoskeletal: Negative for back pain and neck pain. Negative for swelling and trauma.;  Skin: . Negative for pruritus, rash, abrasions, bruising and skin lesion.; ulcerations Neuro: Negative for headache, lightheadedness and neck stiffness. Negative for weakness, altered level of consciousness , altered mental status, extremity weakness, burning feet, involuntary movement, seizure and syncope.  Psych: negative for anxiety, depression, insomnia, tearfulness, panic attacks, hallucinations, paranoia, suicidal or homicidal ideation    Past Medical History:  Diagnosis Date  . Anemia, normocytic normochromic 2008   2008  . Arteriosclerotic cardiovascular disease (ASCVD)    Acute MI in 2006->  LAD stent, EF-37%  . Arthritis    Hx: of in hands  . Automatic implantable cardioverter-defibrillator in situ   . Carpal tunnel syndrome   . Colon cancer ([place]) 2006   Partial colectomy in 2006  . Complete heart block ([place])    S/P ICD 02/09/13  . COPD (chronic obstructive pulmonary disease) ([place])   . Coronary artery disease   . Diabetes mellitus, type II ([place])    With peripheral neuropathy  . Edema    venous insufficiency  . H/O alcohol dependence ([place])   . H/O hiatal hernia   . Hyperlipidemia   . Hypertension   . Myocardial infarction [facility])    Hx: of 2006  . Neuropathy in diabetes ([place])    Hx: of  . Pacemaker   . Pneumonia 01/2012   01/2012; subsequent admission for chest pain  . Shortness of breath    Hx: of   . Tobacco abuse     Past Surgical History:  Procedure Laterality Date  . ANKLE SURGERY  Right  . Biventricular ICD Placement  02/09/2013   Dr. Lovena Mikell Camp  . COLONOSCOPY  08/17/2005   Pancolonic diverticula/Multiple rectal polyps removed as described above. The larger of the two likely responsible for intermittent hematochezia/ Multiple polyps at the hepatic flexure resected with a snare./ Large polypoid lesion growing out of the base of the cecum, just behind the   ileocecal valve. This was not felt to be amendable to endoscopic resection. Biopsied multiple times  . CORONARY ANGIOPLASTY WITH STENT PLACEMENT    . FLEXIBLE SIGMOIDOSCOPY    06/13/2006   Essentially normal residual rectum status post total colectomy. anastomosis at 25cm.  Focal area of abnormality adjacent to suture, as described above, likely not significant, suspect granulation tissue, biopsied.  Anastomosis otherwise appeared normal.  Distal terminal ileum appeared normal  . FLEXIBLE SIGMOIDOSCOPY N/A 01/13/2015   Procedure: FLEXIBLE SIGMOIDOSCOPY;  Surgeon: Daneil Dolin, MD;  [facility];  Service: Endoscopy;  Laterality: N/A;  735am  . INGUINAL HERNIA REPAIR    . MULTIPLE EXTRACTIONS WITH ALVEOLOPLASTY N/A 04/09/2013   Procedure: Extraction of tooth #'s 1,2,4,5,6,7,8,9,10,11,12,13,17,18,20,21,22,23,27,28, 29,30,31, and 32 with alveoloplasty.;  Surgeon: Lenn Cal, DDS;  Location: [place];  Service: Oral Surgery;  Laterality: N/A;  . PERMANENT PACEMAKER INSERTION N/A 02/09/2013   Procedure: PERMANENT PACEMAKER INSERTION;  Surgeon: Evans Lance, MD;  Location: [facility] CATH LAB;  Service: Cardiovascular;  Laterality: N/A;  . SUBTOTAL COLECTOMY  09/01/2005   subtotal colectomy  . TEMPORARY PACEMAKER INSERTION Right 02/09/2013   Procedure: TEMPORARY PACEMAKER INSERTION;  Surgeon: Sinclair Grooms, MD;  Location: [facility] CATH LAB;  Service: Cardiovascular;  Laterality: Right;     reports that he has been smoking Cigarettes.  He started smoking about 45 years ago. He has a 40.00 pack-year smoking history. He has never used smokeless tobacco. He reports that he does not drink alcohol or use drugs.  Allergies  Allergen Reactions  . Vancomycin Itching and Rash    Family History  Problem Relation Age of Onset  . Colon cancer Mother        Eryx Zane, ?>age 37.  [name] Heart disease Mother   . Heart disease Father   . Hypertension Brother   . Heart disease Brother   . Diabetes Brother    . Liver disease Neg Hx      Prior to Admission medications   Medication Sig Start Date End Date Taking? Authorizing Provider  acetaminophen (TYLENOL) 500 MG tablet Take 1 tablet by mouth as needed. 01/08/14  Yes [provider]  albuterol (PROVENTIL HFA;VENTOLIN HFA) 108 (90 BASE) MCG/ACT inhaler Inhale 2 puffs into the lungs every 6 (six) hours as needed. For shortness of breath   Yes [provider]  albuterol (PROVENTIL) (2.5 MG/3ML) 0.083% nebulizer solution Take 2.5 mg by nebulization every 6 (six) hours as needed. For shortness of breath   Yes [provider]  aspirin EC 81 MG tablet Take 1 tablet by mouth daily. 01/08/14  Yes [provider]  carvedilol (COREG) 6.25 MG tablet Take 6.25 mg by mouth 2 (two) times daily with a meal.  05/04/12  Yes [provider]  furosemide (LASIX) 40 MG tablet Take 1 tablet (40 mg total) by mouth 2 (two) times daily. Patient taking differently: Take 20-40 mg by mouth See admin instructions. 40mg  in the morning and 20mg  in the evening 03/25/17 06/23/17 Yes Lendon Colonel, NP  gabapentin (NEURONTIN) 300 MG capsule Take 300 mg by mouth at bedtime.  Yes [provider]  insulin glargine (LANTUS) 100 UNIT/ML injection Inject 20 Units into the skin 2 (two) times daily.    Yes [provider]  lisinopril (PRINIVIL,ZESTRIL) 10 MG tablet Take 1 tablet by mouth daily. 03/08/17  Yes [provider]  loratadine (CLARITIN) 10 MG tablet Take 10 mg by mouth daily as needed for allergies. As needed 01/21/15  Yes [provider]  magnesium oxide (MAG-OX) 400 MG tablet Take 400 mg by mouth 2 (two) times daily.   Yes [provider]  metFORMIN (GLUCOPHAGE) 500 MG tablet Take 1,000 mg by mouth 2 (two) times daily with a meal.   Yes [provider]  metolazone (ZAROXOLYN) 2.5 MG tablet Take 2.5 mg on Sunday and Wednesday ONLY Patient taking differently: Take 2.5 mg by mouth 2 (two)  times a week. Take 2.5 mg on Sunday and Wednesday ONLY 08/30/16  Yes Lendon Colonel, NP  Multiple Vitamin (MULTIVITAMIN) tablet Take 1 tablet by mouth daily.   Yes [provider]  nitroGLYCERIN (NITROSTAT) 0.4 MG SL tablet Place 0.4 mg under the tongue every 5 (five) minutes x 3 doses as needed. For chest pain   Yes [provider]  ondansetron (ZOFRAN) 4 MG tablet Take 4 mg by mouth 4 (four) times daily as needed for nausea or vomiting.   Yes [provider]  sertraline (ZOLOFT) 50 MG tablet Take 50 mg by mouth daily.   Yes [provider]  simvastatin (ZOCOR) 80 MG tablet Take 40 mg by mouth at bedtime.   Yes [provider]  tiZANidine (ZANAFLEX) 4 MG tablet Take 4 mg by mouth 3 (three) times daily.   Yes [provider]  warfarin (COUMADIN) 4 MG tablet Take 2 tablets daily except 3 tablets on Wednesdays Patient taking differently: Take 8-12 mg by mouth See admin instructions. Take 2 tablets daily except 3 tablets on Wednesdays 03/02/17  Yes Evans Lance, MD  potassium chloride SA (K-DUR,KLOR-CON) 20 MEQ tablet Take 1 tablet (20 mEq total) by mouth daily. 03/25/17   Lendon Colonel, NP  traMADol (ULTRAM) 50 MG tablet Take 50-100 mg by mouth every 4 (four) hours as needed.  11/08/14   [provider]    Physical Exam: Vitals:   03/25/17 1813  BP: 132/64  Pulse: 83  Resp: 20  Temp: 98 F (36.7 C)  TempSrc: Oral  SpO2: 93%  Weight: 86.6 kg (191 lb)  Height: 6' (1.829 m)      Constitutional: NAD, calm, comfortable Vitals:   03/25/17 1813  BP: 132/64  Pulse: 83  Resp: 20  Temp: 98 F (36.7 C)  TempSrc: Oral  SpO2: 93%  Weight: 86.6 kg (191 lb)  Height: 6' (1.829 m)   Eyes: PERRL, lids and conjunctivae normal ENMT: Mucous membranes are moist. Posterior pharynx clear of any exudate or lesions.Normal dentition.  Neck: normal, supple, no masses, no thyromegaly Respiratory: clear to auscultation bilaterally,  no wheezing, no crackles. Normal respiratory effort. No accessory muscle use.  Cardiovascular: Regular rate and rhythm, no murmurs / rubs / gallops. No extremity edema. 2+ pedal pulses. No carotid bruits.  Abdomen: no tenderness, no masses palpated. No hepatosplenomegaly. Bowel sounds positive.  Musculoskeletal: no clubbing / cyanosis. No joint deformity upper and lower extremities. Good ROM, no contractures. Normal muscle tone.  Skin: no rashes, lesions, ulcers. No induration Neurologic: CN 2-12 grossly intact. Sensation intact, DTR normal. Strength 5/5 in all 4.  Psychiatric: Normal judgment and insight. Alert and  oriented x 3. Normal mood.     Labs on Admission: I have personally reviewed following labs and imaging studies  CBC:  Recent Labs Lab 03/25/17 1534  WBC 5.2  HGB 7.7*  HCT 25.9*  MCV 79.4  PLT 409   Basic Metabolic Panel:  Recent Labs Lab 03/25/17 1534  NA 137  K 4.9  CL 101  CO2 28  GLUCOSE 165*  BUN 16  CREATININE 1.15  CALCIUM 8.9   Coagulation Profile:  Recent Labs Lab 03/25/17 1842  INR 1.95   Anemia Panel:  Recent Labs  03/25/17 1842  RETICCTPCT 3.0   Urine analysis:    Component Value Date/Time   COLORURINE YELLOW 01/26/2014 0909   APPEARANCEUR CLEAR 01/26/2014 0909   LABSPEC >1.030 (H) 01/26/2014 0909   PHURINE 5.5 01/26/2014 0909   GLUCOSEU 100 (A) 01/26/2014 0909   HGBUR TRACE (A) 01/26/2014 0909   BILIRUBINUR NEGATIVE 01/26/2014 0909   KETONESUR NEGATIVE 01/26/2014 0909   PROTEINUR 100 (A) 01/26/2014 0909   UROBILINOGEN 0.2 01/26/2014 0909   NITRITE NEGATIVE 01/26/2014 0909   LEUKOCYTESUR NEGATIVE 01/26/2014 0909    Radiological Exams on Admission: Dg Chest 2 View  Result Date: 03/25/2017 CLINICAL DATA:  61 year old male with shortness of breath. EXAM: CHEST  2 VIEW COMPARISON:  Chest radiograph dated 09/09/2016 FINDINGS: There is mild central vascular prominence and diffuse interstitial densities most consistent with mild  vascular congestion and interstitial edema. Bibasilar atelectatic changes/scarring. Infiltrate is not excluded. There is a trace right pleural effusion. No pneumothorax. Top-normal cardiac size. Coronary vascular calcification. Left pectoral AICD device. Degenerative changes of the spine. No acute osseous pathology. IMPRESSION: 1. Mild vascular congestion and interstitial edema, increased compared to prior radiograph. 2. Bibasilar atelectatic changes/scarring. Infiltrate is not excluded. Clinical correlation is recommended. 3. Probable trace right pleural effusion. 4. Coronary vascular disease. Electronically Signed   By: Anner Crete M.D.   On: 03/25/2017 20:44    EKG: Independently reviewed.   Assessment/Plan Principal Problem:   Acute on chronic combined systolic and diastolic CHF (congestive heart failure) (HCC) Active Problems:   COPD (chronic obstructive pulmonary disease) (HCC)   Diabetes mellitus, type II (HCC)   Hypertension   Hyperlipidemia   Tobacco abuse   Anemia, normocytic normochromic   Biventricular implantable cardioverter-defibrillator in situ   History of colon cancer    PLAN:  Acute on chronic combined CHF:   Will continue with IV Lasix at 80mg  BID.  Follow I and O and daily weight.  His SOB could be from his COPD also.  Anemia:  Transfusion may tip him over into pulmonary edema.  But given hx of "Ischmemic CMP"  Will transfuse him carefully with one unit of PRBC.  Premedicate with IV Lasix.  His bleeding is likely a slow bleed.  Consider GI consultation Monday.  Hold Coumadin as he takes it for afib.  Hold ASA and give IV PPI.    COPD:  Advised stop smoking.  Continue oxygen and neb.  On home oxygen.    DM:  Give clear liquid, carb modified.  Moderate SSI.  Hold metformin and home dose of insulin for now.  HTN:  Stable.  Continue with meds, including coreg and ACE I.    DVT prophylaxis: SCD.  Code Status: FULL CODE.  Family Communication: None.  Disposition  Plan: Home.  Consults called: None.  Admission status: Inpatient.    Senta Kantor MD FACP. [facility]  If 7PM-7AM, please contact night-coverage [email or web address] Password [facility]  03/25/2017,  9:30 PM

## 2017-03-25 NOTE — Progress Notes (Signed)
Cardiology Office Note   Date:  03/25/2017   ID:  [name], [name] 31-Mar-1956, MRN [id number]  PCP:  Sinda Du, MD  Cardiologist:  Lovena Le  Chief Complaint  Patient presents with  . Congestive Heart Failure      History of Present Illness: [name] is a 60 y.o. male who presents for ongoing assessment and management of ischemic cardiopathy, with history of complete heart block status post by IV ICD, atrial fib flutter, coronary artery disease with stent to LAD in 2006, with other history to include hyperlipidemia, hypertension, type 2 diabetes, tobacco abuse, and peripheral neuropathy. The patient was last seen in our office in March 2018 by Dr. Lovena Le.  At that time the patient was doing well and continued class II dyspnea. No changes in his medications were made. [place] by the ICD was interrogated and working normally except that his LV lead impedance was elevated. This was reprogrammed during that office visit. He was advised on smoking cessation.  He comes today with complaints of a 12 pound weight gain over the last 3 weeks. He admits to dietary indiscretion eating salted foods and adding salt to his diet. The patient states that he is having worsening dyspnea, PND, and lower extremity edema. He has been taking his medications as directed but they are no longer working for him concerning diuresis. He denies near syncope, rapid heart rhythm, or significant chest pain. He does feels pressure in his chest when he is supine.  Past Medical History:  Diagnosis Date  . Anemia, normocytic normochromic 2008   2008  . Arteriosclerotic cardiovascular disease (ASCVD)    Acute MI in 2006->  LAD stent, EF-37%  . Arthritis    Hx: of in hands  . Automatic implantable cardioverter-defibrillator in situ   . Carpal tunnel syndrome   . Colon cancer ([place]) 2006   Partial colectomy in 2006  . Complete heart block ([place])    S/P ICD 02/09/13  . COPD (chronic obstructive pulmonary  disease) ([place])   . Coronary artery disease   . Diabetes mellitus, type II ([place])    With peripheral neuropathy  . Edema    venous insufficiency  . H/O alcohol dependence ([place])   . H/O hiatal hernia   . Hyperlipidemia   . Hypertension   . Myocardial infarction [facility])    Hx: of 2006  . Neuropathy in diabetes ([place])    Hx: of  . Pacemaker   . Pneumonia 01/2012   01/2012; subsequent admission for chest pain  . Shortness of breath    Hx: of   . Tobacco abuse     Past Surgical History:  Procedure Laterality Date  . ANKLE SURGERY     Right  . Biventricular ICD Placement  02/09/2013   Dr. Lovena Le  . COLONOSCOPY  08/17/2005   Pancolonic diverticula/Multiple rectal polyps removed as described above. The larger of the two likely responsible for intermittent hematochezia/ Multiple polyps at the hepatic flexure resected with a snare./ Large polypoid lesion growing out of the base of the cecum, just behind the  ileocecal valve. This was not felt to be amendable to endoscopic resection. Biopsied multiple times  . CORONARY ANGIOPLASTY WITH STENT PLACEMENT    . FLEXIBLE SIGMOIDOSCOPY    06/13/2006   Essentially normal residual rectum status post total colectomy. anastomosis at 25cm.  Focal area of abnormality adjacent to suture, as described above, likely not significant, suspect granulation tissue, biopsied.  Anastomosis otherwise appeared normal.  Distal  terminal ileum appeared normal  . FLEXIBLE SIGMOIDOSCOPY N/A 01/13/2015   Procedure: FLEXIBLE SIGMOIDOSCOPY;  Surgeon: Daneil Dolin, MD;  [facility];  Service: Endoscopy;  Laterality: N/A;  735am  . INGUINAL HERNIA REPAIR    . MULTIPLE EXTRACTIONS WITH ALVEOLOPLASTY N/A 04/09/2013   Procedure: Extraction of tooth #'s 1,2,4,5,6,7,8,9,10,11,12,13,17,18,20,21,22,23,27,28, 29,30,31, and 32 with alveoloplasty.;  Surgeon: Lenn Cal, DDS;  Location: [place];  Service: Oral Surgery;  Laterality: N/A;  . PERMANENT PACEMAKER INSERTION N/A  02/09/2013   Procedure: PERMANENT PACEMAKER INSERTION;  Surgeon: Evans Lance, MD;  Location: [facility] CATH LAB;  Service: Cardiovascular;  Laterality: N/A;  . SUBTOTAL COLECTOMY  09/01/2005   subtotal colectomy  . TEMPORARY PACEMAKER INSERTION Right 02/09/2013   Procedure: TEMPORARY PACEMAKER INSERTION;  Surgeon: Sinclair Grooms, MD;  Location: [facility] CATH LAB;  Service: Cardiovascular;  Laterality: Right;     Current Outpatient Prescriptions  Medication Sig Dispense Refill  . acetaminophen (TYLENOL) 500 MG tablet Take 1 tablet by mouth as needed.    [name] albuterol (PROVENTIL HFA;VENTOLIN HFA) 108 (90 BASE) MCG/ACT inhaler Inhale 2 puffs into the lungs every 6 (six) hours as needed. For shortness of breath    . albuterol (PROVENTIL) (2.5 MG/3ML) 0.083% nebulizer solution Take 2.5 mg by nebulization every 6 (six) hours as needed. For shortness of breath    . amoxicillin-clavulanate (AUGMENTIN) 875-125 MG tablet Take 1 tablet by mouth 2 (two) times daily.    [name] aspirin EC 81 MG tablet Take 1 tablet by mouth daily.    . carvedilol (COREG) 6.25 MG tablet Take 6.25 mg by mouth 2 (two) times daily with a meal.     . gabapentin (NEURONTIN) 300 MG capsule Take 300 mg by mouth at bedtime.    . insulin glargine (LANTUS) 100 UNIT/ML injection Inject 20 Units into the skin 2 (two) times daily.     [name] lisinopril (PRINIVIL,ZESTRIL) 10 MG tablet Take 1 tablet by mouth daily.    [name] loratadine (CLARITIN) 10 MG tablet Take 10 mg by mouth daily. As needed    . magnesium oxide (MAG-OX) 400 MG tablet Take 400 mg by mouth 2 (two) times daily.    . metFORMIN (GLUCOPHAGE) 500 MG tablet Take 1,000 mg by mouth 2 (two) times daily with a meal.    . metolazone (ZAROXOLYN) 2.5 MG tablet Take 2.5 mg on Sunday and Wednesday ONLY 24 tablet 3  . Multiple Vitamin (MULTIVITAMIN) tablet Take 1 tablet by mouth daily.    . nitroGLYCERIN (NITROSTAT) 0.4 MG SL tablet Place 0.4 mg under the tongue every 5 (five) minutes x 3 doses as needed. For  chest pain    . ondansetron (ZOFRAN) 4 MG tablet Take 4 mg by mouth 4 (four) times daily as needed for nausea or vomiting.    . sertraline (ZOLOFT) 50 MG tablet Take 50 mg by mouth daily.    . simvastatin (ZOCOR) 80 MG tablet Take 40 mg by mouth at bedtime.    [name] tiZANidine (ZANAFLEX) 4 MG tablet Take 4 mg by mouth 3 (three) times daily.    . traMADol (ULTRAM) 50 MG tablet Take 50-100 mg by mouth every 4 (four) hours as needed.     . warfarin (COUMADIN) 4 MG tablet Take 2 tablets daily except 3 tablets on Wednesdays 163 tablet 3  . furosemide (LASIX) 40 MG tablet Take 1 tablet (40 mg total) by mouth 2 (two) times daily. 180 tablet 3  . potassium chloride SA (K-DUR,KLOR-CON)  20 MEQ tablet Take 1 tablet (20 mEq total) by mouth daily. 90 tablet 3   No current facility-administered medications for this visit.     Allergies:   Vancomycin    Social History:  The patient  reports that he has been smoking Cigarettes.  He started smoking about 45 years ago. He has a 40.00 pack-year smoking history. He has never used smokeless tobacco. He reports that he does not drink alcohol or use drugs.   Family History:  The patient's family history includes Colon cancer in his mother; Diabetes in his brother; Heart disease in his brother, father, and mother; Hypertension in his brother.    ROS: All other systems are reviewed and negative. Unless otherwise mentioned in H&P    PHYSICAL EXAM: VS:  BP (!) 154/80   Pulse 70   Ht 6' (1.829 m)   Wt 191 lb (86.6 kg)   BMI 25.90 kg/m  , BMI Body mass index is 25.9 kg/m. GEN: Well nourished, well developed, in no acute distress  HEENT: normal  Neck: no JVD, carotid bruits, or masses Cardiac: Distant heart sounds, IRRR; 1/6 systolic murmur no rubs, or gallops, 2+ pitting pre-tibial edema.  Respiratory:  clear to auscultation bilaterally, normal work of breathing GI: soft, nontender, distended, + BS MS: no deformity or atrophy  Skin: warm and dry, no  rash Neuro:  Strength and sensation are intact Psych: euthymic mood, full affect   Recent Labs: 11/24/2016: BUN 12; Creatinine, Ser 1.02; Potassium 4.5; Sodium 137    Lipid Panel    Component Value Date/Time   CHOL 108 01/27/2014 0623   TRIG 100 01/27/2014 0623   HDL 46 01/27/2014 0623   CHOLHDL 2.3 01/27/2014 0623   VLDL 20 01/27/2014 0623   LDLCALC 42 01/27/2014 0623      Wt Readings from Last 3 Encounters:  03/25/17 191 lb (86.6 kg)  12/09/16 184 lb (83.5 kg)  09/17/16 181 lb (82.1 kg)      Other studies Reviewed: Echo 01/27/2014 Left ventricle: The cavity size was moderately dilated. Wall thickness was normal. Systolic function was severely reduced. The estimated ejection fraction was in the range of 25% to 30%. Diffuse hypokinesis. There is akinesis of the mid-distalanteroseptal and apical myocardium. Features are consistent with a pseudonormal left ventricular filling pattern, with concomitant abnormal relaxation and increased filling pressure (grade 2 diastolic dysfunction). - Aortic valve: Trivial regurgitation. Valve area: 2.02cm^2 (Vmax). - Mitral valve: Mild regurgitation. - Left atrium: The atrium was severely dilated. Impressions:  - No cardiac source of emboli was indentified. Compared to the prior study, there has been no significant interval change.  ASSESSMENT AND PLAN:  1.  Decompensated systolic heart failure: This is related to dietary indiscretion. He states he's been taking his medications daily without having skipped any doses. He states the diuretic has not been working as well as he has been gaining weight. He does have complaints of PND orthopnea and dyspnea on exertion.  I will increase his Lasix to 40 mg twice a day, increase metolazone to 2.5 mg daily. The patient will have potassium 40 mEq added to his medication regimen. A BMET, B and P, and CBC will be ordered. We'll see him back in 3 days for evaluation of  response to medications. If the patient is not finding results from increased dose of metolazone and Lasix, I advised him to come to the hospital so that he can be given IV diuretics, possibly due to Mal absorption of by  mouth medications in the setting of decompensated heart failure.  May need to change him to torsemide from Lasix on follow-up. The patient is to continue to weigh himself daily. Avoid salt. He is aware that if he is not found to have any significant diuresis we will likely admit him to the hospital on follow-up.  2. Ischemic cardiomyopathy: Most recent echocardiogram revealing an EF of 25% to 30%. The patient will need to have optimal medical management. May change lisinopril to [facility]. We'll check kidney function. He will continue carvedilol 6.25 mg twice a day. May need to increase this dose if blood pressure is not well controlled after diuresis. May repeat echo once he is fully diuresed  3. Hypertension: Very elevated for most recent documented ejection fraction. Hopefully increased dose of diuretics will be helpful to bring blood pressure down and also to cause diuresis. May need to adjust antihypertensive medications on follow-up. We'll see him again in 3 days.  4. Hypercholesterolemia: Continue statin therapy as directed.  5. Atrial fibrillation: Continue anticoagulation with Coumadin. He is due to follow-up in our Coumadin clinic in 2 weeks.  6. ICD in situ: Has recently received a letter for remote transmission scheduling for interrogation. He will follow protocol with Dr. Lovena Le concerning prescheduled interrogations.  7. Coronary artery disease: Stent to the LAD in 2006. He can pin use on aspirin therapy beta blocker, and statin. ACE inhibitor remains on board, but may change to [facility] after follow-up labs.  8. Ongoing tobacco abuse: Smoking cessation is essential, patient does not desire to completely quit, but has been cutting down. Especially now since he is having  trouble with breathing status and decompensated heart failure.   Current medicines are reviewed at length with the patient today.    Labs/ tests ordered today include:  Phill Myron. [place] Pugh, ANP, AACC   03/25/2017 3:40 PM    [facility] [address] [place], [place] [address] Phone: [phone]; Fax: [phone]

## 2017-03-25 NOTE — Patient Instructions (Signed)
Your physician recommends that you schedule a follow-up appointment in: next Monday or Tuesday with Dr Purcell Nails    INCREASE Lasix to 40 mg twice a day   START Potassium 20 meq daily   Take Metolazone 2.5 mg every day through this Sunday     Get lab work NOW: CBC,BMET,BNP    IF you get worse over the weekend [facility]       Thank you for choosing [facility] !

## 2017-03-25 NOTE — ED Provider Notes (Signed)
[facility] DEPT Provider Note   CSN: [id number] Arrival date & time: 03/25/17  1809     History   Chief Complaint Chief Complaint  Patient presents with  . Abnormal Lab    HPI [name] is a 61 y.o. male.  Patient with hx chf, c/o increased sob in the past week. Symptoms gradual onset, progressively worse. +orthopnea and increased leg swelling. States compliant w home meds. Urinating normal amount. +increased salt intake in past couple weeks. +smoker. Denies chest pain or discomfort.  Was at cardiology office today, had labs that included bnp elevated (1200) and Hgb much lower than baseline (7.9) - so patient advised to come to hospital for admission.  Pt denies blood loss or melena.    The history is provided by the patient.  Abnormal Lab    Past Medical History:  Diagnosis Date  . Anemia, normocytic normochromic 2008   2008  . Arteriosclerotic cardiovascular disease (ASCVD)    Acute MI in 2006->  LAD stent, EF-37%  . Arthritis    Hx: of in hands  . Automatic implantable cardioverter-defibrillator in situ   . Carpal tunnel syndrome   . Colon cancer ([place]) 2006   Partial colectomy in 2006  . Complete heart block ([place])    S/P ICD 02/09/13  . COPD (chronic obstructive pulmonary disease) ([place])   . Coronary artery disease   . Diabetes mellitus, type II ([place])    With peripheral neuropathy  . Edema    venous insufficiency  . H/O alcohol dependence ([place])   . H/O hiatal hernia   . Hyperlipidemia   . Hypertension   . Myocardial infarction [facility])    Hx: of 2006  . Neuropathy in diabetes ([place])    Hx: of  . Pacemaker   . Pneumonia 01/2012   01/2012; subsequent admission for chest pain  . Shortness of breath    Hx: of   . Tobacco abuse     Patient Active Problem List   Diagnosis Date Noted  . History of colon cancer 12/25/2014  . Abdominal pain 12/25/2014  . Encounter for therapeutic drug monitoring 02/11/2014  . Atrial fibrillation ([place]) 02/04/2014  .  Dizziness 01/26/2014  . Chronic systolic CHF (congestive heart failure) ([place]) 01/26/2014  . Ataxia 01/26/2014  . Biventricular implantable cardioverter-defibrillator in situ 03/16/2013  . COPD (chronic obstructive pulmonary disease) ([place])   . Arteriosclerotic cardiovascular disease (ASCVD)   . Diabetes mellitus, type II ([place])   . Colon cancer ([place])   . Hypertension   . Hyperlipidemia   . Tobacco abuse   . Anemia, normocytic normochromic   . Chronic diarrhea 07/10/2012    Past Surgical History:  Procedure Laterality Date  . ANKLE SURGERY     Right  . Biventricular ICD Placement  02/09/2013   Dr. Lovena Le  . COLONOSCOPY  08/17/2005   Pancolonic diverticula/Multiple rectal polyps removed as described above. The larger of the two likely responsible for intermittent hematochezia/ Multiple polyps at the hepatic flexure resected with a snare./ Large polypoid lesion growing out of the base of the cecum, just behind the  ileocecal valve. This was not felt to be amendable to endoscopic resection. Biopsied multiple times  . CORONARY ANGIOPLASTY WITH STENT PLACEMENT    . FLEXIBLE SIGMOIDOSCOPY    06/13/2006   Essentially normal residual rectum status post total colectomy. anastomosis at 25cm.  Focal area of abnormality adjacent to suture, as described above, likely not significant, suspect granulation tissue, biopsied.  Anastomosis otherwise appeared normal.  Distal terminal ileum appeared normal  . FLEXIBLE SIGMOIDOSCOPY N/A 01/13/2015   Procedure: FLEXIBLE SIGMOIDOSCOPY;  Surgeon: Daneil Dolin, MD;  [facility];  Service: Endoscopy;  Laterality: N/A;  735am  . INGUINAL HERNIA REPAIR    . MULTIPLE EXTRACTIONS WITH ALVEOLOPLASTY N/A 04/09/2013   Procedure: Extraction of tooth #'s 1,2,4,5,6,7,8,9,10,11,12,13,17,18,20,21,22,23,27,28, 29,30,31, and 32 with alveoloplasty.;  Surgeon: Lenn Cal, DDS;  Location: [facility];  Service: Oral Surgery;  Laterality: N/A;  . PERMANENT PACEMAKER  INSERTION N/A 02/09/2013   Procedure: PERMANENT PACEMAKER INSERTION;  Surgeon: Evans Lance, MD;  Location: [facility] CATH LAB;  Service: Cardiovascular;  Laterality: N/A;  . SUBTOTAL COLECTOMY  09/01/2005   subtotal colectomy  . TEMPORARY PACEMAKER INSERTION Right 02/09/2013   Procedure: TEMPORARY PACEMAKER INSERTION;  Surgeon: Sinclair Grooms, MD;  Location: [facility] CATH LAB;  Service: Cardiovascular;  Laterality: Right;       Home Medications    Prior to Admission medications   Medication Sig Start Date End Date Taking? Authorizing Provider  acetaminophen (TYLENOL) 500 MG tablet Take 1 tablet by mouth as needed. 01/08/14   [provider]  albuterol (PROVENTIL HFA;VENTOLIN HFA) 108 (90 BASE) MCG/ACT inhaler Inhale 2 puffs into the lungs every 6 (six) hours as needed. For shortness of breath    [provider]  albuterol (PROVENTIL) (2.5 MG/3ML) 0.083% nebulizer solution Take 2.5 mg by nebulization every 6 (six) hours as needed. For shortness of breath    [provider]  amoxicillin-clavulanate (AUGMENTIN) 875-125 MG tablet Take 1 tablet by mouth 2 (two) times daily. 03/10/17   [provider]  aspirin EC 81 MG tablet Take 1 tablet by mouth daily. 01/08/14   [provider]  carvedilol (COREG) 6.25 MG tablet Take 6.25 mg by mouth 2 (two) times daily with a meal.  05/04/12   [provider]  furosemide (LASIX) 40 MG tablet Take 1 tablet (40 mg total) by mouth 2 (two) times daily. 03/25/17 06/23/17  Lendon Colonel, NP  gabapentin (NEURONTIN) 300 MG capsule Take 300 mg by mouth at bedtime.    [provider]  insulin glargine (LANTUS) 100 UNIT/ML injection Inject 20 Units into the skin 2 (two) times daily.     [provider]  lisinopril (PRINIVIL,ZESTRIL) 10 MG tablet Take 1 tablet by mouth daily. 03/08/17   [provider]  loratadine (CLARITIN) 10 MG tablet Take 10 mg by mouth daily. As needed 01/21/15   [provider]   magnesium oxide (MAG-OX) 400 MG tablet Take 400 mg by mouth 2 (two) times daily.    [provider]  metFORMIN (GLUCOPHAGE) 500 MG tablet Take 1,000 mg by mouth 2 (two) times daily with a meal.    [provider]  metolazone (ZAROXOLYN) 2.5 MG tablet Take 2.5 mg on Sunday and Wednesday ONLY 08/30/16   Lendon Colonel, NP  Multiple Vitamin (MULTIVITAMIN) tablet Take 1 tablet by mouth daily.    [provider]  nitroGLYCERIN (NITROSTAT) 0.4 MG SL tablet Place 0.4 mg under the tongue every 5 (five) minutes x 3 doses as needed. For chest pain    [provider]  ondansetron (ZOFRAN) 4 MG tablet Take 4 mg by mouth 4 (four) times daily as needed for nausea or vomiting.    [provider]  potassium chloride SA (K-DUR,KLOR-CON) 20 MEQ tablet Take 1 tablet (20 mEq total) by mouth daily. 03/25/17   Lendon Colonel, NP  sertraline (ZOLOFT) 50 MG  tablet Take 50 mg by mouth daily.    [provider]  simvastatin (ZOCOR) 80 MG tablet Take 40 mg by mouth at bedtime.    [provider]  tiZANidine (ZANAFLEX) 4 MG tablet Take 4 mg by mouth 3 (three) times daily.    [provider]  traMADol (ULTRAM) 50 MG tablet Take 50-100 mg by mouth every 4 (four) hours as needed.  11/08/14   [provider]  warfarin (COUMADIN) 4 MG tablet Take 2 tablets daily except 3 tablets on Wednesdays 03/02/17   Evans Lance, MD    Family History Family History  Problem Relation Age of Onset  . Colon cancer Mother        Greogory Cornette, ?>age 65.  [name] Heart disease Mother   . Heart disease Father   . Hypertension Brother   . Heart disease Brother   . Diabetes Brother   . Liver disease Neg Hx     Social History Social History  Substance Use Topics  . Smoking status: Current Every Day Smoker    Packs/day: 1.00    Years: 40.00    Types: Cigarettes    Start date: 07/13/1971  . Smokeless tobacco: Never Used  . Alcohol use No     Comment:  Quit 2005 from a 6 pack a day use     Allergies   Vancomycin   Review of Systems Review of Systems  Constitutional: Negative for fever.  HENT: Negative for sore throat.   Eyes: Negative for redness.  Respiratory: Positive for shortness of breath.   Cardiovascular: Positive for leg swelling. Negative for chest pain.  Gastrointestinal: Negative for abdominal pain, blood in stool, diarrhea and vomiting.  Genitourinary: Negative for flank pain.  Musculoskeletal: Negative for back pain.  Skin: Negative for rash.  Neurological: Negative for headaches.  Hematological: Does not bruise/bleed easily.  Psychiatric/Behavioral: Negative for confusion.     Physical Exam Updated Vital Signs BP 132/64 (BP Location: Right Arm)   Pulse 83   Temp 98 F (36.7 C) (Oral)   Resp 20   Ht 1.829 m (6')   Wt 86.6 kg (191 lb)   SpO2 93%   BMI 25.90 kg/m   Physical Exam  Constitutional: He appears well-developed and well-nourished. No distress.  HENT:  Head: Atraumatic.  Mouth/Throat: Oropharynx is clear and moist.  Eyes: Conjunctivae are normal.  Neck: Neck supple. No tracheal deviation present.  Cardiovascular: Normal rate, regular rhythm, normal heart sounds and intact distal pulses.   Pulmonary/Chest: Effort normal. No accessory muscle usage. No respiratory distress.  Rales bases.   Abdominal: Soft. He exhibits no distension. There is no tenderness.  Genitourinary:  Genitourinary Comments: Light brown/yellow stool. Does test heme pos.   Musculoskeletal: He exhibits edema.  Neurological: He is alert.  Skin: Skin is warm and dry. He is not diaphoretic.  Psychiatric: He has a normal mood and affect.  Nursing note and vitals reviewed.    ED Treatments / Results  Labs (all labs ordered are listed, but only abnormal results are displayed) Results for orders placed or performed during the hospital encounter of 03/25/17  Protime-INR  Result Value Ref Range   Prothrombin Time 22.5 (H)  11.4 - 15.2 seconds   INR 1.95   Reticulocytes  Result Value Ref Range   Retic Ct Pct 3.0 0.4 - 3.1 %   RBC. 3.30 (L) 4.22 - 5.81 MIL/uL   Retic Count, Absolute 99.0 19.0 - 186.0 K/uL  POC occult blood,  ED Provider will collect  Result Value Ref Range   Fecal Occult Bld POSITIVE (A) NEGATIVE  I-stat troponin, ED  Result Value Ref Range   Troponin i, poc 0.01 0.00 - 0.08 ng/mL   Comment 3            EKG  EKG Interpretation  Date/Time:  Friday March 25 2017 20:46:18 EDT Ventricular Rate:  75 PR Interval:    QRS Duration: 116 QT Interval:  413 QTC Calculation: 462 R Axis:   -63 Text Interpretation:  A-V dual-paced rhythm with some inhibition No further analysis attempted due to paced rhythm Confirmed by Lajean Saver [id number]) on 03/25/2017 9:04:48 PM       Radiology Dg Chest 2 View  Result Date: 03/25/2017 CLINICAL DATA:  61 year old male with shortness of breath. EXAM: CHEST  2 VIEW COMPARISON:  Chest radiograph dated 09/09/2016 FINDINGS: There is mild central vascular prominence and diffuse interstitial densities most consistent with mild vascular congestion and interstitial edema. Bibasilar atelectatic changes/scarring. Infiltrate is not excluded. There is a trace right pleural effusion. No pneumothorax. Top-normal cardiac size. Coronary vascular calcification. Left pectoral AICD device. Degenerative changes of the spine. No acute osseous pathology. IMPRESSION: 1. Mild vascular congestion and interstitial edema, increased compared to prior radiograph. 2. Bibasilar atelectatic changes/scarring. Infiltrate is not excluded. Clinical correlation is recommended. 3. Probable trace right pleural effusion. 4. Coronary vascular disease. Electronically Signed   By: Anner Crete M.D.   On: 03/25/2017 20:44    Procedures Procedures (including critical care time)  Medications Ordered in ED Medications - No data to display   Initial Impression / Assessment and Plan / ED Course  I have  reviewed the triage vital signs and the nursing notes.  Pertinent labs & imaging results that were available during my care of the patient were reviewed by me and considered in my medical decision making (see chart for details).  Additional lab tests added. Cxr.   Reviewed nursing notes and prior charts for additional history.   Lasix 80 mg iv.   Hospitalists consulted for admission.   Final Clinical Impressions(s) / ED Diagnoses   Final diagnoses:  None    New Prescriptions New Prescriptions   No medications on file     Lajean Saver, MD 03/25/17 2105

## 2017-03-25 NOTE — ED Notes (Signed)
Pt 02 dropped to 83% on 2L, sit patient up in bed, increased to 4L [employer], sat now 91%

## 2017-03-25 NOTE — ED Triage Notes (Signed)
Pt reports that he has been sent here from heart care for admission States he was called at 1730 and told to come to the hospital for admission  Dr Purcell Nails is card

## 2017-03-25 NOTE — ED Triage Notes (Signed)
Sent for HGB 7.7 and BNP >1200.

## 2017-03-26 ENCOUNTER — Inpatient Hospital Stay (HOSPITAL_COMMUNITY): Payer: Medicare HMO

## 2017-03-26 DIAGNOSIS — I350 Nonrheumatic aortic (valve) stenosis: Secondary | ICD-10-CM

## 2017-03-26 LAB — CBC
HCT: 28.3 % — ABNORMAL LOW (ref 39.0–52.0)
HEMATOCRIT: 26.1 % — AB (ref 39.0–52.0)
HEMATOCRIT: 34.1 % — AB (ref 39.0–52.0)
HEMOGLOBIN: 8.4 g/dL — AB (ref 13.0–17.0)
HEMOGLOBIN: 7.7 g/dL — AB (ref 13.0–17.0)
HEMOGLOBIN: 10.9 g/dL — AB (ref 13.0–17.0)
MCH: 23.6 pg — AB (ref 26.0–34.0)
MCH: 23.1 pg — ABNORMAL LOW (ref 26.0–34.0)
MCH: 25.2 pg — AB (ref 26.0–34.0)
MCHC: 29.7 g/dL — ABNORMAL LOW (ref 30.0–36.0)
MCHC: 29.5 g/dL — ABNORMAL LOW (ref 30.0–36.0)
MCHC: 32 g/dL (ref 30.0–36.0)
MCV: 79.5 fL (ref 78.0–100.0)
MCV: 78.1 fL (ref 78.0–100.0)
MCV: 78.8 fL (ref 78.0–100.0)
PLATELETS: 211 10*3/uL (ref 150–400)
Platelets: 189 10*3/uL (ref 150–400)
Platelets: 213 10*3/uL (ref 150–400)
RBC: 3.56 MIL/uL — AB (ref 4.22–5.81)
RBC: 3.34 MIL/uL — ABNORMAL LOW (ref 4.22–5.81)
RBC: 4.33 MIL/uL (ref 4.22–5.81)
RDW: 18.4 % — ABNORMAL HIGH (ref 11.5–15.5)
RDW: 18.5 % — AB (ref 11.5–15.5)
RDW: 17.3 % — ABNORMAL HIGH (ref 11.5–15.5)
WBC: 7.2 10*3/uL (ref 4.0–10.5)
WBC: 5.3 10*3/uL (ref 4.0–10.5)
WBC: 5 10*3/uL (ref 4.0–10.5)

## 2017-03-26 LAB — BASIC METABOLIC PANEL
ANION GAP: 8 (ref 5–15)
BUN: 16 mg/dL (ref 6–20)
CHLORIDE: 100 mmol/L — AB (ref 101–111)
CO2: 32 mmol/L (ref 22–32)
Calcium: 9.1 mg/dL (ref 8.9–10.3)
Creatinine, Ser: 1.21 mg/dL (ref 0.61–1.24)
GFR calc Af Amer: >60 mL/min (ref >60)
GFR calc non Af Amer: >60 mL/min (ref >60)
Glucose, Bld: 105 mg/dL — ABNORMAL HIGH (ref 65–99)
POTASSIUM: 3.7 mmol/L (ref 3.5–5.1)
SODIUM: 140 mmol/L (ref 135–145)

## 2017-03-26 LAB — GLUCOSE, CAPILLARY
GLUCOSE-CAPILLARY: 238 mg/dL — AB (ref 65–99)
GLUCOSE-CAPILLARY: 241 mg/dL — AB (ref 65–99)
Glucose-Capillary: 100 mg/dL — ABNORMAL HIGH (ref 65–99)
Glucose-Capillary: 210 mg/dL — ABNORMAL HIGH (ref 65–99)
Glucose-Capillary: 276 mg/dL — ABNORMAL HIGH (ref 65–99)

## 2017-03-26 LAB — VITAMIN B12: Vitamin B-12: 260 pg/mL (ref 180–914)

## 2017-03-26 LAB — ABO/RH: ABO/RH(D): O POS

## 2017-03-26 LAB — ECHOCARDIOGRAM COMPLETE
Height: 72 in
WEIGHTICAEL: 2897.6 [oz_av]

## 2017-03-26 LAB — IRON AND TIBC
Iron: 31 ug/dL — ABNORMAL LOW (ref 45–182)
Saturation Ratios: 7 % — ABNORMAL LOW (ref 17.9–39.5)
TIBC: 468 ug/dL — ABNORMAL HIGH (ref 250–450)
UIBC: 437 ug/dL

## 2017-03-26 LAB — FOLATE: Folate: 44 ng/mL (ref >5.9)

## 2017-03-26 LAB — PREPARE RBC (CROSSMATCH)

## 2017-03-26 LAB — FERRITIN: FERRITIN: 18 ng/mL — AB (ref 24–336)

## 2017-03-26 MED ORDER — FUROSEMIDE 10 MG/ML IJ SOLN
20.0000 mg | Freq: Once | INTRAMUSCULAR | Status: AC
  Administered 2017-03-26: 20 mg via INTRAVENOUS
  Filled 2017-03-26: qty 2

## 2017-03-26 MED ORDER — SODIUM CHLORIDE 0.9 % IV SOLN
Freq: Once | INTRAVENOUS | Status: AC
  Administered 2017-03-26: 10:00:00 via INTRAVENOUS

## 2017-03-26 MED ORDER — ACETAMINOPHEN 325 MG PO TABS
650.0000 mg | ORAL_TABLET | Freq: Once | ORAL | Status: AC
  Administered 2017-03-26: 650 mg via ORAL
  Filled 2017-03-26: qty 2

## 2017-03-26 MED ORDER — POTASSIUM CHLORIDE CRYS ER 20 MEQ PO TBCR
20.0000 meq | EXTENDED_RELEASE_TABLET | Freq: Three times a day (TID) | ORAL | Status: DC
  Administered 2017-03-26 (×3): 20 meq via ORAL
  Filled 2017-03-26 (×3): qty 1

## 2017-03-26 MED ORDER — DIPHENHYDRAMINE HCL 25 MG PO CAPS
25.0000 mg | ORAL_CAPSULE | Freq: Once | ORAL | Status: AC
Start: 2017-03-26 — End: 2017-03-26
  Administered 2017-03-26: 25 mg via ORAL
  Filled 2017-03-26: qty 1

## 2017-03-26 NOTE — Progress Notes (Signed)
Subjective: He says he feels much better than yesterday. He was admitted with acute on chronic congestive heart failure. Echocardiogram is pending. He had some blood in his stool. He has been chronically anticoagulated but her anticoagulation is being held because of the bleeding. His hemoglobin level has dropped down to 7.7 and with his cardiac situation I think he is going to need a blood transfusion. He has not had any chest pain. He doesn't have any other new complaints. He felt like and left swelling in his abdomen. He's not having any abdominal pain.  Objective: Vital signs in last 24 hours: Temp:  [98 F (36.7 C)-99.5 F (37.5 C)] 99.5 F (37.5 C) (06/23 0456) Pulse Rate:  [66-102] 80 (06/23 0456) Resp:  [18-30] 18 (06/23 0456) BP: (132-167)/(64-80) 137/65 (06/23 0456) SpO2:  [89 %-96 %] 96 % (06/23 0822) FiO2 (%):  [0 %] 0 % (06/22 2131) Weight:  [82.1 kg (181 lb 1.6 oz)-86.6 kg (191 lb)] 82.1 kg (181 lb 1.6 oz) (06/23 0456) Weight change:     Intake/Output from previous day: 06/22 0701 - 06/23 0700 In: -  Out: 2500 [Urine:2500]  PHYSICAL EXAM General appearance: alert, cooperative and no distress Resp: He still has some rales in the bases bilaterally Cardio: irregularly irregular rhythm GI: His abdomen is more distended than usual Extremities: He has bilateral edema in his legs about 2+ Skin warm and dry. Mucous membranes are moist  Lab Results:  Results for orders placed or performed during the hospital encounter of 03/25/17 (from the past 48 hour(s))  Type and screen     Status: None   Collection Time: 03/25/17  6:42 PM  Result Value Ref Range   ABO/RH(D) O POS    Antibody Screen NEG    Sample Expiration 03/28/2017   Protime-INR     Status: Abnormal   Collection Time: 03/25/17  6:42 PM  Result Value Ref Range   Prothrombin Time 22.5 (H) 11.4 - 15.2 seconds   INR 1.95   Reticulocytes     Status: Abnormal   Collection Time: 03/25/17  6:42 PM  Result Value Ref  Range   Retic Ct Pct 3.0 0.4 - 3.1 %   RBC. 3.30 (L) 4.22 - 5.81 MIL/uL   Retic Count, Absolute 99.0 19.0 - 186.0 K/uL  I-stat troponin, ED     Status: None   Collection Time: 03/25/17  7:41 PM  Result Value Ref Range   Troponin i, poc 0.01 0.00 - 0.08 ng/mL   Comment 3            Comment: Due to the release kinetics of cTnI, a negative result within the first hours of the onset of symptoms does not rule out myocardial infarction with certainty. If myocardial infarction is still suspected, repeat the test at appropriate intervals.   POC occult blood, ED Provider will collect     Status: Abnormal   Collection Time: 03/25/17  7:51 PM  Result Value Ref Range   Fecal Occult Bld POSITIVE (A) NEGATIVE  CBC     Status: Abnormal   Collection Time: 03/25/17 11:21 PM  Result Value Ref Range   WBC 7.2 4.0 - 10.5 K/uL   RBC 3.56 (L) 4.22 - 5.81 MIL/uL   Hemoglobin 8.4 (L) 13.0 - 17.0 g/dL   HCT 28.3 (L) 39.0 - 52.0 %   MCV 79.5 78.0 - 100.0 fL   MCH 23.6 (L) 26.0 - 34.0 pg   MCHC 29.7 (L) 30.0 - 36.0 g/dL  RDW 18.4 (H) 11.5 - 15.5 %   Platelets 211 150 - 400 K/uL  Glucose, capillary     Status: Abnormal   Collection Time: 03/26/17 12:47 AM  Result Value Ref Range   Glucose-Capillary 276 (H) 65 - 99 mg/dL  Basic metabolic panel     Status: Abnormal   Collection Time: 03/26/17  6:30 AM  Result Value Ref Range   Sodium 140 135 - 145 mmol/L   Potassium 3.7 3.5 - 5.1 mmol/L    Comment: DELTA CHECK NOTED   Chloride 100 (L) 101 - 111 mmol/L   CO2 32 22 - 32 mmol/L   Glucose, Bld 105 (H) 65 - 99 mg/dL   BUN 16 6 - 20 mg/dL   Creatinine, Ser 1.21 0.61 - 1.24 mg/dL   Calcium 9.1 8.9 - 10.3 mg/dL   GFR calc non Af Amer >60 >60 mL/min   GFR calc Af Amer >60 >60 mL/min    Comment: (NOTE) The eGFR has been calculated using the CKD EPI equation. This calculation has not been validated in all clinical situations. eGFR's persistently <60 mL/min signify possible Chronic Kidney Disease.     Anion gap 8 5 - 15  CBC     Status: Abnormal   Collection Time: 03/26/17  6:30 AM  Result Value Ref Range   WBC 5.3 4.0 - 10.5 K/uL   RBC 3.34 (L) 4.22 - 5.81 MIL/uL   Hemoglobin 7.7 (L) 13.0 - 17.0 g/dL   HCT 26.1 (L) 39.0 - 52.0 %   MCV 78.1 78.0 - 100.0 fL   MCH 23.1 (L) 26.0 - 34.0 pg   MCHC 29.5 (L) 30.0 - 36.0 g/dL   RDW 18.5 (H) 11.5 - 15.5 %   Platelets 189 150 - 400 K/uL    Comment: SPECIMEN CHECKED FOR CLOTS PLATELET COUNT CONFIRMED BY SMEAR   Glucose, capillary     Status: Abnormal   Collection Time: 03/26/17  7:54 AM  Result Value Ref Range   Glucose-Capillary 100 (H) 65 - 99 mg/dL    ABGS No results for input(s): PHART, PO2ART, TCO2, HCO3 in the last 72 hours.  Invalid input(s): PCO2 CULTURES No results found for this or any previous visit (from the past 240 hour(s)). Studies/Results: Dg Chest 2 View  Result Date: 03/25/2017 CLINICAL DATA:  61 year old male with shortness of breath. EXAM: CHEST  2 VIEW COMPARISON:  Chest radiograph dated 09/09/2016 FINDINGS: There is mild central vascular prominence and diffuse interstitial densities most consistent with mild vascular congestion and interstitial edema. Bibasilar atelectatic changes/scarring. Infiltrate is not excluded. There is a trace right pleural effusion. No pneumothorax. Top-normal cardiac size. Coronary vascular calcification. Left pectoral AICD device. Degenerative changes of the spine. No acute osseous pathology. IMPRESSION: 1. Mild vascular congestion and interstitial edema, increased compared to prior radiograph. 2. Bibasilar atelectatic changes/scarring. Infiltrate is not excluded. Clinical correlation is recommended. 3. Probable trace right pleural effusion. 4. Coronary vascular disease. Electronically Signed   By: Anner Crete M.D.   On: 03/25/2017 20:44    Medications:  Prior to Admission:  Prescriptions Prior to Admission  Medication Sig Dispense Refill Last Dose  . acetaminophen (TYLENOL) 500 MG  tablet Take 1 tablet by mouth as needed.   unknown  . albuterol (PROVENTIL HFA;VENTOLIN HFA) 108 (90 BASE) MCG/ACT inhaler Inhale 2 puffs into the lungs every 6 (six) hours as needed. For shortness of breath   unknown  . albuterol (PROVENTIL) (2.5 MG/3ML) 0.083% nebulizer solution Take 2.5 mg by  nebulization every 6 (six) hours as needed. For shortness of breath   Past Week at Unknown time  . aspirin EC 81 MG tablet Take 1 tablet by mouth daily.   03/25/2017 at Unknown time  . carvedilol (COREG) 6.25 MG tablet Take 6.25 mg by mouth 2 (two) times daily with a meal.    03/25/2017 at 1330  . furosemide (LASIX) 40 MG tablet Take 1 tablet (40 mg total) by mouth 2 (two) times daily. (Patient taking differently: Take 20-40 mg by mouth See admin instructions. 78m in the morning and 2101min the evening) 180 tablet 3 03/25/2017 at Unknown time  . gabapentin (NEURONTIN) 300 MG capsule Take 300 mg by mouth at bedtime.   03/24/2017 at Unknown time  . insulin glargine (LANTUS) 100 UNIT/ML injection Inject 20 Units into the skin 2 (two) times daily.    03/25/2017 at Unknown time  . lisinopril (PRINIVIL,ZESTRIL) 10 MG tablet Take 1 tablet by mouth daily.   03/25/2017 at Unknown time  . loratadine (CLARITIN) 10 MG tablet Take 10 mg by mouth daily as needed for allergies. As needed   Past Week at Unknown time  . magnesium oxide (MAG-OX) 400 MG tablet Take 400 mg by mouth 2 (two) times daily.   03/25/2017 at Unknown time  . metFORMIN (GLUCOPHAGE) 500 MG tablet Take 1,000 mg by mouth 2 (two) times daily with a meal.   03/25/2017 at Unknown time  . metolazone (ZAROXOLYN) 2.5 MG tablet Take 2.5 mg on Sunday and Wednesday ONLY (Patient taking differently: Take 2.5 mg by mouth 2 (two) times a week. Take 2.5 mg on Sunday and Wednesday ONLY) 24 tablet 3 03/23/2017 at Unknown time  . Multiple Vitamin (MULTIVITAMIN) tablet Take 1 tablet by mouth daily.   03/25/2017 at Unknown time  . nitroGLYCERIN (NITROSTAT) 0.4 MG SL tablet Place 0.4 mg  under the tongue every 5 (five) minutes x 3 doses as needed. For chest pain   unknown  . ondansetron (ZOFRAN) 4 MG tablet Take 4 mg by mouth 4 (four) times daily as needed for nausea or vomiting.   unknown  . sertraline (ZOLOFT) 50 MG tablet Take 50 mg by mouth daily.   03/25/2017 at Unknown time  . simvastatin (ZOCOR) 80 MG tablet Take 40 mg by mouth at bedtime.   03/24/2017 at Unknown time  . tiZANidine (ZANAFLEX) 4 MG tablet Take 4 mg by mouth 3 (three) times daily.   03/25/2017 at Unknown time  . warfarin (COUMADIN) 4 MG tablet Take 2 tablets daily except 3 tablets on Wednesdays (Patient taking differently: Take 8-12 mg by mouth See admin instructions. Take 2 tablets daily except 3 tablets on Wednesdays) 163 tablet 3 03/24/2017 at 2200  . potassium chloride SA (K-DUR,KLOR-CON) 20 MEQ tablet Take 1 tablet (20 mEq total) by mouth daily. 90 tablet 3   . traMADol (ULTRAM) 50 MG tablet Take 50-100 mg by mouth every 4 (four) hours as needed.    unknown   Scheduled: . acetaminophen  650 mg Oral Once  . albuterol  2.5 mg Nebulization Q6H  . atorvastatin  40 mg Oral q1800  . carvedilol  6.25 mg Oral BID WC  . diphenhydrAMINE  25 mg Oral Once  . furosemide  20 mg Intravenous Once  . furosemide  80 mg Intravenous Q12H  . gabapentin  300 mg Oral QHS  . insulin aspart  0-15 Units Subcutaneous TID WC  . insulin aspart  0-5 Units Subcutaneous QHS  . lisinopril  10  mg Oral Daily  . [START ON 03/27/2017] metolazone  2.5 mg Oral Once per day on Sun Wed  . pantoprazole (PROTONIX) IV  40 mg Intravenous Q12H  . potassium chloride SA  20 mEq Oral TID  . sertraline  50 mg Oral Daily   Continuous: . sodium chloride    . sodium chloride     [id number], loratadine, traMADol  Assesment: He was admitted with acute on chronic combined systolic and diastolic heart failure. I think this is related to his anemia but I'm going to get an echocardiogram to make sure there has not been a change in his left  ventricular function. He has had severe heart failure and has an implantable cardioverter defibrillator. He had some V. tach last night but did not have defibrillator activation. At baseline he has COPD which is also pretty severe. He has coronary disease but no chest pain. He has hypertension which is pretty well controlled. He has diabetes and is on treatment for that. He has personal history of colon cancer and he now has blood in his stool so is going to need further workup. His hemoglobin level has dropped and with his cardiac disease he needs a blood transfusion Principal Problem:   Acute on chronic combined systolic and diastolic CHF (congestive heart failure) (HCC) Active Problems:   COPD (chronic obstructive pulmonary disease) (HCC)   Diabetes mellitus, type II (HCC)   Hypertension   Hyperlipidemia   Tobacco abuse   Anemia, normocytic normochromic   Biventricular implantable cardioverter-defibrillator in situ   History of colon cancer    Plan: Transfuse 2 units of blood today. Check echocardiogram. GI consultation when they are available. GI has not available here for the next 2 days    LOS: 1 day   Mateo Overbeck L 03/26/2017, 10:26 AM

## 2017-03-26 NOTE — Progress Notes (Signed)
Patient had 7 beat run [employer]

## 2017-03-27 LAB — CBC
HEMATOCRIT: 35.4 % — AB (ref 39.0–52.0)
HEMOGLOBIN: 10.9 g/dL — AB (ref 13.0–17.0)
MCH: 24.3 pg — AB (ref 26.0–34.0)
MCHC: 30.8 g/dL (ref 30.0–36.0)
MCV: 79 fL (ref 78.0–100.0)
Platelets: 228 10*3/uL (ref 150–400)
RBC: 4.48 MIL/uL (ref 4.22–5.81)
RDW: 17.3 % — AB (ref 11.5–15.5)
WBC: 5.2 10*3/uL (ref 4.0–10.5)

## 2017-03-27 LAB — BASIC METABOLIC PANEL
ANION GAP: 10 (ref 5–15)
BUN: 17 mg/dL (ref 6–20)
CO2: 33 mmol/L — AB (ref 22–32)
Calcium: 9 mg/dL (ref 8.9–10.3)
Chloride: 94 mmol/L — ABNORMAL LOW (ref 101–111)
Creatinine, Ser: 1.33 mg/dL — ABNORMAL HIGH (ref 0.61–1.24)
GFR calc Af Amer: >60 mL/min (ref >60)
GFR, EST NON AFRICAN AMERICAN: 57 mL/min — AB (ref >60)
GLUCOSE: 257 mg/dL — AB (ref 65–99)
POTASSIUM: 3.6 mmol/L (ref 3.5–5.1)
Sodium: 137 mmol/L (ref 135–145)

## 2017-03-27 LAB — GLUCOSE, CAPILLARY
Glucose-Capillary: 152 mg/dL — ABNORMAL HIGH (ref 65–99)
Glucose-Capillary: 210 mg/dL — ABNORMAL HIGH (ref 65–99)
Glucose-Capillary: 228 mg/dL — ABNORMAL HIGH (ref 65–99)
Glucose-Capillary: 308 mg/dL — ABNORMAL HIGH (ref 65–99)

## 2017-03-27 LAB — TYPE AND SCREEN
ABO/RH(D): O POS
ANTIBODY SCREEN: NEGATIVE
Unit division: 0
Unit division: 0

## 2017-03-27 LAB — BPAM RBC
Blood Product Expiration Date: 201807052359
Blood Product Expiration Date: 201807062359
ISSUE DATE / TIME: 201806231318
ISSUE DATE / TIME: 201806231734
UNIT TYPE AND RH: 5100
Unit Type and Rh: 5100

## 2017-03-27 LAB — HIV ANTIBODY (ROUTINE TESTING W REFLEX): HIV SCREEN 4TH GENERATION: NONREACTIVE

## 2017-03-27 MED ORDER — ONDANSETRON HCL 4 MG/2ML IJ SOLN: 4.0000 mg | Freq: Four times a day (QID) | INTRAMUSCULAR | Status: DC | PRN

## 2017-03-27 MED ORDER — POTASSIUM CHLORIDE CRYS ER 20 MEQ PO TBCR
40.0000 meq | EXTENDED_RELEASE_TABLET | Freq: Two times a day (BID) | ORAL | Status: DC
  Administered 2017-03-27 – 2017-03-29 (×6): 40 meq via ORAL
  Filled 2017-03-27 (×6): qty 2

## 2017-03-27 MED ORDER — INSULIN GLARGINE 100 UNIT/ML ~~LOC~~ SOLN
10.0000 [IU] | Freq: Every day | SUBCUTANEOUS | Status: DC
  Administered 2017-03-27: 10 [IU] via SUBCUTANEOUS
  Filled 2017-03-27: qty 0.1

## 2017-03-27 MED ORDER — ALBUTEROL SULFATE (2.5 MG/3ML) 0.083% IN NEBU
2.5000 mg | INHALATION_SOLUTION | Freq: Three times a day (TID) | RESPIRATORY_TRACT | Status: DC
  Administered 2017-03-27 – 2017-03-28 (×4): 2.5 mg via RESPIRATORY_TRACT
  Filled 2017-03-27 (×4): qty 3

## 2017-03-27 MED ORDER — ONDANSETRON HCL 4 MG PO TABS: 4.0000 mg | ORAL_TABLET | Freq: Four times a day (QID) | ORAL | Status: DC | PRN

## 2017-03-27 NOTE — Progress Notes (Signed)
Patient had a 4-5 beat run of VTach

## 2017-03-27 NOTE — Progress Notes (Signed)
Subjective: He says he feels better. He's stronger after blood transfusion. He is down about 15 pounds. He has no complaints now except he had some nausea. No chest pain abdominal pain PND or orthopnea  Objective: Vital signs in last 24 hours: Temp:  [97.8 F (36.6 C)-98.8 F (37.1 C)] 98.1 F (36.7 C) (06/24 0634) Pulse Rate:  [59-71] 69 (06/24 0634) Resp:  [16-18] 18 (06/24 0634) BP: (139-159)/(71-84) 152/78 (06/24 0634) SpO2:  [93 %-100 %] 100 % (06/24 0634) Weight:  [79.7 kg (175 lb 11.2 oz)] 79.7 kg (175 lb 11.2 oz) (06/24 0634) Weight change: -6.94 kg (-15 lb 4.8 oz)    Intake/Output from previous day: 06/23 0701 - 06/24 0700 In: 2598 [P.O.:1560; Blood:1038] Out: 4760 [Urine:4760]  PHYSICAL EXAM General appearance: alert, cooperative and no distress Resp: clear to auscultation bilaterally Cardio: irregularly irregular rhythm GI: soft, non-tender; bowel sounds normal; no masses,  no organomegaly Extremities: venous stasis dermatitis noted Mucous membranes are moist  Lab Results:  Results for orders placed or performed during the hospital encounter of 03/25/17 (from the past 48 hour(s))  Type and screen     Status: None   Collection Time: 03/25/17  6:42 PM  Result Value Ref Range   ABO/RH(D) O POS    Antibody Screen NEG    Sample Expiration 03/28/2017    Unit Number [id number]    Blood Component Type RED CELLS,LR    Unit division 00    Status of Unit ISSUED,FINAL    Transfusion Status OK TO TRANSFUSE    Crossmatch Result Compatible    Unit Number [id number]    Blood Component Type RED CELLS,LR    Unit division 00    Status of Unit ISSUED,FINAL    Transfusion Status OK TO TRANSFUSE    Crossmatch Result Compatible   Protime-INR     Status: Abnormal   Collection Time: 03/25/17  6:42 PM  Result Value Ref Range   Prothrombin Time 22.5 (H) 11.4 - 15.2 seconds   INR 1.95   Vitamin B12     Status: None   Collection Time: 03/25/17  6:42 PM  Result Value Ref  Range   Vitamin B-12 260 180 - 914 pg/mL    Comment: (NOTE) This assay is not validated for testing neonatal or myeloproliferative syndrome specimens for Vitamin B12 levels. Performed at [facility], [place] [address], [place], [place] [address]   Folate     Status: None   Collection Time: 03/25/17  6:42 PM  Result Value Ref Range   Folate 44.0 >5.9 ng/mL    Comment: RESULT CONFIRMED BY AUTOMATED DILUTION Performed at [facility], [place] [address], [place], [place] [address]   Iron and TIBC     Status: Abnormal   Collection Time: 03/25/17  6:42 PM  Result Value Ref Range   Iron 31 (L) 45 - 182 ug/dL   TIBC 468 (H) 250 - 450 ug/dL   Saturation Ratios 7 (L) 17.9 - 39.5 %   UIBC 437 ug/dL    Comment: Performed at [facility], [place] [address] [place], [place] [address]  Ferritin     Status: Abnormal   Collection Time: 03/25/17  6:42 PM  Result Value Ref Range   Ferritin 18 (L) 24 - 336 ng/mL    Comment: Performed at [facility], [place] [address] [place], [place] [address]  Reticulocytes     Status: Abnormal   Collection Time: 03/25/17  6:42 PM  Result Value Ref Range   Retic Ct Pct 3.0 0.4 - 3.1 %   RBC. 3.30 (L) 4.22 - 5.81 MIL/uL   Retic Count, Absolute 99.0 19.0 - 186.0 K/uL  Prepare RBC     Status: None   Collection Time: 03/25/17  6:42 PM  Result Value Ref Range   Order Confirmation ORDER PROCESSED BY BLOOD BANK   ABO/Rh     Status: None   Collection Time: 03/25/17  6:42 PM  Result Value Ref Range   ABO/RH(D) O POS   I-stat troponin, ED     Status: None   Collection Time: 03/25/17  7:41 PM  Result Value Ref Range   Troponin i, poc 0.01 0.00 - 0.08 ng/mL   Comment 3            Comment: Due to the release kinetics of cTnI, a negative result within the first hours of the onset of symptoms does not rule out myocardial infarction with certainty. If myocardial infarction is still suspected, repeat the test at appropriate intervals.   POC occult  blood, ED Provider will collect     Status: Abnormal   Collection Time: 03/25/17  7:51 PM  Result Value Ref Range   Fecal Occult Bld POSITIVE (A) NEGATIVE  HIV antibody (Routine Testing)     Status: None   Collection Time: 03/25/17 11:21 PM  Result Value Ref Range   HIV Screen 4th Generation wRfx Non Reactive Non Reactive    Comment: (NOTE) Performed At: [facility] [place], [place] [id number] Lindon Romp MD [id number]   CBC     Status: Abnormal   Collection Time: 03/25/17 11:21 PM  Result Value Ref Range   WBC 7.2 4.0 - 10.5 K/uL   RBC 3.56 (L) 4.22 - 5.81 MIL/uL   Hemoglobin 8.4 (L) 13.0 - 17.0 g/dL   HCT 28.3 (L) 39.0 - 52.0 %   MCV 79.5 78.0 - 100.0 fL   MCH 23.6 (L) 26.0 - 34.0 pg   MCHC 29.7 (L) 30.0 - 36.0 g/dL   RDW 18.4 (H) 11.5 - 15.5 %   Platelets 211 150 - 400 K/uL  Glucose, capillary     Status: Abnormal   Collection Time: 03/26/17 12:47 AM  Result Value Ref Range   Glucose-Capillary 276 (H) 65 - 99 mg/dL  Basic metabolic panel     Status: Abnormal   Collection Time: 03/26/17  6:30 AM  Result Value Ref Range   Sodium 140 135 - 145 mmol/L   Potassium 3.7 3.5 - 5.1 mmol/L    Comment: DELTA CHECK NOTED   Chloride 100 (L) 101 - 111 mmol/L   CO2 32 22 - 32 mmol/L   Glucose, Bld 105 (H) 65 - 99 mg/dL   BUN 16 6 - 20 mg/dL   Creatinine, Ser 1.21 0.61 - 1.24 mg/dL   Calcium 9.1 8.9 - 10.3 mg/dL   GFR calc non Af Amer >60 >60 mL/min   GFR calc Af Amer >60 >60 mL/min    Comment: (NOTE) The eGFR has been calculated using the CKD EPI equation. This calculation has not been validated in all clinical situations. eGFR's persistently <60 mL/min signify possible Chronic Kidney Disease.    Anion gap 8 5 - 15  CBC     Status: Abnormal   Collection Time: 03/26/17  6:30 AM  Result Value Ref Range   WBC 5.3 4.0 - 10.5 K/uL   RBC 3.34 (L) 4.22 - 5.81 MIL/uL  Hemoglobin 7.7 (L) 13.0 - 17.0 g/dL   HCT 26.1 (L) 39.0 - 52.0 %   MCV 78.1 78.0  - 100.0 fL   MCH 23.1 (L) 26.0 - 34.0 pg   MCHC 29.5 (L) 30.0 - 36.0 g/dL   RDW 18.5 (H) 11.5 - 15.5 %   Platelets 189 150 - 400 K/uL    Comment: SPECIMEN CHECKED FOR CLOTS PLATELET COUNT CONFIRMED BY SMEAR   Glucose, capillary     Status: Abnormal   Collection Time: 03/26/17  7:54 AM  Result Value Ref Range   Glucose-Capillary 100 (H) 65 - 99 mg/dL  Glucose, capillary     Status: Abnormal   Collection Time: 03/26/17 12:00 PM  Result Value Ref Range   Glucose-Capillary 238 (H) 65 - 99 mg/dL  Glucose, capillary     Status: Abnormal   Collection Time: 03/26/17  5:25 PM  Result Value Ref Range   Glucose-Capillary 241 (H) 65 - 99 mg/dL  Glucose, capillary     Status: Abnormal   Collection Time: 03/26/17  9:08 PM  Result Value Ref Range   Glucose-Capillary 210 (H) 65 - 99 mg/dL  CBC     Status: Abnormal   Collection Time: 03/26/17 10:03 PM  Result Value Ref Range   WBC 5.0 4.0 - 10.5 K/uL   RBC 4.33 4.22 - 5.81 MIL/uL   Hemoglobin 10.9 (L) 13.0 - 17.0 g/dL    Comment: POST TRANSFUSION SPECIMEN   HCT 34.1 (L) 39.0 - 52.0 %   MCV 78.8 78.0 - 100.0 fL   MCH 25.2 (L) 26.0 - 34.0 pg   MCHC 32.0 30.0 - 36.0 g/dL   RDW 17.3 (H) 11.5 - 15.5 %   Platelets 213 150 - 400 K/uL  Basic metabolic panel     Status: Abnormal   Collection Time: 03/27/17  6:00 AM  Result Value Ref Range   Sodium 137 135 - 145 mmol/L   Potassium 3.6 3.5 - 5.1 mmol/L   Chloride 94 (L) 101 - 111 mmol/L   CO2 33 (H) 22 - 32 mmol/L   Glucose, Bld 257 (H) 65 - 99 mg/dL   BUN 17 6 - 20 mg/dL   Creatinine, Ser 1.33 (H) 0.61 - 1.24 mg/dL   Calcium 9.0 8.9 - 10.3 mg/dL   GFR calc non Af Amer 57 (L) >60 mL/min   GFR calc Af Amer >60 >60 mL/min    Comment: (NOTE) The eGFR has been calculated using the CKD EPI equation. This calculation has not been validated in all clinical situations. eGFR's persistently <60 mL/min signify possible Chronic Kidney Disease.    Anion gap 10 5 - 15    ABGS No results for  input(s): PHART, PO2ART, TCO2, HCO3 in the last 72 hours.  Invalid input(s): PCO2 CULTURES No results found for this or any previous visit (from the past 240 hour(s)). Studies/Results: Dg Chest 2 View  Result Date: 03/25/2017 CLINICAL DATA:  61 year old male with shortness of breath. EXAM: CHEST  2 VIEW COMPARISON:  Chest radiograph dated 09/09/2016 FINDINGS: There is mild central vascular prominence and diffuse interstitial densities most consistent with mild vascular congestion and interstitial edema. Bibasilar atelectatic changes/scarring. Infiltrate is not excluded. There is a trace right pleural effusion. No pneumothorax. Top-normal cardiac size. Coronary vascular calcification. Left pectoral AICD device. Degenerative changes of the spine. No acute osseous pathology. IMPRESSION: 1. Mild vascular congestion and interstitial edema, increased compared to prior radiograph. 2. Bibasilar atelectatic changes/scarring. Infiltrate is not excluded. Clinical correlation  is recommended. 3. Probable trace right pleural effusion. 4. Coronary vascular disease. Electronically Signed   By: Anner Crete M.D.   On: 03/25/2017 20:44    Medications:  Prior to Admission:  Prescriptions Prior to Admission  Medication Sig Dispense Refill Last Dose  . acetaminophen (TYLENOL) 500 MG tablet Take 1 tablet by mouth as needed.   unknown  . albuterol (PROVENTIL HFA;VENTOLIN HFA) 108 (90 BASE) MCG/ACT inhaler Inhale 2 puffs into the lungs every 6 (six) hours as needed. For shortness of breath   unknown  . albuterol (PROVENTIL) (2.5 MG/3ML) 0.083% nebulizer solution Take 2.5 mg by nebulization every 6 (six) hours as needed. For shortness of breath   Past Week at Unknown time  . aspirin EC 81 MG tablet Take 1 tablet by mouth daily.   03/25/2017 at Unknown time  . carvedilol (COREG) 6.25 MG tablet Take 6.25 mg by mouth 2 (two) times daily with a meal.    03/25/2017 at 1330  . furosemide (LASIX) 40 MG tablet Take 1 tablet  (40 mg total) by mouth 2 (two) times daily. (Patient taking differently: Take 20-40 mg by mouth See admin instructions. 6m in the morning and 268min the evening) 180 tablet 3 03/25/2017 at Unknown time  . gabapentin (NEURONTIN) 300 MG capsule Take 300 mg by mouth at bedtime.   03/24/2017 at Unknown time  . insulin glargine (LANTUS) 100 UNIT/ML injection Inject 20 Units into the skin 2 (two) times daily.    03/25/2017 at Unknown time  . lisinopril (PRINIVIL,ZESTRIL) 10 MG tablet Take 1 tablet by mouth daily.   03/25/2017 at Unknown time  . loratadine (CLARITIN) 10 MG tablet Take 10 mg by mouth daily as needed for allergies. As needed   Past Week at Unknown time  . magnesium oxide (MAG-OX) 400 MG tablet Take 400 mg by mouth 2 (two) times daily.   03/25/2017 at Unknown time  . metFORMIN (GLUCOPHAGE) 500 MG tablet Take 1,000 mg by mouth 2 (two) times daily with a meal.   03/25/2017 at Unknown time  . metolazone (ZAROXOLYN) 2.5 MG tablet Take 2.5 mg on Sunday and Wednesday ONLY (Patient taking differently: Take 2.5 mg by mouth 2 (two) times a week. Take 2.5 mg on Sunday and Wednesday ONLY) 24 tablet 3 03/23/2017 at Unknown time  . Multiple Vitamin (MULTIVITAMIN) tablet Take 1 tablet by mouth daily.   03/25/2017 at Unknown time  . nitroGLYCERIN (NITROSTAT) 0.4 MG SL tablet Place 0.4 mg under the tongue every 5 (five) minutes x 3 doses as needed. For chest pain   unknown  . ondansetron (ZOFRAN) 4 MG tablet Take 4 mg by mouth 4 (four) times daily as needed for nausea or vomiting.   unknown  . sertraline (ZOLOFT) 50 MG tablet Take 50 mg by mouth daily.   03/25/2017 at Unknown time  . simvastatin (ZOCOR) 80 MG tablet Take 40 mg by mouth at bedtime.   03/24/2017 at Unknown time  . tiZANidine (ZANAFLEX) 4 MG tablet Take 4 mg by mouth 3 (three) times daily.   03/25/2017 at Unknown time  . warfarin (COUMADIN) 4 MG tablet Take 2 tablets daily except 3 tablets on Wednesdays (Patient taking differently: Take 8-12 mg by mouth  See admin instructions. Take 2 tablets daily except 3 tablets on Wednesdays) 163 tablet 3 03/24/2017 at 2200  . potassium chloride SA (K-DUR,KLOR-CON) 20 MEQ tablet Take 1 tablet (20 mEq total) by mouth daily. 90 tablet 3   . traMADol (ULTRAM) 50 MG  tablet Take 50-100 mg by mouth every 4 (four) hours as needed.    unknown   Scheduled: . albuterol  2.5 mg Nebulization TID  . atorvastatin  40 mg Oral q1800  . carvedilol  6.25 mg Oral BID WC  . furosemide  80 mg Intravenous Q12H  . gabapentin  300 mg Oral QHS  . insulin aspart  0-15 Units Subcutaneous TID WC  . insulin aspart  0-5 Units Subcutaneous QHS  . insulin glargine  10 Units Subcutaneous QHS  . lisinopril  10 mg Oral Daily  . metolazone  2.5 mg Oral Once per day on Sun Wed  . pantoprazole (PROTONIX) IV  40 mg Intravenous Q12H  . potassium chloride SA  40 mEq Oral BID  . sertraline  50 mg Oral Daily   Continuous: . sodium chloride     UYE:[id number], loratadine, ondansetron (ZOFRAN) IV, ondansetron, traMADol  Assesment: He was admitted with acute on chronic combined systolic and diastolic heart failure. Additionally he has COPD which is doing okay. He has coronary disease but has not had any chest pain. He has chronic atrial fibrillation and is chronically anticoagulated which is being held now. He has defibrillator in place which has not fired recently. He is anemic and has heme positive stools so he will need GI consultation. He has history of colon cancer. He has diabetes and his blood sugar is not as well-controlled as I would like some going to add Lantus Principal Problem:   Acute on chronic combined systolic and diastolic CHF (congestive heart failure) (HCC) Active Problems:   COPD (chronic obstructive pulmonary disease) (HCC)   Arteriosclerotic cardiovascular disease (ASCVD)   Diabetes mellitus, type II (HCC)   Hypertension   Hyperlipidemia   Tobacco abuse   Anemia, normocytic normochromic   Biventricular implantable  cardioverter-defibrillator in situ   Atrial fibrillation (HCC)   History of colon cancer    Plan: Add Lantus. Recheck labs in the morning. Cardiology and GI consults tomorrow    LOS: 2 days   Anderson Coppock L 03/27/2017, 7:40 AM

## 2017-03-28 ENCOUNTER — Ambulatory Visit: Payer: Self-pay | Admitting: Adult Health

## 2017-03-28 ENCOUNTER — Encounter (HOSPITAL_COMMUNITY): Payer: Self-pay | Admitting: Gastroenterology

## 2017-03-28 DIAGNOSIS — R195 Other fecal abnormalities: Secondary | ICD-10-CM

## 2017-03-28 LAB — GLUCOSE, CAPILLARY
GLUCOSE-CAPILLARY: 224 mg/dL — AB (ref 65–99)
GLUCOSE-CAPILLARY: 105 mg/dL — AB (ref 65–99)
GLUCOSE-CAPILLARY: 256 mg/dL — AB (ref 65–99)
Glucose-Capillary: 254 mg/dL — ABNORMAL HIGH (ref 65–99)

## 2017-03-28 LAB — CBC WITH DIFFERENTIAL/PLATELET
BASOS ABS: 0.1 10*3/uL (ref 0.0–0.1)
Basophils Relative: 1 %
EOS ABS: 0.2 10*3/uL (ref 0.0–0.7)
EOS PCT: 3 %
HCT: 36.6 % — ABNORMAL LOW (ref 39.0–52.0)
Hemoglobin: 11.3 g/dL — ABNORMAL LOW (ref 13.0–17.0)
Lymphocytes Relative: 19 %
Lymphs Abs: 1.1 10*3/uL (ref 0.7–4.0)
MCH: 24.5 pg — ABNORMAL LOW (ref 26.0–34.0)
MCHC: 30.9 g/dL (ref 30.0–36.0)
MCV: 79.2 fL (ref 78.0–100.0)
MONO ABS: 0.7 10*3/uL (ref 0.1–1.0)
Monocytes Relative: 11 %
Neutro Abs: 3.8 10*3/uL (ref 1.7–7.7)
Neutrophils Relative %: 65 %
PLATELETS: 215 10*3/uL (ref 150–400)
RBC: 4.62 MIL/uL (ref 4.22–5.81)
RDW: 17.7 % — AB (ref 11.5–15.5)
WBC: 5.9 10*3/uL (ref 4.0–10.5)

## 2017-03-28 LAB — PROTIME-INR
INR: 1.21
PROTHROMBIN TIME: 15.3 s — AB (ref 11.4–15.2)

## 2017-03-28 LAB — BASIC METABOLIC PANEL
Anion gap: 10 (ref 5–15)
BUN: 27 mg/dL — AB (ref 6–20)
CALCIUM: 9 mg/dL (ref 8.9–10.3)
CO2: 32 mmol/L (ref 22–32)
CREATININE: 1.53 mg/dL — AB (ref 0.61–1.24)
Chloride: 95 mmol/L — ABNORMAL LOW (ref 101–111)
GFR calc Af Amer: 55 mL/min — ABNORMAL LOW (ref >60)
GFR, EST NON AFRICAN AMERICAN: 48 mL/min — AB (ref >60)
GLUCOSE: 209 mg/dL — AB (ref 65–99)
Potassium: 4.3 mmol/L (ref 3.5–5.1)
SODIUM: 137 mmol/L (ref 135–145)

## 2017-03-28 MED ORDER — ALBUTEROL SULFATE (2.5 MG/3ML) 0.083% IN NEBU
2.5000 mg | INHALATION_SOLUTION | Freq: Two times a day (BID) | RESPIRATORY_TRACT | Status: DC
  Administered 2017-03-28 – 2017-03-31 (×6): 2.5 mg via RESPIRATORY_TRACT
  Filled 2017-03-28 (×6): qty 3

## 2017-03-28 MED ORDER — PANTOPRAZOLE SODIUM 40 MG PO TBEC
40.0000 mg | DELAYED_RELEASE_TABLET | Freq: Every day | ORAL | Status: DC
  Administered 2017-03-29 – 2017-03-31 (×3): 40 mg via ORAL
  Filled 2017-03-28 (×3): qty 1

## 2017-03-28 MED ORDER — INSULIN GLARGINE 100 UNIT/ML ~~LOC~~ SOLN
7.0000 [IU] | Freq: Every day | SUBCUTANEOUS | Status: DC
  Administered 2017-03-28 – 2017-03-30 (×3): 7 [IU] via SUBCUTANEOUS
  Filled 2017-03-28 (×4): qty 0.07

## 2017-03-28 MED ORDER — FUROSEMIDE 10 MG/ML IJ SOLN
40.0000 mg | Freq: Two times a day (BID) | INTRAMUSCULAR | Status: DC
  Administered 2017-03-28: 40 mg via INTRAVENOUS

## 2017-03-28 MED ORDER — INSULIN GLARGINE 100 UNIT/ML ~~LOC~~ SOLN
14.0000 [IU] | Freq: Every day | SUBCUTANEOUS | Status: DC
  Filled 2017-03-28: qty 0.14

## 2017-03-28 NOTE — Consult Note (Signed)
Cardiology Consultation:   Patient ID: [name]; [id number]; 08/17/1956   Admit date: 03/25/2017 Date of Consult: 03/28/2017  Primary Care Provider: Sinda Du, MD Primary Cardiologist: Cristopher Peru MD Primary Electrophysiologist:  Cristopher Peru MD   Patient Profile:   [name] is a 61 y.o. male with a hx of Atrial fibrillation, CAD, ischemic cardiomyopathy ICD in situ, chronic conbined CHF, who is being seen today for the evaluation of  decompensated CHF, atrial fibrillation,at the request of Dr. Luan Pulling.   History of Present Illness:    The patient was actually seen in our office on Friday, 03/26/2017 at his request due to worsening shortness of breath, lower extremity edema, and fatigue. Other history includes history of complete heart block status post ICD in situ, CAD with stent to the LAD in 2006, anemia, hypertension, type 2 diabetes, ongoing tobacco abuse.  In the office he complained of 12 pound weight gain over 3 weeks, admitted to dietary indiscretion with eating salty foods and fast foods. During office visit he was advised to increase his Lasix and doubling it to 40 mg twice a day, and take metolazone 2.5 mg daily incentive twice a week. He was to have close follow-up in the office in 3 days. Labs were ordered.Will  He was found to be anemic, hemoglobin 7.7 on Coumadin. He also had elevated BNP of 1200, and an evidence of decompensated CHF. He was advised to the emergency room for further evaluation, diuresis, and evaluation of anemia. Since admission, the patient's Coumadin has been discontinued in the setting of anemia and Hemoccult-positive stool. He has been given 2 units of packed red blood cells, placed on IV diuretics of Lasix 80 mg IV every 12 hours.   Labs completed on day of admission leading to hospitalization: Hemoglobin 7.7; hematocrit 26.1; white blood cells 5.3; platelets 189. Sodium 140; potassium 3.7; chloride 100; CO2 32; glucose 15;  creatinine 1.21; BUN 16.  CXR: 03/25/2017 IMPRESSION: 1. Mild vascular congestion and interstitial edema, increased compared to prior radiograph. 2. Bibasilar atelectatic changes/scarring. Infiltrate is not excluded. Clinical correlation is recommended. 3. Probable trace right pleural effusion. 4. Coronary vascular disease. Past Medical History:  Diagnosis Date  . Anemia, normocytic normochromic 2008   2008  . Arteriosclerotic cardiovascular disease (ASCVD)    Acute MI in 2006->  LAD stent, EF-37%  . Arthritis    Hx: of in hands  . Automatic implantable cardioverter-defibrillator in situ   . Carpal tunnel syndrome   . Colon cancer ([place]) 2006   Partial colectomy in 2006  . Complete heart block ([place])    S/P ICD 02/09/13  . COPD (chronic obstructive pulmonary disease) ([place])   . Coronary artery disease   . Diabetes mellitus, type II ([place])    With peripheral neuropathy  . Edema    venous insufficiency  . H/O alcohol dependence ([place])   . H/O hiatal hernia   . Hyperlipidemia   . Hypertension   . Myocardial infarction [facility])    Hx: of 2006  . Neuropathy in diabetes ([place])    Hx: of  . Pacemaker   . Pneumonia 01/2012   01/2012; subsequent admission for chest pain  . Shortness of breath    Hx: of   . Tobacco abuse     Past Surgical History:  Procedure Laterality Date  . ANKLE SURGERY     Right  . Biventricular ICD Placement  02/09/2013   Dr. Lovena Le  . COLONOSCOPY  08/17/2005   Pancolonic  diverticula/Multiple rectal polyps removed as described above. The larger of the two likely responsible for intermittent hematochezia/ Multiple polyps at the hepatic flexure resected with a snare./ Large polypoid lesion growing out of the base of the cecum, just behind the  ileocecal valve. This was not felt to be amendable to endoscopic resection. Biopsied multiple times  . CORONARY ANGIOPLASTY WITH STENT PLACEMENT    . FLEXIBLE SIGMOIDOSCOPY    06/13/2006   Essentially normal residual rectum  status post total colectomy. anastomosis at 25cm.  Focal area of abnormality adjacent to suture, as described above, likely not significant, suspect granulation tissue, biopsied.  Anastomosis otherwise appeared normal.  Distal terminal ileum appeared normal  . FLEXIBLE SIGMOIDOSCOPY N/A 01/13/2015   Dr. Gala Romney: normal-appearing residual rectum, surgical anastomosis, s/p subtotal colectomy. Surveillance 5 years   . INGUINAL HERNIA REPAIR    . MULTIPLE EXTRACTIONS WITH ALVEOLOPLASTY N/A 04/09/2013   Procedure: Extraction of tooth #'s 1,2,4,5,6,7,8,9,10,11,12,13,17,18,20,21,22,23,27,28, 29,30,31, and 32 with alveoloplasty.;  Surgeon: Lenn Cal, DDS;  Location: [place];  Service: Oral Surgery;  Laterality: N/A;  . PERMANENT PACEMAKER INSERTION N/A 02/09/2013   Procedure: PERMANENT PACEMAKER INSERTION;  Surgeon: Evans Lance, MD;  Location: [facility] CATH LAB;  Service: Cardiovascular;  Laterality: N/A;  . SUBTOTAL COLECTOMY  09/01/2005   subtotal colectomy  . TEMPORARY PACEMAKER INSERTION Right 02/09/2013   Procedure: TEMPORARY PACEMAKER INSERTION;  Surgeon: Sinclair Grooms, MD;  Location: [facility] CATH LAB;  Service: Cardiovascular;  Laterality: Right;     Inpatient Medications: Scheduled Meds: . albuterol  2.5 mg Nebulization TID  . atorvastatin  40 mg Oral q1800  . carvedilol  6.25 mg Oral BID WC  . furosemide  80 mg Intravenous Q12H  . gabapentin  300 mg Oral QHS  . insulin aspart  0-15 Units Subcutaneous TID WC  . insulin aspart  0-5 Units Subcutaneous QHS  . insulin glargine  10 Units Subcutaneous QHS  . lisinopril  10 mg Oral Daily  . metolazone  2.5 mg Oral Once per day on Sun Wed  . pantoprazole (PROTONIX) IV  40 mg Intravenous Q12H  . potassium chloride SA  40 mEq Oral BID  . sertraline  50 mg Oral Daily   Continuous Infusions: . sodium chloride     PRN Meds: acetaminophen, loratadine, ondansetron (ZOFRAN) IV, ondansetron, traMADol  Allergies:    Allergies  Allergen Reactions  .  Vancomycin Itching and Rash    Social History:   Social History   Social History  . Marital status: Married    Spouse name: N/A  . Number of children: 1  . Years of education: N/A   Occupational History  . Parts Manager     Disabled   Social History Main Topics  . Smoking status: Current Every Day Smoker    Packs/day: 1.00    Years: 40.00    Types: Cigarettes    Start date: 07/13/1971  . Smokeless tobacco: Never Used  . Alcohol use No     Comment: Quit 2005 from a 6 pack a day use  . Drug use: No  . Sexual activity: No   Other Topics Concern  . Not on file   Social History Narrative  . No narrative on file    Family History:   The patient's family history includes Colon cancer in his mother; Diabetes in his brother; Heart disease in his brother, father, and mother; Hypertension in his brother. There is no history of Liver disease.  ROS:  Please  see the history of present illness.  ROS  All other ROS reviewed and negative.     Physical Exam/Data:   Vitals:   03/27/17 2241 03/28/17 0600 03/28/17 0632 03/28/17 0821  BP: 131/71 118/70    Pulse: 75 71    Resp: 19 20    Temp: 97.5 F (36.4 C) 97.7 F (36.5 C)    TempSrc: Oral Oral    SpO2: 96% 98%  95%  Weight:   176 lb 8 oz (80.1 kg)   Height:        Intake/Output Summary (Last 24 hours) at 03/28/17 0823 Last data filed at 03/28/17 0600  Gross per 24 hour  Intake             1460 ml  Output             2680 ml  Net            -1220 ml   Filed Weights   03/26/17 0456 03/27/17 0634 03/28/17 6789  Weight: 181 lb 1.6 oz (82.1 kg) 175 lb 11.2 oz (79.7 kg) 176 lb 8 oz (80.1 kg)   Body mass index is 23.94 kg/m.  General:  Well nourished, well developed, in no acute distress.  HEENT: normal Lymph: no adenopathy Neck: no JVD Endocrine:  No thryomegaly Vascular: No carotid bruits; FA pulses 2+ bilaterally without bruits  Cardiac:  normal S1, S2; RRR; 1/6 systolic murmur.  Lungs:  clear to auscultation  bilaterally, no wheezing, rhonchi or rales Wearing O2.  Abd: soft, nontender, no hepatomegaly  Ext: no edema Musculoskeletal:  No deformities, BUE and BLE strength normal and equal Skin: warm and dry  Neuro:  CNs 2-12 intact, no focal abnormalities noted Psych:  Normal affect   EKG:  The EKG was personally reviewed and demonstrates: Paced rhythm with underlying atrial fib.  Telemetry:  Telemetry was personally reviewed and demonstrates:  Paced rhythm.  Relevant CV Studies: Echo 03/26/2017 Left ventricle: The cavity size was severely dilated. Systolic   function was severely reduced. The estimated ejection fraction   was in the range of 20% to 25%. Diffuse hypokinesis. Akinesis of   the apicalinferior and apical myocardium. Features are consistent   with a pseudonormal left ventricular filling pattern, with   concomitant abnormal relaxation and increased filling pressure   (grade 2 diastolic dysfunction). Doppler parameters are   consistent with high ventricular filling pressure. - Aortic valve: Transvalvular velocity was within the normal range.   There was no stenosis. There was mild regurgitation. - Mitral valve: Transvalvular velocity was within the normal range.   There was no evidence for stenosis. There was mild regurgitation. - Left atrium: The atrium was severely dilated. - Right ventricle: The cavity size was mildly dilated. Wall   thickness was normal. Systolic function was normal. - Tricuspid valve: There was trivial regurgitation. - Pulmonary arteries: Systolic pressure was mildly increased. PA   peak pressure: 41 mm Hg (S).  ICD interrogation 12/09/2016 Conclusion   CRT-D device check in office. RA/RV thresholds and sensing consistent with previous device measurements. RA/RV lead impedance trends stable over time. LV threshold increase noted--now 2.1@0 .58ms, 1.7@1 .52ms (LVtip to LVring), LV impedance >2500ohms (tip to ring). Impedances were measured in all LV  configurations, all LVtip configs had impedances >2000 ohms. LVring impedances were all <500ohms. LV config reprogrammed to LVring to can, output fixed at 2.0@1 .66ms--ok per GT. No mode switch episodes recorded.  (1) ventricular arrhythmia episode recorded x 5bts. Patient bi-ventricularly pacing 100%  of the time. Device programmed with appropriate safety margins. Estimated longevity 7 years. Patient enrolled in remote follow up. Plan to follow up via [employer]     Laboratory Data:  Chemistry Recent Labs Lab 03/26/17 0630 03/27/17 0600 03/28/17 0612  NA 140 137 137  K 3.7 3.6 4.3  CL 100* 94* 95*  CO2 32 33* 32  GLUCOSE 105* 257* 209*  BUN 16 17 27*  CREATININE 1.21 1.33* 1.53*  CALCIUM 9.1 9.0 9.0  GFRNONAA >60 57* 48*  GFRAA >60 >60 55*  ANIONGAP 8 10 10     No results for input(s): PROT, ALBUMIN, AST, ALT, ALKPHOS, BILITOT in the last 168 hours. Hematology Recent Labs Lab 03/26/17 2203 03/27/17 1018 03/28/17 0612  WBC 5.0 5.2 5.9  RBC 4.33 4.48 4.62  HGB 10.9* 10.9* 11.3*  HCT 34.1* 35.4* 36.6*  MCV 78.8 79.0 79.2  MCH 25.2* 24.3* 24.5*  MCHC 32.0 30.8 30.9  RDW 17.3* 17.3* 17.7*  PLT 213 228 215   Cardiac EnzymesNo results for input(s): TROPONINI in the last 168 hours.  Recent Labs Lab 03/25/17 1941  TROPIPOC 0.01    BNP Recent Labs Lab 03/25/17 1534  BNP 1,210.0*    DDimer No results for input(s): DDIMER in the last 168 hours.  Radiology/Studies:  Dg Chest 2 View  Result Date: 03/25/2017 CLINICAL DATA:  61 year old male with shortness of breath. EXAM: CHEST  2 VIEW COMPARISON:  Chest radiograph dated 09/09/2016 FINDINGS: There is mild central vascular prominence and diffuse interstitial densities most consistent with mild vascular congestion and interstitial edema. Bibasilar atelectatic changes/scarring. Infiltrate is not excluded. There is a trace right pleural effusion. No pneumothorax. Top-normal cardiac size. Coronary vascular calcification. Left  pectoral AICD device. Degenerative changes of the spine. No acute osseous pathology. IMPRESSION: 1. Mild vascular congestion and interstitial edema, increased compared to prior radiograph. 2. Bibasilar atelectatic changes/scarring. Infiltrate is not excluded. Clinical correlation is recommended. 3. Probable trace right pleural effusion. 4. Coronary vascular disease. Electronically Signed   By: Anner Crete M.D.   On: 03/25/2017 20:44    Assessment and Plan:   1. Acute on chronic mixed decompensated CHF: Multifactorial. The patient does admit to dietary noncompliance eating salty or free salted foods, had gained approximately 12 pounds by the time we saw him in the office 3 days ago. He was extremely dyspneic. He did not wish to be admitted. I began increased doses of diuretics, and daily metolazone to see if we can treat him as an outpatient with close follow-up.  However labs were completed revealing significant anemia with CHF. With overall status observed in the office on assessment coupled with labs he was advised to be admitted to the hospital and sent to ER on 03/25/2017. Significant anemia likely had influence on decompensated heart failure.  He has since diuresed 5.8 liters with significant improvement in breathing status, edema, and abdominal distention. He remains on IV Lasix 80 mg twice a day. Review of current labs demonstrate creatinine 1.53 slow elevation since admission. CO2 32. He does not have evidence of significant edema on exam. Can likely decrease IV Lasix to 40 mg twice a day, and begin transitioning back to by mouth. He remains on metolazone on Sundays and Wednesdays.  2. Atrial fibrillation: The patient is been taken off of Coumadin therapy in the setting of anemia with positive Hemoccult stools. He has been given 2 units of packed red blood cells with significant improvement in status. He denied any abdominal pain. Hemoglobin  is now 11.3 hematocrit 36.6. Heart rate is  currently well-controlled. Continue carvedilol 6.25 mg twice a day. Agree with holding Coumadin until GI evaluates further. He denied any melena.  3. Ischemic cardiomyopathy: The patient had an echocardiogram completed during this hospitalization. This revealed LVEF of 20-25% with diffuse hypokinesis. Grade 2 diastolic dysfunction. Left atrium was severely dilated. No valvular abnormalities.   he does have ICD in situ. Evidence of ventricular tachycardia without ICD discharge. I will have this interrogated his is not been interrogated since March although he had been set up for remote evaluation. Would like to evaluate further burden of ventricular tachycardia. This will help to address medical management.   Close follow-up with device interrogation will be reinforced. Threshold impedance would be valuable to evaluate more closely to avoid decompensation.  4. Anemia: Patient has an given packed red blood cells, with significant improvement in overall status. GI is to see for evaluation today. He has had a history of colon cancer. He denies any melena. He has had some hemoptysis. Coumadin on hold currently. May have contributed to decompensated CHF along with dietary noncompliance.  5. Coronary artery disease: s/p anterior STEMI and BMS PCI to mid-LAD May 2006, in-stent restenosis requiring DES PCI to mid-LAD Feb 2007 Management with beta blocker, ACE inhibitor, and statin therapy. No plans for ischemic workup at this time.  6. Diabetes: Ongoing management by primary care team. Not well controlled currently.  7. Chronic COPD: Followed by Dr. Luan Pulling.   Signed, Jory Sims DNP, ANP.   03/28/2017 8:23 AM   Attending note Patient seen and discussed with DNP Purcell Nails, I agree with her documentation above. 61 yo male history of ICM with prior stent to LAD in 2006 and most recent LVEF 20-25%, complete heart block with now with BiV AICD, afib/aflutter, admitted with SOB and acute on chronic systolic  HF and anemia with Hgb 7.7.   Echo: LVEF 20-25%, diffuse hypokinesis, apical akinesis, grade II diastolic dysfunction CXR pulmonary edema BNP 1200, Cr 1.15, K 4.9, trop neg EKG AV paced     Admitted with acute on chronic systolic HF. Negative 6.5 liters since admission, he is on lasix 40mg  IV bid starting this AM, had been on 80mg  IV bid. Uptrend in Cr and BUN. We will dose lasix 40mg  IVx 1 today, likely change to oral diuretics tomorrow vs holding diuretics tomorrow.  History of afib, coumadin is on hold in setting of significant anemia on admission. Some NSVT on monitor, he will have his device checked to see if significant ventricular arrhythmias may have played a role in his CHF exacerbation. Anemia workup per primary team and GI.   Carlyle Dolly MD

## 2017-03-28 NOTE — [place] Notes (Signed)
Inpatient Diabetes Program Recommendations  AACE/ADA: New Consensus Statement on Inpatient Glycemic Control (2015)  Target Ranges:  Prepandial:   less than 140 mg/dL      Peak postprandial:   less than 180 mg/dL (1-2 hours)      Critically ill patients:  140 - 180 mg/dL  Results for [name], [name] (MRN [id number]) as of 03/28/2017 08:01  Ref. Range 03/28/2017 06:12  Glucose Latest Ref Range: 65 - 99 mg/dL 209 (H)   Results for [name], [name] (MRN [id number]) as of 03/28/2017 08:01  Ref. Range 03/27/2017 07:50 03/27/2017 12:05 03/27/2017 16:17 03/27/2017 23:43  Glucose-Capillary Latest Ref Range: 65 - 99 mg/dL 228 (H) 210 (H) 152 (H) 308 (H)   Review of Glycemic Control  Diabetes history: DM2 Outpatient Diabetes medications: Lantus 20 units BID, Metformin 1000 mg BID Current orders for Inpatient glycemic control: Lantus 10 units QHS, Novolog 0-15 units TID with meals, Novolog 0-5 units QHS  Inpatient Diabetes Program Recommendations: Insulin - Basal: Please consider increasing Lantus to 14 units QHS.  Thanks, Barnie Alderman, RN, MSN, CDE Diabetes Coordinator Inpatient Diabetes Program [phone] (Team Pager from 8am to 5pm)

## 2017-03-28 NOTE — Plan of Care (Signed)
Problem: Food- and Nutrition-Related Knowledge Deficit (NB-1.1) Goal: Nutrition education Formal process to instruct or train a patient/client in a skill or to impart knowledge to help patients/clients voluntarily manage or modify food choices and eating behavior to maintain or improve health. Outcome: Adequate for Discharge Nutrition Education Note  RD consulted for nutrition education regarding acute CHF.  RD provided "Low Sodium Nutrition Therapy" handout from the Academy of [employer] Dietetics. Reviewed patient's dietary recall. Provided examples on ways to decrease sodium intake in diet. Discouraged intake of processed foods and use of salt shaker. Encouraged fresh fruits and vegetables as well as whole grain sources of carbohydrates to maximize fiber intake.   RD discussed why it is important for patient to adhere to diet recommendations, and emphasized the role of fluids, foods to avoid, and importance of weighing self daily. Teach back method used. [name] wife is not here but he is agreeable to give her the handout to read as well and she is to call RD with any questions.   Expect good compliance. The patients wife is the primary grocery shopper and does most of the food preparation. The patient uses a NO salt product for his foods but does eat out fast foods and they frequently consume [employer] at home which is high sodium item. We talked about eating smaller portions of this food and increasing/ adding a fresh vegetable to lower the overall sodium of the meal.  Body mass index is 23.94 kg/m. Pt meets criteria for normal based on current BMI.  Current diet order is CHO modified, patient is consuming approximately 75-100%% of meals at this time. Labs and medications reviewed. No further nutrition interventions warranted at this time. RD contact information provided. If additional nutrition issues arise, please re-consult RD.   Recommend pt be discharged on Low Sodium diet  restriction.  [name] Cater MS,RD,CSG,LDN Office: [phone] Pager: [phone]

## 2017-03-28 NOTE — Consult Note (Signed)
Referring Provider: Dr. Luan Pulling  Primary Care Physician:  Sinda Du, MD Primary Gastroenterologist:  Dr. Gala Romney   Date of Admission: 03/25/17 Date of Consultation: 03/28/17  Reason for Consultation:  Heme positive, IDA   HPI:  [name] is a 61 y.o. year old male with history of colon cancer in 2006, s/p subtotal colectomy. Mother with colon cancer after age of 65. Last surveillance via flex sig April 2016 with  normal-appearing residual rectum, surgical anastomosis, s/p subtotal colectomy. Surveillance in 5 years.   Hgb 7.7, with iron studies consistent with IDA. Heme positive. Sent to ED due to outpatient labs with Hgb 7.7. On Coumadin for afib. Received 2 units PRBCs with Hgb 11.3.   Had felt short of breath, saw cardiologist, sent to ED. Feels much better after blood transfusion. No further shortness of breath. No abdominal pain, N/V. Several days ago was nauseated but now resolved. No NSAIDs or aspirin powders. No overt GI bleeding. No dysphagia. Good appetite. No unexplained weight loss. Last dose of coumadin Thursday night. Has been on hold since admission.   Past Medical History:  Diagnosis Date  . Anemia, normocytic normochromic 2008   2008  . Arteriosclerotic cardiovascular disease (ASCVD)    Acute MI in 2006->  LAD stent, EF-37%  . Arthritis    Hx: of in hands  . Automatic implantable cardioverter-defibrillator in situ   . Carpal tunnel syndrome   . Colon cancer ([place]) 2006   Partial colectomy in 2006  . Complete heart block ([place])    S/P ICD 02/09/13  . COPD (chronic obstructive pulmonary disease) ([place])   . Coronary artery disease   . Diabetes mellitus, type II ([place])    With peripheral neuropathy  . Edema    venous insufficiency  . H/O alcohol dependence ([place])   . H/O hiatal hernia   . Hyperlipidemia   . Hypertension   . Myocardial infarction [facility])    Hx: of 2006  . Neuropathy in diabetes ([place])    Hx: of  . Pacemaker   . Pneumonia 01/2012   01/2012;  subsequent admission for chest pain  . Shortness of breath    Hx: of   . Tobacco abuse     Past Surgical History:  Procedure Laterality Date  . ANKLE SURGERY     Right  . Biventricular ICD Placement  02/09/2013   Dr. Lovena Le  . COLONOSCOPY  08/17/2005   Pancolonic diverticula/Multiple rectal polyps removed as described above. The larger of the two likely responsible for intermittent hematochezia/ Multiple polyps at the hepatic flexure resected with a snare./ Large polypoid lesion growing out of the base of the cecum, just behind the  ileocecal valve. This was not felt to be amendable to endoscopic resection. Biopsied multiple times  . CORONARY ANGIOPLASTY WITH STENT PLACEMENT    . FLEXIBLE SIGMOIDOSCOPY    06/13/2006   Essentially normal residual rectum status post total colectomy. anastomosis at 25cm.  Focal area of abnormality adjacent to suture, as described above, likely not significant, suspect granulation tissue, biopsied.  Anastomosis otherwise appeared normal.  Distal terminal ileum appeared normal  . FLEXIBLE SIGMOIDOSCOPY N/A 01/13/2015   Dr. Gala Romney: normal-appearing residual rectum, surgical anastomosis, s/p subtotal colectomy. Surveillance 5 years   . INGUINAL HERNIA REPAIR    . MULTIPLE EXTRACTIONS WITH ALVEOLOPLASTY N/A 04/09/2013   Procedure: Extraction of tooth #'s 1,2,4,5,6,7,8,9,10,11,12,13,17,18,20,21,22,23,27,28, 29,30,31, and 32 with alveoloplasty.;  Surgeon: Lenn Cal, DDS;  Location: [place];  Service: Oral Surgery;  Laterality: N/A;  .  PERMANENT PACEMAKER INSERTION N/A 02/09/2013   Procedure: PERMANENT PACEMAKER INSERTION;  Surgeon: Evans Lance, MD;  Location: [facility] CATH LAB;  Service: Cardiovascular;  Laterality: N/A;  . SUBTOTAL COLECTOMY  09/01/2005   subtotal colectomy  . TEMPORARY PACEMAKER INSERTION Right 02/09/2013   Procedure: TEMPORARY PACEMAKER INSERTION;  Surgeon: Sinclair Grooms, MD;  Location[facility] C[facility] LAB;  Service: Cardiovascular;  Laterality: Right;     Prior to Admission medications   Medication Sig Start Date End Date Taking? Authorizing Provider  acetaminophen (TYLENOL) 500 MG tablet Take 1 tablet by mouth as needed. 01/08/14  Yes [provider]  albuterol (PROVENTIL HFA;VENTOLIN HFA) 108 (90 BASE) MCG/ACT inhaler Inhale 2 puffs into the lungs every 6 (six) hours as needed. For shortness of breath   Yes [provider]  albuterol (PROVENTIL) (2.5 MG/3ML) 0.083% nebulizer solution Take 2.5 mg by nebulization every 6 (six) hours as needed. For shortness of breath   Yes [provider]  aspirin EC 81 MG tablet Take 1 tablet by mouth daily. 01/08/14  Yes [provider]  carvedilol (COREG) 6.25 MG tablet Take 6.25 mg by mouth 2 (two) times daily with a meal.  05/04/12  Yes [provider]  furosemide (LASIX) 40 MG tablet Take 1 tablet (40 mg total) by mouth 2 (two) times daily. Patient taking differently: Take 20-40 mg by mouth See admin instructions. 40mg  in the morning and 20mg  in the evening 03/25/17 06/23/17 Yes Lendon Colonel, NP  gabapentin (NEURONTIN) 300 MG capsule Take 300 mg by mouth at bedtime.   Yes [provider]  insulin glargine (LANTUS) 100 UNIT/ML injection Inject 20 Units into the skin 2 (two) times daily.    Yes [provider]  lisinopril (PRINIVIL,ZESTRIL) 10 MG tablet Take 1 tablet by mouth daily. 03/08/17  Yes [provider]  loratadine (CLARITIN) 10 MG tablet Take 10 mg by mouth daily as needed for allergies. As needed 01/21/15  Yes [provider]  magnesium oxide (MAG-OX) 400 MG tablet Take 400 mg by mouth 2 (two) times daily.   Yes [provider]  metFORMIN (GLUCOPHAGE) 500 MG tablet Take 1,000 mg by mouth 2 (two) times daily with a meal.   Yes [provider]  metolazone (ZAROXOLYN) 2.5 MG tablet Take 2.5 mg on Sunday and Wednesday ONLY Patient taking differently: Take 2.5 mg by mouth 2 (two) times a week. Take 2.5 mg on  Sunday and Wednesday ONLY 08/30/16  Yes Lendon Colonel, NP  Multiple Vitamin (MULTIVITAMIN) tablet Take 1 tablet by mouth daily.   Yes [provider]  nitroGLYCERIN (NITROSTAT) 0.4 MG SL tablet Place 0.4 mg under the tongue every 5 (five) minutes x 3 doses as needed. For chest pain   Yes [provider]  ondansetron (ZOFRAN) 4 MG tablet Take 4 mg by mouth 4 (four) times daily as needed for nausea or vomiting.   Yes [provider]  sertraline (ZOLOFT) 50 MG tablet Take 50 mg by mouth daily.   Yes [provider]  simvastatin (ZOCOR) 80 MG tablet Take 40 mg by mouth at bedtime.   Yes [provider]  tiZANidine (ZANAFLEX) 4 MG tablet Take 4 mg by mouth 3 (three) times daily.   Yes [provider]  warfarin (COUMADIN) 4 MG tablet Take 2 tablets daily except 3 tablets on Wednesdays Patient taking differently: Take 8-12 mg by mouth See admin instructions. Take 2 tablets daily except 3 tablets on Wednesdays 03/02/17  Yes Evans Lance, MD  potassium chloride SA (K-DUR,KLOR-CON) 20 MEQ tablet Take 1 tablet (20 mEq total) by mouth daily. 03/25/17   Lendon Colonel, NP  traMADol (ULTRAM) 50 MG tablet Take 50-100 mg by mouth every 4 (four) hours as needed.  11/08/14   [provider]    Current Facility-Administered Medications  Medication Dose Route Frequency Provider Last Rate Last Dose  . 0.9 %  sodium chloride infusion   Intravenous Once Orvan Falconer, MD      . acetaminophen (TYLENOL) tablet 500 mg  500 mg Oral PRN Orvan Falconer, MD      . albuterol (PROVENTIL) (2.5 MG/3ML) 0.083% nebulizer solution 2.5 mg  2.5 mg Nebulization BID Sinda Du, MD      . atorvastatin (LIPITOR) tablet 40 mg  40 mg Oral q1800 Orvan Falconer, MD   40 mg at 03/27/17 1714  . carvedilol (COREG) tablet 6.25 mg  6.25 mg Oral BID WC Orvan Falconer, MD   6.25 mg at 03/28/17 6314  . furosemide (LASIX) injection 40 mg  40 mg Intravenous Q12H Lendon Colonel, NP   40 mg  at 03/28/17 1045  . gabapentin (NEURONTIN) capsule 300 mg  300 mg Oral QHS Orvan Falconer, MD   300 mg at 03/27/17 2124  . insulin aspart (novoLOG) injection 0-15 Units  0-15 Units Subcutaneous TID WC Orvan Falconer, MD   5 Units at 03/28/17 [phone]  . insulin aspart (novoLOG) injection 0-5 Units  0-5 Units Subcutaneous QHS Orvan Falconer, MD   4 Units at 03/27/17 2345  . insulin glargine (LANTUS) injection 14 Units  14 Units Subcutaneous QHS Sinda Du, MD      . lisinopril (PRINIVIL,ZESTRIL) tablet 10 mg  10 mg Oral Daily Orvan Falconer, MD   10 mg at 03/28/17 6378  . loratadine (CLARITIN) tablet 10 mg  10 mg Oral Daily PRN Orvan Falconer, MD      . metolazone (ZAROXOLYN) tablet 2.5 mg  2.5 mg Oral Once per day on Sun Wed Le, Peter, MD      . ondansetron [facility]) injection 4 mg  4 mg Intravenous Q6H PRN Sinda Du, MD      . ondansetron [facility]) tablet 4 mg  4 mg Oral Q6H PRN Sinda Du, MD      . pantoprazole (PROTONIX) injection 40 mg  40 mg Intravenous Q12H Orvan Falconer, MD   40 mg at 03/28/17 0929  . potassium chloride SA (K-DUR,KLOR-CON) CR tablet 40 mEq  40 mEq Oral BID Sinda Du, MD   40 mEq at 03/28/17 5885  . sertraline (ZOLOFT) tablet 50 mg  50 mg Oral Daily Orvan Falconer, MD   50 mg at 03/28/17 0277  . traMADol (ULTRAM) tablet 50-100 mg  50-100 mg Oral Q6H PRN Orvan Falconer, MD   100 mg at 03/26/17 1708    Allergies as of 03/25/2017 - Review Complete 03/25/2017  Allergen Reaction Noted  . Vancomycin Itching and Rash 01/15/2012    Family History  Problem Relation Age of Onset  . Colon cancer Mother        Tillmon Kisling, ?>age 80.  [name] Heart disease Mother   . Heart disease Father   . Hypertension Brother   . Heart disease Brother   . Diabetes Brother   . Liver disease Neg Hx     Social History   Social History  . Marital status: Married    Spouse name: N/A  . Number of children: 1  . Years of  education: N/A   Occupational History  . Parts Manager     Disabled   Social History Main  Topics  . Smoking status: Current Every Day Smoker    Packs/day: 1.00    Years: 40.00    Types: Cigarettes    Start date: 07/13/1971  . Smokeless tobacco: Never Used  . Alcohol use No     Comment: Quit 2005 from a 6 pack a day use  . Drug use: No  . Sexual activity: No   Other Topics Concern  . Not on file   Social History Narrative  . No narrative on file    Review of Systems: As mentioned in HPI   Physical Exam: Vital signs in last 24 hours: Temp:  [97.5 F (36.4 C)-97.8 F (36.6 C)] 97.7 F (36.5 C) (06/25 0600) Pulse Rate:  [62-75] 71 (06/25 0600) Resp:  [19-20] 20 (06/25 0600) BP: (118-131)/(70-71) 118/70 (06/25 0600) SpO2:  [87 %-98 %] 95 % (06/25 0821) Weight:  [176 lb 8 oz (80.1 kg)] 176 lb 8 oz (80.1 kg) (06/25 4403) Last BM Date: 03/28/17 General:   Alert,  Well-developed, well-nourished, pleasant and cooperative in NAD Head:  Normocephalic and atraumatic. Eyes:  Sclera clear, no icterus.   Conjunctiva pink. Ears:  Normal auditory acuity. Nose:  No deformity, discharge,  or lesions. Mouth:  No deformity or lesions, dentition normal. Lungs:  Clear throughout to auscultation, mild expiratory wheeze posteriorly Heart:  Regular rate and rhythm; mild systolic murmur Abdomen:  Soft, nontender and nondistended. No masses, hepatosplenomegaly or hernias noted. Normal bowel sounds, without guarding, and without rebound.   Rectal:  Deferred until time of colonoscopy.   Msk:  Symmetrical without gross deformities. Normal posture. Extremities:  Without  edema. Neurologic:  Alert and  oriented x4 Psych:  Alert and cooperative. Normal mood and affect.  Intake/Output from previous day: 06/24 0701 - 06/25 0700 In: 1460 [P.O.:1460] Out: 2680 [Urine:2680] Intake/Output this shift: Total I/O In: -  Out: 650 [Urine:650]  Lab Results:  Recent Labs  03/26/17 2203 03/27/17 1018 03/28/17 0612  WBC 5.0 5.2 5.9  HGB 10.9* 10.9* 11.3*  HCT 34.1* 35.4* 36.6*  PLT 213  228 215   Lab Results  Component Value Date   IRON 31 (L) 03/25/2017   TIBC 468 (H) 03/25/2017   FERRITIN 18 (L) 03/25/2017    BMET  Recent Labs  03/26/17 0630 03/27/17 0600 03/28/17 0612  NA 140 137 137  K 3.7 3.6 4.3  CL 100* 94* 95*  CO2 32 33* 32  GLUCOSE 105* 257* 209*  BUN 16 17 27*  CREATININE 1.21 1.33* 1.53*  CALCIUM 9.1 9.0 9.0   PT/INR  Recent Labs  03/25/17 1842  LABPROT 22.5*  INR 1.95    Studies/Results:  Impression: 61 year old male with history of colon cancer in 2006 s/p subtotal colectomy, presenting with acute blood loss anemia, heme positive stool, evidence of IDA. Last flex sig for surveillance was in 2016 and normal. No evidence of overt GI bleeding. Hgb improved with 2 units PRBCs, going from 7 range to 11. Symptomatically improved. Followed by cardiology for extensive history to include acute on chronic systolic heart failure this admission, AICD, LVEF 20-25%, and chronic Coumadin. This has been on hold since admission. Will check INR today and plan tentatively for flex sig +/- EGD with Dr. Oneida Alar on 6/26. Risks and benefits discussed in detail with stated understanding.   Plan: NPO after midnight Check INR now Tentative Flex sig +/-  EGD with Dr. Oneida Alar on 6/26 Coumadin remains on hold   Annitta Needs, PhD, ANP-BC [facility]     LOS: 3 days    03/28/2017, 10:14 AM

## 2017-03-28 NOTE — Progress Notes (Signed)
Subjective: He says he feels better. He has no new complaints. His breathing is doing okay. He is less swollen. He has not noticed any blood in his stool but he has coughed up some blood-tinged sputum. His blood sugar is still up. He's not having any chest pain. No PND or orthopnea  Objective: Vital signs in last 24 hours: Temp:  [97.5 F (36.4 C)-97.8 F (36.6 C)] 97.7 F (36.5 C) (06/25 0600) Pulse Rate:  [62-75] 71 (06/25 0600) Resp:  [19-20] 20 (06/25 0600) BP: (118-131)/(70-71) 118/70 (06/25 0600) SpO2:  [87 %-98 %] 95 % (06/25 0821) Weight:  [80.1 kg (176 lb 8 oz)] 80.1 kg (176 lb 8 oz) (06/25 6195) Weight change: 0.363 kg (12.8 oz)    Intake/Output from previous day: 06/24 0701 - 06/25 0700 In: 1460 [P.O.:1460] Out: 2680 [Urine:2680]  PHYSICAL EXAM General appearance: alert, cooperative and no distress Resp: clear to auscultation bilaterally Cardio: irregularly irregular rhythm GI: soft, non-tender; bowel sounds normal; no masses,  no organomegaly Extremities: venous stasis dermatitis noted Skin warm and dry.  Lab Results:  Results for orders placed or performed during the hospital encounter of 03/25/17 (from the past 48 hour(s))  Glucose, capillary     Status: Abnormal   Collection Time: 03/26/17 12:00 PM  Result Value Ref Range   Glucose-Capillary 238 (H) 65 - 99 mg/dL  Glucose, capillary     Status: Abnormal   Collection Time: 03/26/17  5:25 PM  Result Value Ref Range   Glucose-Capillary 241 (H) 65 - 99 mg/dL  Glucose, capillary     Status: Abnormal   Collection Time: 03/26/17  9:08 PM  Result Value Ref Range   Glucose-Capillary 210 (H) 65 - 99 mg/dL  CBC     Status: Abnormal   Collection Time: 03/26/17 10:03 PM  Result Value Ref Range   WBC 5.0 4.0 - 10.5 K/uL   RBC 4.33 4.22 - 5.81 MIL/uL   Hemoglobin 10.9 (L) 13.0 - 17.0 g/dL    Comment: POST TRANSFUSION SPECIMEN   HCT 34.1 (L) 39.0 - 52.0 %   MCV 78.8 78.0 - 100.0 fL   MCH 25.2 (L) 26.0 - 34.0 pg   MCHC 32.0 30.0 - 36.0 g/dL   RDW 17.3 (H) 11.5 - 15.5 %   Platelets 213 150 - 400 K/uL  Basic metabolic panel     Status: Abnormal   Collection Time: 03/27/17  6:00 AM  Result Value Ref Range   Sodium 137 135 - 145 mmol/L   Potassium 3.6 3.5 - 5.1 mmol/L   Chloride 94 (L) 101 - 111 mmol/L   CO2 33 (H) 22 - 32 mmol/L   Glucose, Bld 257 (H) 65 - 99 mg/dL   BUN 17 6 - 20 mg/dL   Creatinine, Ser 1.33 (H) 0.61 - 1.24 mg/dL   Calcium 9.0 8.9 - 10.3 mg/dL   GFR calc non Af Amer 57 (L) >60 mL/min   GFR calc Af Amer >60 >60 mL/min    Comment: (NOTE) The eGFR has been calculated using the CKD EPI equation. This calculation has not been validated in all clinical situations. eGFR's persistently <60 mL/min signify possible Chronic Kidney Disease.    Anion gap 10 5 - 15  Glucose, capillary     Status: Abnormal   Collection Time: 03/27/17  7:50 AM  Result Value Ref Range   Glucose-Capillary 228 (H) 65 - 99 mg/dL  CBC     Status: Abnormal   Collection Time: 03/27/17 10:18 AM  Result Value Ref Range   WBC 5.2 4.0 - 10.5 K/uL   RBC 4.48 4.22 - 5.81 MIL/uL   Hemoglobin 10.9 (L) 13.0 - 17.0 g/dL   HCT 35.4 (L) 39.0 - 52.0 %   MCV 79.0 78.0 - 100.0 fL   MCH 24.3 (L) 26.0 - 34.0 pg   MCHC 30.8 30.0 - 36.0 g/dL   RDW 17.3 (H) 11.5 - 15.5 %   Platelets 228 150 - 400 K/uL    Comment: SPECIMEN CHECKED FOR CLOTS PLATELET COUNT CONFIRMED BY SMEAR   Glucose, capillary     Status: Abnormal   Collection Time: 03/27/17 12:05 PM  Result Value Ref Range   Glucose-Capillary 210 (H) 65 - 99 mg/dL  Glucose, capillary     Status: Abnormal   Collection Time: 03/27/17  4:17 PM  Result Value Ref Range   Glucose-Capillary 152 (H) 65 - 99 mg/dL  Glucose, capillary     Status: Abnormal   Collection Time: 03/27/17 11:43 PM  Result Value Ref Range   Glucose-Capillary 308 (H) 65 - 99 mg/dL   Comment 1 Notify RN    Comment 2 Document in Chart   Basic metabolic panel     Status: Abnormal   Collection Time:  03/28/17  6:12 AM  Result Value Ref Range   Sodium 137 135 - 145 mmol/L   Potassium 4.3 3.5 - 5.1 mmol/L   Chloride 95 (L) 101 - 111 mmol/L   CO2 32 22 - 32 mmol/L   Glucose, Bld 209 (H) 65 - 99 mg/dL   BUN 27 (H) 6 - 20 mg/dL   Creatinine, Ser 1.53 (H) 0.61 - 1.24 mg/dL   Calcium 9.0 8.9 - 10.3 mg/dL   GFR calc non Af Amer 48 (L) >60 mL/min   GFR calc Af Amer 55 (L) >60 mL/min    Comment: (NOTE) The eGFR has been calculated using the CKD EPI equation. This calculation has not been validated in all clinical situations. eGFR's persistently <60 mL/min signify possible Chronic Kidney Disease.    Anion gap 10 5 - 15  CBC with Differential/Platelet     Status: Abnormal   Collection Time: 03/28/17  6:12 AM  Result Value Ref Range   WBC 5.9 4.0 - 10.5 K/uL   RBC 4.62 4.22 - 5.81 MIL/uL   Hemoglobin 11.3 (L) 13.0 - 17.0 g/dL   HCT 36.6 (L) 39.0 - 52.0 %   MCV 79.2 78.0 - 100.0 fL   MCH 24.5 (L) 26.0 - 34.0 pg   MCHC 30.9 30.0 - 36.0 g/dL   RDW 17.7 (H) 11.5 - 15.5 %   Platelets 215 150 - 400 K/uL    Comment: PLATELET COUNT CONFIRMED BY SMEAR SPECIMEN CHECKED FOR CLOTS    Neutrophils Relative % 65 %   Neutro Abs 3.8 1.7 - 7.7 K/uL   Lymphocytes Relative 19 %   Lymphs Abs 1.1 0.7 - 4.0 K/uL   Monocytes Relative 11 %   Monocytes Absolute 0.7 0.1 - 1.0 K/uL   Eosinophils Relative 3 %   Eosinophils Absolute 0.2 0.0 - 0.7 K/uL   Basophils Relative 1 %   Basophils Absolute 0.1 0.0 - 0.1 K/uL  Glucose, capillary     Status: Abnormal   Collection Time: 03/28/17  8:07 AM  Result Value Ref Range   Glucose-Capillary 224 (H) 65 - 99 mg/dL   Comment 1 Notify RN     ABGS No results for input(s): PHART, PO2ART, TCO2, HCO3 in the last  72 hours.  Invalid input(s): PCO2 CULTURES No results found for this or any previous visit (from the past 240 hour(s)). Studies/Results: No results found.  Medications:  Prior to Admission:  Prescriptions Prior to Admission  Medication Sig Dispense  Refill Last Dose  . acetaminophen (TYLENOL) 500 MG tablet Take 1 tablet by mouth as needed.   unknown  . albuterol (PROVENTIL HFA;VENTOLIN HFA) 108 (90 BASE) MCG/ACT inhaler Inhale 2 puffs into the lungs every 6 (six) hours as needed. For shortness of breath   unknown  . albuterol (PROVENTIL) (2.5 MG/3ML) 0.083% nebulizer solution Take 2.5 mg by nebulization every 6 (six) hours as needed. For shortness of breath   Past Week at Unknown time  . aspirin EC 81 MG tablet Take 1 tablet by mouth daily.   03/25/2017 at Unknown time  . carvedilol (COREG) 6.25 MG tablet Take 6.25 mg by mouth 2 (two) times daily with a meal.    03/25/2017 at 1330  . furosemide (LASIX) 40 MG tablet Take 1 tablet (40 mg total) by mouth 2 (two) times daily. (Patient taking differently: Take 20-40 mg by mouth See admin instructions. 77m in the morning and 21min the evening) 180 tablet 3 03/25/2017 at Unknown time  . gabapentin (NEURONTIN) 300 MG capsule Take 300 mg by mouth at bedtime.   03/24/2017 at Unknown time  . insulin glargine (LANTUS) 100 UNIT/ML injection Inject 20 Units into the skin 2 (two) times daily.    03/25/2017 at Unknown time  . lisinopril (PRINIVIL,ZESTRIL) 10 MG tablet Take 1 tablet by mouth daily.   03/25/2017 at Unknown time  . loratadine (CLARITIN) 10 MG tablet Take 10 mg by mouth daily as needed for allergies. As needed   Past Week at Unknown time  . magnesium oxide (MAG-OX) 400 MG tablet Take 400 mg by mouth 2 (two) times daily.   03/25/2017 at Unknown time  . metFORMIN (GLUCOPHAGE) 500 MG tablet Take 1,000 mg by mouth 2 (two) times daily with a meal.   03/25/2017 at Unknown time  . metolazone (ZAROXOLYN) 2.5 MG tablet Take 2.5 mg on Sunday and Wednesday ONLY (Patient taking differently: Take 2.5 mg by mouth 2 (two) times a week. Take 2.5 mg on Sunday and Wednesday ONLY) 24 tablet 3 03/23/2017 at Unknown time  . Multiple Vitamin (MULTIVITAMIN) tablet Take 1 tablet by mouth daily.   03/25/2017 at Unknown time  .  nitroGLYCERIN (NITROSTAT) 0.4 MG SL tablet Place 0.4 mg under the tongue every 5 (five) minutes x 3 doses as needed. For chest pain   unknown  . ondansetron (ZOFRAN) 4 MG tablet Take 4 mg by mouth 4 (four) times daily as needed for nausea or vomiting.   unknown  . sertraline (ZOLOFT) 50 MG tablet Take 50 mg by mouth daily.   03/25/2017 at Unknown time  . simvastatin (ZOCOR) 80 MG tablet Take 40 mg by mouth at bedtime.   03/24/2017 at Unknown time  . tiZANidine (ZANAFLEX) 4 MG tablet Take 4 mg by mouth 3 (three) times daily.   03/25/2017 at Unknown time  . warfarin (COUMADIN) 4 MG tablet Take 2 tablets daily except 3 tablets on Wednesdays (Patient taking differently: Take 8-12 mg by mouth See admin instructions. Take 2 tablets daily except 3 tablets on Wednesdays) 163 tablet 3 03/24/2017 at 2200  . potassium chloride SA (K-DUR,KLOR-CON) 20 MEQ tablet Take 1 tablet (20 mEq total) by mouth daily. 90 tablet 3   . traMADol (ULTRAM) 50 MG tablet Take 50-100  mg by mouth every 4 (four) hours as needed.    unknown   Scheduled: . albuterol  2.5 mg Nebulization BID  . atorvastatin  40 mg Oral q1800  . carvedilol  6.25 mg Oral BID WC  . furosemide  80 mg Intravenous Q12H  . gabapentin  300 mg Oral QHS  . insulin aspart  0-15 Units Subcutaneous TID WC  . insulin aspart  0-5 Units Subcutaneous QHS  . insulin glargine  10 Units Subcutaneous QHS  . lisinopril  10 mg Oral Daily  . metolazone  2.5 mg Oral Once per day on Sun Wed  . pantoprazole (PROTONIX) IV  40 mg Intravenous Q12H  . potassium chloride SA  40 mEq Oral BID  . sertraline  50 mg Oral Daily   Continuous: . sodium chloride     [id number], loratadine, ondansetron (ZOFRAN) IV, ondansetron, traMADol  Assesment: He was admitted with acute on chronic combined systolic and diastolic heart failure. He is much better. He was anemic and I think that may have been what tipped him over to developing increasing problems with his heart failure. At  baseline he is known to have coronary disease which is stable with no chest pain despite having been anemic. He has a implantable cardioverter/defibrillator and has had some runs of V. tach but he has not had this fire. He has COPD at baseline which I think is pretty stable. He has diabetes and his blood sugar is not controlled yet so I'm going to increase his Lantus. Continue other medications. Principal Problem:   Acute on chronic combined systolic and diastolic CHF (congestive heart failure) (HCC) Active Problems:   COPD (chronic obstructive pulmonary disease) (HCC)   Arteriosclerotic cardiovascular disease (ASCVD)   Diabetes mellitus, type II (HCC)   Hypertension   Hyperlipidemia   Tobacco abuse   Anemia, normocytic normochromic   Biventricular implantable cardioverter-defibrillator in situ   Atrial fibrillation (HCC)   History of colon cancer    Plan: Cardiology consultation is underway. I have requested GI consultation    LOS: 3 days   Syriana Croslin L 03/28/2017, 8:42 AM

## 2017-03-28 NOTE — Care Management Note (Signed)
Case Management Note  Patient Details  Name: [name] MRN: [id number] Date of Birth: 07/31/1956  Subjective/Objective:                  Admitted with CHF. He is from home, lives with wife. He is ind with his ADL's, he has a cane he uses as needed. He uses [facility] [employer] for medications. He reports compliance with medications. He has cont oxygen at home pta but does not use it regularly. He has a neb machine. He has a scale. He reports being non-compliant with daily weights and his diet. Pt communicates no concerns or nedds.  Pt on the [place] registry.   Action/Plan: Anticipate DC home with self care. Pt will be referred for enrollment in [facility] Transition calls for CHF. Pt referred for dietician consult. CM will follow to DC.   Expected Discharge Date:  03/28/17               Expected Discharge Plan:  Home/Self Care  In-House Referral:  NA, Nutrition  Discharge planning Services  CM Consult  Post Acute Care Choice:  NA Choice offered to:  NA  Status of Service:  In process, will continue to follow   Sherald Barge, RN 03/28/2017, 11:05 AM

## 2017-03-29 ENCOUNTER — Encounter (HOSPITAL_COMMUNITY)
Admission: EM | Disposition: A | Discharge status: Home / Self Care | Payer: Self-pay | Source: Home / Self Care | Attending: Pulmonary Disease

## 2017-03-29 ENCOUNTER — Encounter (HOSPITAL_COMMUNITY): Payer: Self-pay

## 2017-03-29 DIAGNOSIS — K2970 Gastritis, unspecified, without bleeding: Secondary | ICD-10-CM

## 2017-03-29 DIAGNOSIS — D509 Iron deficiency anemia, unspecified: Secondary | ICD-10-CM

## 2017-03-29 DIAGNOSIS — K920 Hematemesis: Secondary | ICD-10-CM

## 2017-03-29 DIAGNOSIS — K633 Ulcer of intestine: Principal | ICD-10-CM

## 2017-03-29 DIAGNOSIS — K228 Other specified diseases of esophagus: Secondary | ICD-10-CM

## 2017-03-29 DIAGNOSIS — K621 Rectal polyp: Secondary | ICD-10-CM

## 2017-03-29 DIAGNOSIS — K2980 Duodenitis without bleeding: Secondary | ICD-10-CM

## 2017-03-29 HISTORY — PX: FLEXIBLE SIGMOIDOSCOPY: SHX5431

## 2017-03-29 HISTORY — PX: ESOPHAGOGASTRODUODENOSCOPY: SHX5428

## 2017-03-29 LAB — BASIC METABOLIC PANEL
Anion gap: 7 (ref 5–15)
BUN: 30 mg/dL — AB (ref 6–20)
CHLORIDE: 95 mmol/L — AB (ref 101–111)
CO2: 32 mmol/L (ref 22–32)
CREATININE: 1.49 mg/dL — AB (ref 0.61–1.24)
Calcium: 8.7 mg/dL — ABNORMAL LOW (ref 8.9–10.3)
GFR calc Af Amer: 57 mL/min — ABNORMAL LOW (ref >60)
GFR calc non Af Amer: 49 mL/min — ABNORMAL LOW (ref >60)
GLUCOSE: 103 mg/dL — AB (ref 65–99)
Potassium: 4.1 mmol/L (ref 3.5–5.1)
Sodium: 134 mmol/L — ABNORMAL LOW (ref 135–145)

## 2017-03-29 LAB — GLUCOSE, CAPILLARY
GLUCOSE-CAPILLARY: 146 mg/dL — AB (ref 65–99)
Glucose-Capillary: 86 mg/dL (ref 65–99)
Glucose-Capillary: 233 mg/dL — ABNORMAL HIGH (ref 65–99)
Glucose-Capillary: 86 mg/dL (ref 65–99)

## 2017-03-29 SURGERY — EGD (ESOPHAGOGASTRODUODENOSCOPY)
Anesthesia: Moderate Sedation

## 2017-03-29 MED ORDER — LIDOCAINE VISCOUS 2 % MT SOLN
OROMUCOSAL | Status: AC
  Filled 2017-03-29: qty 15

## 2017-03-29 MED ORDER — MEPERIDINE HCL 100 MG/ML IJ SOLN
INTRAMUSCULAR | Status: AC
  Filled 2017-03-29: qty 2

## 2017-03-29 MED ORDER — PROMETHAZINE HCL 25 MG/ML IJ SOLN
INTRAMUSCULAR | Status: DC | PRN
  Administered 2017-03-29: 12.5 mg via INTRAVENOUS

## 2017-03-29 MED ORDER — MEPERIDINE HCL 100 MG/ML IJ SOLN
INTRAMUSCULAR | Status: DC | PRN
  Administered 2017-03-29 (×2): 50 mg via INTRAVENOUS

## 2017-03-29 MED ORDER — SODIUM CHLORIDE 0.9 % IV SOLN
INTRAVENOUS | Status: DC
  Administered 2017-03-29 (×2): via INTRAVENOUS

## 2017-03-29 MED ORDER — PROMETHAZINE HCL 25 MG/ML IJ SOLN: 12.5000 mg | INTRAMUSCULAR | Status: AC

## 2017-03-29 MED ORDER — MIDAZOLAM HCL 5 MG/5ML IJ SOLN
INTRAMUSCULAR | Status: DC | PRN
  Administered 2017-03-29 (×2): 2 mg via INTRAVENOUS

## 2017-03-29 MED ORDER — MIDAZOLAM HCL 5 MG/5ML IJ SOLN
INTRAMUSCULAR | Status: AC
  Filled 2017-03-29: qty 10

## 2017-03-29 MED ORDER — PROMETHAZINE HCL 25 MG/ML IJ SOLN
INTRAMUSCULAR | Status: AC
  Filled 2017-03-29: qty 1

## 2017-03-29 NOTE — Progress Notes (Signed)
Progress Note  Patient Name: [name] Date of Encounter: 03/29/2017    Subjective   No SOB  Inpatient Medications    Scheduled Meds: . albuterol  2.5 mg Nebulization BID  . atorvastatin  40 mg Oral q1800  . carvedilol  6.25 mg Oral BID WC  . gabapentin  300 mg Oral QHS  . insulin aspart  0-15 Units Subcutaneous TID WC  . insulin aspart  0-5 Units Subcutaneous QHS  . insulin glargine  7 Units Subcutaneous QHS  . lisinopril  10 mg Oral Daily  . metolazone  2.5 mg Oral Once per day on Sun Wed  . pantoprazole  40 mg Oral QAC breakfast  . potassium chloride SA  40 mEq Oral BID  . promethazine  12.5 mg Intravenous On Call  . sertraline  50 mg Oral Daily   Continuous Infusions: . sodium chloride    . sodium chloride     PRN Meds: acetaminophen, loratadine, ondansetron (ZOFRAN) IV, ondansetron, traMADol   Vital Signs    Vitals:   03/28/17 1922 03/28/17 2119 03/29/17 0435 03/29/17 0845  BP:  128/71 (!) 118/57   Pulse:  62 67   Resp:  20 18   Temp:  98.4 F (36.9 C) 97.7 F (36.5 C)   TempSrc:  Oral Oral   SpO2: 97% 93% 93% (!) 89%  Weight:   179 lb 4.8 oz (81.3 kg)   Height:        Intake/Output Summary (Last 24 hours) at 03/29/17 0912 Last data filed at 03/29/17 0815  Gross per 24 hour  Intake              440 ml  Output             2500 ml  Net            -2060 ml   Filed Weights   03/27/17 0634 03/28/17 0632 03/29/17 0435  Weight: 175 lb 11.2 oz (79.7 kg) 176 lb 8 oz (80.1 kg) 179 lb 4.8 oz (81.3 kg)    Telemetry  AV paced  ECG  n/a  Physical Exam   GEN: No acute distress.   Neck: No JVD Cardiac: RRR, no murmurs, rubs, or gallops.  Respiratory: Clear to auscultation bilaterally. GI: Soft, nontender, non-distended  MS: No edema; No deformity. Neuro:  Nonfocal  Psych: Normal affect   Labs    Chemistry Recent Labs Lab 03/27/17 0600 03/28/17 0612 03/29/17 0548  NA 137 137 134*  K 3.6 4.3 4.1  CL 94* 95* 95*  CO2 33* 32 32    GLUCOSE 257* 209* 103*  BUN 17 27* 30*  CREATININE 1.33* 1.53* 1.49*  CALCIUM 9.0 9.0 8.7*  GFRNONAA 57* 48* 49*  GFRAA >60 55* 57*  ANIONGAP 10 10 7      Hematology Recent Labs Lab 03/26/17 2203 03/27/17 1018 03/28/17 0612  WBC 5.0 5.2 5.9  RBC 4.33 4.48 4.62  HGB 10.9* 10.9* 11.3*  HCT 34.1* 35.4* 36.6*  MCV 78.8 79.0 79.2  MCH 25.2* 24.3* 24.5*  MCHC 32.0 30.8 30.9  RDW 17.3* 17.3* 17.7*  PLT 213 228 215    Cardiac EnzymesNo results for input(s): TROPONINI in the last 168 hours.  Recent Labs Lab 03/25/17 1941  TROPIPOC 0.01     BNP Recent Labs Lab 03/25/17 1534  BNP 1,210.0*     DDimer No results for input(s): DDIMER in the last 168 hours.   Radiology    No results found.  Cardiac Studies    Patient Profile     [name] is a 61 y.o. male with a hx of Atrial fibrillation, CAD, ischemic cardiomyopathy ICD in situ, chronic conbined CHF, who is being seen today for the evaluation of  decompensated CHF, atrial fibrillation,at the request of Dr. Luan Pulling.   Assessment & Plan    1. Acute on chronic systolic HF - negative 7.9 liters since admission, mild uptrend in Cr and BUN that looks to have stabilized.  - device check without significant arrhythmias - baseline Cr around 1-1.1, hold diuretics today. Likely start oral tomorrow - continue coreg, lisinopril  - appears euvolemic. Hold diuretics today, likely start orals tomorrow.   2. Anemia - per GI and primary team, looks to be set up for EGD/colonoscopy  3. Afib - coumadin on hold in setting of anemia, f/u GI workup prior to restarting.   No contraindication for endoscopy from cardiac standpoint    Signed, Carlyle Dolly, MD  03/29/2017, 9:12 AM

## 2017-03-29 NOTE — Progress Notes (Signed)
KVO Fluids started and Tap Water enema performed. Will continue to monitor.

## 2017-03-29 NOTE — H&P (Signed)
Primary Care Physician:  Sinda Du, MD Primary Gastroenterologist:  Dr. Oneida Alar  Pre-Procedure History & Physical: HPI:  [name] is a 61 y.o. male here for  ANEMIA/BRBPR/HEMATEMESIS.  Past Medical History:  Diagnosis Date  . Anemia, normocytic normochromic 2008   2008  . Arteriosclerotic cardiovascular disease (ASCVD)    Acute MI in 2006->  LAD stent, EF-37%  . Arthritis    Hx: of in hands  . Automatic implantable cardioverter-defibrillator in situ   . Carpal tunnel syndrome   . Colon cancer ([place]) 2006   Partial colectomy in 2006  . Complete heart block ([place])    S/P ICD 02/09/13  . COPD (chronic obstructive pulmonary disease) ([place])   . Coronary artery disease   . Diabetes mellitus, type II ([place])    With peripheral neuropathy  . Edema    venous insufficiency  . H/O alcohol dependence ([place])   . H/O hiatal hernia   . Hyperlipidemia   . Hypertension   . Myocardial infarction [facility])    Hx: of 2006  . Neuropathy in diabetes ([place])    Hx: of  . Pacemaker   . Pneumonia 01/2012   01/2012; subsequent admission for chest pain  . Shortness of breath    Hx: of   . Tobacco abuse     Past Surgical History:  Procedure Laterality Date  . ANKLE SURGERY     Right  . Biventricular ICD Placement  02/09/2013   Dr. Lovena Le  . COLONOSCOPY  08/17/2005   Pancolonic diverticula/Multiple rectal polyps removed as described above. The larger of the two likely responsible for intermittent hematochezia/ Multiple polyps at the hepatic flexure resected with a snare./ Large polypoid lesion growing out of the base of the cecum, just behind the  ileocecal valve. This was not felt to be amendable to endoscopic resection. Biopsied multiple times  . CORONARY ANGIOPLASTY WITH STENT PLACEMENT    . FLEXIBLE SIGMOIDOSCOPY    06/13/2006   Essentially normal residual rectum status post total colectomy. anastomosis at 25cm.  Focal area of abnormality adjacent to suture, as described above, likely not  significant, suspect granulation tissue, biopsied.  Anastomosis otherwise appeared normal.  Distal terminal ileum appeared normal  . FLEXIBLE SIGMOIDOSCOPY N/A 01/13/2015   Dr. Gala Romney: normal-appearing residual rectum, surgical anastomosis, s/p subtotal colectomy. Surveillance 5 years   . INGUINAL HERNIA REPAIR    . MULTIPLE EXTRACTIONS WITH ALVEOLOPLASTY N/A 04/09/2013   Procedure: Extraction of tooth #'s 1,2,4,5,6,7,8,9,10,11,12,13,17,18,20,21,22,23,27,28, 29,30,31, and 32 with alveoloplasty.;  Surgeon: Lenn Cal, DDS;  Location: [place];  Service: Oral Surgery;  Laterality: N/A;  . PERMANENT PACEMAKER INSERTION N/A 02/09/2013   Procedure: PERMANENT PACEMAKER INSERTION;  Surgeon: Evans Lance, MD;  Location: [facility] CATH LAB;  Service: Cardiovascular;  Laterality: N/A;  . SUBTOTAL COLECTOMY  09/01/2005   subtotal colectomy  . TEMPORARY PACEMAKER INSERTION Right 02/09/2013   Procedure: TEMPORARY PACEMAKER INSERTION;  Surgeon: Sinclair Grooms, MD;  Location[facility] C[facility] LAB;  Service: Cardiovascular;  Laterality: Right;    Prior to Admission medications   Medication Sig Start Date End Date Taking? Authorizing Provider  acetaminophen (TYLENOL) 500 MG tablet Take 1 tablet by mouth as needed. 01/08/14  Yes [provider]  albuterol (PROVENTIL HFA;VENTOLIN HFA) 108 (90 BASE) MCG/ACT inhaler Inhale 2 puffs into the lungs every 6 (six) hours as needed. For shortness of breath   Yes [provider]  albuterol (PROVENTIL) (2.5 MG/3ML) 0.083% nebulizer solution Take 2.5 mg by nebulization every 6 (  six) hours as needed. For shortness of breath   Yes [provider]  aspirin EC 81 MG tablet Take 1 tablet by mouth daily. 01/08/14  Yes [provider]  carvedilol (COREG) 6.25 MG tablet Take 6.25 mg by mouth 2 (two) times daily with a meal.  05/04/12  Yes [provider]  furosemide (LASIX) 40 MG tablet Take 1 tablet (40 mg total) by mouth 2 (two) times daily. Patient taking  differently: Take 20-40 mg by mouth See admin instructions. 40mg  in the morning and 20mg  in the evening 03/25/17 06/23/17 Yes Lendon Colonel, NP  gabapentin (NEURONTIN) 300 MG capsule Take 300 mg by mouth at bedtime.   Yes [provider]  insulin glargine (LANTUS) 100 UNIT/ML injection Inject 20 Units into the skin 2 (two) times daily.    Yes [provider]  lisinopril (PRINIVIL,ZESTRIL) 10 MG tablet Take 1 tablet by mouth daily. 03/08/17  Yes [provider]  loratadine (CLARITIN) 10 MG tablet Take 10 mg by mouth daily as needed for allergies. As needed 01/21/15  Yes [provider]  magnesium oxide (MAG-OX) 400 MG tablet Take 400 mg by mouth 2 (two) times daily.   Yes [provider]  metFORMIN (GLUCOPHAGE) 500 MG tablet Take 1,000 mg by mouth 2 (two) times daily with a meal.   Yes [provider]  metolazone (ZAROXOLYN) 2.5 MG tablet Take 2.5 mg on Sunday and Wednesday ONLY Patient taking differently: Take 2.5 mg by mouth 2 (two) times a week. Take 2.5 mg on Sunday and Wednesday ONLY 08/30/16  Yes Lendon Colonel, NP  Multiple Vitamin (MULTIVITAMIN) tablet Take 1 tablet by mouth daily.   Yes [provider]  nitroGLYCERIN (NITROSTAT) 0.4 MG SL tablet Place 0.4 mg under the tongue every 5 (five) minutes x 3 doses as needed. For chest pain   Yes [provider]  ondansetron (ZOFRAN) 4 MG tablet Take 4 mg by mouth 4 (four) times daily as needed for nausea or vomiting.   Yes [provider]  sertraline (ZOLOFT) 50 MG tablet Take 50 mg by mouth daily.   Yes [provider]  simvastatin (ZOCOR) 80 MG tablet Take 40 mg by mouth at bedtime.   Yes [provider]  tiZANidine (ZANAFLEX) 4 MG tablet Take 4 mg by mouth 3 (three) times daily.   Yes [provider]  warfarin (COUMADIN) 4 MG tablet Take 2 tablets daily except 3 tablets on Wednesdays Patient taking differently: Take 8-12 mg by mouth  See admin instructions. Take 2 tablets daily except 3 tablets on Wednesdays 03/02/17  Yes Evans Lance, MD  potassium chloride SA (K-DUR,KLOR-CON) 20 MEQ tablet Take 1 tablet (20 mEq total) by mouth daily. 03/25/17   Lendon Colonel, NP  traMADol (ULTRAM) 50 MG tablet Take 50-100 mg by mouth every 4 (four) hours as needed.  11/08/14   [provider]    Allergies as of 03/25/2017 - Review Complete 03/25/2017  Allergen Reaction Noted  . Vancomycin Itching and Rash 01/15/2012    Family History  Problem Relation Age of Onset  . Colon cancer Mother        Ad Guttman, ?>age 28.  [name] Heart disease Mother   . Heart disease Father   . Hypertension Brother   . Heart disease Brother   . Diabetes Brother   . Liver disease Neg Hx     Social History   Social History  . Marital status: Married  Spouse name: N/A  . Number of children: 1  . Years of education: N/A   Occupational History  . Parts Manager     Disabled   Social History Main Topics  . Smoking status: Current Every Day Smoker    Packs/day: 1.00    Years: 40.00    Types: Cigarettes    Start date: 07/13/1971  . Smokeless tobacco: Never Used  . Alcohol use No     Comment: Quit 2005 from a 6 pack a day use  . Drug use: No  . Sexual activity: No   Other Topics Concern  . Not on file   Social History Narrative  . No narrative on file    Review of Systems: See HPI, otherwise negative ROS   Physical Exam: BP 131/79   Pulse 62   Temp 97.8 F (36.6 C) (Oral)   Resp 18   Ht 6' (1.829 m)   Wt 179 lb 4.8 oz (81.3 kg)   SpO2 95%   BMI 24.32 kg/m  General:   Alert,  pleasant and cooperative in NAD Head:  Normocephalic and atraumatic. Neck:  Supple; Lungs:  Clear throughout to auscultation.    Heart:  Regular rate and rhythm. Abdomen:  Soft, nontender and nondistended. Normal bowel sounds, without guarding, and without rebound.   Neurologic:  Alert and  oriented x4;  grossly normal  neurologically.  Impression/Plan:     ANEMIA/BRBPR/HEMATEMESIS  PLAN: 1. EGD/FLEX SIG TODAY.

## 2017-03-29 NOTE — Op Note (Addendum)
[facility] Patient Name: [name] Procedure Date: 03/29/2017 5:40 PM MRN: [id number] Date of Birth: 24-Jan-1956 Attending MD: Barney Drain , MD CSN: [id number] Age: 61 Admit Type: Inpatient Procedure:                Upper GI endoscopy WITH COLD FORCEPS BIOPSY Indications:              Iron deficiency anemia, Hematemesis Providers:                Barney Drain, MD, Charlyne Petrin RN, RN, Hinton Rao, RN Referring MD:             Jasper Loser. Luan Pulling MD, MD Medicines:                Promethazine 12.5 mg IV, Meperidine 100 mg IV,                            Midazolam 4 mg IV Complications:            No immediate complications. Estimated Blood Loss:     Estimated blood loss was minimal. Procedure:                Pre-Anesthesia Assessment:                           - Prior to the procedure, a History and Physical                            was performed, and patient medications and                            allergies were reviewed. The patient's tolerance of                            previous anesthesia was also reviewed. The risks                            and benefits of the procedure and the sedation                            options and risks were discussed with the patient.                            All questions were answered, and informed consent                            was obtained. Prior Anticoagulants: The patient has                            taken Coumadin (warfarin), last dose was 5 days                            prior to procedure. ASA Grade Assessment: II - A  patient with mild systemic disease. After reviewing                            the risks and benefits, the patient was deemed in                            satisfactory condition to undergo the procedure.                            After obtaining informed consent, the endoscope was                            passed under direct vision. Throughout  the                            procedure, the patient's blood pressure, pulse, and                            oxygen saturations were monitored continuously. The                            [id number]) scope was introduced through the                            mouth, and advanced to the second part of duodenum.                            The upper GI endoscopy was accomplished without                            difficulty. The patient tolerated the procedure                            well. Scope In: 6:27:14 PM Scope Out: 6:40:18 PM Total Procedure Duration: 0 hours 13 minutes 4 seconds  Findings:      Salmon-colored mucosa was present. The maximum longitudinal extent of       these esophageal mucosal changes was 3 cm in length. Biopsies were taken       with a cold forceps for histology.      Diffuse moderate inflammation characterized by congestion (edema),       erosions and erythema was found in the gastric antrum. Biopsies were       taken with a cold forceps for Helicobacter pylori testing.      Patchy mild inflammation characterized by congestion (edema) and       erythema was found in the second portion of the duodenum. Biopsies for       histology were taken with a cold forceps for evaluation of celiac       disease.      A single 3 mm polyp was found in the esophagus. The polyp was removed       with a cold biopsy forceps. Resection and retrieval were complete. Impression:               - PROBABLE BARRETT'S ESOPHAGUS-SHORT SEGMENT.                           -  ANEMIA DUE TO Gastritis AND DUODENITIS                           - BENIGN SQUAMOUS PAPILLOMA REMOVED Moderate Sedation:      Moderate (conscious) sedation was administered by the endoscopy nurse       and supervised by the endoscopist. The following parameters were       monitored: oxygen saturation, heart rate, blood pressure, and response       to care. Total physician intraservice time was 40  minutes. Recommendation:           - Await pathology results.                           - Cardiac diet.                           - Continue present medications.                           - Return to my office in 4 months.                           - Return patient to hospital ward for ongoing care.                           - Repeat upper endoscopy in 3 - 5 years for                            surveillance. Procedure Code(s):        --- Professional ---                           [id number], Esophagogastroduodenoscopy, flexible,                            transoral; with biopsy, single or multiple                           99152, Moderate sedation services provided by the                            same physician or other qualified health care                            professional performing the diagnostic or                            therapeutic service that the sedation supports,                            requiring the presence of an independent trained                            observer to assist in the monitoring of the  patient's level of consciousness and physiological                            status; initial 15 minutes of intraservice time,                            patient age 46 years or older                           [phone], Moderate sedation services; each additional                            15 minutes intraservice time                           99153, Moderate sedation services; each additional                            15 minutes intraservice time Diagnosis Code(s):        --- Professional ---                           K22.8, Other specified diseases of esophagus                           K29.70, Gastritis, unspecified, without bleeding                           K29.80, Duodenitis without bleeding                           D50.9, Iron deficiency anemia, unspecified                           K92.0, Hematemesis CPT copyright 2016 American  Medical Association. All rights reserved. The codes documented in this report are preliminary and upon coder review may  be revised to meet current compliance requirements. Barney Drain, MD Barney Drain, MD 03/29/2017 7:25:01 PM This report has been signed electronically. Number of Addenda: 0

## 2017-03-29 NOTE — Progress Notes (Signed)
Subjective: He was admitted with anemia and increased problems with heart failure. Part of the difficulty with his heart failure appears to have been dietary indiscretion and he has been seen by the dietitian for help with that. He has received blood because of his anemia. He is to undergo EGD and colonoscopy today. He says he feels well. He has no complaints  Objective: Vital signs in last 24 hours: Temp:  [97.7 F (36.5 C)-98.4 F (36.9 C)] 97.7 F (36.5 C) (06/26 0435) Pulse Rate:  [60-67] 67 (06/26 0435) Resp:  [18-20] 18 (06/26 0435) BP: (118-129)/(57-71) 118/57 (06/26 0435) SpO2:  [89 %-97 %] 89 % (06/26 0845) Weight:  [81.3 kg (179 lb 4.8 oz)] 81.3 kg (179 lb 4.8 oz) (06/26 0435) Weight change: 1.27 kg (2 lb 12.8 oz) Last BM Date: 03/28/17  Intake/Output from previous day: 06/25 0701 - 06/26 0700 In: 440 [P.O.:440] Out: 1850 [Urine:1850]  PHYSICAL EXAM General appearance: alert, cooperative and no distress Resp: clear to auscultation bilaterally Cardio: irregularly irregular rhythm GI: soft, non-tender; bowel sounds normal; no masses,  no organomegaly Extremities: venous stasis dermatitis noted Skin warm and dry  Lab Results:  Results for orders placed or performed during the hospital encounter of 03/25/17 (from the past 48 hour(s))  CBC     Status: Abnormal   Collection Time: 03/27/17 10:18 AM  Result Value Ref Range   WBC 5.2 4.0 - 10.5 K/uL   RBC 4.48 4.22 - 5.81 MIL/uL   Hemoglobin 10.9 (L) 13.0 - 17.0 g/dL   HCT 35.4 (L) 39.0 - 52.0 %   MCV 79.0 78.0 - 100.0 fL   MCH 24.3 (L) 26.0 - 34.0 pg   MCHC 30.8 30.0 - 36.0 g/dL   RDW 17.3 (H) 11.5 - 15.5 %   Platelets 228 150 - 400 K/uL    Comment: SPECIMEN CHECKED FOR CLOTS PLATELET COUNT CONFIRMED BY SMEAR   Glucose, capillary     Status: Abnormal   Collection Time: 03/27/17 12:05 PM  Result Value Ref Range   Glucose-Capillary 210 (H) 65 - 99 mg/dL  Glucose, capillary     Status: Abnormal   Collection Time:  03/27/17  4:17 PM  Result Value Ref Range   Glucose-Capillary 152 (H) 65 - 99 mg/dL  Glucose, capillary     Status: Abnormal   Collection Time: 03/27/17 11:43 PM  Result Value Ref Range   Glucose-Capillary 308 (H) 65 - 99 mg/dL   Comment 1 Notify RN    Comment 2 Document in Chart   Basic metabolic panel     Status: Abnormal   Collection Time: 03/28/17  6:12 AM  Result Value Ref Range   Sodium 137 135 - 145 mmol/L   Potassium 4.3 3.5 - 5.1 mmol/L   Chloride 95 (L) 101 - 111 mmol/L   CO2 32 22 - 32 mmol/L   Glucose, Bld 209 (H) 65 - 99 mg/dL   BUN 27 (H) 6 - 20 mg/dL   Creatinine, Ser 1.53 (H) 0.61 - 1.24 mg/dL   Calcium 9.0 8.9 - 10.3 mg/dL   GFR calc non Af Amer 48 (L) >60 mL/min   GFR calc Af Amer 55 (L) >60 mL/min    Comment: (NOTE) The eGFR has been calculated using the CKD EPI equation. This calculation has not been validated in all clinical situations. eGFR's persistently <60 mL/min signify possible Chronic Kidney Disease.    Anion gap 10 5 - 15  CBC with Differential/Platelet     Status:  Abnormal   Collection Time: 03/28/17  6:12 AM  Result Value Ref Range   WBC 5.9 4.0 - 10.5 K/uL   RBC 4.62 4.22 - 5.81 MIL/uL   Hemoglobin 11.3 (L) 13.0 - 17.0 g/dL   HCT 36.6 (L) 39.0 - 52.0 %   MCV 79.2 78.0 - 100.0 fL   MCH 24.5 (L) 26.0 - 34.0 pg   MCHC 30.9 30.0 - 36.0 g/dL   RDW 17.7 (H) 11.5 - 15.5 %   Platelets 215 150 - 400 K/uL    Comment: PLATELET COUNT CONFIRMED BY SMEAR SPECIMEN CHECKED FOR CLOTS    Neutrophils Relative % 65 %   Neutro Abs 3.8 1.7 - 7.7 K/uL   Lymphocytes Relative 19 %   Lymphs Abs 1.1 0.7 - 4.0 K/uL   Monocytes Relative 11 %   Monocytes Absolute 0.7 0.1 - 1.0 K/uL   Eosinophils Relative 3 %   Eosinophils Absolute 0.2 0.0 - 0.7 K/uL   Basophils Relative 1 %   Basophils Absolute 0.1 0.0 - 0.1 K/uL  Glucose, capillary     Status: Abnormal   Collection Time: 03/28/17  8:07 AM  Result Value Ref Range   Glucose-Capillary 224 (H) 65 - 99 mg/dL    Comment 1 Notify RN   Glucose, capillary     Status: Abnormal   Collection Time: 03/28/17 11:43 AM  Result Value Ref Range   Glucose-Capillary 254 (H) 65 - 99 mg/dL   Comment 1 Notify RN   Protime-INR     Status: Abnormal   Collection Time: 03/28/17 12:54 PM  Result Value Ref Range   Prothrombin Time 15.3 (H) 11.4 - 15.2 seconds   INR 1.21   Glucose, capillary     Status: Abnormal   Collection Time: 03/28/17  4:41 PM  Result Value Ref Range   Glucose-Capillary 105 (H) 65 - 99 mg/dL  Glucose, capillary     Status: Abnormal   Collection Time: 03/28/17  9:20 PM  Result Value Ref Range   Glucose-Capillary 256 (H) 65 - 99 mg/dL  Basic metabolic panel     Status: Abnormal   Collection Time: 03/29/17  5:48 AM  Result Value Ref Range   Sodium 134 (L) 135 - 145 mmol/L   Potassium 4.1 3.5 - 5.1 mmol/L   Chloride 95 (L) 101 - 111 mmol/L   CO2 32 22 - 32 mmol/L   Glucose, Bld 103 (H) 65 - 99 mg/dL   BUN 30 (H) 6 - 20 mg/dL   Creatinine, Ser 1.49 (H) 0.61 - 1.24 mg/dL   Calcium 8.7 (L) 8.9 - 10.3 mg/dL   GFR calc non Af Amer 49 (L) >60 mL/min   GFR calc Af Amer 57 (L) >60 mL/min    Comment: (NOTE) The eGFR has been calculated using the CKD EPI equation. This calculation has not been validated in all clinical situations. eGFR's persistently <60 mL/min signify possible Chronic Kidney Disease.    Anion gap 7 5 - 15  Glucose, capillary     Status: None   Collection Time: 03/29/17  7:47 AM  Result Value Ref Range   Glucose-Capillary 86 65 - 99 mg/dL    ABGS No results for input(s): PHART, PO2ART, TCO2, HCO3 in the last 72 hours.  Invalid input(s): PCO2 CULTURES No results found for this or any previous visit (from the past 240 hour(s)). Studies/Results: No results found.  Medications:  Prior to Admission:  Prescriptions Prior to Admission  Medication Sig Dispense Refill Last Dose  .  acetaminophen (TYLENOL) 500 MG tablet Take 1 tablet by mouth as needed.   unknown  . albuterol  (PROVENTIL HFA;VENTOLIN HFA) 108 (90 BASE) MCG/ACT inhaler Inhale 2 puffs into the lungs every 6 (six) hours as needed. For shortness of breath   unknown  . albuterol (PROVENTIL) (2.5 MG/3ML) 0.083% nebulizer solution Take 2.5 mg by nebulization every 6 (six) hours as needed. For shortness of breath   Past Week at Unknown time  . aspirin EC 81 MG tablet Take 1 tablet by mouth daily.   03/25/2017 at Unknown time  . carvedilol (COREG) 6.25 MG tablet Take 6.25 mg by mouth 2 (two) times daily with a meal.    03/25/2017 at 1330  . furosemide (LASIX) 40 MG tablet Take 1 tablet (40 mg total) by mouth 2 (two) times daily. (Patient taking differently: Take 20-40 mg by mouth See admin instructions. 88m in the morning and 247min the evening) 180 tablet 3 03/25/2017 at Unknown time  . gabapentin (NEURONTIN) 300 MG capsule Take 300 mg by mouth at bedtime.   03/24/2017 at Unknown time  . insulin glargine (LANTUS) 100 UNIT/ML injection Inject 20 Units into the skin 2 (two) times daily.    03/25/2017 at Unknown time  . lisinopril (PRINIVIL,ZESTRIL) 10 MG tablet Take 1 tablet by mouth daily.   03/25/2017 at Unknown time  . loratadine (CLARITIN) 10 MG tablet Take 10 mg by mouth daily as needed for allergies. As needed   Past Week at Unknown time  . magnesium oxide (MAG-OX) 400 MG tablet Take 400 mg by mouth 2 (two) times daily.   03/25/2017 at Unknown time  . metFORMIN (GLUCOPHAGE) 500 MG tablet Take 1,000 mg by mouth 2 (two) times daily with a meal.   03/25/2017 at Unknown time  . metolazone (ZAROXOLYN) 2.5 MG tablet Take 2.5 mg on Sunday and Wednesday ONLY (Patient taking differently: Take 2.5 mg by mouth 2 (two) times a week. Take 2.5 mg on Sunday and Wednesday ONLY) 24 tablet 3 03/23/2017 at Unknown time  . Multiple Vitamin (MULTIVITAMIN) tablet Take 1 tablet by mouth daily.   03/25/2017 at Unknown time  . nitroGLYCERIN (NITROSTAT) 0.4 MG SL tablet Place 0.4 mg under the tongue every 5 (five) minutes x 3 doses as needed. For  chest pain   unknown  . ondansetron (ZOFRAN) 4 MG tablet Take 4 mg by mouth 4 (four) times daily as needed for nausea or vomiting.   unknown  . sertraline (ZOLOFT) 50 MG tablet Take 50 mg by mouth daily.   03/25/2017 at Unknown time  . simvastatin (ZOCOR) 80 MG tablet Take 40 mg by mouth at bedtime.   03/24/2017 at Unknown time  . tiZANidine (ZANAFLEX) 4 MG tablet Take 4 mg by mouth 3 (three) times daily.   03/25/2017 at Unknown time  . warfarin (COUMADIN) 4 MG tablet Take 2 tablets daily except 3 tablets on Wednesdays (Patient taking differently: Take 8-12 mg by mouth See admin instructions. Take 2 tablets daily except 3 tablets on Wednesdays) 163 tablet 3 03/24/2017 at 2200  . potassium chloride SA (K-DUR,KLOR-CON) 20 MEQ tablet Take 1 tablet (20 mEq total) by mouth daily. 90 tablet 3   . traMADol (ULTRAM) 50 MG tablet Take 50-100 mg by mouth every 4 (four) hours as needed.    unknown   Scheduled: . albuterol  2.5 mg Nebulization BID  . atorvastatin  40 mg Oral q1800  . carvedilol  6.25 mg Oral BID WC  . gabapentin  300  mg Oral QHS  . insulin aspart  0-15 Units Subcutaneous TID WC  . insulin aspart  0-5 Units Subcutaneous QHS  . insulin glargine  7 Units Subcutaneous QHS  . lisinopril  10 mg Oral Daily  . metolazone  2.5 mg Oral Once per day on Sun Wed  . pantoprazole  40 mg Oral QAC breakfast  . potassium chloride SA  40 mEq Oral BID  . promethazine  12.5 mg Intravenous On Call  . sertraline  50 mg Oral Daily   Continuous: . sodium chloride    . sodium chloride     JAS:[id number], loratadine, ondansetron (ZOFRAN) IV, ondansetron, traMADol  Assesment: He was admitted with acute on chronic combined systolic and diastolic heart failure. He is significantly improved from that. He has been receiving IV diuresis and it is recommended that we reduce that dose yesterday and perhaps switch to oral diuresis will hold diuresis today per cardiology so I will leave that decision to cardiology. He  has anemia with heme positive stool and that is being worked up.  At baseline he has COPD and that's doing fairly well.  At baseline he has coronary disease but he denies any chest pain  He has hypertension which is well controlled  He has diabetes which seems to be doing okay Principal Problem:   Acute on chronic combined systolic and diastolic CHF (congestive heart failure) (HCC) Active Problems:   COPD (chronic obstructive pulmonary disease) (HCC)   Arteriosclerotic cardiovascular disease (ASCVD)   Diabetes mellitus, type II ([place])   Hypertension   Hyperlipidemia   Tobacco abuse   Anemia, normocytic normochromic   Biventricular implantable cardioverter-defibrillator in situ   Atrial fibrillation (HCC)   History of colon cancer   Heme positive stool    Plan: Continue current medications. EGD and colonoscopy today.    LOS: 4 days   Tagen Brethauer L 03/29/2017, 8:59 AM

## 2017-03-29 NOTE — Op Note (Addendum)
[facility] Patient Name: [name] Procedure Date: 03/29/2017 6:40 PM MRN: [id number] Date of Birth: 1956/02/16 Attending MD: Barney Drain , MD CSN: [id number] Age: 61 Admit Type: Inpatient Procedure:                Flexible Sigmoidoscopy WITH APC, COLD FORCEPS                            BIOPSY/POLYPECTOMY Indications:              Hematochezia Providers:                Barney Drain, MD, Charlyne Petrin RN, RN, Hinton Rao, RN Referring MD:             Jasper Loser. Luan Pulling MD, MD Medicines:                None Complications:            No immediate complications. Estimated Blood Loss:     Estimated blood loss was minimal. Procedure:                Pre-Anesthesia Assessment:                           - Prior to the procedure, a History and Physical                            was performed, and patient medications and                            allergies were reviewed. The patient's tolerance of                            previous anesthesia was also reviewed. The risks                            and benefits of the procedure and the sedation                            options and risks were discussed with the patient.                            All questions were answered, and informed consent                            was obtained. Prior Anticoagulants: The patient has                            taken Coumadin (warfarin), last dose was 5 days                            prior to procedure. ASA Grade Assessment: II - A  patient with mild systemic disease. After reviewing                            the risks and benefits, the patient was deemed in                            satisfactory condition to undergo the procedure.                            After obtaining informed consent, the scope was                            passed under direct vision. The [id number]                            scope was introduced  through the anus and advanced                            to the the ileo-rectal anastomosis. The flexible                            sigmoidoscopy was accomplished without difficulty.                            The patient tolerated the procedure well. The                            quality of the bowel preparation was 30 percent                            obscured. Scope In: 6:44:41 PM Scope Out: 6:59:31 PM Total Procedure Duration: 0 hours 14 minutes 50 seconds  Findings:      The digital rectal exam findings include non-thrombosed internal       hemorrhoids. Pertinent negatives include normal sphincter tone.      A 3 mm polyp was found in the rectum. The polyp was sessile. The polyp       was removed with a cold biopsy forceps. Resection and retrieval were       complete.      A single localized bleeding erosion was found at the anastomosis.       Stigmata of recent bleeding were present. Coagulation for hemostasis       using argon plasma at 0.8 liters/minute and 20 watts was successful.      The ileum appeared normal. Impression:               - INTERMITTENT RECTAL BLEEDING DUE TO ISCHEMIC                            EROSION AND HEMORRHOIDS                           - One 3 mm polyp in the rectum, removed with a cold  biopsy forceps. Resected and retrieved.                           - RECTAL BLEEDING/ANEMIA DUE TO OOZING erosion at                            the colonic anastomosis WHILE ON ANTICOAGULATION.                            Treated with argon plasma coagulation (APC).                           - The NEO-terminal ileum is normal. Moderate Sedation:      Moderate (conscious) sedation was administered by the endoscopy nurse       and supervised by the endoscopist. The following parameters were       monitored: oxygen saturation, heart rate, blood pressure, and response       to care. Total physician intraservice time was 40 minutes. Recommendation:            - Resume previous diet.                           - Continue present medications. RE-START COUMADIN                            JUN 27.                           - Await pathology results.                           - Repeat flexible sigmoidoscopy in 3 years for                            surveillance.                           - Return patient to hospital ward for ongoing care. Procedure Code(s):        --- Professional ---                           [phone], 1, Sigmoidoscopy, flexible; with control of                            bleeding, any method                           45331, Sigmoidoscopy, flexible; with biopsy, single                            or multiple                           99152, Moderate sedation services provided by the                            same physician  or other qualified health care                            professional performing the diagnostic or                            therapeutic service that the sedation supports,                            requiring the presence of an independent trained                            observer to assist in the monitoring of the                            patient's level of consciousness and physiological                            status; initial 15 minutes of intraservice time,                            patient age 78 years or older                           [phone], Moderate sedation services; each additional                            15 minutes intraservice time                           99153, Moderate sedation services; each additional                            15 minutes intraservice time Diagnosis Code(s):        --- Professional ---                           K62.1, Rectal polyp                           K63.3, Ulcer of intestine                           K64.8, Other hemorrhoids                           K92.1, Melena (includes Hematochezia) CPT copyright 2016 [employer]. All rights  reserved. The codes documented in this report are preliminary and upon coder review may  be revised to meet current compliance requirements. Barney Drain, MD Barney Drain, MD 03/29/2017 7:18:22 PM This report has been signed electronically. Number of Addenda: 0

## 2017-03-30 ENCOUNTER — Telehealth: Payer: Self-pay | Admitting: Gastroenterology

## 2017-03-30 ENCOUNTER — Encounter (HOSPITAL_COMMUNITY): Payer: Self-pay | Admitting: Gastroenterology

## 2017-03-30 ENCOUNTER — Encounter: Payer: Self-pay | Admitting: Gastroenterology

## 2017-03-30 LAB — BASIC METABOLIC PANEL
ANION GAP: 6 (ref 5–15)
BUN: 34 mg/dL — ABNORMAL HIGH (ref 6–20)
CALCIUM: 8.7 mg/dL — AB (ref 8.9–10.3)
CO2: 29 mmol/L (ref 22–32)
Chloride: 97 mmol/L — ABNORMAL LOW (ref 101–111)
Creatinine, Ser: 1.52 mg/dL — ABNORMAL HIGH (ref 0.61–1.24)
GFR calc Af Amer: 56 mL/min — ABNORMAL LOW (ref >60)
GFR, EST NON AFRICAN AMERICAN: 48 mL/min — AB (ref >60)
Glucose, Bld: 306 mg/dL — ABNORMAL HIGH (ref 65–99)
POTASSIUM: 5.4 mmol/L — AB (ref 3.5–5.1)
SODIUM: 132 mmol/L — AB (ref 135–145)

## 2017-03-30 LAB — GLUCOSE, CAPILLARY
GLUCOSE-CAPILLARY: 304 mg/dL — AB (ref 65–99)
GLUCOSE-CAPILLARY: 305 mg/dL — AB (ref 65–99)
Glucose-Capillary: 86 mg/dL (ref 65–99)
Glucose-Capillary: 431 mg/dL — ABNORMAL HIGH (ref 65–99)

## 2017-03-30 LAB — CBC WITH DIFFERENTIAL/PLATELET
BASOS ABS: 0 10*3/uL (ref 0.0–0.1)
Basophils Relative: 0 %
EOS ABS: 0.2 10*3/uL (ref 0.0–0.7)
Eosinophils Relative: 3 %
HCT: 33.9 % — ABNORMAL LOW (ref 39.0–52.0)
Hemoglobin: 10.4 g/dL — ABNORMAL LOW (ref 13.0–17.0)
Lymphocytes Relative: 21 %
Lymphs Abs: 1.1 10*3/uL (ref 0.7–4.0)
MCH: 24.5 pg — AB (ref 26.0–34.0)
MCHC: 30.7 g/dL (ref 30.0–36.0)
MCV: 80 fL (ref 78.0–100.0)
MONO ABS: 0.5 10*3/uL (ref 0.1–1.0)
Monocytes Relative: 9 %
Neutro Abs: 3.5 10*3/uL (ref 1.7–7.7)
Neutrophils Relative %: 66 %
PLATELETS: 189 10*3/uL (ref 150–400)
RBC: 4.24 MIL/uL (ref 4.22–5.81)
RDW: 17.8 % — AB (ref 11.5–15.5)
WBC: 5.2 10*3/uL (ref 4.0–10.5)

## 2017-03-30 MED ORDER — INSULIN ASPART 100 UNIT/ML ~~LOC~~ SOLN
20.0000 [IU] | Freq: Once | SUBCUTANEOUS | Status: AC
  Administered 2017-03-30: 20 [IU] via SUBCUTANEOUS

## 2017-03-30 MED ORDER — SODIUM POLYSTYRENE SULFONATE 15 GM/60ML PO SUSP
30.0000 g | Freq: Once | ORAL | Status: AC
  Administered 2017-03-30: 30 g via ORAL
  Filled 2017-03-30: qty 120

## 2017-03-30 NOTE — Progress Notes (Addendum)
Subjective: He says he feels well and wants to go home. He has no complaints. His breathing is doing okay. No bleeding. No abdominal pain. No chest pain. He had GI EGD and colonoscopy yesterday and he had some bleeding at his anastomosis site from his previous colon cancer and had a polyp in his colon which was removed. He had what may be some Barrett's esophagus and in his esophagus and had gastritis and duodenitis. He feels better.  Objective: Vital signs in last 24 hours: Temp:  [97.8 F (36.6 C)-98.6 F (37 C)] 98.6 F (37 C) (06/27 0500) Pulse Rate:  [60-70] 66 (06/27 0500) Resp:  [8-21] 18 (06/27 0500) BP: (85-161)/(45-89) 120/56 (06/27 0500) SpO2:  [92 %-100 %] 92 % (06/27 0838) Weight:  [81.4 kg (179 lb 8 oz)] 81.4 kg (179 lb 8 oz) (06/27 0500) Weight change: 0.091 kg (3.2 oz) Last BM Date: 03/29/17  Intake/Output from previous day: 06/26 0701 - 06/27 0700 In: 579 [P.O.:480; I.V.:99] Out: 850 [Urine:850]  PHYSICAL EXAM General appearance: alert, cooperative and no distress Resp: clear to auscultation bilaterally Cardio: regular rate and rhythm, S1, S2 normal, no murmur, click, rub or gallop GI: soft, non-tender; bowel sounds normal; no masses,  no organomegaly Extremities: venous stasis dermatitis noted Skin warm and dry. Mucous membranes are moist  Lab Results:  Results for orders placed or performed during the hospital encounter of 03/25/17 (from the past 48 hour(s))  Glucose, capillary     Status: Abnormal   Collection Time: 03/28/17 11:43 AM  Result Value Ref Range   Glucose-Capillary 254 (H) 65 - 99 mg/dL   Comment 1 Notify RN   Protime-INR     Status: Abnormal   Collection Time: 03/28/17 12:54 PM  Result Value Ref Range   Prothrombin Time 15.3 (H) 11.4 - 15.2 seconds   INR 1.21   Glucose, capillary     Status: Abnormal   Collection Time: 03/28/17  4:41 PM  Result Value Ref Range   Glucose-Capillary 105 (H) 65 - 99 mg/dL  Glucose, capillary     Status:  Abnormal   Collection Time: 03/28/17  9:20 PM  Result Value Ref Range   Glucose-Capillary 256 (H) 65 - 99 mg/dL  Basic metabolic panel     Status: Abnormal   Collection Time: 03/29/17  5:48 AM  Result Value Ref Range   Sodium 134 (L) 135 - 145 mmol/L   Potassium 4.1 3.5 - 5.1 mmol/L   Chloride 95 (L) 101 - 111 mmol/L   CO2 32 22 - 32 mmol/L   Glucose, Bld 103 (H) 65 - 99 mg/dL   BUN 30 (H) 6 - 20 mg/dL   Creatinine, Ser 1.49 (H) 0.61 - 1.24 mg/dL   Calcium 8.7 (L) 8.9 - 10.3 mg/dL   GFR calc non Af Amer 49 (L) >60 mL/min   GFR calc Af Amer 57 (L) >60 mL/min    Comment: (NOTE) The eGFR has been calculated using the CKD EPI equation. This calculation has not been validated in all clinical situations. eGFR's persistently <60 mL/min signify possible Chronic Kidney Disease.    Anion gap 7 5 - 15  Glucose, capillary     Status: None   Collection Time: 03/29/17  7:47 AM  Result Value Ref Range   Glucose-Capillary 86 65 - 99 mg/dL  Glucose, capillary     Status: Abnormal   Collection Time: 03/29/17 11:39 AM  Result Value Ref Range   Glucose-Capillary 233 (H) 65 - 99 mg/dL  Comment 1 Notify RN   Glucose, capillary     Status: None   Collection Time: 03/29/17  4:33 PM  Result Value Ref Range   Glucose-Capillary 86 65 - 99 mg/dL  Glucose, capillary     Status: Abnormal   Collection Time: 03/29/17  9:26 PM  Result Value Ref Range   Glucose-Capillary 146 (H) 65 - 99 mg/dL  Glucose, capillary     Status: Abnormal   Collection Time: 03/30/17  8:04 AM  Result Value Ref Range   Glucose-Capillary 304 (H) 65 - 99 mg/dL  Basic metabolic panel     Status: Abnormal   Collection Time: 03/30/17  8:15 AM  Result Value Ref Range   Sodium 132 (L) 135 - 145 mmol/L   Potassium 5.4 (H) 3.5 - 5.1 mmol/L    Comment: DELTA CHECK NOTED   Chloride 97 (L) 101 - 111 mmol/L   CO2 29 22 - 32 mmol/L   Glucose, Bld 306 (H) 65 - 99 mg/dL   BUN 34 (H) 6 - 20 mg/dL   Creatinine, Ser 1.52 (H) 0.61 - 1.24  mg/dL   Calcium 8.7 (L) 8.9 - 10.3 mg/dL   GFR calc non Af Amer 48 (L) >60 mL/min   GFR calc Af Amer 56 (L) >60 mL/min    Comment: (NOTE) The eGFR has been calculated using the CKD EPI equation. This calculation has not been validated in all clinical situations. eGFR's persistently <60 mL/min signify possible Chronic Kidney Disease.    Anion gap 6 5 - 15    ABGS No results for input(s): PHART, PO2ART, TCO2, HCO3 in the last 72 hours.  Invalid input(s): PCO2 CULTURES No results found for this or any previous visit (from the past 240 hour(s)). Studies/Results: No results found.  Medications:  Prior to Admission:  Prescriptions Prior to Admission  Medication Sig Dispense Refill Last Dose  . acetaminophen (TYLENOL) 500 MG tablet Take 1 tablet by mouth as needed.   unknown  . albuterol (PROVENTIL HFA;VENTOLIN HFA) 108 (90 BASE) MCG/ACT inhaler Inhale 2 puffs into the lungs every 6 (six) hours as needed. For shortness of breath   unknown  . albuterol (PROVENTIL) (2.5 MG/3ML) 0.083% nebulizer solution Take 2.5 mg by nebulization every 6 (six) hours as needed. For shortness of breath   Past Week at Unknown time  . aspirin EC 81 MG tablet Take 1 tablet by mouth daily.   03/25/2017 at Unknown time  . carvedilol (COREG) 6.25 MG tablet Take 6.25 mg by mouth 2 (two) times daily with a meal.    03/25/2017 at 1330  . furosemide (LASIX) 40 MG tablet Take 1 tablet (40 mg total) by mouth 2 (two) times daily. (Patient taking differently: Take 20-40 mg by mouth See admin instructions. 69m in the morning and 221min the evening) 180 tablet 3 03/25/2017 at Unknown time  . gabapentin (NEURONTIN) 300 MG capsule Take 300 mg by mouth at bedtime.   03/24/2017 at Unknown time  . insulin glargine (LANTUS) 100 UNIT/ML injection Inject 20 Units into the skin 2 (two) times daily.    03/25/2017 at Unknown time  . lisinopril (PRINIVIL,ZESTRIL) 10 MG tablet Take 1 tablet by mouth daily.   03/25/2017 at Unknown time  .  loratadine (CLARITIN) 10 MG tablet Take 10 mg by mouth daily as needed for allergies. As needed   Past Week at Unknown time  . magnesium oxide (MAG-OX) 400 MG tablet Take 400 mg by mouth 2 (two) times daily.  03/25/2017 at Unknown time  . metFORMIN (GLUCOPHAGE) 500 MG tablet Take 1,000 mg by mouth 2 (two) times daily with a meal.   03/25/2017 at Unknown time  . metolazone (ZAROXOLYN) 2.5 MG tablet Take 2.5 mg on Sunday and Wednesday ONLY (Patient taking differently: Take 2.5 mg by mouth 2 (two) times a week. Take 2.5 mg on Sunday and Wednesday ONLY) 24 tablet 3 03/23/2017 at Unknown time  . Multiple Vitamin (MULTIVITAMIN) tablet Take 1 tablet by mouth daily.   03/25/2017 at Unknown time  . nitroGLYCERIN (NITROSTAT) 0.4 MG SL tablet Place 0.4 mg under the tongue every 5 (five) minutes x 3 doses as needed. For chest pain   unknown  . ondansetron (ZOFRAN) 4 MG tablet Take 4 mg by mouth 4 (four) times daily as needed for nausea or vomiting.   unknown  . sertraline (ZOLOFT) 50 MG tablet Take 50 mg by mouth daily.   03/25/2017 at Unknown time  . simvastatin (ZOCOR) 80 MG tablet Take 40 mg by mouth at bedtime.   03/24/2017 at Unknown time  . tiZANidine (ZANAFLEX) 4 MG tablet Take 4 mg by mouth 3 (three) times daily.   03/25/2017 at Unknown time  . warfarin (COUMADIN) 4 MG tablet Take 2 tablets daily except 3 tablets on Wednesdays (Patient taking differently: Take 8-12 mg by mouth See admin instructions. Take 2 tablets daily except 3 tablets on Wednesdays) 163 tablet 3 03/24/2017 at 2200  . potassium chloride SA (K-DUR,KLOR-CON) 20 MEQ tablet Take 1 tablet (20 mEq total) by mouth daily. 90 tablet 3   . traMADol (ULTRAM) 50 MG tablet Take 50-100 mg by mouth every 4 (four) hours as needed.    unknown   Scheduled: . albuterol  2.5 mg Nebulization BID  . atorvastatin  40 mg Oral q1800  . carvedilol  6.25 mg Oral BID WC  . gabapentin  300 mg Oral QHS  . insulin aspart  0-15 Units Subcutaneous TID WC  . insulin  aspart  0-5 Units Subcutaneous QHS  . insulin glargine  7 Units Subcutaneous QHS  . lisinopril  10 mg Oral Daily  . pantoprazole  40 mg Oral QAC breakfast  . potassium chloride SA  40 mEq Oral BID  . sertraline  50 mg Oral Daily   Continuous: . sodium chloride     ZOX:[id number], loratadine, ondansetron (ZOFRAN) IV, ondansetron, traMADol  Assesment: He was admitted with acute on chronic combined systolic and diastolic heart failure. This appears to be related to dietary indiscretions and anemia. He had GI bleeding. At baseline he has COPD, coronary disease, atrial fibrillation chronic anticoagulation and defibrillator placement. He has a history of colon cancer and had some bleeding at the anastomosis site. He says he feels better. Principal Problem:   Acute on chronic combined systolic and diastolic CHF (congestive heart failure) (HCC) Active Problems:   COPD (chronic obstructive pulmonary disease) (HCC)   Arteriosclerotic cardiovascular disease (ASCVD)   Diabetes mellitus, type II (HCC)   Hypertension   Hyperlipidemia   Tobacco abuse   Anemia, normocytic normochromic   Biventricular implantable cardioverter-defibrillator in situ   Atrial fibrillation (HCC)   History of colon cancer   Heme positive stool    Plan: Potential discharge later today. He will probably need to stay off the Coumadin for another week or so but will discuss with cardiology. He did not get diuretics yesterday because he had some change in his renal function. Blood testing is back now in his potassium level is 5.4  and his renal function is a little worse so I don't think it's safe to send him home yet    LOS: 5 days   Cyril Railey L 03/30/2017, 9:12 AM

## 2017-03-30 NOTE — Progress Notes (Signed)
    Subjective: No abdominal pain, N/V. No overt GI bleeding.   Objective: Vital signs in last 24 hours: Temp:  [97.8 F (36.6 C)-98.6 F (37 C)] 98.6 F (37 C) (06/27 0500) Pulse Rate:  [60-70] 66 (06/27 0500) Resp:  [8-21] 18 (06/27 0500) BP: (85-161)/(45-89) 120/56 (06/27 0500) SpO2:  [92 %-100 %] 92 % (06/27 0838) Weight:  [179 lb 8 oz (81.4 kg)] 179 lb 8 oz (81.4 kg) (06/27 0500) Last BM Date: 03/29/17 General:   Alert and oriented, pleasant Head:  Normocephalic and atraumatic. Eyes:  No icterus, sclera clear. Conjuctiva pink.  Abdomen:  Bowel sounds present, soft, non-tender, non-distended.  Extremities:  Without  edema. Neurologic:  Alert and  oriented x4 Psych:  Alert and cooperative. Normal mood and affect.  Intake/Output from previous day: 06/26 0701 - 06/27 0700 In: 579 [P.O.:480; I.V.:99] Out: 850 [Urine:850] Intake/Output this shift: No intake/output data recorded.  Lab Results:  Recent Labs  03/27/17 1018 03/28/17 0612 03/30/17 0815  WBC 5.2 5.9 5.2  HGB 10.9* 11.3* 10.4*  HCT 35.4* 36.6* 33.9*  PLT 228 215 189   BMET  Recent Labs  03/28/17 0612 03/29/17 0548 03/30/17 0815  NA 137 134* 132*  K 4.3 4.1 5.4*  CL 95* 95* 97*  CO2 32 32 29  GLUCOSE 209* 103* 306*  BUN 27* 30* 34*  CREATININE 1.53* 1.49* 1.52*  CALCIUM 9.0 8.7* 8.7*    Assessment: 61 year old male with acute blood loss anemia, heme positive stool, evidence of IDA, history of subtotal colectomy for colon cancer in 2006. Rectal bleeding and anemia due to erosion at the anastomosis, s/p APC therapy at time of flex sig on 6/28. EGD with probable short segment Barrett's esophagus, gastritis, duodenitis, benign squamous papilloma removed. From a GI standpoint is stable. We will follow peripherally.      Plan: May resume Coumadin from a GI standpoint Await pathology from flex sig and EGD  Flex sig in 3 years for surveillance, EGD for Barrett's surveillance Follow-up as  outpatient in 2 months   Annitta Needs, PhD, ANP-BC [facility]     LOS: 5 days    03/30/2017, 9:39 AM

## 2017-03-30 NOTE — Telephone Encounter (Signed)
Please arrange outpatient hospital follow-up in 2 months.  

## 2017-03-30 NOTE — Progress Notes (Signed)
Would continue to hold diuretics today, restart home regimen of lasix 40mg  in AM and 20mg  in PM along with his regular metolazone dose. From cardiac standpoint would be ok for discharge today. Will need outpatient f/u in [redacted] week along with labs. Defer decision about when to restart anticoagulation to GI based on there findings, if neccesary ok to hold coumadin if GI recommends. Mildly elevated potassium, he is receiving kayexelate. If repeat K normal later in day would be ok for discharge.    Zandra Abts MD

## 2017-03-30 NOTE — Telephone Encounter (Signed)
APPT MADE AND LETTER SENT  °

## 2017-03-31 LAB — CUP PACEART REMOTE DEVICE CHECK
Battery Remaining Longevity: 72 mo
Battery Remaining Percentage: 100 %
HighPow Impedance: 78 Ohm
Implantable Lead Implant Date: 20140509
Implantable Lead Implant Date: 20140509
Implantable Lead Location: 753858
Implantable Lead Model: 293
Implantable Lead Model: 4136
Implantable Lead Serial Number: 118610
Implantable Lead Serial Number: 207706
Implantable Lead Serial Number: 29354050
Implantable Pulse Generator Implant Date: 20140509
Lead Channel Impedance Value: 451 Ohm
Lead Channel Pacing Threshold Amplitude: 0.5 V
Lead Channel Pacing Threshold Amplitude: 0.6 V
Lead Channel Pacing Threshold Pulse Width: 0.4 ms
Lead Channel Pacing Threshold Pulse Width: 0.4 ms
Lead Channel Pacing Threshold Pulse Width: 1 ms
Lead Channel Setting Pacing Amplitude: 2 V
Lead Channel Setting Pacing Amplitude: 2.5 V
Lead Channel Setting Pacing Pulse Width: 1 ms
Lead Channel Setting Sensing Sensitivity: 0.6 mV
MDC IDC LEAD IMPLANT DT: 20140509
MDC IDC LEAD LOCATION: 753859
MDC IDC LEAD LOCATION: 753860
MDC IDC MSMT LEADCHNL RA IMPEDANCE VALUE: 637 Ohm
MDC IDC MSMT LEADCHNL RA PACING THRESHOLD AMPLITUDE: 0.7 V
MDC IDC MSMT LEADCHNL RV IMPEDANCE VALUE: 668 Ohm
MDC IDC PG SERIAL: 950230
MDC IDC SESS DTM: 20180607143800
MDC IDC SET LEADCHNL LV SENSING SENSITIVITY: 1 mV
MDC IDC SET LEADCHNL RA PACING AMPLITUDE: 2 V
MDC IDC SET LEADCHNL RV PACING PULSEWIDTH: 0.4 ms
MDC IDC STAT BRADY RA PERCENT PACED: 16 %
MDC IDC STAT BRADY RV PERCENT PACED: 100 %

## 2017-03-31 LAB — BASIC METABOLIC PANEL
Anion gap: 6 (ref 5–15)
BUN: 34 mg/dL — AB (ref 6–20)
CHLORIDE: 99 mmol/L — AB (ref 101–111)
CO2: 28 mmol/L (ref 22–32)
CREATININE: 1.33 mg/dL — AB (ref 0.61–1.24)
Calcium: 9 mg/dL (ref 8.9–10.3)
GFR, EST NON AFRICAN AMERICAN: 57 mL/min — AB (ref >60)
Glucose, Bld: 333 mg/dL — ABNORMAL HIGH (ref 65–99)
Potassium: 4.5 mmol/L (ref 3.5–5.1)
SODIUM: 133 mmol/L — AB (ref 135–145)

## 2017-03-31 LAB — GLUCOSE, CAPILLARY: GLUCOSE-CAPILLARY: 250 mg/dL — AB (ref 65–99)

## 2017-03-31 MED ORDER — PANTOPRAZOLE SODIUM 40 MG PO TBEC: 40.0000 mg | DELAYED_RELEASE_TABLET | Freq: Every day | ORAL | 12 refills | Status: AC

## 2017-03-31 NOTE — Progress Notes (Signed)
He feels better. His renal function is better and his potassium is better. He's not short of breath. He doesn't have any edema now. No bleeding. He is ready for discharge. Please see discharge summary for details

## 2017-03-31 NOTE — Discharge Summary (Signed)
Physician Discharge Summary  Patient ID: [name] MRN: [id number] DOB/AGE: 1956/05/16 61 y.o. Primary Care Physician:Clarise Chacko, Percell Miller, MD Admit date: 03/25/2017 Discharge date: 03/31/2017    Discharge Diagnoses:   Principal Problem:   Acute on chronic combined systolic and diastolic CHF (congestive heart failure) (HCC) Active Problems:   COPD (chronic obstructive pulmonary disease) (HCC)   Arteriosclerotic cardiovascular disease (ASCVD)   Diabetes mellitus, type II (HCC)   Hypertension   Hyperlipidemia   Tobacco abuse   Anemia, normocytic normochromic   Biventricular implantable cardioverter-defibrillator in situ   Atrial fibrillation (HCC)   History of colon cancer   Heme positive stool Hyperkalemia  Allergies as of 03/31/2017      Reactions   Vancomycin Itching, Rash      Medication List    STOP taking these medications   aspirin EC 81 MG tablet   warfarin 4 MG tablet Commonly known as:  COUMADIN     TAKE these medications   acetaminophen 500 MG tablet Commonly known as:  TYLENOL Take 1 tablet by mouth as needed.   albuterol (2.5 MG/3ML) 0.083% nebulizer solution Commonly known as:  PROVENTIL Take 2.5 mg by nebulization every 6 (six) hours as needed. For shortness of breath   albuterol 108 (90 Base) MCG/ACT inhaler Commonly known as:  PROVENTIL HFA;VENTOLIN HFA Inhale 2 puffs into the lungs every 6 (six) hours as needed. For shortness of breath   carvedilol 6.25 MG tablet Commonly known as:  COREG Take 6.25 mg by mouth 2 (two) times daily with a meal.   furosemide 40 MG tablet Commonly known as:  LASIX Take 1 tablet (40 mg total) by mouth 2 (two) times daily. What changed:  how much to take  when to take this  additional instructions   gabapentin 300 MG capsule Commonly known as:  NEURONTIN Take 300 mg by mouth at bedtime.   insulin glargine 100 UNIT/ML injection Commonly known as:  LANTUS Inject 20 Units into the skin 2 (two) times  daily.   lisinopril 10 MG tablet Commonly known as:  PRINIVIL,ZESTRIL Take 1 tablet by mouth daily.   loratadine 10 MG tablet Commonly known as:  CLARITIN Take 10 mg by mouth daily as needed for allergies. As needed   magnesium oxide 400 MG tablet Commonly known as:  MAG-OX Take 400 mg by mouth 2 (two) times daily.   metFORMIN 500 MG tablet Commonly known as:  GLUCOPHAGE Take 1,000 mg by mouth 2 (two) times daily with a meal.   metolazone 2.5 MG tablet Commonly known as:  ZAROXOLYN Take 2.5 mg on Sunday and Wednesday ONLY What changed:  how much to take  how to take this  when to take this  additional instructions   multivitamin tablet Take 1 tablet by mouth daily.   nitroGLYCERIN 0.4 MG SL tablet Commonly known as:  NITROSTAT Place 0.4 mg under the tongue every 5 (five) minutes x 3 doses as needed. For chest pain   ondansetron 4 MG tablet Commonly known as:  ZOFRAN Take 4 mg by mouth 4 (four) times daily as needed for nausea or vomiting.   pantoprazole 40 MG tablet Commonly known as:  PROTONIX Take 1 tablet (40 mg total) by mouth daily before breakfast.   potassium chloride SA 20 MEQ tablet Commonly known as:  K-DUR,KLOR-CON Take 1 tablet (20 mEq total) by mouth daily.   sertraline 50 MG tablet Commonly known as:  ZOLOFT Take 50 mg by mouth daily.   simvastatin 80 MG  tablet Commonly known as:  ZOCOR Take 40 mg by mouth at bedtime.   tiZANidine 4 MG tablet Commonly known as:  ZANAFLEX Take 4 mg by mouth 3 (three) times daily.   traMADol 50 MG tablet Commonly known as:  ULTRAM Take 50-100 mg by mouth every 4 (four) hours as needed.       Discharged Condition:Improved    Consults: Cardiology/gastroenterology  Significant Diagnostic Studies: Dg Chest 2 View  Result Date: 03/25/2017 CLINICAL DATA:  61 year old male with shortness of breath. EXAM: CHEST  2 VIEW COMPARISON:  Chest radiograph dated 09/09/2016 FINDINGS: There is mild central  vascular prominence and diffuse interstitial densities most consistent with mild vascular congestion and interstitial edema. Bibasilar atelectatic changes/scarring. Infiltrate is not excluded. There is a trace right pleural effusion. No pneumothorax. Top-normal cardiac size. Coronary vascular calcification. Left pectoral AICD device. Degenerative changes of the spine. No acute osseous pathology. IMPRESSION: 1. Mild vascular congestion and interstitial edema, increased compared to prior radiograph. 2. Bibasilar atelectatic changes/scarring. Infiltrate is not excluded. Clinical correlation is recommended. 3. Probable trace right pleural effusion. 4. Coronary vascular disease. Electronically Signed   By: Anner Crete M.D.   On: 03/25/2017 20:44    Lab Results: Basic Metabolic Panel:  Recent Labs  03/30/17 0815 03/31/17 0546  NA 132* 133*  K 5.4* 4.5  CL 97* 99*  CO2 29 28  GLUCOSE 306* 333*  BUN 34* 34*  CREATININE 1.52* 1.33*  CALCIUM 8.7* 9.0   Liver Function Tests: No results for input(s): AST, ALT, ALKPHOS, BILITOT, PROT, ALBUMIN in the last 72 hours.   CBC:  Recent Labs  03/30/17 0815  WBC 5.2  NEUTROABS 3.5  HGB 10.4*  HCT 33.9*  MCV 80.0  PLT 189    No results found for this or any previous visit (from the past 240 hour(s)).   Hospital Course: This is a 85-year-old who came to the emergency department because he was told to come in by his cardiologist. He had been seen that day clearly looked like he was in worse heart failure had blood work done which showed that he was anemic and he was directed to the emergency department. He was treated with IV diuretics was given blood transfusion had cardiology and GI consultation. He was found to have anastomotic bleeding at the site of his previous colon resection and also had what may be Barrett's esophagus, gastritis and duodenitis. He improved over the next several days. He had an elevated potassium level which was treated and  he is ready for discharge  Discharge Exam: Blood pressure (!) 146/70, pulse 63, temperature 98.3 F (36.8 C), temperature source Oral, resp. rate 19, height 6' (1.829 m), weight 88.2 kg (194 lb 8 oz), SpO2 95 %. He's awake and alert. No distress. Heart is not regular. His abdomen is soft.  Disposition: Home he does not want home health services      Signed: Seferino Oscar L   03/31/2017, 9:16 AM

## 2017-03-31 NOTE — Care Management Note (Signed)
Case Management Note  Patient Details  Name: JAECE DUCHARME MRN: [id number] Date of Birth: [birth year]  Expected Discharge Date:  03/31/17               Expected Discharge Plan:  Home/Self Care  In-House Referral:  NA, Nutrition  Discharge planning Services  CM Consult  Post Acute Care Choice:  NA Choice offered to:  NA  Status of Service:  Completed, signed off  If discussed at [place] of Stay Meetings, dates discussed:  03/31/2017  Additional Comments: Pt discharging home today with self care. He is enrolled in emmi transition calls. Pt aware. He states he will weigh himself daily and be more mindful of his diet.   Sherald Barge, RN 03/31/2017, 9:26 AM

## 2017-03-31 NOTE — Care Management Important Message (Signed)
Important Message  Patient Details  Name: [name] MRN: [id number] Date of Birth: May 03, 1956   Medicare Important Message Given:  Yes    Sherald Barge, RN 03/31/2017, 9:26 AM

## 2017-04-01 ENCOUNTER — Encounter: Payer: Self-pay | Admitting: *Deleted

## 2017-04-01 ENCOUNTER — Telehealth: Payer: Self-pay | Admitting: *Deleted

## 2017-04-01 ENCOUNTER — Other Ambulatory Visit: Payer: Self-pay | Admitting: *Deleted

## 2017-04-01 NOTE — Patient Outreach (Signed)
[facility]) Care Management  04/01/2017  [name] 09-01-1956 [id number]   Transition of Care Referral  Referral Date: 04/01/17 Referral Source: [employer] Date of Discharge: 03/31/17 Facility: [facility] Discharge Diagnosis: Acute on Chronic CHF Insurance: [facility]  Outreach attempt #1 to patient. No answer. RN CM left HIPAA compliant message along with contact info.   Plan: RN CM will contact patient within 1 business days.  [name], RN, BSN, MHA/MSL, [place] Telephonic Care Manager Coordinator [facility] Direct Phone: [phone] Toll Free: [phone] Fax: [phone]

## 2017-04-01 NOTE — Telephone Encounter (Signed)
[name] called stating that he was recently in the hospital. States that he was taken off of coumdin.

## 2017-04-04 ENCOUNTER — Other Ambulatory Visit: Payer: Self-pay | Admitting: *Deleted

## 2017-04-04 ENCOUNTER — Ambulatory Visit: Payer: Self-pay | Admitting: *Deleted

## 2017-04-04 ENCOUNTER — Telehealth: Payer: Self-pay | Admitting: *Deleted

## 2017-04-04 DIAGNOSIS — Z5181 Encounter for therapeutic drug level monitoring: Secondary | ICD-10-CM

## 2017-04-04 NOTE — Patient Outreach (Signed)
[facility]) Care Management  04/04/2017  [name] 12/18/1955 [id number]   Transition of Care Referral  Referral Date: 04/01/17 Referral Source: [employer] Date of Discharge: 03/31/17 Facility: [facility] Discharge Diagnosis: Acute on Chronic CHF Insurance: [facility]  Outreach attempt # 2 to patient. No answer. RN CM left HIPAA compliant message along with contact info.  Plan: RN CM will contact patient within 3 business days.  [name], RN, BSN, MHA/MSL, [place] Telephonic Care Manager Coordinator [facility] Direct Phone: [phone] Toll Free: [phone] Fax: [phone]

## 2017-04-04 NOTE — Patient Outreach (Signed)
[facility]) Care Management  04/04/2017  [name] January 05, 1956 [id number]   Transition of Care Referral  Referral Date: 04/01/17 Referral Source: [employer] Date of Discharge: 03/31/17 Facility: [facility] Discharge Diagnosis: Acute on Chronic CHF Insurance: [facility] Medicare  EMMI-Heart Failure RED ON EMMI ALERT DAY#: 2 DATE: 04/03/17 RED ALERT: Weighed themselves today? No Any new problems? Yes New/Worsening problems? Yes Other symptoms/problems? Yes  Outreach attempt # 3 spoke with patient regarding referral from [facility]. Questions related to EMMI automated calls addressed during phone call. HIPAA verified with patient.    Social: Patient lives with his wife.  He is independent with ADLs. He is capable of transporting himself to medical appointments that are within his residence. His brother transports him to appointments out of town. Patient declined home health services before being discharged from the hospital.   Conditions: Past Medical Hx: CHF, COPD, ASCVD, DM, Colon Cancer, HTN, HLD, Anemia, Afib Patient rated his health as poor. Patient spoke about his medical conditions. He is not weighing daily. Educated the patient about the importance of weighing daily and documenting his weight. Educated patient to notify his MD with weight changes.  He voiced concerned about his Coumadin being stopped before discharging from the hospital. Patient called his cardiologist's office about his Coumadin, however he was told to "call back". Patient is planning to contact the cardiologist's office today (04/04/17).   Medications: Patient reported greater taking 10 meds per day. Patient reported being able to afford his medications and taking them as prescribed. Patient had no questions or concerns about his meds.   Consent: [facility] services reviewed and discussed with [name]. Verbal consent given for services not obtained.  Plan: RN CM will notify [facility] CM administrative  assistant regarding case closure. RN CM advised patient to contact RN CM for any needs or concerns. RN CM advised patient to alert MD for any changes in conditions.  RN CM will send MD case closure letter. RM CM will send unsuccessful outreach letter to patient.   [name] Bells, RN, BSN, MHA/MSL, [place] Telephonic Care Manager Coordinator [facility] Direct Phone: [phone] Toll Free: [phone] Fax: [phone]

## 2017-04-04 NOTE — Telephone Encounter (Signed)
Please call patient in regards to Coumadin instructions post hospital / tg

## 2017-04-04 NOTE — Telephone Encounter (Signed)
This encounter was created in error - please disregard.

## 2017-04-04 NOTE — Telephone Encounter (Signed)
[name], Pt wants to know if he should start back on coumadin yet.  Please advise.  Does not have f/u with GI until August. Lattie Haw

## 2017-04-05 ENCOUNTER — Encounter: Payer: Self-pay | Admitting: *Deleted

## 2017-04-05 ENCOUNTER — Telehealth: Payer: Self-pay | Admitting: *Deleted

## 2017-04-05 ENCOUNTER — Other Ambulatory Visit: Payer: Self-pay | Admitting: *Deleted

## 2017-04-05 NOTE — Telephone Encounter (Signed)
Reviewed notes from GI. They are ok with him restarting coumadin.

## 2017-04-05 NOTE — Telephone Encounter (Signed)
OK for pt to restart coumadin.  Pt will start coumadin tonight taking 8mg  daily except 12mg  on Wednesdays.  INR appt made for 04/18/17.  He verbalized understanding.

## 2017-04-05 NOTE — Telephone Encounter (Signed)
I spoke with pt and informed him GI was OK with him starting coumadin,he will speak to Edrick Oh RN, Coumadin nurse for his dosing

## 2017-04-05 NOTE — Patient Outreach (Signed)
[facility]) Care Management  04/05/2017  [name] 14-Jan-1956 [id number]  Care Coordination  Phone call placed to Dr. Miguel Rota office regarding a follow-up appointment for patient after being discharged from the hospital. Jacqlyn Larsen stated, she will contact patient to arrange a follow-up appointment.   [name], RN, BSN, MHA/MSL, [place] Telephonic Care Manager Coordinator [facility] Direct Phone: [phone] Toll Free: [phone] Fax: [phone]

## 2017-04-09 ENCOUNTER — Telehealth: Payer: Self-pay | Admitting: Gastroenterology

## 2017-04-09 NOTE — Telephone Encounter (Signed)
Please call pt.HIS ESOPHAGUS SHOWS BARRETT'S ESOPHAGUS. THERE WERE SOME ATYPICAL CELLS. His stomacH/SMALL BOWEL BIOPSIES shows gastritis & DUODENITIS. HIS COLON BIOPSY SHOWS BENIGN CHANGES AT HIS ANASTOMOSIS. HE HAD ONE HYPERPLASTIC POLYP REMOVED.  CONTINUE PROTONIX. TAKE 30 MINUTES PRIOR TO BREAKFAST. NEXT OPV IN AUG 2018. NEEDS CBC/FERRITIN 1 WEEK PRIOR TO OPV. REPEAT EGD IN JUN 2019. NEXT COLONOSCOPY IN 10 YEARS.

## 2017-04-11 ENCOUNTER — Other Ambulatory Visit: Payer: Self-pay

## 2017-04-11 DIAGNOSIS — D649 Anemia, unspecified: Secondary | ICD-10-CM

## 2017-04-11 NOTE — Telephone Encounter (Signed)
Pt is aware and lab orders on file for May 23, 2017.

## 2017-04-11 NOTE — Telephone Encounter (Signed)
APPT MADE AND ON RECALL  °

## 2017-04-18 ENCOUNTER — Ambulatory Visit (INDEPENDENT_AMBULATORY_CARE_PROVIDER_SITE_OTHER): Payer: Medicare HMO | Admitting: *Deleted

## 2017-04-18 ENCOUNTER — Other Ambulatory Visit: Payer: Self-pay | Admitting: *Deleted

## 2017-04-18 DIAGNOSIS — I4891 Unspecified atrial fibrillation: Secondary | ICD-10-CM | POA: Diagnosis not present

## 2017-04-18 DIAGNOSIS — Z5181 Encounter for therapeutic drug level monitoring: Secondary | ICD-10-CM

## 2017-04-18 LAB — POCT INR: INR: 2.4

## 2017-04-18 NOTE — Patient Outreach (Signed)
[facility]) Care Management  04/14/17  [name] 1956-09-23 [id number]  EMMI-Heart Failure RED ON EMMI ALERT DAY#: 12 DATE: 04/13/17 RED ALERT: Weighed themselves today? Yes Weight (lbs) 179   Outreach #1, spoke with patient. Reviewed and addressed red alert. HIPAA verified with patient. Patient reported, his weight varies from 177 to 179. He reported a weight of 178 for 04/13/17. He stated, he was not concerned with his weight. He denied any signs/symptoms of fluid overload. He acknowledged knowing when to call the doctor with any concerns/problems.   Patient verbalized, he did not have a scheduled hospital discharge appointment. RN CM notified patient's PCP office on 04/05/17 that patient needed a scheduled appointment.   Care Coordination  Phone call made to [facility] to contact patient to schedule an appointment. Becky stated, she would contact patient after ending telephone call with RN CM [place]).  Phone call made to [facility], spoke with Coralyn Mark. Coralyn Mark stated, she would contact patient to arrange a f/u hospital appointment.  [name], RN, BSN, MHA/MSL, [place] Telephonic Care Manager Coordinator [facility] Direct Phone: [phone] Toll Free: [phone] Fax: [phone]

## 2017-04-27 ENCOUNTER — Other Ambulatory Visit (HOSPITAL_COMMUNITY)
Admission: RE | Admit: 2017-04-27 | Discharge: 2017-04-27 | Disposition: A | Discharge status: Home / Self Care | Payer: Medicare HMO | Source: Ambulatory Visit | Attending: Student | Admitting: Student

## 2017-04-27 ENCOUNTER — Ambulatory Visit (INDEPENDENT_AMBULATORY_CARE_PROVIDER_SITE_OTHER): Payer: Medicare HMO | Admitting: Student

## 2017-04-27 ENCOUNTER — Encounter: Payer: Self-pay | Admitting: Student

## 2017-04-27 VITALS — BP 106/62 | HR 66 | Ht 72.0 in | Wt 183.0 lb

## 2017-04-27 DIAGNOSIS — I10 Essential (primary) hypertension: Secondary | ICD-10-CM | POA: Diagnosis not present

## 2017-04-27 DIAGNOSIS — I5042 Chronic combined systolic (congestive) and diastolic (congestive) heart failure: Secondary | ICD-10-CM | POA: Diagnosis not present

## 2017-04-27 DIAGNOSIS — D649 Anemia, unspecified: Secondary | ICD-10-CM

## 2017-04-27 DIAGNOSIS — Z720 Tobacco use: Secondary | ICD-10-CM

## 2017-04-27 DIAGNOSIS — I2510 Atherosclerotic heart disease of native coronary artery without angina pectoris: Secondary | ICD-10-CM

## 2017-04-27 DIAGNOSIS — Z79899 Other long term (current) drug therapy: Secondary | ICD-10-CM

## 2017-04-27 DIAGNOSIS — I255 Ischemic cardiomyopathy: Secondary | ICD-10-CM | POA: Diagnosis not present

## 2017-04-27 DIAGNOSIS — I480 Paroxysmal atrial fibrillation: Secondary | ICD-10-CM | POA: Diagnosis not present

## 2017-04-27 LAB — CBC WITH DIFFERENTIAL/PLATELET
Basophils Absolute: 0.1 10*3/uL (ref 0.0–0.1)
Basophils Relative: 1 %
EOS ABS: 0.2 10*3/uL (ref 0.0–0.7)
Eosinophils Relative: 4 %
HCT: 35.4 % — ABNORMAL LOW (ref 39.0–52.0)
Hemoglobin: 11.1 g/dL — ABNORMAL LOW (ref 13.0–17.0)
Lymphocytes Relative: 21 %
Lymphs Abs: 1.2 10*3/uL (ref 0.7–4.0)
MCH: 25.2 pg — ABNORMAL LOW (ref 26.0–34.0)
MCHC: 31.4 g/dL (ref 30.0–36.0)
MCV: 80.5 fL (ref 78.0–100.0)
MONO ABS: 0.3 10*3/uL (ref 0.1–1.0)
MONOS PCT: 5 %
Neutro Abs: 4.2 10*3/uL (ref 1.7–7.7)
Neutrophils Relative %: 70 %
Platelets: 131 10*3/uL — ABNORMAL LOW (ref 150–400)
RBC: 4.4 MIL/uL (ref 4.22–5.81)
RDW: 17.8 % — AB (ref 11.5–15.5)
WBC: 6.1 10*3/uL (ref 4.0–10.5)

## 2017-04-27 LAB — BASIC METABOLIC PANEL
Anion gap: 9 (ref 5–15)
BUN: 25 mg/dL — ABNORMAL HIGH (ref 6–20)
CALCIUM: 9.3 mg/dL (ref 8.9–10.3)
CO2: 29 mmol/L (ref 22–32)
CREATININE: 1.69 mg/dL — AB (ref 0.61–1.24)
Chloride: 95 mmol/L — ABNORMAL LOW (ref 101–111)
GFR calc non Af Amer: 42 mL/min — ABNORMAL LOW (ref >60)
GFR, EST AFRICAN AMERICAN: 49 mL/min — AB (ref >60)
GLUCOSE: 432 mg/dL — AB (ref 65–99)
Potassium: 4.8 mmol/L (ref 3.5–5.1)
Sodium: 133 mmol/L — ABNORMAL LOW (ref 135–145)

## 2017-04-27 MED ORDER — FUROSEMIDE 20 MG PO TABS: ORAL_TABLET | ORAL | 3 refills | Status: DC

## 2017-04-27 NOTE — Patient Instructions (Signed)
Medication Instructions:  Your physician has recommended you make the following change in your medication:  Decrease Lasix to 40 mg in the AM and 20 mg in the Evening   Stop Taking Potassium   Labwork: Your physician recommends that you return for lab work in: Today    Testing/Procedures: None  Follow-Up: Your physician recommends that you schedule a follow-up appointment in: 3 Months    Any Other Special Instructions Will Be Listed Below (If Applicable).     If you need a refill on your cardiac medications before your next appointment, please call your pharmacy.

## 2017-04-27 NOTE — Progress Notes (Signed)
Cardiology Office Note    Date:  04/27/2017   ID:  [name], [name] 01-10-1956, MRN [id number]  PCP:  Sinda Du, MD  Cardiologist: Dr. Lovena Le   Chief Complaint  Patient presents with  . Hospitalization Follow-up    History of Present Illness:    [name] is a 61 y.o. male with past medical history of CAD (s/p stent placement to LAD in 3244), chronic systolic CHF (EF 01-02% by echo in 03/2017), CHB (s/p  BiV AICD placement in 02/2013), PAF (on Coumadin), HTN, HLD, Type 2 DM, and continued tobacco use who presents to the office today for hospital follow-up.   He was last examined by [name] Sims, NP on 03/25/2017 and reported a 12 lb weight gain over the past 3 weeks with associated dyspnea on exertion, PND, and lower extremity edema. He was initially going to be treated as an outpatient but labs showed a BNP of 1210 with Hgb of 7.7, therefore hospital admission was recommended for acute management of his symptoms. He was diuresed with IV Lasix. GI was consulted in the setting of his anemia. Flex Sig showed Intermittent rectal bleeding due to ischemic erosion and hemorrhoids. EGD with probable Barrett's Esophagus and anemia secondary to gastritis and duodenitis. GI recommended repeat Flex Sig in 3 years and it was safe to resume Coumadin from a GI perspective. He was restarted on his home dosing of Lasix 40mg  AM and 20mg  PM at the time of hospital discharge (weight 194 lbs on 6/28).   In talking with the patient today, he reports overall doing well since his recent hospitalization. He denies any recent dyspnea on exertion, orthopnea, PND, or lower extremity edema. He has been weighing himself daily and reports weights have continued to decline. He has been consuming a low-sodium diet but does consume 4-8 cans of Diet Mt. Dew per day.   No recent chest pain or palpitations. He has resumed his Coumadin dosing and denies any recent melena or hematochezia.   Past Medical  History:  Diagnosis Date  . Anemia, normocytic normochromic 2008   2008  . Arteriosclerotic cardiovascular disease (ASCVD)    Acute MI in 2006->  LAD stent, EF-37%  . Arthritis    Hx: of in hands  . Automatic implantable cardioverter-defibrillator in situ   . Carpal tunnel syndrome   . Chronic systolic (congestive) heart failure (HCC)    a. EF 20-25% by echo in 03/2017  . Colon cancer ([place]) 2006   Partial colectomy in 2006  . Complete heart block ([place])    S/P ICD 02/09/13  . COPD (chronic obstructive pulmonary disease) ([place])   . Coronary artery disease   . Diabetes mellitus, type II ([place])    With peripheral neuropathy  . Edema    venous insufficiency  . H/O alcohol dependence ([place])   . H/O hiatal hernia   . Hyperlipidemia   . Hypertension   . Myocardial infarction [facility])    Hx: of 2006  . Neuropathy in diabetes ([place])    Hx: of  . Pacemaker   . Pneumonia 01/2012   01/2012; subsequent admission for chest pain  . Shortness of breath    Hx: of   . Tobacco abuse     Past Surgical History:  Procedure Laterality Date  . ANKLE SURGERY     Right  . Biventricular ICD Placement  02/09/2013   Dr. Lovena Le  . COLONOSCOPY  08/17/2005   Pancolonic diverticula/Multiple rectal polyps removed as described above.  The larger of the two likely responsible for intermittent hematochezia/ Multiple polyps at the hepatic flexure resected with a snare./ Large polypoid lesion growing out of the base of the cecum, just behind the  ileocecal valve. This was not felt to be amendable to endoscopic resection. Biopsied multiple times  . CORONARY ANGIOPLASTY WITH STENT PLACEMENT    . ESOPHAGOGASTRODUODENOSCOPY N/A 03/29/2017   Procedure: ESOPHAGOGASTRODUODENOSCOPY (EGD);  Surgeon: [name] Binder, MD;  [facility];  Service: Endoscopy;  Laterality: N/A;  . FLEXIBLE SIGMOIDOSCOPY    06/13/2006   Essentially normal residual rectum status post total colectomy. anastomosis at 25cm.  Focal area of  abnormality adjacent to suture, as described above, likely not significant, suspect granulation tissue, biopsied.  Anastomosis otherwise appeared normal.  Distal terminal ileum appeared normal  . FLEXIBLE SIGMOIDOSCOPY N/A 01/13/2015   Dr. Gala Romney: normal-appearing residual rectum, surgical anastomosis, s/p subtotal colectomy. Surveillance 5 years   . FLEXIBLE SIGMOIDOSCOPY N/A 03/29/2017   Procedure: FLEXIBLE SIGMOIDOSCOPY;  Surgeon: [name] Binder, MD;  [facility];  Service: Endoscopy;  Laterality: N/A;  . INGUINAL HERNIA REPAIR    . MULTIPLE EXTRACTIONS WITH ALVEOLOPLASTY N/A 04/09/2013   Procedure: Extraction of tooth #'s 1,2,4,5,6,7,8,9,10,11,12,13,17,18,20,21,22,23,27,28, 29,30,31, and 32 with alveoloplasty.;  Surgeon: [name], DDS;  Location: [place];  Service: Oral Surgery;  Laterality: N/A;  . PERMANENT PACEMAKER INSERTION N/A 02/09/2013   Procedure: PERMANENT PACEMAKER INSERTION;  Surgeon: [name] Lance, MD;  Location: [facility] CATH LAB;  Service: Cardiovascular;  Laterality: N/A;  . SUBTOTAL COLECTOMY  09/01/2005   subtotal colectomy  . TEMPORARY PACEMAKER INSERTION Right 02/09/2013   Procedure: TEMPORARY PACEMAKER INSERTION;  Surgeon: [name] Grooms, MD;  Location: [facility] CATH LAB;  Service: Cardiovascular;  Laterality: Right;    Current Medications: Outpatient Medications Prior to Visit  Medication Sig Dispense Refill  . acetaminophen (TYLENOL) 500 MG tablet Take 1 tablet by mouth as needed.    [name] albuterol (PROVENTIL HFA;VENTOLIN HFA) 108 (90 BASE) MCG/ACT inhaler Inhale 2 puffs into the lungs every 6 (six) hours as needed. For shortness of breath    . albuterol (PROVENTIL) (2.5 MG/3ML) 0.083% nebulizer solution Take 2.5 mg by nebulization every 6 (six) hours as needed. For shortness of breath    . carvedilol (COREG) 6.25 MG tablet Take 6.25 mg by mouth 2 (two) times daily with a meal.     . gabapentin (NEURONTIN) 300 MG capsule Take 300 mg by mouth at bedtime.    . insulin  glargine (LANTUS) 100 UNIT/ML injection Inject 20 Units into the skin 2 (two) times daily.     [name] lisinopril (PRINIVIL,ZESTRIL) 10 MG tablet Take 1 tablet by mouth daily.    [name] loratadine (CLARITIN) 10 MG tablet Take 10 mg by mouth daily as needed for allergies. As needed    . magnesium oxide (MAG-OX) 400 MG tablet Take 400 mg by mouth 2 (two) times daily.    . metFORMIN (GLUCOPHAGE) 500 MG tablet Take 1,000 mg by mouth 2 (two) times daily with a meal.    . metolazone (ZAROXOLYN) 2.5 MG tablet Take 2.5 mg on Sunday and Wednesday ONLY (Patient taking differently: Take 2.5 mg by mouth 2 (two) times a week. Take 2.5 mg on Sunday and Wednesday ONLY) 24 tablet 3  . Multiple Vitamin (MULTIVITAMIN) tablet Take 1 tablet by mouth daily.    . nitroGLYCERIN (NITROSTAT) 0.4 MG SL tablet Place 0.4 mg under the tongue every 5 (five) minutes x 3 doses as  needed. For chest pain    . ondansetron (ZOFRAN) 4 MG tablet Take 4 mg by mouth 4 (four) times daily as needed for nausea or vomiting.    . pantoprazole (PROTONIX) 40 MG tablet Take 1 tablet (40 mg total) by mouth daily before breakfast. 30 tablet 12  . sertraline (ZOLOFT) 50 MG tablet Take 50 mg by mouth daily.    . simvastatin (ZOCOR) 80 MG tablet Take 40 mg by mouth at bedtime.    [name] tiZANidine (ZANAFLEX) 4 MG tablet Take 4 mg by mouth 3 (three) times daily.    . traMADol (ULTRAM) 50 MG tablet Take 50-100 mg by mouth every 4 (four) hours as needed.     . furosemide (LASIX) 40 MG tablet Take 1 tablet (40 mg total) by mouth 2 (two) times daily. (Patient taking differently: Take 20-40 mg by mouth See admin instructions. 40mg  in the morning and 20mg  in the evening) 180 tablet 3  . potassium chloride SA (K-DUR,KLOR-CON) 20 MEQ tablet Take 1 tablet (20 mEq total) by mouth daily. 90 tablet 3   No facility-administered medications prior to visit.      Allergies:   Vancomycin   Social History   Social History  . Marital status: Married    Spouse name: N/A  .  Number of children: 1  . Years of education: N/A   Occupational History  . Parts Manager     Disabled   Social History Main Topics  . Smoking status: Current Every Day Smoker    Packs/day: 1.00    Years: 40.00    Types: Cigarettes    Start date: 07/13/1971  . Smokeless tobacco: Never Used  . Alcohol use No     Comment: Quit 2005 from a 6 pack a day use  . Drug use: No  . Sexual activity: No   Other Topics Concern  . None   Social History Narrative  . None     Family History:  The patient's family history includes Colon cancer in his mother; Diabetes in his brother; Heart disease in his brother, father, and mother; Hypertension in his brother.   Review of Systems:   Please see the history of present illness.     General:  No chills, fever, night sweats or weight changes. Cardiovascular:  No chest pain, orthopnea, palpitations, paroxysmal nocturnal dyspnea. Positive for edema and dyspnea on exertion (now improved).  Dermatological: No rash, lesions/masses Respiratory: No cough, dyspnea Urologic: No hematuria, dysuria Abdominal:   No nausea, vomiting, diarrhea, bright red blood per rectum, melena, or hematemesis Neurologic:  No visual changes, wkns, changes in mental status. All other systems reviewed and are otherwise negative except as noted above.   Physical Exam:    VS:  BP 106/62   Pulse 66   Ht 6' (1.829 m)   Wt 183 lb (83 kg)   SpO2 94%   BMI 24.82 kg/m    General: Well developed, well nourished Caucasian male appearing in no acute distress. Head: Normocephalic, atraumatic, sclera non-icteric, no xanthomas, nares are without discharge.  Neck: No carotid bruits. JVD not elevated.  Lungs: Respirations regular and unlabored, without wheezes or rales.  Heart: Regular rate and rhythm. No S3 or S4.  No murmur, no rubs, or gallops appreciated. Abdomen: Soft, non-tender, non-distended with normoactive bowel sounds. No hepatomegaly. No rebound/guarding. No obvious  abdominal masses. Msk:  Strength and tone appear normal for age. No joint deformities or effusions. Extremities: No clubbing or cyanosis. No lower extremity  edema.  Distal pedal pulses are 2+ bilaterally. Neuro: Alert and oriented X 3. Moves all extremities spontaneously. No focal deficits noted. Psych:  Responds to questions appropriately with a normal affect. Skin: No rashes or lesions noted  Wt Readings from Last 3 Encounters:  04/27/17 183 lb (83 kg)  03/31/17 194 lb 8 oz (88.2 kg)  03/25/17 191 lb (86.6 kg)     Studies/Labs Reviewed:   EKG:  EKG is not ordered today.  Recent Labs: 03/25/2017: B Natriuretic Peptide 1,210.0 04/27/2017: BUN 25; Creatinine, Ser 1.69; Hemoglobin 11.1; Platelets 131; Potassium 4.8; Sodium 133   Lipid Panel    Component Value Date/Time   CHOL 108 01/27/2014 0623   TRIG 100 01/27/2014 0623   HDL 46 01/27/2014 0623   CHOLHDL 2.3 01/27/2014 0623   VLDL 20 01/27/2014 0623   LDLCALC 42 01/27/2014 0623    Additional studies/ records that were reviewed today include:   Echocardiogram: 03/26/2017 Study Conclusions  - Left ventricle: The cavity size was severely dilated. Systolic   function was severely reduced. The estimated ejection fraction   was in the range of 20% to 25%. Diffuse hypokinesis. Akinesis of   the apicalinferior and apical myocardium. Features are consistent   with a pseudonormal left ventricular filling pattern, with   concomitant abnormal relaxation and increased filling pressure   (grade 2 diastolic dysfunction). Doppler parameters are   consistent with high ventricular filling pressure. - Aortic valve: Transvalvular velocity was within the normal range.   There was no stenosis. There was mild regurgitation. - Mitral valve: Transvalvular velocity was within the normal range.   There was no evidence for stenosis. There was mild regurgitation. - Left atrium: The atrium was severely dilated. - Right ventricle: The cavity size  was mildly dilated. Wall   thickness was normal. Systolic function was normal. - Tricuspid valve: There was trivial regurgitation. - Pulmonary arteries: Systolic pressure was mildly increased. PA   peak pressure: 41 mm Hg (S).  Assessment:    1. Chronic combined systolic and diastolic heart failure ([place])   2. Ischemic cardiomyopathy   3. Coronary artery disease involving native coronary artery of native heart without angina pectoris   4. Paroxysmal atrial fibrillation (HCC)   5. Essential hypertension   6. Medication management   7. Tobacco use   8. Anemia, unspecified type      Plan:   In order of problems listed above:  1. Chronic Combined Systolic and Diastolic CHF - echo in 69/6295 showed a reduced EF of 20-25% with Grade 2 DD.  - volume status has significantly improved since his last office visit. Weight has declined from 194 lbs at the time of hospital discharge to 183 lbs today. He does not appear volume overloaded by physical examination.  - he is following a low-sodium diet but consumes 4-8 cans of soda per day. I strongly recommended reducing his soda intake but he said "that is not something he can do".  - will reduce Lasix dosing back to baseline dosing of 40mg  in AM/20mg  in PM and continue with Metolazone 2.5mg  twice weekly. Will stop K+ at this time as it was 5.4 when checked on 6/27. Recheck BMET today.  - continue Coreg 6.25mg  BID and Lisinopril 10mg  daily.   2. Ischemic Cardiomyopathy - s/p BiV AICD placement in 02/2013. - followed by Dr. Lovena Le. Most recent device interrogation in 03/2017 showed appropriate histograms with stable leads and battery life.   3. CAD - s/p stent placement to  LAD in 2006. - he denies any recent chest pain or dyspnea on exertion. - continue BB and statin therapy. No ASA secondary to the need for Coumadin.  4. PAF - he denies any recent palpitations. - continue Coreg 6.25mg  BID for rate control and Coumadin for anticoagulation.    5. HTN - BP is well-controlled at 106/62 during today's visit.  - continue Coreg 6.25mg  BID and Lisinopril 10mg  daily.   6. Tobacco Use - he has cut down to smoking 0.5 ppd. Congratulated on this with complete cessation advised.   7. Normocytic Anemia - found to have a Hgb of 7.7 during recent admission with Flex Sig showing intermittent rectal bleeding due to ischemic erosion and hemorrhoids. EGD with probable Barrett's Esophagus and anemia secondary to gastritis and duodenitis. - deemed safe to resume Coumadin from a GI perspective. He denies any recent melena or hematochezia.  - continued follow-up with GI recommended.    Medication Adjustments/Labs and Tests Ordered: Current medicines are reviewed at length with the patient today.  Concerns regarding medicines are outlined above.  Medication changes, Labs and Tests ordered today are listed in the Patient Instructions below. Patient Instructions  Medication Instructions:  Your physician has recommended you make the following change in your medication:  Decrease Lasix to 40 mg in the AM and 20 mg in the Evening   Stop Taking Potassium   Labwork: Your physician recommends that you return for lab work in: Today    Testing/Procedures: None  Follow-Up: Your physician recommends that you schedule a follow-up appointment in: 3 Months   Any Other Special Instructions Will Be Listed Below (If Applicable).  If you need a refill on your cardiac medications before your next appointment, please call your pharmacy.    Signed, Erma Heritage, PA-C  04/27/2017 5:12 PM    [facility] [place], [place], [place]  [address] Phone: [phone]; Fax: [phone]  [address] [place], [place] [address] Phone: [phone]

## 2017-05-04 ENCOUNTER — Other Ambulatory Visit: Payer: Self-pay

## 2017-05-04 DIAGNOSIS — D649 Anemia, unspecified: Secondary | ICD-10-CM

## 2017-05-09 ENCOUNTER — Ambulatory Visit (INDEPENDENT_AMBULATORY_CARE_PROVIDER_SITE_OTHER): Payer: Medicare HMO | Admitting: *Deleted

## 2017-05-09 DIAGNOSIS — I25119 Atherosclerotic heart disease of native coronary artery with unspecified angina pectoris: Secondary | ICD-10-CM | POA: Diagnosis not present

## 2017-05-09 DIAGNOSIS — Z5181 Encounter for therapeutic drug level monitoring: Secondary | ICD-10-CM | POA: Diagnosis not present

## 2017-05-09 DIAGNOSIS — E1165 Type 2 diabetes mellitus with hyperglycemia: Secondary | ICD-10-CM | POA: Diagnosis not present

## 2017-05-09 DIAGNOSIS — J449 Chronic obstructive pulmonary disease, unspecified: Secondary | ICD-10-CM | POA: Diagnosis not present

## 2017-05-09 DIAGNOSIS — I5042 Chronic combined systolic (congestive) and diastolic (congestive) heart failure: Secondary | ICD-10-CM | POA: Diagnosis not present

## 2017-05-09 DIAGNOSIS — I4891 Unspecified atrial fibrillation: Secondary | ICD-10-CM

## 2017-05-09 LAB — POCT INR: INR: 1.9

## 2017-05-30 ENCOUNTER — Ambulatory Visit: Payer: Medicare HMO | Admitting: Gastroenterology

## 2017-05-30 ENCOUNTER — Other Ambulatory Visit: Payer: Self-pay | Admitting: Adult Health

## 2017-06-09 ENCOUNTER — Ambulatory Visit (INDEPENDENT_AMBULATORY_CARE_PROVIDER_SITE_OTHER): Payer: Medicare HMO | Admitting: *Deleted

## 2017-06-09 ENCOUNTER — Telehealth: Payer: Self-pay | Admitting: Cardiology

## 2017-06-09 DIAGNOSIS — I255 Ischemic cardiomyopathy: Secondary | ICD-10-CM | POA: Diagnosis not present

## 2017-06-09 NOTE — Telephone Encounter (Signed)
Spoke with pt and reminded pt of remote transmission that is due today. Pt verbalized understanding.   

## 2017-06-10 NOTE — Progress Notes (Signed)
Remote ICD transmission.   

## 2017-06-14 ENCOUNTER — Encounter: Payer: Self-pay | Admitting: Cardiology

## 2017-06-15 ENCOUNTER — Ambulatory Visit (INDEPENDENT_AMBULATORY_CARE_PROVIDER_SITE_OTHER): Payer: Medicare HMO | Admitting: *Deleted

## 2017-06-15 DIAGNOSIS — I4891 Unspecified atrial fibrillation: Secondary | ICD-10-CM

## 2017-06-15 DIAGNOSIS — Z5181 Encounter for therapeutic drug level monitoring: Secondary | ICD-10-CM

## 2017-06-15 DIAGNOSIS — I480 Paroxysmal atrial fibrillation: Secondary | ICD-10-CM

## 2017-06-15 LAB — POCT INR: INR: 1.9

## 2017-06-29 ENCOUNTER — Ambulatory Visit (INDEPENDENT_AMBULATORY_CARE_PROVIDER_SITE_OTHER): Payer: Medicare HMO | Admitting: *Deleted

## 2017-06-29 DIAGNOSIS — Z5181 Encounter for therapeutic drug level monitoring: Secondary | ICD-10-CM | POA: Diagnosis not present

## 2017-06-29 DIAGNOSIS — I4891 Unspecified atrial fibrillation: Secondary | ICD-10-CM | POA: Diagnosis not present

## 2017-06-29 LAB — POCT INR: INR: 2.5

## 2017-06-29 MED ORDER — WARFARIN SODIUM 4 MG PO TABS: ORAL_TABLET | ORAL | 4 refills | Status: DC

## 2017-06-29 NOTE — Addendum Note (Signed)
Addended by: Malen Gauze on: 06/29/2017 08:49 AM   Modules accepted: Orders

## 2017-07-01 LAB — CUP PACEART REMOTE DEVICE CHECK
Battery Remaining Longevity: 72 mo
Battery Remaining Percentage: 100 %
Brady Statistic RA Percent Paced: 27 %
Brady Statistic RV Percent Paced: 99 %
HIGH POWER IMPEDANCE MEASURED VALUE: 84 Ohm
Implantable Lead Implant Date: 20140509
Implantable Lead Location: 753860
Implantable Lead Model: 4555
Implantable Lead Serial Number: 29354050
Implantable Pulse Generator Implant Date: 20140509
Lead Channel Pacing Threshold Amplitude: 0.7 V
Lead Channel Pacing Threshold Amplitude: 0.8 V
Lead Channel Pacing Threshold Amplitude: 0.8 V
Lead Channel Pacing Threshold Pulse Width: 0.4 ms
Lead Channel Pacing Threshold Pulse Width: 1 ms
Lead Channel Setting Pacing Amplitude: 2 V
Lead Channel Setting Pacing Pulse Width: 0.4 ms
Lead Channel Setting Sensing Sensitivity: 0.6 mV
Lead Channel Setting Sensing Sensitivity: 1 mV
MDC IDC LEAD IMPLANT DT: 20140509
MDC IDC LEAD IMPLANT DT: 20140509
MDC IDC LEAD LOCATION: 753858
MDC IDC LEAD LOCATION: 753859
MDC IDC LEAD SERIAL: 118610
MDC IDC LEAD SERIAL: 207706
MDC IDC MSMT LEADCHNL LV IMPEDANCE VALUE: 698 Ohm
MDC IDC MSMT LEADCHNL RA IMPEDANCE VALUE: 678 Ohm
MDC IDC MSMT LEADCHNL RA PACING THRESHOLD PULSEWIDTH: 0.4 ms
MDC IDC MSMT LEADCHNL RV IMPEDANCE VALUE: 725 Ohm
MDC IDC PG SERIAL: 950230
MDC IDC SESS DTM: 20180906144900
MDC IDC SET LEADCHNL LV PACING PULSEWIDTH: 1 ms
MDC IDC SET LEADCHNL RA PACING AMPLITUDE: 2 V
MDC IDC SET LEADCHNL RV PACING AMPLITUDE: 2.5 V

## 2017-07-18 DIAGNOSIS — D649 Anemia, unspecified: Secondary | ICD-10-CM | POA: Diagnosis not present

## 2017-07-19 ENCOUNTER — Other Ambulatory Visit: Payer: Self-pay | Admitting: Adult Health

## 2017-07-19 LAB — CBC WITH DIFFERENTIAL/PLATELET
BASOS PCT: 0.8 %
Basophils Absolute: 48 cells/uL (ref 0–200)
Eosinophils Absolute: 222 cells/uL (ref 15–500)
Eosinophils Relative: 3.7 %
HCT: 36.5 % — ABNORMAL LOW (ref 38.5–50.0)
Hemoglobin: 11.8 g/dL — ABNORMAL LOW (ref 13.2–17.1)
Lymphs Abs: 1482 cells/uL (ref 850–3900)
MCH: 26.9 pg — AB (ref 27.0–33.0)
MCHC: 32.3 g/dL (ref 32.0–36.0)
MCV: 83.3 fL (ref 80.0–100.0)
MONOS PCT: 8.2 %
NEUTROS PCT: 62.6 %
Neutro Abs: 3756 cells/uL (ref 1500–7800)
PLATELETS: 144 10*3/uL (ref 140–400)
RBC: 4.38 10*6/uL (ref 4.20–5.80)
RDW: 17.8 % — AB (ref 11.0–15.0)
TOTAL LYMPHOCYTE: 24.7 %
WBC mixed population: 492 cells/uL (ref 200–950)
WBC: 6 10*3/uL (ref 3.8–10.8)

## 2017-07-19 LAB — FERRITIN: Ferritin: 22 ng/mL (ref 20–380)

## 2017-07-22 ENCOUNTER — Encounter: Payer: Self-pay | Admitting: Gastroenterology

## 2017-07-22 ENCOUNTER — Ambulatory Visit (INDEPENDENT_AMBULATORY_CARE_PROVIDER_SITE_OTHER): Payer: Medicare HMO | Admitting: Gastroenterology

## 2017-07-22 ENCOUNTER — Telehealth: Payer: Self-pay | Admitting: Gastroenterology

## 2017-07-22 VITALS — BP 135/74 | HR 70 | Temp 97.2°F | Ht 72.0 in | Wt 182.6 lb

## 2017-07-22 DIAGNOSIS — R140 Abdominal distension (gaseous): Secondary | ICD-10-CM

## 2017-07-22 DIAGNOSIS — K2270 Barrett's esophagus without dysplasia: Secondary | ICD-10-CM

## 2017-07-22 DIAGNOSIS — D508 Other iron deficiency anemias: Secondary | ICD-10-CM

## 2017-07-22 DIAGNOSIS — D509 Iron deficiency anemia, unspecified: Secondary | ICD-10-CM | POA: Insufficient documentation

## 2017-07-22 DIAGNOSIS — R1013 Epigastric pain: Secondary | ICD-10-CM | POA: Insufficient documentation

## 2017-07-22 NOTE — Progress Notes (Signed)
cc'd to pcp 

## 2017-07-22 NOTE — Progress Notes (Signed)
Referring Provider: Sinda Du, MD Primary Care Physician:  Sinda Du, MD Primary GI: Dr. Gala Romney   Chief Complaint  Patient presents with  . Anemia    f/u  . Abdominal Pain    and bloating w/ eating food x few years    HPI:   [name] is a 61 y.o. male presenting today with a history of colon cancer in 2006 s/p subtotal colectomy. He presented to [facility] with Hgb 7.8, iron studies consistent with IDA. Heme positive. On chronic Coumadin. He underwent flex sig and EGD at time of inpatient stay. Flex sig with erosion at anastomosis s/p APC therapy, hyperplastic polyp. Will need surveillance in 3 years. EGD with Barrett's esophagus and focal glandular atypia. Surveillance in June 2019.   Protonix once daily. On Coumadin. No dysphagia. Feels bloated with eating. Has early satiety. Lower abdominal discomfort after eating. Feels bloated. "Doesn't hurt a lot". Rated as a 2 or 3. More of a bloated feeling. Chronic diarrhea. Takes Imodium as needed. No rectal bleeding. Appetite waxes and wanes. Stays nauseated. No vomiting. Wants to hold off on any evaluation til after Christmas. Taking Zofran once every 4 days. Weight fluctuates after review of epic, ranging from upper 170s to upper 180s. Most recent Hgb 11.8, ferritin low normal in the 20s but improved from admission.   Past Medical History:  Diagnosis Date  . Anemia, normocytic normochromic 2008   2008  . Arteriosclerotic cardiovascular disease (ASCVD)    Acute MI in 2006->  LAD stent, EF-37%  . Arthritis    Hx: of in hands  . Automatic implantable cardioverter-defibrillator in situ   . Carpal tunnel syndrome   . Chronic systolic (congestive) heart failure (HCC)    a. EF 20-25% by echo in 03/2017  . Colon cancer ([place]) 2006   Partial colectomy in 2006  . Complete heart block ([place])    S/P ICD 02/09/13  . COPD (chronic obstructive pulmonary disease) ([place])   . Coronary artery disease   . Diabetes mellitus, type II  ([place])    With peripheral neuropathy  . Edema    venous insufficiency  . H/O alcohol dependence ([place])   . H/O hiatal hernia   . Hyperlipidemia   . Hypertension   . Myocardial infarction [facility])    Hx: of 2006  . Neuropathy in diabetes ([place])    Hx: of  . Pacemaker   . Pneumonia 01/2012   01/2012; subsequent admission for chest pain  . Shortness of breath    Hx: of   . Tobacco abuse     Past Surgical History:  Procedure Laterality Date  . ANKLE SURGERY     Right  . Biventricular ICD Placement  02/09/2013   Dr. Lovena Le  . COLONOSCOPY  08/17/2005   Pancolonic diverticula/Multiple rectal polyps removed as described above. The larger of the two likely responsible for intermittent hematochezia/ Multiple polyps at the hepatic flexure resected with a snare./ Large polypoid lesion growing out of the base of the cecum, just behind the  ileocecal valve. This was not felt to be amendable to endoscopic resection. Biopsied multiple times  . CORONARY ANGIOPLASTY WITH STENT PLACEMENT    . ESOPHAGOGASTRODUODENOSCOPY N/A 03/29/2017   Barrett's esophagus, gastritis, duodenitis, focal glandular atypia on biopsy. Needs surveillance June 2019   . FLEXIBLE SIGMOIDOSCOPY    06/13/2006   Essentially normal residual rectum status post total colectomy. anastomosis at 25cm.  Focal area of abnormality adjacent to suture, as  described above, likely not significant, suspect granulation tissue, biopsied.  Anastomosis otherwise appeared normal.  Distal terminal ileum appeared normal  . FLEXIBLE SIGMOIDOSCOPY N/A 01/13/2015   Dr. Gala Romney: normal-appearing residual rectum, surgical anastomosis, s/p subtotal colectomy. Surveillance 5 years   . FLEXIBLE SIGMOIDOSCOPY N/A 03/29/2017   Dr. Oneida Alar while inpatient: intermittent rectal bleeding due to ischemic erosion at anastomosis,s/p APC therapy. one 3 mm polyp in recum (hyperplastic). surveillance in 3 years  . INGUINAL HERNIA REPAIR    . MULTIPLE EXTRACTIONS WITH ALVEOLOPLASTY  N/A 04/09/2013   Procedure: Extraction of tooth #'s 1,2,4,5,6,7,8,9,10,11,12,13,17,18,20,21,22,23,27,28, 29,30,31, and 32 with alveoloplasty.;  Surgeon: Lenn Cal, DDS;  Location: [place];  Service: Oral Surgery;  Laterality: N/A;  . PERMANENT PACEMAKER INSERTION N/A 02/09/2013   Procedure: PERMANENT PACEMAKER INSERTION;  Surgeon: Evans Lance, MD;  Location[facility] C[facility] LAB;  Service: Cardiovascular;  Laterality: N/A;  . SUBTOTAL COLECTOMY  09/01/2005   subtotal colectomy  . TEMPORARY PACEMAKER INSERTION Right 02/09/2013   Procedure: TEMPORARY PACEMAKER INSERTION;  Surgeon: Sinclair Grooms, MD;  Location: [facility] CATH LAB;  Service: Cardiovascular;  Laterality: Right;    Current Outpatient Prescriptions  Medication Sig Dispense Refill  . acetaminophen (TYLENOL) 500 MG tablet Take 1 tablet by mouth as needed.    [name] albuterol (PROVENTIL HFA;VENTOLIN HFA) 108 (90 BASE) MCG/ACT inhaler Inhale 2 puffs into the lungs every 6 (six) hours as needed. For shortness of breath    . albuterol (PROVENTIL) (2.5 MG/3ML) 0.083% nebulizer solution Take 2.5 mg by nebulization every 6 (six) hours as needed. For shortness of breath    . carvedilol (COREG) 6.25 MG tablet Take 6.25 mg by mouth 2 (two) times daily with a meal.     . furosemide (LASIX) 20 MG tablet Take 40 mg in the AM and 20 mg in the PM 90 tablet 3  . furosemide (LASIX) 40 MG tablet TAKE 1 TABLET IN THE MORNING  AND TAKE 1/2 TABLET IN THE EVENING 135 tablet 3  . gabapentin (NEURONTIN) 300 MG capsule Take 300 mg by mouth at bedtime.    . insulin glargine (LANTUS) 100 UNIT/ML injection Inject 20 Units into the skin 2 (two) times daily.     [name] lisinopril (PRINIVIL,ZESTRIL) 10 MG tablet Take 1 tablet by mouth daily.    [name] loratadine (CLARITIN) 10 MG tablet Take 10 mg by mouth daily as needed for allergies. As needed    . magnesium oxide (MAG-OX) 400 MG tablet Take 400 mg by mouth 2 (two) times daily.    . metFORMIN (GLUCOPHAGE) 500 MG tablet Take 1,000 mg by  mouth 2 (two) times daily with a meal.    . metolazone (ZAROXOLYN) 2.5 MG tablet TAKE 1 TABLET (2.5 MG) ON SUNDAY AND WEDNESDAY ONLY 24 tablet 3  . Multiple Vitamin (MULTIVITAMIN) tablet Take 1 tablet by mouth daily.    . nitroGLYCERIN (NITROSTAT) 0.4 MG SL tablet Place 0.4 mg under the tongue every 5 (five) minutes x 3 doses as needed. For chest pain    . ondansetron (ZOFRAN) 4 MG tablet Take 4 mg by mouth 4 (four) times daily as needed for nausea or vomiting.    . pantoprazole (PROTONIX) 40 MG tablet Take 1 tablet (40 mg total) by mouth daily before breakfast. 30 tablet 12  . sertraline (ZOLOFT) 50 MG tablet Take 50 mg by mouth daily.    . simvastatin (ZOCOR) 80 MG tablet Take 40 mg by mouth at bedtime.    [name] tiZANidine (ZANAFLEX) 4  MG tablet Take 4 mg by mouth 3 (three) times daily.    . traMADol (ULTRAM) 50 MG tablet Take 50-100 mg by mouth every 4 (four) hours as needed.     . warfarin (COUMADIN) 4 MG tablet Take 2 tablets daily except 3 tablets on Wednesdays and Saturdays 200 tablet 4   No current facility-administered medications for this visit.     Allergies as of 07/22/2017 - Review Complete 07/22/2017  Allergen Reaction Noted  . Vancomycin Itching and Rash 01/15/2012    Family History  Problem Relation Age of Onset  . Colon cancer Mother        Bandy Honaker, ?>age 62.  [name] Heart disease Mother   . Heart disease Father   . Hypertension Brother   . Heart disease Brother   . Diabetes Brother   . Liver disease Neg Hx     Social History   Social History  . Marital status: Married    Spouse name: N/A  . Number of children: 1  . Years of education: N/A   Occupational History  . Parts Manager     Disabled   Social History Main Topics  . Smoking status: Current Every Day Smoker    Packs/day: 1.00    Years: 40.00    Types: Cigarettes    Start date: 07/13/1971  . Smokeless tobacco: Never Used  . Alcohol use No     Comment: Quit 2005 from a 6 pack a day use  . Drug use:  No  . Sexual activity: No   Other Topics Concern  . None   Social History Narrative  . None    Review of Systems: Gen: Denies fever, chills, anorexia. Denies fatigue, weakness, weight loss.  CV: Denies chest pain, palpitations, syncope, peripheral edema, and claudication. Resp: Denies dyspnea at rest, cough, wheezing, coughing up blood, and pleurisy. GI: see HPI  Derm: Denies rash, itching, dry skin Psych: Denies depression, anxiety, memory loss, confusion. No homicidal or suicidal ideation.  Heme: Denies bruising, bleeding, and enlarged lymph nodes.  Physical Exam: BP 135/74   Pulse 70   Temp (!) 97.2 F (36.2 C) (Oral)   Ht 6' (1.829 m)   Wt 182 lb 9.6 oz (82.8 kg)   BMI 24.77 kg/m  General:   Alert and oriented. No distress noted. Pleasant and cooperative.  Head:  Normocephalic and atraumatic. Eyes:  Conjuctiva clear without scleral icterus. Mouth:  Oral mucosa pink and moist.  Abdomen:  +BS, soft, non-tender and non-distended. No rebound or guarding. No HSM or masses noted. Msk:  Symmetrical without gross deformities. Normal posture. Extremities:  Without edema. Neurologic:  Alert and  oriented x4 Psych:  Alert and cooperative. Normal mood and affect.  Lab Results  Component Value Date   WBC 6.0 07/18/2017   HGB 11.8 (L) 07/18/2017   HCT 36.5 (L) 07/18/2017   MCV 83.3 07/18/2017   PLT 144 07/18/2017   Lab Results  Component Value Date   IRON 31 (L) 03/25/2017   TIBC 468 (H) 03/25/2017   FERRITIN 22 07/18/2017

## 2017-07-22 NOTE — Telephone Encounter (Signed)
Thanks

## 2017-07-22 NOTE — Telephone Encounter (Signed)
Alicia:  I forgot to mention to patient to start taking iron 325 mg po BID. We need to recheck CBC, iron, ferritin, TIBC in January when he returns to see [place]. We can order it at that visit, but I just want to make sure he knows that we need to follow this.

## 2017-07-22 NOTE — Assessment & Plan Note (Signed)
Inpatient earlier this year with acute on chronic anemia, heme positive stool. Flex sig with erosion at anastomosis s/p APC therapy and hyperplastic polyp, EGD completed with Barrett's. Hgb stable and improved to the 11 range. Ferritin improved as well but still low normal. Will add oral iron BID. Will need to recheck labs in Jan at next visit. If worsening, would recommend imaging (CTE), as he would not be a good candidate for capsule study with pacemaker.

## 2017-07-22 NOTE — Assessment & Plan Note (Signed)
Presenting with postprandial bloating, early satiety, occasional nausea. Tolerating diet. Query delayed stomach emptying in setting of gastroparesis. EGD on file from earlier this month with Barrett's esophagus and gastritis. Continue Protonix once daily. GERD symptoms controlled. Declined GES at this point. No concerning abdominal pain and weight is remaining overall stable.   1. Supportive measures with PPI daily 2. Gastroparesis diet provided 3. Zofran prn 4. Suggest GES when patient is willing 5. Close follow-up in Jan 2019.  6. If any abdominal pain, will need updated abdominal imaging. Patient declining other tests but willing to pursue if conditions changes.

## 2017-07-22 NOTE — Patient Instructions (Addendum)
Your symptoms seem to point to delayed stomach emptying due to history of diabetes. There is a test called a gastric emptying study that we could order. Let me know if you want to pursue this.   We will see you back in January. Please call me if any abdominal pain, fever, chills, weight loss, unable to eat, vomiting.   In the meantime, continue the Protonix once each morning, 30 minutes before breakfast. I have attached a diet sheet that may be helpful. You can take Zofran as needed for nausea.   You will need another upper endoscopy in June 2019.   It was a pleasure to see you today. I strive to create trusting relationships with patients to provide genuine, compassionate, and quality care. I value your feedback. If you receive a survey regarding your visit,  I greatly appreciate you the taking time to fill this out.   Annitta Needs, PhD, ANP-BC [facility]     Gastroparesis Gastroparesis, also called delayed gastric emptying, is a condition in which food takes longer than normal to empty from the stomach. The condition is usually long-lasting (chronic). What are the causes? This condition may be caused by:  An endocrine disorder, such as hypothyroidism or diabetes. Diabetes is the most common cause of this condition.  A nervous system disease, such as Parkinson disease or multiple sclerosis.  Cancer, infection, or surgery of the stomach or vagus nerve.  A connective tissue disorder, such as scleroderma.  Certain medicines.  In most cases, the cause is not known. What increases the risk? This condition is more likely to develop in:  People with certain disorders, including endocrine disorders, eating disorders, amyloidosis, and scleroderma.  People with certain diseases, including Parkinson disease or multiple sclerosis.  People with cancer or infection of the stomach or vagus nerve.  People who have had surgery on the stomach or vagus nerve.  People who  take certain medicines.  Women.  What are the signs or symptoms? Symptoms of this condition include:  An early feeling of fullness when eating.  Nausea.  Weight loss.  Vomiting.  Heartburn.  Abdominal bloating.  Inconsistent blood glucose levels.  Lack of appetite.  Acid from the stomach coming up into the esophagus (gastroesophageal reflux).  Spasms of the stomach.  Symptoms may come and go. How is this diagnosed? This condition is diagnosed with tests, such as:  Tests that check how long it takes food to move through the stomach and intestines. These tests include: ? Upper gastrointestinal (GI) series. In this test, X-rays of the intestines are taken after you drink a liquid. The liquid makes the intestines show up better on the X-rays. ? Gastric emptying scintigraphy. In this test, scans are taken after you eat food that contains a small amount of radioactive material. ? Wireless capsule GI monitoring system. This test involves swallowing a capsule that records information about movement through the stomach.  Gastric manometry. This test measures electrical and muscular activity in the stomach. It is done with a thin tube that is passed down the throat and into the stomach.  Endoscopy. This test checks for abnormalities in the lining of the stomach. It is done with a long, thin tube that is passed down the throat and into the stomach.  An ultrasound. This test can help rule out gallbladder disease or pancreatitis as a cause of your symptoms. It uses sound waves to take pictures of the inside of your body.  How is this treated? There  is no cure for gastroparesis. This condition may be managed with:  Treatment of the underlying condition causing the gastroparesis.  Lifestyle changes, including exercise and dietary changes. Dietary changes can include: ? Changes in what and when you eat. ? Eating smaller meals more often. ? Eating low-fat foods. ? Eating low-fiber  forms of high-fiber foods, such as cooked vegetables instead of raw vegetables. ? Having liquid foods in place of solid foods. Liquid foods are easier to digest.  Medicines. These may be given to control nausea and vomiting and to stimulate stomach muscles.  Getting food through a feeding tube. This may be done in severe cases.  A gastric neurostimulator. This is a device that is inserted into the body with surgery. It helps improve stomach emptying and control nausea and vomiting.  Follow these instructions at home:  Follow your health care provider's instructions about exercise and diet.  Take medicines only as directed by your health care provider. Contact a health care provider if:  Your symptoms do not improve with treatment.  You have new symptoms. Get help right away if:  You have severe abdominal pain that does not improve with treatment.  You have nausea that does not go away.  You cannot keep fluids down. This information is not intended to replace advice given to you by your health care provider. Make sure you discuss any questions you have with your health care provider. Document Released: 09/20/2005 Document Revised: 02/26/2016 Document Reviewed: 09/16/2014 Elsevier Interactive Patient Education  [employer].

## 2017-07-22 NOTE — Assessment & Plan Note (Signed)
Needs surveillance June 2019. Continue Protonix once daily.

## 2017-07-22 NOTE — Telephone Encounter (Signed)
[name], Pt was notified and is aware that he will get labs at his f/u apt 10/2017.

## 2017-07-27 ENCOUNTER — Ambulatory Visit (INDEPENDENT_AMBULATORY_CARE_PROVIDER_SITE_OTHER): Payer: Medicare HMO | Admitting: *Deleted

## 2017-07-27 DIAGNOSIS — Z5181 Encounter for therapeutic drug level monitoring: Secondary | ICD-10-CM

## 2017-07-27 DIAGNOSIS — I4891 Unspecified atrial fibrillation: Secondary | ICD-10-CM

## 2017-07-27 LAB — POCT INR: INR: 4.5

## 2017-08-01 ENCOUNTER — Ambulatory Visit: Payer: Self-pay | Admitting: Adult Health

## 2017-08-03 DIAGNOSIS — J449 Chronic obstructive pulmonary disease, unspecified: Secondary | ICD-10-CM | POA: Diagnosis not present

## 2017-08-03 DIAGNOSIS — Z125 Encounter for screening for malignant neoplasm of prostate: Secondary | ICD-10-CM | POA: Diagnosis not present

## 2017-08-03 DIAGNOSIS — Z0000 Encounter for general adult medical examination without abnormal findings: Secondary | ICD-10-CM | POA: Diagnosis not present

## 2017-08-03 DIAGNOSIS — G609 Hereditary and idiopathic neuropathy, unspecified: Secondary | ICD-10-CM | POA: Diagnosis not present

## 2017-08-03 DIAGNOSIS — E785 Hyperlipidemia, unspecified: Secondary | ICD-10-CM | POA: Diagnosis not present

## 2017-08-03 DIAGNOSIS — I25119 Atherosclerotic heart disease of native coronary artery with unspecified angina pectoris: Secondary | ICD-10-CM | POA: Diagnosis not present

## 2017-08-03 DIAGNOSIS — G5600 Carpal tunnel syndrome, unspecified upper limb: Secondary | ICD-10-CM | POA: Diagnosis not present

## 2017-08-03 DIAGNOSIS — F17200 Nicotine dependence, unspecified, uncomplicated: Secondary | ICD-10-CM | POA: Diagnosis not present

## 2017-08-03 DIAGNOSIS — E1165 Type 2 diabetes mellitus with hyperglycemia: Secondary | ICD-10-CM | POA: Diagnosis not present

## 2017-08-03 DIAGNOSIS — I252 Old myocardial infarction: Secondary | ICD-10-CM | POA: Diagnosis not present

## 2017-08-11 DIAGNOSIS — Z23 Encounter for immunization: Secondary | ICD-10-CM | POA: Diagnosis not present

## 2017-08-11 DIAGNOSIS — Z0000 Encounter for general adult medical examination without abnormal findings: Secondary | ICD-10-CM | POA: Diagnosis not present

## 2017-08-15 ENCOUNTER — Ambulatory Visit (INDEPENDENT_AMBULATORY_CARE_PROVIDER_SITE_OTHER): Payer: Medicare HMO | Admitting: *Deleted

## 2017-08-15 DIAGNOSIS — Z5181 Encounter for therapeutic drug level monitoring: Secondary | ICD-10-CM | POA: Diagnosis not present

## 2017-08-15 DIAGNOSIS — I4891 Unspecified atrial fibrillation: Secondary | ICD-10-CM

## 2017-08-15 LAB — POCT INR: INR: 3.5

## 2017-09-05 ENCOUNTER — Ambulatory Visit (INDEPENDENT_AMBULATORY_CARE_PROVIDER_SITE_OTHER): Payer: Medicare HMO | Admitting: *Deleted

## 2017-09-05 DIAGNOSIS — I4891 Unspecified atrial fibrillation: Secondary | ICD-10-CM

## 2017-09-05 DIAGNOSIS — Z5181 Encounter for therapeutic drug level monitoring: Secondary | ICD-10-CM | POA: Diagnosis not present

## 2017-09-05 LAB — POCT INR: INR: 1.9

## 2017-09-08 ENCOUNTER — Telehealth: Payer: Self-pay | Admitting: Cardiology

## 2017-09-08 ENCOUNTER — Ambulatory Visit (INDEPENDENT_AMBULATORY_CARE_PROVIDER_SITE_OTHER): Payer: Medicare HMO | Admitting: *Deleted

## 2017-09-08 DIAGNOSIS — I255 Ischemic cardiomyopathy: Secondary | ICD-10-CM

## 2017-09-08 NOTE — Telephone Encounter (Signed)
Confirmed remote transmission w/ pt wife.   

## 2017-09-09 NOTE — Progress Notes (Signed)
Remote ICD transmission.   

## 2017-09-16 ENCOUNTER — Encounter: Payer: Self-pay | Admitting: Cardiology

## 2017-09-16 LAB — CUP PACEART REMOTE DEVICE CHECK
Battery Remaining Percentage: 100 %
Brady Statistic RV Percent Paced: 99 %
Date Time Interrogation Session: 20181206210400
HIGH POWER IMPEDANCE MEASURED VALUE: 82 Ohm
Implantable Lead Implant Date: 20140509
Implantable Lead Implant Date: 20140509
Implantable Lead Model: 4555
Implantable Lead Serial Number: 118610
Implantable Lead Serial Number: 207706
Lead Channel Impedance Value: 697 Ohm
Lead Channel Pacing Threshold Amplitude: 0.7 V
Lead Channel Pacing Threshold Amplitude: 0.8 V
Lead Channel Pacing Threshold Pulse Width: 0.4 ms
Lead Channel Pacing Threshold Pulse Width: 0.4 ms
Lead Channel Pacing Threshold Pulse Width: 1 ms
Lead Channel Setting Pacing Amplitude: 2 V
Lead Channel Setting Pacing Amplitude: 2.5 V
Lead Channel Setting Pacing Pulse Width: 0.4 ms
Lead Channel Setting Pacing Pulse Width: 1 ms
MDC IDC LEAD IMPLANT DT: 20140509
MDC IDC LEAD LOCATION: 753858
MDC IDC LEAD LOCATION: 753859
MDC IDC LEAD LOCATION: 753860
MDC IDC LEAD SERIAL: 29354050
MDC IDC MSMT BATTERY REMAINING LONGEVITY: 72 mo
MDC IDC MSMT LEADCHNL LV IMPEDANCE VALUE: 1033 Ohm
MDC IDC MSMT LEADCHNL LV PACING THRESHOLD AMPLITUDE: 0.8 V
MDC IDC MSMT LEADCHNL RA IMPEDANCE VALUE: 702 Ohm
MDC IDC PG IMPLANT DT: 20140509
MDC IDC PG SERIAL: 950230
MDC IDC SET LEADCHNL LV PACING AMPLITUDE: 2 V
MDC IDC SET LEADCHNL LV SENSING SENSITIVITY: 1 mV
MDC IDC SET LEADCHNL RV SENSING SENSITIVITY: 0.6 mV
MDC IDC STAT BRADY RA PERCENT PACED: 35 %

## 2017-09-30 ENCOUNTER — Ambulatory Visit (INDEPENDENT_AMBULATORY_CARE_PROVIDER_SITE_OTHER): Payer: Medicare HMO | Admitting: *Deleted

## 2017-09-30 DIAGNOSIS — I4891 Unspecified atrial fibrillation: Secondary | ICD-10-CM | POA: Diagnosis not present

## 2017-09-30 DIAGNOSIS — Z5181 Encounter for therapeutic drug level monitoring: Secondary | ICD-10-CM | POA: Diagnosis not present

## 2017-09-30 LAB — POCT INR: INR: 3.5

## 2017-09-30 NOTE — Patient Instructions (Signed)
Take coumadin 1/2 tablet tonight then resume 2 tablets daily except 3 tablets on Wednesdays  Continue greens Recheck in 3 weeks

## 2017-10-24 ENCOUNTER — Ambulatory Visit (INDEPENDENT_AMBULATORY_CARE_PROVIDER_SITE_OTHER): Payer: Medicare HMO | Admitting: *Deleted

## 2017-10-24 DIAGNOSIS — I4891 Unspecified atrial fibrillation: Secondary | ICD-10-CM

## 2017-10-24 DIAGNOSIS — Z5181 Encounter for therapeutic drug level monitoring: Secondary | ICD-10-CM | POA: Diagnosis not present

## 2017-10-24 LAB — POCT INR: INR: 1.8

## 2017-10-27 ENCOUNTER — Ambulatory Visit: Payer: Medicare HMO | Admitting: Gastroenterology

## 2017-11-15 ENCOUNTER — Encounter: Payer: Self-pay | Admitting: *Deleted

## 2017-11-28 ENCOUNTER — Ambulatory Visit (INDEPENDENT_AMBULATORY_CARE_PROVIDER_SITE_OTHER): Payer: Medicare HMO | Admitting: *Deleted

## 2017-11-28 DIAGNOSIS — I4891 Unspecified atrial fibrillation: Secondary | ICD-10-CM

## 2017-11-28 DIAGNOSIS — Z5181 Encounter for therapeutic drug level monitoring: Secondary | ICD-10-CM

## 2017-11-28 DIAGNOSIS — Z7901 Long term (current) use of anticoagulants: Secondary | ICD-10-CM | POA: Diagnosis not present

## 2017-11-28 LAB — POCT INR: INR: 1.3

## 2017-11-28 NOTE — Patient Instructions (Signed)
Take coumadin 3 tablets tonight and tomorrow night then increase dose to 2 tablets daily except 3 tablets on Mondays, Wednesdays and Fridays  Continue greens Recheck in 10 days

## 2017-11-29 ENCOUNTER — Telehealth: Payer: Self-pay | Admitting: *Deleted

## 2017-11-29 MED ORDER — WARFARIN SODIUM 4 MG PO TABS: ORAL_TABLET | ORAL | 4 refills | Status: DC

## 2017-11-29 NOTE — Telephone Encounter (Signed)
Pt is almost out of his Coumadin, he's ordered some through [facility], but he's not sure it will be here in time. Can be sent to [facility]

## 2017-12-07 ENCOUNTER — Ambulatory Visit (INDEPENDENT_AMBULATORY_CARE_PROVIDER_SITE_OTHER): Payer: Medicare HMO | Admitting: *Deleted

## 2017-12-07 DIAGNOSIS — I4891 Unspecified atrial fibrillation: Secondary | ICD-10-CM

## 2017-12-07 DIAGNOSIS — Z5181 Encounter for therapeutic drug level monitoring: Secondary | ICD-10-CM | POA: Diagnosis not present

## 2017-12-07 LAB — POCT INR: INR: 2.4

## 2017-12-07 MED ORDER — WARFARIN SODIUM 4 MG PO TABS: ORAL_TABLET | ORAL | 4 refills | Status: DC

## 2017-12-07 NOTE — Patient Instructions (Signed)
Continue coumadin 2 tablets daily except 3 tablets on Mondays, Wednesdays and Fridays  Continue greens Recheck in 3 weeks

## 2017-12-08 ENCOUNTER — Ambulatory Visit (INDEPENDENT_AMBULATORY_CARE_PROVIDER_SITE_OTHER): Payer: Medicare HMO | Admitting: *Deleted

## 2017-12-08 ENCOUNTER — Ambulatory Visit: Payer: Medicare HMO | Admitting: *Deleted

## 2017-12-08 ENCOUNTER — Telehealth: Payer: Self-pay | Admitting: Cardiology

## 2017-12-08 DIAGNOSIS — I255 Ischemic cardiomyopathy: Secondary | ICD-10-CM

## 2017-12-08 NOTE — Telephone Encounter (Signed)
Confirmed remote transmission w/ pt wife.   

## 2017-12-09 ENCOUNTER — Encounter: Payer: Self-pay | Admitting: Cardiology

## 2017-12-12 DIAGNOSIS — I25119 Atherosclerotic heart disease of native coronary artery with unspecified angina pectoris: Secondary | ICD-10-CM | POA: Diagnosis not present

## 2017-12-12 DIAGNOSIS — E1165 Type 2 diabetes mellitus with hyperglycemia: Secondary | ICD-10-CM | POA: Diagnosis not present

## 2017-12-12 DIAGNOSIS — J449 Chronic obstructive pulmonary disease, unspecified: Secondary | ICD-10-CM | POA: Diagnosis not present

## 2017-12-12 DIAGNOSIS — I5042 Chronic combined systolic (congestive) and diastolic (congestive) heart failure: Secondary | ICD-10-CM | POA: Diagnosis not present

## 2017-12-20 ENCOUNTER — Encounter: Payer: Self-pay | Admitting: Cardiology

## 2017-12-20 NOTE — Progress Notes (Signed)
Remote ICD transmission.   

## 2017-12-21 LAB — CUP PACEART REMOTE DEVICE CHECK
Battery Remaining Longevity: 72 mo
Battery Remaining Percentage: 100 %
Brady Statistic RV Percent Paced: 100 %
Date Time Interrogation Session: 20190307202300
HIGH POWER IMPEDANCE MEASURED VALUE: 84 Ohm
Implantable Lead Implant Date: 20140509
Implantable Lead Implant Date: 20140509
Implantable Lead Location: 753859
Implantable Lead Model: 293
Implantable Lead Model: 4136
Implantable Lead Model: 4555
Implantable Lead Serial Number: 118610
Implantable Lead Serial Number: 207706
Implantable Pulse Generator Implant Date: 20140509
Lead Channel Impedance Value: 675 Ohm
Lead Channel Pacing Threshold Amplitude: 0.7 V
Lead Channel Pacing Threshold Amplitude: 0.8 V
Lead Channel Pacing Threshold Amplitude: 0.8 V
Lead Channel Pacing Threshold Pulse Width: 0.4 ms
Lead Channel Pacing Threshold Pulse Width: 0.4 ms
Lead Channel Setting Pacing Amplitude: 2 V
Lead Channel Setting Pacing Amplitude: 2.5 V
Lead Channel Setting Pacing Pulse Width: 0.4 ms
Lead Channel Setting Sensing Sensitivity: 0.6 mV
MDC IDC LEAD IMPLANT DT: 20140509
MDC IDC LEAD LOCATION: 753858
MDC IDC LEAD LOCATION: 753860
MDC IDC LEAD SERIAL: 29354050
MDC IDC MSMT LEADCHNL LV IMPEDANCE VALUE: 1205 Ohm
MDC IDC MSMT LEADCHNL LV PACING THRESHOLD PULSEWIDTH: 1 ms
MDC IDC MSMT LEADCHNL RA IMPEDANCE VALUE: 777 Ohm
MDC IDC PG SERIAL: 950230
MDC IDC SET LEADCHNL LV PACING AMPLITUDE: 2 V
MDC IDC SET LEADCHNL LV PACING PULSEWIDTH: 1 ms
MDC IDC SET LEADCHNL LV SENSING SENSITIVITY: 1 mV
MDC IDC STAT BRADY RA PERCENT PACED: 41 %

## 2017-12-26 DIAGNOSIS — Z0100 Encounter for examination of eyes and vision without abnormal findings: Secondary | ICD-10-CM | POA: Diagnosis not present

## 2017-12-26 DIAGNOSIS — H25813 Combined forms of age-related cataract, bilateral: Secondary | ICD-10-CM | POA: Diagnosis not present

## 2017-12-26 DIAGNOSIS — E119 Type 2 diabetes mellitus without complications: Secondary | ICD-10-CM | POA: Diagnosis not present

## 2017-12-28 ENCOUNTER — Ambulatory Visit (INDEPENDENT_AMBULATORY_CARE_PROVIDER_SITE_OTHER): Payer: Medicare HMO | Admitting: *Deleted

## 2017-12-28 DIAGNOSIS — I4891 Unspecified atrial fibrillation: Secondary | ICD-10-CM | POA: Diagnosis not present

## 2017-12-28 DIAGNOSIS — Z5181 Encounter for therapeutic drug level monitoring: Secondary | ICD-10-CM | POA: Diagnosis not present

## 2017-12-28 DIAGNOSIS — Z7901 Long term (current) use of anticoagulants: Secondary | ICD-10-CM

## 2017-12-28 LAB — POCT INR: INR: 1.6

## 2017-12-28 NOTE — Patient Instructions (Signed)
Take 3 tablets tonight and tomorrow night then increase dose to 3 tablets daily except 2 tablets on Sundays, Tuesdays and Thursdays  Continue greens Recheck in 2 weeks

## 2018-01-11 ENCOUNTER — Ambulatory Visit (INDEPENDENT_AMBULATORY_CARE_PROVIDER_SITE_OTHER): Payer: Medicare HMO | Admitting: *Deleted

## 2018-01-11 DIAGNOSIS — I4891 Unspecified atrial fibrillation: Secondary | ICD-10-CM | POA: Diagnosis not present

## 2018-01-11 DIAGNOSIS — Z5181 Encounter for therapeutic drug level monitoring: Secondary | ICD-10-CM

## 2018-01-11 LAB — POCT INR: INR: 2

## 2018-01-11 NOTE — Patient Instructions (Signed)
Increase coumadin to 3 tablets daily except 2 tablets on Sundays and Thursdays  Continue greens Recheck in 3 weeks

## 2018-01-18 ENCOUNTER — Ambulatory Visit: Payer: Medicare HMO | Admitting: Gastroenterology

## 2018-02-01 ENCOUNTER — Ambulatory Visit (INDEPENDENT_AMBULATORY_CARE_PROVIDER_SITE_OTHER): Payer: Medicare HMO | Admitting: *Deleted

## 2018-02-01 DIAGNOSIS — Z5181 Encounter for therapeutic drug level monitoring: Secondary | ICD-10-CM

## 2018-02-01 DIAGNOSIS — I4891 Unspecified atrial fibrillation: Secondary | ICD-10-CM | POA: Diagnosis not present

## 2018-02-01 LAB — POCT INR: INR: 2.7

## 2018-02-01 NOTE — Patient Instructions (Signed)
Continue coumadin 3 tablets daily except 2 tablets on Sundays and Thursdays  Continue greens Recheck in 4 weeks 

## 2018-03-01 ENCOUNTER — Ambulatory Visit (INDEPENDENT_AMBULATORY_CARE_PROVIDER_SITE_OTHER): Payer: Medicare HMO | Admitting: *Deleted

## 2018-03-01 DIAGNOSIS — Z5181 Encounter for therapeutic drug level monitoring: Secondary | ICD-10-CM | POA: Diagnosis not present

## 2018-03-01 DIAGNOSIS — I4891 Unspecified atrial fibrillation: Secondary | ICD-10-CM | POA: Diagnosis not present

## 2018-03-01 LAB — POCT INR: INR: 2.2 (ref 2.0–3.0)

## 2018-03-01 MED ORDER — WARFARIN SODIUM 4 MG PO TABS: ORAL_TABLET | ORAL | 4 refills | Status: DC

## 2018-03-01 NOTE — Patient Instructions (Signed)
Continue coumadin 3 tablets daily except 2 tablets on Sundays and Thursdays  Continue greens Recheck in 4 weeks 

## 2018-03-08 ENCOUNTER — Ambulatory Visit (INDEPENDENT_AMBULATORY_CARE_PROVIDER_SITE_OTHER): Payer: Medicare HMO | Admitting: Internal Medicine

## 2018-03-08 ENCOUNTER — Encounter: Payer: Self-pay | Admitting: Internal Medicine

## 2018-03-08 VITALS — BP 134/68 | HR 60 | Ht 72.0 in | Wt 174.4 lb

## 2018-03-08 DIAGNOSIS — I10 Essential (primary) hypertension: Secondary | ICD-10-CM | POA: Diagnosis not present

## 2018-03-08 DIAGNOSIS — I5042 Chronic combined systolic (congestive) and diastolic (congestive) heart failure: Secondary | ICD-10-CM

## 2018-03-08 MED ORDER — NITROGLYCERIN 0.4 MG SL SUBL: 0.4000 mg | SUBLINGUAL_TABLET | SUBLINGUAL | 3 refills | Status: AC | PRN

## 2018-03-08 NOTE — Patient Instructions (Signed)
Medication Instructions:  Your physician recommends that you continue on your current medications as directed. Please refer to the Current Medication list given to you today.   Labwork: NONE   Testing/Procedures: NONE   Follow-Up: Your physician wants you to follow-up in: 1 Year. You will receive a reminder letter in the mail two months in advance. If you don't receive a letter, please call our office to schedule the follow-up appointment.   Any Other Special Instructions Will Be Listed Below (If Applicable).     If you need a refill on your cardiac medications before your next appointment, please call your pharmacy.  Thank you for choosing [facility]!   

## 2018-03-08 NOTE — Progress Notes (Signed)
HPI [name] returns today for followup. He is a pleasant 62 yo man with an ICM, CHB, s/p BiV ICD, who has developed atrial fib/flutter in the interim. He is asymptomatic. He denies chest pain or sob. No edema or palpitations. He c/o numbness in his legs from neuropathy. On remote monitoring it has been noted that his LV pacing impedence was increasing but has since plateaued. He has not been in the hospital since his last visit. He is still smoking.  Allergies  Allergen Reactions  . Vancomycin Itching and Rash     Current Outpatient Medications  Medication Sig Dispense Refill  . acetaminophen (TYLENOL) 500 MG tablet Take 1 tablet by mouth as needed.    [name] albuterol (PROVENTIL HFA;VENTOLIN HFA) 108 (90 BASE) MCG/ACT inhaler Inhale 2 puffs into the lungs every 6 (six) hours as needed. For shortness of breath    . albuterol (PROVENTIL) (2.5 MG/3ML) 0.083% nebulizer solution Take 2.5 mg by nebulization every 6 (six) hours as needed. For shortness of breath    . carvedilol (COREG) 6.25 MG tablet Take 6.25 mg by mouth 2 (two) times daily with a meal.     . ferrous sulfate 325 (65 FE) MG EC tablet Take 325 mg by mouth 2 (two) times daily.    . furosemide (LASIX) 40 MG tablet TAKE 1 TABLET IN THE MORNING  AND TAKE 1/2 TABLET IN THE EVENING 135 tablet 3  . gabapentin (NEURONTIN) 300 MG capsule Take 300 mg by mouth at bedtime.    . insulin glargine (LANTUS) 100 UNIT/ML injection Inject 20 Units into the skin 2 (two) times daily.     [name] lisinopril (PRINIVIL,ZESTRIL) 10 MG tablet Take 1 tablet by mouth daily.    [name] loratadine (CLARITIN) 10 MG tablet Take 10 mg by mouth daily as needed for allergies. As needed    . magnesium oxide (MAG-OX) 400 MG tablet Take 400 mg by mouth 2 (two) times daily.    . metFORMIN (GLUCOPHAGE) 500 MG tablet Take 1,000 mg by mouth 2 (two) times daily with a meal.    . metolazone (ZAROXOLYN) 2.5 MG tablet TAKE 1 TABLET (2.5 MG) ON SUNDAY AND WEDNESDAY ONLY 24 tablet 3    . Multiple Vitamin (MULTIVITAMIN) tablet Take 1 tablet by mouth daily.    . nitroGLYCERIN (NITROSTAT) 0.4 MG SL tablet Place 0.4 mg under the tongue every 5 (five) minutes x 3 doses as needed. For chest pain    . ondansetron (ZOFRAN) 4 MG tablet Take 4 mg by mouth 4 (four) times daily as needed for nausea or vomiting.    . pantoprazole (PROTONIX) 40 MG tablet Take 1 tablet (40 mg total) by mouth daily before breakfast. 30 tablet 12  . primidone (MYSOLINE) 50 MG tablet Take by mouth daily.    . sertraline (ZOLOFT) 50 MG tablet Take 50 mg by mouth daily.    . simvastatin (ZOCOR) 80 MG tablet Take 40 mg by mouth at bedtime.    [name] tiZANidine (ZANAFLEX) 4 MG tablet Take 4 mg by mouth 3 (three) times daily.    . traMADol (ULTRAM) 50 MG tablet Take 50-100 mg by mouth every 4 (four) hours as needed.     . warfarin (COUMADIN) 4 MG tablet Take 3 tablets daily except 2 tablets on Sundays and Thursdays 270 tablet 4   No current facility-administered medications for this visit.      Past Medical History:  Diagnosis Date  . Anemia, normocytic normochromic 2008  2008  . Arteriosclerotic cardiovascular disease (ASCVD)    Acute MI in 2006->  LAD stent, EF-37%  . Arthritis    Hx: of in hands  . Automatic implantable cardioverter-defibrillator in situ   . Carpal tunnel syndrome   . Chronic systolic (congestive) heart failure (HCC)    a. EF 20-25% by echo in 03/2017  . Colon cancer ([place]) 2006   Partial colectomy in 2006  . Complete heart block ([place])    S/P ICD 02/09/13  . COPD (chronic obstructive pulmonary disease) ([place])   . Coronary artery disease   . Diabetes mellitus, type II ([place])    With peripheral neuropathy  . Edema    venous insufficiency  . H/O alcohol dependence ([place])   . H/O hiatal hernia   . Hyperlipidemia   . Hypertension   . Myocardial infarction [facility])    Hx: of 2006  . Neuropathy in diabetes ([place])    Hx: of  . Pacemaker   . Pneumonia 01/2012   01/2012; subsequent admission  for chest pain  . Shortness of breath    Hx: of   . Tobacco abuse     ROS:   All systems reviewed and negative except as noted in the HPI.   Past Surgical History:  Procedure Laterality Date  . ANKLE SURGERY     Right  . Biventricular ICD Placement  02/09/2013   Dr. Lovena Le  . COLONOSCOPY  08/17/2005   Pancolonic diverticula/Multiple rectal polyps removed as described above. The larger of the two likely responsible for intermittent hematochezia/ Multiple polyps at the hepatic flexure resected with a snare./ Large polypoid lesion growing out of the base of the cecum, just behind the  ileocecal valve. This was not felt to be amendable to endoscopic resection. Biopsied multiple times  . CORONARY ANGIOPLASTY WITH STENT PLACEMENT    . ESOPHAGOGASTRODUODENOSCOPY N/A 03/29/2017   Barrett's esophagus, gastritis, duodenitis, focal glandular atypia on biopsy. Needs surveillance June 2019   . FLEXIBLE SIGMOIDOSCOPY    06/13/2006   Essentially normal residual rectum status post total colectomy. anastomosis at 25cm.  Focal area of abnormality adjacent to suture, as described above, likely not significant, suspect granulation tissue, biopsied.  Anastomosis otherwise appeared normal.  Distal terminal ileum appeared normal  . FLEXIBLE SIGMOIDOSCOPY N/A 01/13/2015   Dr. Gala Romney: normal-appearing residual rectum, surgical anastomosis, s/p subtotal colectomy. Surveillance 5 years   . FLEXIBLE SIGMOIDOSCOPY N/A 03/29/2017   Dr. Oneida Alar while inpatient: intermittent rectal bleeding due to ischemic erosion at anastomosis,s/p APC therapy. one 3 mm polyp in recum (hyperplastic). surveillance in 3 years  . INGUINAL HERNIA REPAIR    . MULTIPLE EXTRACTIONS WITH ALVEOLOPLASTY N/A 04/09/2013   Procedure: Extraction of tooth #'s 1,2,4,5,6,7,8,9,10,11,12,13,17,18,20,21,22,23,27,28, 29,30,31, and 32 with alveoloplasty.;  Surgeon: Lenn Cal, DDS;  Location: [facility];  Service: Oral Surgery;  Laterality: N/A;  . PERMANENT  PACEMAKER INSERTION N/A 02/09/2013   Procedure: PERMANENT PACEMAKER INSERTION;  Surgeon: Evans Lance, MD;  Location: [facility] CATH LAB;  Service: Cardiovascular;  Laterality: N/A;  . SUBTOTAL COLECTOMY  09/01/2005   subtotal colectomy  . TEMPORARY PACEMAKER INSERTION Right 02/09/2013   Procedure: TEMPORARY PACEMAKER INSERTION;  Surgeon: Sinclair Grooms, MD;  Location: [facility] CATH LAB;  Service: Cardiovascular;  Laterality: Right;     Family History  Problem Relation Age of Onset  . Colon cancer Mother        Carlen Fils, ?>age 35.  [name] Heart disease Mother   . Heart disease Father   .  Hypertension Brother   . Heart disease Brother   . Diabetes Brother   . Liver disease Neg Hx      Social History   Socioeconomic History  . Marital status: Married    Spouse name: Not on file  . Number of children: 1  . Years of education: Not on file  . Highest education level: Not on file  Occupational History  . Occupation: Writer    Comment: Disabled  Social Needs  . Financial resource strain: Not on file  . Food insecurity:    Worry: Not on file    Inability: Not on file  . Transportation needs:    Medical: Not on file    Non-medical: Not on file  Tobacco Use  . Smoking status: Current Every Day Smoker    Packs/day: 1.00    Years: 40.00    Pack years: 40.00    Types: Cigarettes    Start date: 07/13/1971  . Smokeless tobacco: Never Used  Substance and Sexual Activity  . Alcohol use: No    Alcohol/week: 0.0 oz    Comment: Quit 2005 from a 6 pack a day use  . Drug use: No  . Sexual activity: Never  Lifestyle  . Physical activity:    Days per week: Not on file    Minutes per session: Not on file  . Stress: Not on file  Relationships  . Social connections:    Talks on phone: Not on file    Gets together: Not on file    Attends religious service: Not on file    Active member of club or organization: Not on file    Attends meetings of clubs or organizations: Not on file     Relationship status: Not on file  . Intimate partner violence:    Fear of current or ex partner: Not on file    Emotionally abused: Not on file    Physically abused: Not on file    Forced sexual activity: Not on file  Other Topics Concern  . Not on file  Social History Narrative  . Not on file     BP 134/68   Pulse 60   Ht 6' (1.829 m)   Wt 174 lb 6.4 oz (79.1 kg)   SpO2 93%   BMI 23.65 kg/m   Physical Exam:  Well appearing but diskempt 62 yo man, NAD HEENT: Unremarkable Neck:  6 cm JVD, no thyromegally Lymphatics:  No adenopathy Back:  No CVA tenderness Lungs:  Clear with no wheezes HEART:  Regular rate rhythm, no murmurs, no rubs, no clicks Abd:  soft, positive bowel sounds, no organomegally, no rebound, no guarding Ext:  2 plus pulses, no edema, no cyanosis, no clubbing Skin:  No rashes no nodules Neuro:  CN II through XII intact, motor grossly intact   DEVICE  Normal device function.  See PaceArt for details.   Assess/Plan: 1. ICD - his [place] ICD is working normally. Thresholds are all good and his LV impedence has plateaued.  2. Chronic systolic heart failure - his symptoms remain class 2. He will continue his current meds. 3. Tobacco abuse - I have encouraged him to start trying to reduce his tobacco use.  Mikle Bosworth.D.

## 2018-03-14 DIAGNOSIS — H401131 Primary open-angle glaucoma, bilateral, mild stage: Secondary | ICD-10-CM | POA: Diagnosis not present

## 2018-03-14 DIAGNOSIS — H2512 Age-related nuclear cataract, left eye: Secondary | ICD-10-CM | POA: Diagnosis not present

## 2018-03-14 DIAGNOSIS — I10 Essential (primary) hypertension: Secondary | ICD-10-CM | POA: Diagnosis not present

## 2018-03-14 DIAGNOSIS — H401130 Primary open-angle glaucoma, bilateral, stage unspecified: Secondary | ICD-10-CM | POA: Diagnosis not present

## 2018-03-14 DIAGNOSIS — H25013 Cortical age-related cataract, bilateral: Secondary | ICD-10-CM | POA: Diagnosis not present

## 2018-03-14 DIAGNOSIS — H2513 Age-related nuclear cataract, bilateral: Secondary | ICD-10-CM | POA: Diagnosis not present

## 2018-03-20 ENCOUNTER — Other Ambulatory Visit: Payer: Self-pay | Admitting: Adult Health

## 2018-03-20 ENCOUNTER — Ambulatory Visit (INDEPENDENT_AMBULATORY_CARE_PROVIDER_SITE_OTHER): Payer: Medicare HMO | Admitting: *Deleted

## 2018-03-20 DIAGNOSIS — I255 Ischemic cardiomyopathy: Secondary | ICD-10-CM

## 2018-03-20 DIAGNOSIS — I5022 Chronic systolic (congestive) heart failure: Secondary | ICD-10-CM

## 2018-03-20 NOTE — Progress Notes (Signed)
Remote ICD transmission.   

## 2018-03-22 LAB — CUP PACEART REMOTE DEVICE CHECK
Battery Remaining Percentage: 100 %
Brady Statistic RA Percent Paced: 83 %
Brady Statistic RV Percent Paced: 100 %
Date Time Interrogation Session: 20190617042800
HighPow Impedance: 83 Ohm
Implantable Lead Implant Date: 20140509
Implantable Lead Implant Date: 20140509
Implantable Lead Implant Date: 20140509
Implantable Lead Location: 753860
Implantable Lead Model: 4555
Implantable Lead Serial Number: 29354050
Lead Channel Impedance Value: 688 Ohm
Lead Channel Impedance Value: 751 Ohm
Lead Channel Pacing Threshold Pulse Width: 0.4 ms
Lead Channel Pacing Threshold Pulse Width: 1 ms
Lead Channel Setting Pacing Amplitude: 2 V
Lead Channel Setting Pacing Amplitude: 2 V
Lead Channel Setting Pacing Pulse Width: 0.4 ms
Lead Channel Setting Sensing Sensitivity: 1 mV
MDC IDC LEAD LOCATION: 753858
MDC IDC LEAD LOCATION: 753859
MDC IDC LEAD SERIAL: 118610
MDC IDC LEAD SERIAL: 207706
MDC IDC MSMT BATTERY REMAINING LONGEVITY: 66 mo
MDC IDC MSMT LEADCHNL LV IMPEDANCE VALUE: 1315 Ohm
MDC IDC MSMT LEADCHNL LV PACING THRESHOLD AMPLITUDE: 0.7 V
MDC IDC MSMT LEADCHNL RA PACING THRESHOLD AMPLITUDE: 0.7 V
MDC IDC MSMT LEADCHNL RV PACING THRESHOLD AMPLITUDE: 0.7 V
MDC IDC MSMT LEADCHNL RV PACING THRESHOLD PULSEWIDTH: 0.4 ms
MDC IDC PG IMPLANT DT: 20140509
MDC IDC SET LEADCHNL LV PACING PULSEWIDTH: 1 ms
MDC IDC SET LEADCHNL RV PACING AMPLITUDE: 2.5 V
MDC IDC SET LEADCHNL RV SENSING SENSITIVITY: 0.6 mV
Pulse Gen Serial Number: 950230

## 2018-03-29 ENCOUNTER — Ambulatory Visit (INDEPENDENT_AMBULATORY_CARE_PROVIDER_SITE_OTHER): Payer: Medicare HMO | Admitting: *Deleted

## 2018-03-29 DIAGNOSIS — I4891 Unspecified atrial fibrillation: Secondary | ICD-10-CM | POA: Diagnosis not present

## 2018-03-29 DIAGNOSIS — Z5181 Encounter for therapeutic drug level monitoring: Secondary | ICD-10-CM

## 2018-03-29 LAB — POCT INR: INR: 2.2 (ref 2.0–3.0)

## 2018-03-29 NOTE — Patient Instructions (Signed)
Continue coumadin 3 tablets daily except 2 tablets on Sundays and Thursdays  Continue greens Recheck in 4 weeks

## 2018-04-11 ENCOUNTER — Telehealth: Payer: Self-pay | Admitting: *Deleted

## 2018-04-11 NOTE — Telephone Encounter (Signed)
Pt is on coumadin

## 2018-04-11 NOTE — Telephone Encounter (Signed)
   [facility] Pre-operative Risk Assessment    Request for surgical clearance:  1. What type of surgery is being performed? Cataract extraction w/ Intraocular Lens implantation of left eye followed by right eye   2. When is this surgery scheduled? 04/17/18       3.   What type of clearance is required (medical clearance vs. Pharmacy clearance to hold med vs. Both)? Medical Clearance  4.   Are there any medications that need to be held prior to surgery and how long? clearance states "the patient does NOT need to hold any meds including Plavix, Xarelto, etc"    5.   Practice name and name of physician performing surgery? [facility], [facility]; Darleen Crocker, M.D.  6.   What is your office phone number: [phone]    7.   What is your office fax number [phone]  8.   Anesthesia type (None, local, MAC, general) ?  Topical anesthesia and IV medication (conscious sedation)   Rodman Key 04/11/2018, 3:02 PM  _________________________________________________________________   (provider comments below)

## 2018-04-11 NOTE — Telephone Encounter (Signed)
   Primary Cardiologist: Carlyle Dolly, MD  Chart reviewed as part of pre-operative protocol coverage. Given past medical history of MI, cardiomyopathy and pacemaker and defibrillator and time since last visit 03/08/18, based on ACC/AHA guidelines, [name] would be at acceptable risk for the planned procedure without further cardiovascular testing.  He has chronic shortness of breath but no pain and meets 4 METS.  I will route this recommendation to the requesting party via Epic fax function and remove from pre-op pool.  Please call with questions.  Cecilie Kicks, NP 04/11/2018, 5:25 PM

## 2018-04-13 DIAGNOSIS — J449 Chronic obstructive pulmonary disease, unspecified: Secondary | ICD-10-CM | POA: Diagnosis not present

## 2018-04-13 DIAGNOSIS — I25119 Atherosclerotic heart disease of native coronary artery with unspecified angina pectoris: Secondary | ICD-10-CM | POA: Diagnosis not present

## 2018-04-13 DIAGNOSIS — I5042 Chronic combined systolic (congestive) and diastolic (congestive) heart failure: Secondary | ICD-10-CM | POA: Diagnosis not present

## 2018-04-13 DIAGNOSIS — E1165 Type 2 diabetes mellitus with hyperglycemia: Secondary | ICD-10-CM | POA: Diagnosis not present

## 2018-04-13 DIAGNOSIS — Z79891 Long term (current) use of opiate analgesic: Secondary | ICD-10-CM | POA: Diagnosis not present

## 2018-04-18 ENCOUNTER — Other Ambulatory Visit: Payer: Self-pay | Admitting: Internal Medicine

## 2018-04-26 ENCOUNTER — Ambulatory Visit (INDEPENDENT_AMBULATORY_CARE_PROVIDER_SITE_OTHER): Payer: Medicare HMO | Admitting: *Deleted

## 2018-04-26 DIAGNOSIS — I5042 Chronic combined systolic (congestive) and diastolic (congestive) heart failure: Secondary | ICD-10-CM | POA: Diagnosis not present

## 2018-04-26 DIAGNOSIS — J449 Chronic obstructive pulmonary disease, unspecified: Secondary | ICD-10-CM | POA: Diagnosis not present

## 2018-04-26 DIAGNOSIS — Z5181 Encounter for therapeutic drug level monitoring: Secondary | ICD-10-CM

## 2018-04-26 DIAGNOSIS — E1165 Type 2 diabetes mellitus with hyperglycemia: Secondary | ICD-10-CM | POA: Diagnosis not present

## 2018-04-26 DIAGNOSIS — I4891 Unspecified atrial fibrillation: Secondary | ICD-10-CM | POA: Diagnosis not present

## 2018-04-26 DIAGNOSIS — I25119 Atherosclerotic heart disease of native coronary artery with unspecified angina pectoris: Secondary | ICD-10-CM | POA: Diagnosis not present

## 2018-04-26 LAB — POCT INR: INR: 1.9 — AB (ref 2.0–3.0)

## 2018-04-26 NOTE — Patient Instructions (Signed)
Take coumadin 3 tablets today and tomorrow then resume 3 tablets daily except 2 tablets on Sundays and Thursdays  Continue greens Recheck in 4 weeks

## 2018-05-24 ENCOUNTER — Ambulatory Visit (INDEPENDENT_AMBULATORY_CARE_PROVIDER_SITE_OTHER): Payer: Medicare HMO | Admitting: *Deleted

## 2018-05-24 DIAGNOSIS — I4891 Unspecified atrial fibrillation: Secondary | ICD-10-CM | POA: Diagnosis not present

## 2018-05-24 DIAGNOSIS — Z5181 Encounter for therapeutic drug level monitoring: Secondary | ICD-10-CM

## 2018-05-24 LAB — POCT INR: INR: 2.6 (ref 2.0–3.0)

## 2018-05-24 NOTE — Patient Instructions (Signed)
Description   Continue taking  3 tablets daily except 2 tablets on Sundays and Thursdays  Continue greens Recheck in 4 weeks

## 2018-06-13 DIAGNOSIS — Z79891 Long term (current) use of opiate analgesic: Secondary | ICD-10-CM | POA: Diagnosis not present

## 2018-06-19 ENCOUNTER — Ambulatory Visit (INDEPENDENT_AMBULATORY_CARE_PROVIDER_SITE_OTHER): Payer: Medicare HMO | Admitting: *Deleted

## 2018-06-19 DIAGNOSIS — I255 Ischemic cardiomyopathy: Secondary | ICD-10-CM

## 2018-06-19 DIAGNOSIS — I5022 Chronic systolic (congestive) heart failure: Secondary | ICD-10-CM

## 2018-06-19 NOTE — Progress Notes (Signed)
Remote ICD transmission.   

## 2018-06-21 ENCOUNTER — Ambulatory Visit (INDEPENDENT_AMBULATORY_CARE_PROVIDER_SITE_OTHER): Payer: Medicare HMO | Admitting: *Deleted

## 2018-06-21 DIAGNOSIS — Z5181 Encounter for therapeutic drug level monitoring: Secondary | ICD-10-CM | POA: Diagnosis not present

## 2018-06-21 DIAGNOSIS — I4891 Unspecified atrial fibrillation: Secondary | ICD-10-CM

## 2018-06-21 DIAGNOSIS — Z23 Encounter for immunization: Secondary | ICD-10-CM | POA: Diagnosis not present

## 2018-06-21 LAB — POCT INR: INR: 2.2 (ref 2.0–3.0)

## 2018-06-21 NOTE — Patient Instructions (Signed)
Continue taking  3 tablets daily except 2 tablets on Sundays and Thursdays  Continue greens Recheck in 6 weeks

## 2018-07-14 LAB — CUP PACEART REMOTE DEVICE CHECK
Battery Remaining Longevity: 66 mo
Battery Remaining Percentage: 100 %
Brady Statistic RA Percent Paced: 71 %
Brady Statistic RV Percent Paced: 100 %
Date Time Interrogation Session: 20190916080900
HighPow Impedance: 73 Ohm
Implantable Lead Implant Date: 20140509
Implantable Lead Implant Date: 20140509
Implantable Lead Implant Date: 20140509
Implantable Lead Location: 753858
Implantable Lead Location: 753859
Implantable Lead Location: 753860
Implantable Lead Model: 293
Implantable Lead Model: 4136
Implantable Lead Model: 4555
Implantable Lead Serial Number: 118610
Implantable Lead Serial Number: 207706
Implantable Lead Serial Number: 29354050
Implantable Pulse Generator Implant Date: 20140509
Lead Channel Impedance Value: 1317 Ohm
Lead Channel Impedance Value: 638 Ohm
Lead Channel Impedance Value: 690 Ohm
Lead Channel Pacing Threshold Amplitude: 0.7 V
Lead Channel Pacing Threshold Amplitude: 0.7 V
Lead Channel Pacing Threshold Amplitude: 0.7 V
Lead Channel Pacing Threshold Pulse Width: 0.4 ms
Lead Channel Pacing Threshold Pulse Width: 0.4 ms
Lead Channel Pacing Threshold Pulse Width: 1 ms
Lead Channel Setting Pacing Amplitude: 2 V
Lead Channel Setting Pacing Amplitude: 2 V
Lead Channel Setting Pacing Amplitude: 2.5 V
Lead Channel Setting Pacing Pulse Width: 0.4 ms
Lead Channel Setting Pacing Pulse Width: 1 ms
Lead Channel Setting Sensing Sensitivity: 0.6 mV
Lead Channel Setting Sensing Sensitivity: 1 mV
Pulse Gen Serial Number: 950230

## 2018-08-07 ENCOUNTER — Ambulatory Visit (INDEPENDENT_AMBULATORY_CARE_PROVIDER_SITE_OTHER): Payer: Medicare HMO | Admitting: *Deleted

## 2018-08-07 DIAGNOSIS — I4891 Unspecified atrial fibrillation: Secondary | ICD-10-CM

## 2018-08-07 DIAGNOSIS — Z5181 Encounter for therapeutic drug level monitoring: Secondary | ICD-10-CM | POA: Diagnosis not present

## 2018-08-07 LAB — POCT INR: INR: 2.4 (ref 2.0–3.0)

## 2018-08-07 NOTE — Patient Instructions (Signed)
Continue taking  3 tablets daily except 2 tablets on Sundays and Thursdays  Continue greens Recheck in 6 weeks

## 2018-08-09 ENCOUNTER — Telehealth: Payer: Self-pay | Admitting: Internal Medicine

## 2018-08-09 NOTE — Telephone Encounter (Signed)
Patient wanted to speak with RMR nurse. Please call him at [phone]

## 2018-08-09 NOTE — Telephone Encounter (Signed)
Spoke with pt. He has had water diarrhea x 2 weeks/ pt states that he hasn't made it to the bathroom due to diarrhea coming with no warning. Last ov was 1 year ago. Pt reports no nausea, no vomiting; no stomach pain. Please advise/

## 2018-08-09 NOTE — Telephone Encounter (Signed)
Pt is aware of OV tomorrow at 1030

## 2018-08-09 NOTE — Telephone Encounter (Signed)
Would check stool studies and get in for office visit. He is overdue for visit.

## 2018-08-09 NOTE — Telephone Encounter (Signed)
Spoke with pt. He wants to wait to collect stool samples after his appointment tomorrow 08/10/18.

## 2018-08-10 ENCOUNTER — Telehealth: Payer: Self-pay | Admitting: *Deleted

## 2018-08-10 ENCOUNTER — Other Ambulatory Visit: Payer: Self-pay | Admitting: *Deleted

## 2018-08-10 ENCOUNTER — Encounter: Payer: Self-pay | Admitting: *Deleted

## 2018-08-10 ENCOUNTER — Telehealth: Payer: Self-pay

## 2018-08-10 ENCOUNTER — Encounter: Payer: Self-pay | Admitting: Gastroenterology

## 2018-08-10 ENCOUNTER — Ambulatory Visit (INDEPENDENT_AMBULATORY_CARE_PROVIDER_SITE_OTHER): Payer: Medicare HMO | Admitting: Gastroenterology

## 2018-08-10 VITALS — BP 122/74 | HR 66 | Temp 97.3°F | Ht 72.0 in | Wt 174.4 lb

## 2018-08-10 DIAGNOSIS — K2270 Barrett's esophagus without dysplasia: Secondary | ICD-10-CM

## 2018-08-10 DIAGNOSIS — R197 Diarrhea, unspecified: Secondary | ICD-10-CM

## 2018-08-10 DIAGNOSIS — R1030 Lower abdominal pain, unspecified: Secondary | ICD-10-CM | POA: Diagnosis not present

## 2018-08-10 MED ORDER — HYDROCORTISONE 2.5 % RE CREA: 1.0000 "application " | TOPICAL_CREAM | Freq: Two times a day (BID) | RECTAL | 1 refills | Status: DC

## 2018-08-10 NOTE — Assessment & Plan Note (Signed)
Acute on chronic diarrhea in setting of recent antibiotic exposure, concerning for infectious process. Will check Cdiff, culture, Giardia. Further recommendations to follow. As of note, mild low-volume hematochezia likely due to irritation in setting of increased stools. Sending Anusol cream to pharmacy. Recent flex sig on file but may need further evaluation if continues.

## 2018-08-10 NOTE — Patient Instructions (Signed)
Please complete the stool studies as soon as you can.  I have also ordered a CT scan. Do not take metformin the day of the scan or for 48 hours afterwards.   You will need an upper endoscopy at some point in the near future, but we will address this acute issue first.  I have sent in a rectal cream to your pharmacy. Let me know if any issues obtaining this.  It was a pleasure to see you today. I strive to create trusting relationships with patients to provide genuine, compassionate, and quality care. I value your feedback. If you receive a survey regarding your visit,  I greatly appreciate you taking time to fill this out.   Annitta Needs, PhD, ANP-BC [facility]

## 2018-08-10 NOTE — Assessment & Plan Note (Signed)
EGD 2018 with focal glandular atypia and due for surveillance now. Will deal with acute diarrhea and then proceed with EGD for surveillance once addressed.

## 2018-08-10 NOTE — Assessment & Plan Note (Signed)
Vague lower abdominal pain, poor appetite, sensation of unable to empty urine as productively. Seeing PCP next week. CT abd/pelvis ordered due to history significant for colon cancer s/p subtotal colectomy.

## 2018-08-10 NOTE — Telephone Encounter (Signed)
Pt called and said the rectal cream that Anna sent to the pharmacy is too expensive ( $40.00 plus).  He just cannot afford it. Can we send in something else. He is aware she has left for the day and I will send this message to Walden Field, NP who is doing hospital call today. Pt can be reached at [phone] when we send in something else.

## 2018-08-10 NOTE — Progress Notes (Signed)
Referring Provider: Sinda Du, MD Primary Care Physician:  Sinda Du, MD Primary GI: Dr. Gala Romney   Chief Complaint  Patient presents with  . Diarrhea    took Amox x 10 days (finished last week); was having diarrhea prior to abt  . Abdominal Pain    lower abd    HPI:   [name] is a 62 y.o. male presenting today with a history of colon cancer in 2006 s/p subtotal colectomy. He presented to [facility] June 2018 with Hgb 7.8, iron studies consistent with IDA. Heme positive. On chronic Coumadin. He underwent flex sig and EGD at time of inpatient stay. Flex sig with erosion at anastomosis s/p APC therapy, hyperplastic polyp. Will need surveillance in 2021. EGD with Barrett's esophagus and focal glandular atypia in 2018, due for surveillance now.    Had trouble urinating a few weeks ago, and took amoxicillin that he had left thinking that this would help. Every 20 minutes has to have a BM, with just small output. Incontinence episodes. Watery. Still having difficulty urinating. Feels urgency to urinate but unable to go for a few hours. In past would have warning about diarrhea. Too numerous to count stools. Has seen some bright red blood in stool past few days. Lower abdomen pain, intermittent, "more often than not". Nothing relieves. Nothing sets it off. Right now a 2-3 out of 10. Worsens with trying to go to bathroom. Foul smell. Yellow. Symptoms present prior to taking amoxicillin, worsening. Appetite poor. Rectal burning and itching with multiple stools.   Past Medical History:  Diagnosis Date  . Anemia, normocytic normochromic 2008   2008  . Arteriosclerotic cardiovascular disease (ASCVD)    Acute MI in 2006->  LAD stent, EF-37%  . Arthritis    Hx: of in hands  . Automatic implantable cardioverter-defibrillator in situ   . Carpal tunnel syndrome   . Chronic systolic (congestive) heart failure (HCC)    a. EF 20-25% by echo in 03/2017  . Colon cancer ([place]) 2006   Partial colectomy in 2006  . Complete heart block ([place])    S/P ICD 02/09/13  . COPD (chronic obstructive pulmonary disease) ([place])   . Coronary artery disease   . Diabetes mellitus, type II ([place])    With peripheral neuropathy  . Edema    venous insufficiency  . H/O alcohol dependence ([place])   . H/O hiatal hernia   . Hyperlipidemia   . Hypertension   . Myocardial infarction [facility])    Hx: of 2006  . Neuropathy in diabetes ([place])    Hx: of  . Pacemaker   . Pneumonia 01/2012   01/2012; subsequent admission for chest pain  . Shortness of breath    Hx: of   . Tobacco abuse     Past Surgical History:  Procedure Laterality Date  . ANKLE SURGERY     Right  . Biventricular ICD Placement  02/09/2013   Dr. Lovena Le  . COLONOSCOPY  08/17/2005   Pancolonic diverticula/Multiple rectal polyps removed as described above. The larger of the two likely responsible for intermittent hematochezia/ Multiple polyps at the hepatic flexure resected with a snare./ Large polypoid lesion growing out of the base of the cecum, just behind the  ileocecal valve. This was not felt to be amendable to endoscopic resection. Biopsied multiple times  . CORONARY ANGIOPLASTY WITH STENT PLACEMENT    . ESOPHAGOGASTRODUODENOSCOPY N/A 03/29/2017   Barrett's esophagus, gastritis, duodenitis, focal glandular atypia on biopsy. Needs  surveillance June 2019   . FLEXIBLE SIGMOIDOSCOPY    06/13/2006   Essentially normal residual rectum status post total colectomy. anastomosis at 25cm.  Focal area of abnormality adjacent to suture, as described above, likely not significant, suspect granulation tissue, biopsied.  Anastomosis otherwise appeared normal.  Distal terminal ileum appeared normal  . FLEXIBLE SIGMOIDOSCOPY N/A 01/13/2015   Dr. Gala Romney: normal-appearing residual rectum, surgical anastomosis, s/p subtotal colectomy. Surveillance 5 years   . FLEXIBLE SIGMOIDOSCOPY N/A 03/29/2017   Dr. Oneida Alar while inpatient: intermittent rectal bleeding  due to ischemic erosion at anastomosis,s/p APC therapy. one 3 mm polyp in recum (hyperplastic). surveillance in 3 years  . INGUINAL HERNIA REPAIR    . MULTIPLE EXTRACTIONS WITH ALVEOLOPLASTY N/A 04/09/2013   Procedure: Extraction of tooth #'s 1,2,4,5,6,7,8,9,10,11,12,13,17,18,20,21,22,23,27,28, 29,30,31, and 32 with alveoloplasty.;  Surgeon: Lenn Cal, DDS;  Location: [facility];  Service: Oral Surgery;  Laterality: N/A;  . PERMANENT PACEMAKER INSERTION N/A 02/09/2013   Procedure: PERMANENT PACEMAKER INSERTION;  Surgeon: Evans Lance, MD;  Location: [facility] CATH LAB;  Service: Cardiovascular;  Laterality: N/A;  . SUBTOTAL COLECTOMY  09/01/2005   subtotal colectomy  . TEMPORARY PACEMAKER INSERTION Right 02/09/2013   Procedure: TEMPORARY PACEMAKER INSERTION;  Surgeon: Sinclair Grooms, MD;  Location: [facility] CATH LAB;  Service: Cardiovascular;  Laterality: Right;    Current Outpatient Medications  Medication Sig Dispense Refill  . acetaminophen (TYLENOL) 500 MG tablet Take 1 tablet by mouth as needed.    [name] albuterol (PROVENTIL HFA;VENTOLIN HFA) 108 (90 BASE) MCG/ACT inhaler Inhale 2 puffs into the lungs every 6 (six) hours as needed. For shortness of breath    . albuterol (PROVENTIL) (2.5 MG/3ML) 0.083% nebulizer solution Take 2.5 mg by nebulization every 6 (six) hours as needed. For shortness of breath    . carvedilol (COREG) 6.25 MG tablet Take 6.25 mg by mouth 2 (two) times daily with a meal.     . ferrous sulfate 325 (65 FE) MG EC tablet Take 325 mg by mouth 2 (two) times daily.    . furosemide (LASIX) 40 MG tablet TAKE 1 TABLET IN THE MORNING  AND TAKE 1/2 TABLET IN THE EVENING (Patient taking differently: Take 40 mg by mouth 2 (two) times daily. ) 135 tablet 3  . gabapentin (NEURONTIN) 300 MG capsule Take 300 mg by mouth at bedtime.    . insulin glargine (LANTUS) 100 UNIT/ML injection Inject 20 Units into the skin 2 (two) times daily.     [name] lisinopril (PRINIVIL,ZESTRIL) 10 MG tablet Take 1 tablet by  mouth daily.    [name] loperamide (IMODIUM) 2 MG capsule Take 4 mg by mouth as needed for diarrhea or loose stools.    [name] loratadine (CLARITIN) 10 MG tablet Take 10 mg by mouth daily as needed for allergies. As needed    . magnesium oxide (MAG-OX) 400 MG tablet Take 400 mg by mouth 2 (two) times daily.    . metFORMIN (GLUCOPHAGE) 500 MG tablet Take 1,000 mg by mouth 2 (two) times daily with a meal.    . metolazone (ZAROXOLYN) 2.5 MG tablet TAKE 1 TABLET (2.5 MG) ON SUNDAY AND WEDNESDAY ONLY 24 tablet 3  . Multiple Vitamin (MULTIVITAMIN) tablet Take 1 tablet by mouth daily.    . nitroGLYCERIN (NITROSTAT) 0.4 MG SL tablet Place 1 tablet (0.4 mg total) under the tongue every 5 (five) minutes x 3 doses as needed. For chest pain 25 tablet 3  . ondansetron (ZOFRAN) 4 MG tablet Take 4 mg  by mouth 4 (four) times daily as needed for nausea or vomiting.    . pantoprazole (PROTONIX) 40 MG tablet Take 1 tablet (40 mg total) by mouth daily before breakfast. 30 tablet 12  . primidone (MYSOLINE) 50 MG tablet Take by mouth daily.    . sertraline (ZOLOFT) 50 MG tablet Take 50 mg by mouth daily.    . simvastatin (ZOCOR) 80 MG tablet Take 40 mg by mouth at bedtime.    [name] tiZANidine (ZANAFLEX) 4 MG tablet Take 4 mg by mouth 3 (three) times daily.    . traMADol (ULTRAM) 50 MG tablet Take 50-100 mg by mouth every 4 (four) hours as needed.     . warfarin (COUMADIN) 4 MG tablet Take 3 tablets daily except 2 tablets on Sundays and Thursdays 270 tablet 4   No current facility-administered medications for this visit.     Allergies as of 08/10/2018 - Review Complete 08/10/2018  Allergen Reaction Noted  . Vancomycin Itching and Rash 01/15/2012    Family History  Problem Relation Age of Onset  . Colon cancer Mother        Casy Brunetto, ?>age 14.  [name] Heart disease Mother   . Heart disease Father   . Hypertension Brother   . Heart disease Brother   . Diabetes Brother   . Liver disease Neg Hx     Social History    Socioeconomic History  . Marital status: Married    Spouse name: Not on file  . Number of children: 1  . Years of education: Not on file  . Highest education level: Not on file  Occupational History  . Occupation: Writer    Comment: Disabled  Social Needs  . Financial resource strain: Not on file  . Food insecurity:    Worry: Not on file    Inability: Not on file  . Transportation needs:    Medical: Not on file    Non-medical: Not on file  Tobacco Use  . Smoking status: Current Every Day Smoker    Packs/day: 1.00    Years: 40.00    Pack years: 40.00    Types: Cigarettes    Start date: 07/13/1971  . Smokeless tobacco: Never Used  Substance and Sexual Activity  . Alcohol use: No    Alcohol/week: 0.0 standard drinks    Comment: Quit 2005 from a 6 pack a day use  . Drug use: No  . Sexual activity: Never  Lifestyle  . Physical activity:    Days per week: Not on file    Minutes per session: Not on file  . Stress: Not on file  Relationships  . Social connections:    Talks on phone: Not on file    Gets together: Not on file    Attends religious service: Not on file    Active member of club or organization: Not on file    Attends meetings of clubs or organizations: Not on file    Relationship status: Not on file  Other Topics Concern  . Not on file  Social History Narrative  . Not on file    Review of Systems: Gen: Denies fever, chills, anorexia. Denies fatigue, weakness, weight loss.  CV: Denies chest pain, palpitations, syncope, peripheral edema, and claudication. Resp: Denies dyspnea at rest, cough, wheezing, coughing up blood, and pleurisy. GI: see HPI  Derm: Denies rash, itching, dry skin Psych: Denies depression, anxiety, memory loss, confusion. No homicidal or suicidal ideation.  Heme: see HPI  Physical Exam: BP 122/74   Pulse 66   Temp (!) 97.3 F (36.3 C) (Oral)   Ht 6' (1.829 m)   Wt 174 lb 6.4 oz (79.1 kg)   BMI 23.65 kg/m  General:    Alert and oriented. No distress noted. Pleasant and cooperative.  Head:  Normocephalic and atraumatic. Eyes:  Conjuctiva clear without scleral icterus. Mouth:  Oral mucosa pink and moist. Lungs: mild expiratory wheeze Cardiac: S1 S2 irregularly irregular  Abdomen:  +BS, soft, non-tender and non-distended. No rebound or guarding. No HSM or masses noted. Msk:  Symmetrical without gross deformities. Normal posture. Extremities:  Without edema. Neurologic:  Alert and  oriented x4 Psych:  Alert and cooperative. Normal mood and affect.

## 2018-08-10 NOTE — Telephone Encounter (Signed)
I spoke to [name] and she said the only thing would be PREP H. OTC.and some of the wipes. I called and informed pt and he said he has PREP H.  He will try to come by tomorrow and get some of the wipes. I am leaving them at front desk for pick up.

## 2018-08-10 NOTE — Telephone Encounter (Signed)
CT abd/pelvis PA was approved via [employer]. Auth # [id number] dates 08/29/18-09/28/18.

## 2018-08-10 NOTE — Progress Notes (Signed)
cc'ed to pcp °

## 2018-08-11 DIAGNOSIS — R197 Diarrhea, unspecified: Secondary | ICD-10-CM | POA: Diagnosis not present

## 2018-08-14 ENCOUNTER — Telehealth: Payer: Self-pay

## 2018-08-14 ENCOUNTER — Other Ambulatory Visit: Payer: Self-pay | Admitting: Gastroenterology

## 2018-08-14 MED ORDER — METRONIDAZOLE 500 MG PO TABS: 500.0000 mg | ORAL_TABLET | Freq: Three times a day (TID) | ORAL | 0 refills | Status: AC

## 2018-08-14 NOTE — Telephone Encounter (Signed)
Pt is aware not to use Imodium with Flagyl.

## 2018-08-14 NOTE — Progress Notes (Signed)
I called Dr. Luan Pulling office and asked to speak with Joy. I was on hold for 10 min so I hung up and am faxing this info to [place]. Asking that she call and let me know that she got the info.

## 2018-08-14 NOTE — Progress Notes (Signed)
Please let patient know he has Cdiff. He is allergic to vancomycin per allergy list. This would be the first line treatment for Cdiff; however, as he is allergic, we will use Flagyl. If he does not have a good response, will need to change to Dificid if insurance covers, although it may still be too expensive. Please review sanitization, cleaning guidelines and send info in the mail. I am copying Edrick Oh, RN, on this as an FYI as patient is on Coumadin and INR ?could be affected.   His creatinine is also elevated, but I don't have a baseline for him. Let's hold off on CT unless abdominal discomfort persists despite treatment for CDI. He did not have any concerning signs on physical exam. Increase fluid intake to keep urine yellow. Please send creatinine results to PCP and ensure we speak with staff there to document they received .

## 2018-08-14 NOTE — Telephone Encounter (Signed)
See result note. Pt aware of all except to AVOID Imodium. I called and was given cell phone number of [phone]. I called and Vm not set up.

## 2018-08-14 NOTE — Telephone Encounter (Signed)
Doris: I sent result note. Please see that for further details. He has Cdiff. He is allergic to vanc, so I sent in Flagyl instead. DO NOT TAKE IMODIUM. I would like to cancel CT as well unless his symptoms do not improve (abdominal cramping and diarrhea) in next few days. Needs sanitization information, cleaning, etc. He is on Coumadin, so I have copied Edrick Oh, RN, on the result note as INR can be affected. Please send creatinine results to PCP. I don't have a baseline for this. He needs to increase water intake, urine clear, yellow. Please make sure that staff at PCP's office know of Creatinine results and that he has Cdiff. This may be his baseline. If not, he will likely need to be seen in near future.

## 2018-08-14 NOTE — Telephone Encounter (Signed)
I had a Vm from [facility] to call immediately for results. I spoke to Shay D and pt's creatinine was 2.39 on 08/11/2018. They tried to call office and it was closed. Forwarding to Roseanne Kaufman, NP who ordered the labs.

## 2018-08-14 NOTE — Progress Notes (Signed)
Pt is aware of results and plan. I am mailing info about hand-washing and sanitation with C-Diff. He is aware that Edrick Oh is being made aware of the results. I will call PCP ( Dr. Luan Pulling) about the creatinine.

## 2018-08-15 ENCOUNTER — Encounter: Payer: Self-pay | Admitting: Internal Medicine

## 2018-08-15 DIAGNOSIS — Z0000 Encounter for general adult medical examination without abnormal findings: Secondary | ICD-10-CM | POA: Diagnosis not present

## 2018-08-15 DIAGNOSIS — Z23 Encounter for immunization: Secondary | ICD-10-CM | POA: Diagnosis not present

## 2018-08-15 LAB — C. DIFFICILE GDH AND TOXIN A/B
GDH ANTIGEN: DETECTED
MICRO NUMBER: 91349842
SPECIMEN QUALITY:: ADEQUATE
TOXIN A AND B: DETECTED

## 2018-08-15 LAB — CREATININE, SERUM: CREATININE: 2.39 mg/dL — AB (ref 0.70–1.25)

## 2018-08-15 LAB — STOOL CULTURE
MICRO NUMBER: 91350828
MICRO NUMBER: 91350830
MICRO NUMBER:: 91350829
SHIGA RESULT: NOT DETECTED
SPECIMEN QUALITY: ADEQUATE
SPECIMEN QUALITY: ADEQUATE
SPECIMEN QUALITY:: ADEQUATE

## 2018-08-15 LAB — GIARDIA ANTIGEN
MICRO NUMBER:: 91349372
RESULT:: NOT DETECTED
SPECIMEN QUALITY:: ADEQUATE

## 2018-08-15 LAB — BUN: BUN: 54 mg/dL — ABNORMAL HIGH (ref 7–25)

## 2018-08-15 NOTE — Telephone Encounter (Signed)
Please arrange follow-up in mid December with me. Let me know if you can't find a spot!

## 2018-08-15 NOTE — Telephone Encounter (Signed)
Patient scheduled and letter sent  °

## 2018-08-15 NOTE — Progress Notes (Signed)
Received the fax back that they received the info.  Having it scanned in.

## 2018-08-17 ENCOUNTER — Telehealth: Payer: Self-pay | Admitting: Gastroenterology

## 2018-08-17 NOTE — Telephone Encounter (Signed)
Please tell the patient that it is reassurring that he's not having large amounts of diarrhea. He should finish the Flagyl. He can start a probiotic (available OTC; he can pick up samples to start from our office). Call us if his symptoms get worse or if he is still having persistent symptoms after finishing Flagyl.  Make sure he is following the hand washing and home sanitizing recommendations we gave him.  Make sure he is drinking adequate fluids to stay hydrated.  Call if any questions or concerns.

## 2018-08-17 NOTE — Telephone Encounter (Signed)
Pt is on day 4 of Flagyl. He wants to know how long it should take for it to kick in and slow his diarrhea . He said he goes to the bathroom about every 30 min, and most of the time only does about a teaspoon full of diarrhea.  Please advise! He just wanted to let us know if he should stay on the Flagyl or if he needs to be changed. Forwarding to Walden Field, NP in Malin Boone's absence.

## 2018-08-17 NOTE — Telephone Encounter (Signed)
Pt called to let AB know that this is day 4 of 10 days that he has been taking his medicine and it isn't helping him any. Please advise. [phone]

## 2018-08-17 NOTE — Telephone Encounter (Signed)
PT is aware and he is doing all the things he is supposed to be doing. I am leaving samples of Restora at front desk for pick up.

## 2018-08-21 ENCOUNTER — Ambulatory Visit (INDEPENDENT_AMBULATORY_CARE_PROVIDER_SITE_OTHER): Payer: Medicare HMO | Admitting: *Deleted

## 2018-08-21 DIAGNOSIS — Z5181 Encounter for therapeutic drug level monitoring: Secondary | ICD-10-CM | POA: Diagnosis not present

## 2018-08-21 DIAGNOSIS — I4891 Unspecified atrial fibrillation: Secondary | ICD-10-CM

## 2018-08-21 LAB — POCT INR: INR: 2.2 (ref 2.0–3.0)

## 2018-08-21 NOTE — Patient Instructions (Signed)
On Flagyl 500mg  twice daily x 10 days for C-diff.  Will finish 11/22 morning.  Coumadin was decreased to 2mg  daily except 3mg  on Tuesdays and Fridays last week.  Continue this dose thru 11/22 then restart 3 tablets daily except 2 tablets on Sundays and Thursdays  Continue greens Recheck on 09/06/18

## 2018-08-22 ENCOUNTER — Telehealth: Payer: Self-pay | Admitting: Gastroenterology

## 2018-08-22 NOTE — Telephone Encounter (Signed)
Restora #[phone] at front. Pt is aware.

## 2018-08-22 NOTE — Telephone Encounter (Signed)
[phone]  PATIENT CALLED WANTING SOME MORE RESTORA SAMPLES, HE CAN NOT BUY ANY UNTIL HE GETS PAID AND HE SAID THEY WORKED VERY WELL.

## 2018-08-24 NOTE — Telephone Encounter (Signed)
Noted  

## 2018-08-28 ENCOUNTER — Telehealth: Payer: Self-pay | Admitting: Internal Medicine

## 2018-08-28 NOTE — Telephone Encounter (Signed)
Bethena Roys from [facility] radiology called asking about patient. She said he was scheduled a CT of Abd for tomorrow but someone named Ginger Aida Puffer cancelled his CT and patient is thinking he needs to come here to pick up his prep. I told Bethena Roys that MB was with a patient and I would have her call her and/or the patient back.

## 2018-08-28 NOTE — Telephone Encounter (Signed)
Per lab result note 08/14/18, AB wanted to hold off on CT d/t elevated creatinine. DS had informed pt of plan.  Called and informed Bethena Roys at radiology. Called and informed pt. Verbalized understanding.

## 2018-08-29 ENCOUNTER — Ambulatory Visit (HOSPITAL_COMMUNITY): Payer: Medicare HMO

## 2018-09-01 ENCOUNTER — Ambulatory Visit (HOSPITAL_COMMUNITY)
Admission: RE | Admit: 2018-09-01 | Discharge: 2018-09-01 | Disposition: A | Discharge status: Home / Self Care | Payer: Medicare HMO | Source: Ambulatory Visit | Attending: Pulmonary Disease | Admitting: Pulmonary Disease

## 2018-09-01 ENCOUNTER — Other Ambulatory Visit (HOSPITAL_COMMUNITY): Payer: Self-pay | Admitting: Pulmonary Disease

## 2018-09-01 DIAGNOSIS — S299XXA Unspecified injury of thorax, initial encounter: Secondary | ICD-10-CM | POA: Diagnosis not present

## 2018-09-01 DIAGNOSIS — Z95810 Presence of automatic (implantable) cardiac defibrillator: Secondary | ICD-10-CM | POA: Diagnosis not present

## 2018-09-01 DIAGNOSIS — I361 Nonrheumatic tricuspid (valve) insufficiency: Secondary | ICD-10-CM | POA: Diagnosis not present

## 2018-09-01 DIAGNOSIS — E1140 Type 2 diabetes mellitus with diabetic neuropathy, unspecified: Secondary | ICD-10-CM | POA: Diagnosis not present

## 2018-09-01 DIAGNOSIS — I442 Atrioventricular block, complete: Secondary | ICD-10-CM | POA: Diagnosis not present

## 2018-09-01 DIAGNOSIS — R531 Weakness: Secondary | ICD-10-CM | POA: Diagnosis not present

## 2018-09-01 DIAGNOSIS — K449 Diaphragmatic hernia without obstruction or gangrene: Secondary | ICD-10-CM | POA: Diagnosis not present

## 2018-09-01 DIAGNOSIS — F17210 Nicotine dependence, cigarettes, uncomplicated: Secondary | ICD-10-CM | POA: Diagnosis not present

## 2018-09-01 DIAGNOSIS — W19XXXA Unspecified fall, initial encounter: Secondary | ICD-10-CM

## 2018-09-01 DIAGNOSIS — Z7901 Long term (current) use of anticoagulants: Secondary | ICD-10-CM | POA: Diagnosis not present

## 2018-09-01 DIAGNOSIS — E872 Acidosis: Secondary | ICD-10-CM | POA: Diagnosis not present

## 2018-09-01 DIAGNOSIS — N179 Acute kidney failure, unspecified: Secondary | ICD-10-CM | POA: Diagnosis not present

## 2018-09-01 DIAGNOSIS — R34 Anuria and oliguria: Secondary | ICD-10-CM | POA: Diagnosis not present

## 2018-09-01 DIAGNOSIS — I10 Essential (primary) hypertension: Secondary | ICD-10-CM | POA: Diagnosis not present

## 2018-09-01 DIAGNOSIS — I370 Nonrheumatic pulmonary valve stenosis: Secondary | ICD-10-CM | POA: Diagnosis not present

## 2018-09-01 DIAGNOSIS — J449 Chronic obstructive pulmonary disease, unspecified: Secondary | ICD-10-CM | POA: Diagnosis not present

## 2018-09-01 DIAGNOSIS — N289 Disorder of kidney and ureter, unspecified: Secondary | ICD-10-CM | POA: Diagnosis not present

## 2018-09-01 DIAGNOSIS — R262 Difficulty in walking, not elsewhere classified: Secondary | ICD-10-CM | POA: Diagnosis not present

## 2018-09-01 DIAGNOSIS — R339 Retention of urine, unspecified: Secondary | ICD-10-CM | POA: Diagnosis not present

## 2018-09-01 DIAGNOSIS — I4891 Unspecified atrial fibrillation: Secondary | ICD-10-CM | POA: Diagnosis not present

## 2018-09-01 DIAGNOSIS — R269 Unspecified abnormalities of gait and mobility: Secondary | ICD-10-CM | POA: Diagnosis not present

## 2018-09-01 DIAGNOSIS — I130 Hypertensive heart and chronic kidney disease with heart failure and stage 1 through stage 4 chronic kidney disease, or unspecified chronic kidney disease: Secondary | ICD-10-CM | POA: Diagnosis not present

## 2018-09-01 DIAGNOSIS — E1122 Type 2 diabetes mellitus with diabetic chronic kidney disease: Secondary | ICD-10-CM | POA: Diagnosis not present

## 2018-09-01 DIAGNOSIS — R0781 Pleurodynia: Secondary | ICD-10-CM | POA: Insufficient documentation

## 2018-09-01 DIAGNOSIS — I872 Venous insufficiency (chronic) (peripheral): Secondary | ICD-10-CM | POA: Diagnosis not present

## 2018-09-01 DIAGNOSIS — I5042 Chronic combined systolic (congestive) and diastolic (congestive) heart failure: Secondary | ICD-10-CM | POA: Diagnosis not present

## 2018-09-01 DIAGNOSIS — I252 Old myocardial infarction: Secondary | ICD-10-CM | POA: Diagnosis not present

## 2018-09-01 DIAGNOSIS — E875 Hyperkalemia: Secondary | ICD-10-CM | POA: Diagnosis not present

## 2018-09-01 DIAGNOSIS — N183 Chronic kidney disease, stage 3 (moderate): Secondary | ICD-10-CM | POA: Diagnosis not present

## 2018-09-01 DIAGNOSIS — I340 Nonrheumatic mitral (valve) insufficiency: Secondary | ICD-10-CM | POA: Diagnosis not present

## 2018-09-01 DIAGNOSIS — J9811 Atelectasis: Secondary | ICD-10-CM | POA: Diagnosis not present

## 2018-09-01 DIAGNOSIS — E1165 Type 2 diabetes mellitus with hyperglycemia: Secondary | ICD-10-CM | POA: Diagnosis not present

## 2018-09-01 DIAGNOSIS — J90 Pleural effusion, not elsewhere classified: Secondary | ICD-10-CM | POA: Diagnosis not present

## 2018-09-01 DIAGNOSIS — F1020 Alcohol dependence, uncomplicated: Secondary | ICD-10-CM | POA: Diagnosis not present

## 2018-09-01 DIAGNOSIS — Z8249 Family history of ischemic heart disease and other diseases of the circulatory system: Secondary | ICD-10-CM | POA: Diagnosis not present

## 2018-09-01 DIAGNOSIS — E785 Hyperlipidemia, unspecified: Secondary | ICD-10-CM | POA: Diagnosis not present

## 2018-09-01 DIAGNOSIS — R4781 Slurred speech: Secondary | ICD-10-CM | POA: Diagnosis not present

## 2018-09-01 DIAGNOSIS — I2510 Atherosclerotic heart disease of native coronary artery without angina pectoris: Secondary | ICD-10-CM | POA: Diagnosis not present

## 2018-09-01 DIAGNOSIS — I480 Paroxysmal atrial fibrillation: Secondary | ICD-10-CM | POA: Diagnosis not present

## 2018-09-04 ENCOUNTER — Inpatient Hospital Stay (HOSPITAL_COMMUNITY)
Admission: EM | Admit: 2018-09-04 | Discharge: 2018-09-07 | DRG: 683 | Disposition: A | Discharge status: Home Health | Payer: Medicare HMO | Attending: Internal Medicine | Admitting: Internal Medicine

## 2018-09-04 ENCOUNTER — Emergency Department (HOSPITAL_COMMUNITY): Payer: Medicare HMO

## 2018-09-04 ENCOUNTER — Other Ambulatory Visit: Payer: Self-pay

## 2018-09-04 ENCOUNTER — Encounter (HOSPITAL_COMMUNITY): Payer: Self-pay

## 2018-09-04 DIAGNOSIS — E875 Hyperkalemia: Secondary | ICD-10-CM

## 2018-09-04 DIAGNOSIS — I252 Old myocardial infarction: Secondary | ICD-10-CM

## 2018-09-04 DIAGNOSIS — N183 Chronic kidney disease, stage 3 (moderate): Secondary | ICD-10-CM | POA: Diagnosis present

## 2018-09-04 DIAGNOSIS — E119 Type 2 diabetes mellitus without complications: Secondary | ICD-10-CM

## 2018-09-04 DIAGNOSIS — R0902 Hypoxemia: Secondary | ICD-10-CM

## 2018-09-04 DIAGNOSIS — Z955 Presence of coronary angioplasty implant and graft: Secondary | ICD-10-CM

## 2018-09-04 DIAGNOSIS — N179 Acute kidney failure, unspecified: Secondary | ICD-10-CM | POA: Diagnosis present

## 2018-09-04 DIAGNOSIS — I4891 Unspecified atrial fibrillation: Secondary | ICD-10-CM | POA: Diagnosis not present

## 2018-09-04 DIAGNOSIS — I442 Atrioventricular block, complete: Secondary | ICD-10-CM | POA: Diagnosis present

## 2018-09-04 DIAGNOSIS — I480 Paroxysmal atrial fibrillation: Secondary | ICD-10-CM | POA: Diagnosis present

## 2018-09-04 DIAGNOSIS — Z85038 Personal history of other malignant neoplasm of large intestine: Secondary | ICD-10-CM

## 2018-09-04 DIAGNOSIS — Z7901 Long term (current) use of anticoagulants: Secondary | ICD-10-CM | POA: Diagnosis not present

## 2018-09-04 DIAGNOSIS — E1140 Type 2 diabetes mellitus with diabetic neuropathy, unspecified: Secondary | ICD-10-CM | POA: Diagnosis present

## 2018-09-04 DIAGNOSIS — I5042 Chronic combined systolic (congestive) and diastolic (congestive) heart failure: Secondary | ICD-10-CM | POA: Diagnosis present

## 2018-09-04 DIAGNOSIS — F1020 Alcohol dependence, uncomplicated: Secondary | ICD-10-CM | POA: Diagnosis present

## 2018-09-04 DIAGNOSIS — R339 Retention of urine, unspecified: Secondary | ICD-10-CM | POA: Diagnosis not present

## 2018-09-04 DIAGNOSIS — E1165 Type 2 diabetes mellitus with hyperglycemia: Secondary | ICD-10-CM | POA: Diagnosis present

## 2018-09-04 DIAGNOSIS — I130 Hypertensive heart and chronic kidney disease with heart failure and stage 1 through stage 4 chronic kidney disease, or unspecified chronic kidney disease: Secondary | ICD-10-CM | POA: Diagnosis present

## 2018-09-04 DIAGNOSIS — Z95810 Presence of automatic (implantable) cardiac defibrillator: Secondary | ICD-10-CM | POA: Diagnosis not present

## 2018-09-04 DIAGNOSIS — Z8249 Family history of ischemic heart disease and other diseases of the circulatory system: Secondary | ICD-10-CM | POA: Diagnosis not present

## 2018-09-04 DIAGNOSIS — E785 Hyperlipidemia, unspecified: Secondary | ICD-10-CM | POA: Diagnosis present

## 2018-09-04 DIAGNOSIS — Z833 Family history of diabetes mellitus: Secondary | ICD-10-CM

## 2018-09-04 DIAGNOSIS — I370 Nonrheumatic pulmonary valve stenosis: Secondary | ICD-10-CM | POA: Diagnosis not present

## 2018-09-04 DIAGNOSIS — Z79899 Other long term (current) drug therapy: Secondary | ICD-10-CM

## 2018-09-04 DIAGNOSIS — E1122 Type 2 diabetes mellitus with diabetic chronic kidney disease: Secondary | ICD-10-CM | POA: Diagnosis present

## 2018-09-04 DIAGNOSIS — Z9049 Acquired absence of other specified parts of digestive tract: Secondary | ICD-10-CM

## 2018-09-04 DIAGNOSIS — R34 Anuria and oliguria: Secondary | ICD-10-CM | POA: Diagnosis present

## 2018-09-04 DIAGNOSIS — E872 Acidosis: Secondary | ICD-10-CM | POA: Diagnosis present

## 2018-09-04 DIAGNOSIS — I2510 Atherosclerotic heart disease of native coronary artery without angina pectoris: Secondary | ICD-10-CM | POA: Diagnosis present

## 2018-09-04 DIAGNOSIS — F17210 Nicotine dependence, cigarettes, uncomplicated: Secondary | ICD-10-CM | POA: Diagnosis present

## 2018-09-04 DIAGNOSIS — J449 Chronic obstructive pulmonary disease, unspecified: Secondary | ICD-10-CM | POA: Diagnosis present

## 2018-09-04 DIAGNOSIS — Z800 Family history of malignant neoplasm of digestive organs: Secondary | ICD-10-CM

## 2018-09-04 DIAGNOSIS — Z881 Allergy status to other antibiotic agents status: Secondary | ICD-10-CM

## 2018-09-04 DIAGNOSIS — K449 Diaphragmatic hernia without obstruction or gangrene: Secondary | ICD-10-CM | POA: Diagnosis present

## 2018-09-04 DIAGNOSIS — I872 Venous insufficiency (chronic) (peripheral): Secondary | ICD-10-CM | POA: Diagnosis present

## 2018-09-04 DIAGNOSIS — Z794 Long term (current) use of insulin: Secondary | ICD-10-CM

## 2018-09-04 DIAGNOSIS — I361 Nonrheumatic tricuspid (valve) insufficiency: Secondary | ICD-10-CM | POA: Diagnosis not present

## 2018-09-04 DIAGNOSIS — I340 Nonrheumatic mitral (valve) insufficiency: Secondary | ICD-10-CM | POA: Diagnosis not present

## 2018-09-04 LAB — CBC WITH DIFFERENTIAL/PLATELET
Abs Immature Granulocytes: 0.03 10*3/uL (ref 0.00–0.07)
Basophils Absolute: 0 10*3/uL (ref 0.0–0.1)
Basophils Relative: 1 %
Eosinophils Absolute: 0.3 10*3/uL (ref 0.0–0.5)
Eosinophils Relative: 5 %
HCT: 32.5 % — ABNORMAL LOW (ref 39.0–52.0)
Hemoglobin: 10.1 g/dL — ABNORMAL LOW (ref 13.0–17.0)
Immature Granulocytes: 0 %
Lymphocytes Relative: 16 %
Lymphs Abs: 1.1 10*3/uL (ref 0.7–4.0)
MCH: 29.4 pg (ref 26.0–34.0)
MCHC: 31.1 g/dL (ref 30.0–36.0)
MCV: 94.8 fL (ref 80.0–100.0)
MONO ABS: 0.5 10*3/uL (ref 0.1–1.0)
Monocytes Relative: 8 %
Neutro Abs: 4.9 10*3/uL (ref 1.7–7.7)
Neutrophils Relative %: 70 %
Platelets: 173 10*3/uL (ref 150–400)
RBC: 3.43 MIL/uL — ABNORMAL LOW (ref 4.22–5.81)
RDW: 15.6 % — ABNORMAL HIGH (ref 11.5–15.5)
WBC: 6.9 10*3/uL (ref 4.0–10.5)
nRBC: 0 % (ref 0.0–0.2)

## 2018-09-04 LAB — BLOOD GAS, VENOUS
ACID-BASE DEFICIT: 17.2 mmol/L — AB (ref 0.0–2.0)
Bicarbonate: 11 mmol/L — ABNORMAL LOW (ref 20.0–28.0)
FIO2: 0.21
O2 Saturation: 90.8 %
PH VEN: 7.102 — AB (ref 7.250–7.430)
Patient temperature: 37
pCO2, Ven: 35.2 mmHg — ABNORMAL LOW (ref 44.0–60.0)
pO2, Ven: 64.1 mmHg — ABNORMAL HIGH (ref 32.0–45.0)

## 2018-09-04 LAB — COMPREHENSIVE METABOLIC PANEL
ALT: 17 U/L (ref 0–44)
AST: 16 U/L (ref 15–41)
Albumin: 3.1 g/dL — ABNORMAL LOW (ref 3.5–5.0)
Alkaline Phosphatase: 90 U/L (ref 38–126)
Anion gap: 5 (ref 5–15)
BUN: 83 mg/dL — ABNORMAL HIGH (ref 8–23)
CO2: 11 mmol/L — ABNORMAL LOW (ref 22–32)
Calcium: 7.8 mg/dL — ABNORMAL LOW (ref 8.9–10.3)
Chloride: 114 mmol/L — ABNORMAL HIGH (ref 98–111)
Creatinine, Ser: 4.77 mg/dL — ABNORMAL HIGH (ref 0.61–1.24)
GFR calc Af Amer: 14 mL/min — ABNORMAL LOW (ref >60)
GFR calc non Af Amer: 12 mL/min — ABNORMAL LOW (ref >60)
Glucose, Bld: 122 mg/dL — ABNORMAL HIGH (ref 70–99)
Potassium: >7.5 mmol/L (ref 3.5–5.1)
Sodium: 130 mmol/L — ABNORMAL LOW (ref 135–145)
Total Bilirubin: 0.3 mg/dL (ref 0.3–1.2)
Total Protein: 6.3 g/dL — ABNORMAL LOW (ref 6.5–8.1)

## 2018-09-04 LAB — BASIC METABOLIC PANEL
Anion gap: 8 (ref 5–15)
BUN: 85 mg/dL — AB (ref 8–23)
CO2: 12 mmol/L — ABNORMAL LOW (ref 22–32)
Calcium: 8.1 mg/dL — ABNORMAL LOW (ref 8.9–10.3)
Chloride: 111 mmol/L (ref 98–111)
Creatinine, Ser: 4.6 mg/dL — ABNORMAL HIGH (ref 0.61–1.24)
GFR calc Af Amer: 15 mL/min — ABNORMAL LOW (ref >60)
GFR calc non Af Amer: 13 mL/min — ABNORMAL LOW (ref >60)
Glucose, Bld: 145 mg/dL — ABNORMAL HIGH (ref 70–99)
POTASSIUM: 6.7 mmol/L — AB (ref 3.5–5.1)
Sodium: 131 mmol/L — ABNORMAL LOW (ref 135–145)

## 2018-09-04 LAB — CBG MONITORING, ED
GLUCOSE-CAPILLARY: 200 mg/dL — AB (ref 70–99)
Glucose-Capillary: 108 mg/dL — ABNORMAL HIGH (ref 70–99)
Glucose-Capillary: 121 mg/dL — ABNORMAL HIGH (ref 70–99)
Glucose-Capillary: 206 mg/dL — ABNORMAL HIGH (ref 70–99)

## 2018-09-04 LAB — URINALYSIS, ROUTINE W REFLEX MICROSCOPIC
Bilirubin Urine: NEGATIVE
Glucose, UA: NEGATIVE mg/dL
Hgb urine dipstick: NEGATIVE
Ketones, ur: NEGATIVE mg/dL
Nitrite: NEGATIVE
Protein, ur: 30 mg/dL — AB
Specific Gravity, Urine: 1.015 (ref 1.005–1.030)
pH: 5 (ref 5.0–8.0)

## 2018-09-04 LAB — POTASSIUM: Potassium: >7.5 mmol/L (ref 3.5–5.1)

## 2018-09-04 LAB — PROTIME-INR
INR: 1.61
Prothrombin Time: 18.9 seconds — ABNORMAL HIGH (ref 11.4–15.2)

## 2018-09-04 LAB — BRAIN NATRIURETIC PEPTIDE: B NATRIURETIC PEPTIDE 5: 605 pg/mL — AB (ref 0.0–100.0)

## 2018-09-04 LAB — LACTIC ACID, PLASMA
Lactic Acid, Venous: 0.5 mmol/L (ref 0.5–1.9)
Lactic Acid, Venous: 0.8 mmol/L (ref 0.5–1.9)

## 2018-09-04 LAB — I-STAT TROPONIN, ED: Troponin i, poc: 0 ng/mL (ref 0.00–0.08)

## 2018-09-04 LAB — TROPONIN I: Troponin I: <0.03 ng/mL (ref <0.03)

## 2018-09-04 LAB — AMMONIA: AMMONIA: 18 umol/L (ref 9–35)

## 2018-09-04 LAB — LIPASE, BLOOD: Lipase: 105 U/L — ABNORMAL HIGH (ref 11–51)

## 2018-09-04 LAB — TSH: TSH: 1.026 u[IU]/mL (ref 0.350–4.500)

## 2018-09-04 MED ORDER — SODIUM ZIRCONIUM CYCLOSILICATE 10 G PO PACK
10.0000 g | PACK | Freq: Once | ORAL | Status: AC
  Administered 2018-09-04: 10 g via ORAL
  Filled 2018-09-04: qty 1

## 2018-09-04 MED ORDER — SODIUM ZIRCONIUM CYCLOSILICATE 10 G PO PACK
10.0000 g | PACK | Freq: Once | ORAL | Status: DC
  Filled 2018-09-04: qty 1

## 2018-09-04 MED ORDER — LACTATED RINGERS IV BOLUS
1000.0000 mL | Freq: Once | INTRAVENOUS | Status: AC
  Administered 2018-09-04: 1000 mL via INTRAVENOUS

## 2018-09-04 MED ORDER — SODIUM BICARBONATE 8.4 % IV SOLN
50.0000 meq | Freq: Once | INTRAVENOUS | Status: AC
  Administered 2018-09-04: 50 meq via INTRAVENOUS
  Filled 2018-09-04: qty 50

## 2018-09-04 MED ORDER — SODIUM BICARBONATE 8.4 % IV SOLN
INTRAVENOUS | Status: DC
  Administered 2018-09-04: 20:00:00 via INTRAVENOUS
  Filled 2018-09-04 (×3): qty 150

## 2018-09-04 MED ORDER — DEXTROSE 50 % IV SOLN
INTRAVENOUS | Status: AC
  Filled 2018-09-04: qty 50

## 2018-09-04 MED ORDER — SODIUM BICARBONATE 8.4 % IV SOLN
INTRAVENOUS | Status: AC
  Filled 2018-09-04: qty 100

## 2018-09-04 MED ORDER — SODIUM BICARBONATE 8.4 % IV SOLN
INTRAVENOUS | Status: DC
  Filled 2018-09-04 (×2): qty 150

## 2018-09-04 MED ORDER — DEXTROSE 50 % IV SOLN
1.0000 | Freq: Once | INTRAVENOUS | Status: AC
  Administered 2018-09-04: 50 mL via INTRAVENOUS

## 2018-09-04 MED ORDER — DEXTROSE 10 % IV SOLN
Freq: Once | INTRAVENOUS | Status: AC
  Administered 2018-09-04: 18:00:00 via INTRAVENOUS

## 2018-09-04 MED ORDER — SODIUM BICARBONATE 8.4 % IV SOLN
INTRAVENOUS | Status: AC
  Filled 2018-09-04: qty 50

## 2018-09-04 MED ORDER — ALBUTEROL SULFATE (2.5 MG/3ML) 0.083% IN NEBU
10.0000 mg | INHALATION_SOLUTION | Freq: Once | RESPIRATORY_TRACT | Status: AC
  Administered 2018-09-04: 10 mg via RESPIRATORY_TRACT
  Filled 2018-09-04: qty 12

## 2018-09-04 MED ORDER — SODIUM BICARBONATE 8.4 % IV SOLN
INTRAVENOUS | Status: DC
  Filled 2018-09-04 (×2): qty 100

## 2018-09-04 MED ORDER — INSULIN ASPART 100 UNIT/ML IV SOLN
5.0000 [IU] | Freq: Once | INTRAVENOUS | Status: AC
  Administered 2018-09-04: 5 [IU] via INTRAVENOUS

## 2018-09-04 MED ORDER — CALCIUM GLUCONATE-NACL 1-0.675 GM/50ML-% IV SOLN
1.0000 g | Freq: Once | INTRAVENOUS | Status: AC
  Administered 2018-09-04: 1000 mg via INTRAVENOUS
  Filled 2018-09-04: qty 50

## 2018-09-04 MED ORDER — FUROSEMIDE 10 MG/ML IJ SOLN
40.0000 mg | Freq: Once | INTRAMUSCULAR | Status: AC
  Administered 2018-09-04: 40 mg via INTRAVENOUS
  Filled 2018-09-04: qty 4

## 2018-09-04 MED ORDER — CALCIUM GLUCONATE 10 % IV SOLN
1.0000 g | Freq: Once | INTRAVENOUS | Status: DC
  Filled 2018-09-04: qty 10

## 2018-09-04 MED ORDER — SODIUM ZIRCONIUM CYCLOSILICATE 10 G PO PACK: 10.0000 g | PACK | Freq: Once | ORAL | Status: DC

## 2018-09-04 MED ORDER — DEXTROSE 50 % IV SOLN
50.0000 mL | Freq: Once | INTRAVENOUS | Status: AC
  Administered 2018-09-04: 50 mL via INTRAVENOUS

## 2018-09-04 MED ORDER — INSULIN ASPART 100 UNIT/ML ~~LOC~~ SOLN
5.0000 [IU] | Freq: Once | SUBCUTANEOUS | Status: AC
  Administered 2018-09-04: 5 [IU] via SUBCUTANEOUS
  Filled 2018-09-04: qty 1

## 2018-09-04 NOTE — ED Triage Notes (Signed)
EMS reports pt has c/o generalized weakness and involuntary shaking in r arm.  Reports has fallen several times over the past week.  Pt from home and lives with family.  EMS says pt started taking an otc medication called"restora" recently.  EMS says pt also has had a cough that started this morning.

## 2018-09-04 NOTE — ED Notes (Signed)
Date and time results received: 09/04/18 2105 (use smartphrase ".now" to insert current time)  Test: potassium  Critical Value: 6.7  Name of Provider Notified: Dr Denton Brick  Orders Received? Or Actions Taken?: Actions Taken: no orders received

## 2018-09-04 NOTE — ED Notes (Signed)
CRITICAL VALUE ALERT  Critical Value:  k greater than 7.5  Date & Time Notied: 09/04/2018, 1627  Provider Notified: Dr. Dayna Barker,  Orders Received/Actions taken: see chart

## 2018-09-04 NOTE — ED Notes (Signed)
CRITICAL VALUE ALERT  Critical Value:  PH 7.102  Date & Time Notied:  09/04/2018, 1538  Provider Notified: Dr. Dayna Barker  Orders Received/Actions taken: See chart

## 2018-09-04 NOTE — H&P (Addendum)
History and Physical    [name] HEN:[id number] DOB: [birth year] DOA: 09/04/2018  PCP: Sinda Du, MD   Patient coming from: Home  Chief Complaint: Right hand shaking, Generalized weakness  HPI: [name] is a 62 y.o. male with medical history significant for COPD, CKD, HTN, CHF- systolic and diastolic, AICD status, Atria fibrillation, who presented to the ED with complaints of involuntary shaking of his right hand over the past few days with multiple falls over the past week resulting from generalized weakness for the past 3 weeks.  Up until yesterday he was compliant with his Lasix, metolazone and lisinopril, and warfarin.  Patient reports he has not made urine since last night.  He denies nausea vomiting or abdominal pain.  Has maintained good p.o. intake. Recent diagnosis of C. difficile diarrhea 08/14/18, was prescribed Flagyl and started on a probiotic, he says this helped with the diarrhea, he denies stools over the past few days no vomiting no nausea no abdominal pain. Denies NSAID use.   ED Course: Systolic 36R to 443, heart rate 50s to 70s O2 sats greater than 91% on room air.  Potassium elevated at > 7.5, creatinine elevated 4.7, sodium 130, ammonia- 18.  BNP 605.  Head CT negative for acute intracranial abnormality.  2 view chest x-ray no acute abnormalities.  UA pending.  Patient was given IV bicarb bolus then started on gtt, 5 units NovoLog, D50 and D10, calcium gluconate x2, sodium zirconium cyclosilicate.  Nephrologist on-call Dr. Justin Mend consulted recommended admission to [facility] stepdown bed to be notified when patient arrives at [facility].   Review of Systems: As per HPI all other systems reviewed and negative.  Past Medical History:  Diagnosis Date  . A-fib ([place])   . Anemia, normocytic normochromic 2008   2008  . Arteriosclerotic cardiovascular disease (ASCVD)    Acute MI in 2006->  LAD stent, EF-37%  . Arthritis    Hx: of in hands  . Automatic  implantable cardioverter-defibrillator in situ   . Carpal tunnel syndrome   . Chronic systolic (congestive) heart failure (HCC)    a. EF 20-25% by echo in 03/2017  . Colon cancer ([place]) 2006   Partial colectomy in 2006  . Complete heart block ([place])    S/P ICD 02/09/13  . COPD (chronic obstructive pulmonary disease) ([place])   . Coronary artery disease   . Diabetes mellitus, type II ([place])    With peripheral neuropathy  . Edema    venous insufficiency  . H/O alcohol dependence ([place])   . H/O hiatal hernia   . Hyperlipidemia   . Hypertension   . Myocardial infarction [facility])    Hx: of 2006  . Neuropathy in diabetes ([place])    Hx: of  . Pacemaker   . Pneumonia 01/2012   01/2012; subsequent admission for chest pain  . Shortness of breath    Hx: of   . Tobacco abuse     Past Surgical History:  Procedure Laterality Date  . ANKLE SURGERY     Right  . Biventricular ICD Placement  02/09/2013   Dr. Lovena Le  . COLONOSCOPY  08/17/2005   Pancolonic diverticula/Multiple rectal polyps removed as described above. The larger of the two likely responsible for intermittent hematochezia/ Multiple polyps at the hepatic flexure resected with a snare./ Large polypoid lesion growing out of the base of the cecum, just behind the  ileocecal valve. This was not felt to be amendable to endoscopic resection. Biopsied multiple times  .  CORONARY ANGIOPLASTY WITH STENT PLACEMENT    . ESOPHAGOGASTRODUODENOSCOPY N/A 03/29/2017   Barrett's esophagus, gastritis, duodenitis, focal glandular atypia on biopsy. Needs surveillance June 2019   . FLEXIBLE SIGMOIDOSCOPY    06/13/2006   Essentially normal residual rectum status post total colectomy. anastomosis at 25cm.  Focal area of abnormality adjacent to suture, as described above, likely not significant, suspect granulation tissue, biopsied.  Anastomosis otherwise appeared normal.  Distal terminal ileum appeared normal  . FLEXIBLE SIGMOIDOSCOPY N/A 01/13/2015   Dr. Gala Romney:  normal-appearing residual rectum, surgical anastomosis, s/p subtotal colectomy. Surveillance 5 years   . FLEXIBLE SIGMOIDOSCOPY N/A 03/29/2017   Dr. Oneida Alar while inpatient: intermittent rectal bleeding due to ischemic erosion at anastomosis,s/p APC therapy. one 3 mm polyp in recum (hyperplastic). surveillance in 3 years  . INGUINAL HERNIA REPAIR    . MULTIPLE EXTRACTIONS WITH ALVEOLOPLASTY N/A 04/09/2013   Procedure: Extraction of tooth #'s 1,2,4,5,6,7,8,9,10,11,12,13,17,18,20,21,22,23,27,28, 29,30,31, and 32 with alveoloplasty.;  Surgeon: Lenn Cal, DDS;  Location: [facility];  Service: Oral Surgery;  Laterality: N/A;  . PERMANENT PACEMAKER INSERTION N/A 02/09/2013   Procedure: PERMANENT PACEMAKER INSERTION;  Surgeon: Evans Lance, MD;  Location: [facility] CATH LAB;  Service: Cardiovascular;  Laterality: N/A;  . SUBTOTAL COLECTOMY  09/01/2005   subtotal colectomy  . TEMPORARY PACEMAKER INSERTION Right 02/09/2013   Procedure: TEMPORARY PACEMAKER INSERTION;  Surgeon: Sinclair Grooms, MD;  Location: [facility] CATH LAB;  Service: Cardiovascular;  Laterality: Right;     reports that he has been smoking cigarettes. He started smoking about 47 years ago. He has a 40.00 pack-year smoking history. He has never used smokeless tobacco. He reports that he does not drink alcohol or use drugs.  Allergies  Allergen Reactions  . Vancomycin Itching and Rash    Family History  Problem Relation Age of Onset  . Colon cancer Mother        Jobe Mutch, ?>age 61.  [name] Heart disease Mother   . Heart disease Father   . Hypertension Brother   . Heart disease Brother   . Diabetes Brother   . Liver disease Neg Hx     Prior to Admission medications   Medication Sig Start Date End Date Taking? Authorizing Provider  acetaminophen (TYLENOL) 500 MG tablet Take 1 tablet by mouth as needed. 01/08/14  Yes [provider]  albuterol (PROVENTIL HFA;VENTOLIN HFA) 108 (90 BASE) MCG/ACT inhaler Inhale 2 puffs into the lungs  every 6 (six) hours as needed. For shortness of breath   Yes [provider]  albuterol (PROVENTIL) (2.5 MG/3ML) 0.083% nebulizer solution Take 2.5 mg by nebulization every 6 (six) hours as needed. For shortness of breath   Yes [provider]  carvedilol (COREG) 6.25 MG tablet Take 6.25 mg by mouth 2 (two) times daily with a meal.  05/04/12  Yes [provider]  ferrous sulfate 325 (65 FE) MG EC tablet Take 325 mg by mouth 2 (two) times daily.   Yes [provider]  furosemide (LASIX) 40 MG tablet TAKE 1 TABLET IN THE MORNING  AND TAKE 1/2 TABLET IN THE EVENING Patient taking differently: Take 40 mg by mouth 2 (two) times daily.  03/21/18  Yes Lendon Colonel, NP  gabapentin (NEURONTIN) 300 MG capsule Take 300 mg by mouth at bedtime.   Yes [provider]  HYDROcodone-acetaminophen (NORCO/VICODIN) 5-325 MG tablet Take 1 tablet by mouth every 6 (six) hours as needed. 08/23/18  Yes [provider]  hydrocortisone (ANUSOL-HC) 2.5 %  rectal cream Place 1 application rectally 2 (two) times daily. Patient taking differently: Place 1 application rectally 2 (two) times daily as needed.  08/10/18  Yes Annitta Needs, NP  lisinopril (PRINIVIL,ZESTRIL) 10 MG tablet Take 1 tablet by mouth daily. 03/08/17  Yes [provider]  magnesium oxide (MAG-OX) 400 MG tablet Take 400 mg by mouth 2 (two) times daily.   Yes [provider]  metFORMIN (GLUCOPHAGE) 500 MG tablet Take 1,000 mg by mouth 2 (two) times daily with a meal.   Yes [provider]  metolazone (ZAROXOLYN) 2.5 MG tablet TAKE 1 TABLET (2.5 MG) ON SUNDAY AND WEDNESDAY ONLY Patient taking differently: Take 2.5 mg by mouth 2 (two) times a week. Sundays and Wednesdays only 04/18/18  Yes Evans Lance, MD  Multiple Vitamin (MULTIVITAMIN) tablet Take 1 tablet by mouth daily.   Yes [provider]  nitroGLYCERIN (NITROSTAT) 0.4 MG SL tablet Place 1 tablet (0.4 mg total) under  the tongue every 5 (five) minutes x 3 doses as needed. For chest pain 03/08/18  Yes Evans Lance, MD  ondansetron (ZOFRAN) 4 MG tablet Take 4 mg by mouth 4 (four) times daily as needed for nausea or vomiting.   Yes [provider]  pantoprazole (PROTONIX) 40 MG tablet Take 1 tablet (40 mg total) by mouth daily before breakfast. 03/31/17  Yes Sinda Du, MD  primidone (MYSOLINE) 50 MG tablet Take 50 mg by mouth daily.    Yes [provider]  Probiotic Product (RESTORA) CAPS Take 1 capsule by mouth daily.   Yes [provider]  sertraline (ZOLOFT) 50 MG tablet Take 50 mg by mouth daily.   Yes [provider]  simvastatin (ZOCOR) 80 MG tablet Take 40 mg by mouth at bedtime.   Yes [provider]  tiZANidine (ZANAFLEX) 4 MG tablet Take 4 mg by mouth 3 (three) times daily.   Yes [provider]  warfarin (COUMADIN) 4 MG tablet Take 3 tablets daily except 2 tablets on Sundays and Thursdays Patient taking differently: Take 8-12 mg by mouth See admin instructions. Take 12mg   daily except 8mg  on Sundays and Thursdays 03/01/18  Yes Evans Lance, MD  insulin glargine (LANTUS) 100 UNIT/ML injection Inject 20 Units into the skin 2 (two) times daily.     [provider]    Physical Exam: Vitals:   09/04/18 1700 09/04/18 1730 09/04/18 1800 09/04/18 1830  BP: 107/69 96/83 94/76  132/64  Pulse: (!) 59 68 70   Resp: 12 20 12 14   SpO2: 91% 97% 96%   Weight:      Height:        Constitutional: Patient deeply asleep, maintaining airway, woke up vigorous attempt, once a awake answered all questions appropriately, cooperative with exam, calm, comfortable. Vitals:   09/04/18 1700 09/04/18 1730 09/04/18 1800 09/04/18 1830  BP: 107/69 96/83 94/76  132/64  Pulse: (!) 59 68 70   Resp: 12 20 12 14   SpO2: 91% 97% 96%   Weight:      Height:       Eyes: PERRL, lids and conjunctivae normal ENMT: Mucous membranes are moist. Posterior pharynx clear  of any exudate or lesions. Neck: normal, supple, no masses, no thyromegaly Respiratory: clear to auscultation bilaterally, no wheezing, no crackles. Normal respiratory effort. No accessory muscle use.  Cardiovascular: Regular rate and rhythm, no murmurs / rubs / gallops. No extremity edema. 2+ pedal pulses.  Abdomen: no tenderness, no masses palpated. No hepatosplenomegaly. Bowel  sounds positive.  Musculoskeletal: no clubbing / cyanosis. No joint deformity upper and lower extremities. Good ROM, no contractures. Normal muscle tone.  Positive asterixis Skin: no rashes, lesions, ulcers. No induration Neurologic: CN 2-12 grossly intact.  Strength 5/5 in all 4.  Psychiatric: Normal judgment and insight. Alert and oriented x 3. Normal mood.   Labs on Admission: I have personally reviewed following labs and imaging studies  CBC: Recent Labs  Lab 09/04/18 1524  WBC 6.9  NEUTROABS 4.9  HGB 10.1*  HCT 32.5*  MCV 94.8  PLT 062   Basic Metabolic Panel: Recent Labs  Lab 09/04/18 1524 09/04/18 1653  NA 130*  --   K >7.5* >7.5*  CL 114*  --   CO2 11*  --   GLUCOSE 122*  --   BUN 83*  --   CREATININE 4.77*  --   CALCIUM 7.8*  --    Liver Function Tests: Recent Labs  Lab 09/04/18 1524  AST 16  ALT 17  ALKPHOS 90  BILITOT 0.3  PROT 6.3*  ALBUMIN 3.1*   Recent Labs  Lab 09/04/18 1524  LIPASE 105*   Recent Labs  Lab 09/04/18 1535  AMMONIA 18   Coagulation Profile: Recent Labs  Lab 09/04/18 1524  INR 1.61   Cardiac Enzymes: Recent Labs  Lab 09/04/18 1524  TROPONINI <0.03   CBG: Recent Labs  Lab 09/04/18 1617 09/04/18 1830  GLUCAP 108* 121*   Thyroid Function Tests: Recent Labs    09/04/18 1548  TSH 1.026    Radiological Exams on Admission: Dg Chest 2 View  Result Date: 09/04/2018 CLINICAL DATA:  Weakness. EXAM: CHEST - 2 VIEW COMPARISON:  09/01/2018 FINDINGS: Left chest wall ICD is noted with leads in the right atrial appendage, and right ventricle.  Mild cardiac enlargement. There is asymmetric elevation of the right hemidiaphragm. No airspace opacities identified. IMPRESSION: 1. No acute cardiopulmonary abnormalities. Electronically Signed   By: Kerby Moors M.D.   On: 09/04/2018 16:31   Ct Head Wo Contrast  Result Date: 09/04/2018 CLINICAL DATA:  62 y/o M; generalized weakness and involuntary shaking of the right arm. Patient is fallen several times over the past week. EXAM: CT HEAD WITHOUT CONTRAST TECHNIQUE: Contiguous axial images were obtained from the base of the skull through the vertex without intravenous contrast. COMPARISON:  01/27/2014 CT head FINDINGS: Brain: No evidence of acute infarction, hemorrhage, hydrocephalus, extra-axial collection or mass lesion/mass effect. Few stable nonspecific white matter hypodensities compatible with mild chronic microvascular ischemic changes. Stable mild volume loss of the brain. Vascular: Calcific atherosclerosis of carotid siphons and vertebral arteries. Skull: Normal. Negative for fracture or focal lesion. Sinuses/Orbits: No acute finding. Other: None. IMPRESSION: 1. No acute intracranial abnormality identified. 2. Stable mild chronic microvascular ischemic changes and mild volume loss of the brain. Electronically Signed   By: Kristine Garbe M.D.   On: 09/04/2018 16:45    EKG: Independently reviewed.  Atrial and ventricular dual pacing, T waves more prominent in V5 only, very similar to prior EKG.    Assessment/Plan Active Problems:   Diabetes mellitus, type II (HCC)   Atrial fibrillation ([place])   ARF (acute renal failure) (HCC)   Acute renal failure on CKD-creatinine 4.7, recent Cr 11/8- 2.3.  With hyperkalemia, metabolic acidosis.  Asterixis.  Anuric- 24hrs. Nephrology consulted Dr. Justin Mend recommended transfer to [facility].  40 Lasix x1 given, NovoLog 5 units, D50, albuterol and bicarb bolus, Lokelma 10mg  given. -Admit to stepdown - bicarb drip, d/c -  insert Foley -Held off on IV  fluids despite hypotension, as patient is anuric with EF of 20 to 25%. - BMP q4h X 4 -Follow-up nephrology recommendations -Repeat NovoLog 5 units, D50,  Albuterol neb, Ca gluconate -Avoid sedating medications antipsychotics at this time - Renal Ultrasound - hepatitis panel for HD. - NPO  Hyperkalemia with Non anion metabolic acidosis-likely 2/2 uremia/azotemia. BMP-bicarb 11 venous pH- 7.1.  Normal anion gap 5.  Normal lactic acid. -D/c Bicarb drip, as patient is anuric, avoid overload. -Follow-up BMP  Systolic and diastolic CHF, atrial fibrillation, AICD status- last echo 03/2017, EF 20 to 25%, G2DD. INR- 1.6.  BNP- 605, prior elevations to 1200. CHF appears stable. Follows with Dr. Lovena Le. -Hold Coreg, Lasix, metolazone, warfarin, lisinopril.  DM-no recent A1c on file -Hold home metformin Lantus 20 units twice daily - SSI - HGba1c  COPD- Stable.  -Continue home inhalers  Colon Cancer hx  2006 status post subtotal colectomy.  HIV as part of routine health screening   DVT prophylaxis:Scds Code Status: Full Family Communication: None at bedside Disposition Plan: > 2 days Consults called: Nephrology Consult Admission status: Inpt, Stepdown   Bethena Roys MD [facility] Pager [phone] From 3PM- 11PM.  Otherwise please contact night-coverage [email or web address] Password [email or web address]  09/04/2018, 9:10 PM

## 2018-09-04 NOTE — ED Provider Notes (Signed)
Emergency Department Provider Note   I have reviewed the triage vital signs and the nursing notes.   HISTORY  Chief Complaint Weakness   HPI [name] is a 62 y.o. male with multiple medical problems as documented below the presents the emergency department today with generalized fatigue.  Patient states that he is felt very weak for approximately 3 to 4 weeks.  He states is been treated for diarrhea by Dr. Oneida Alar with some type of antibiotic but is unsure what or why.  He states that he also has heart failure and a pacemaker but has not had any problems with those.  He states over the last 3 to 4 days he had increase in his falls secondary to generalized weakness and also increased fatigue we just does not get up off the couch because he is too sleepy and afraid of falling.  He is noticed that he has had shaking in his right hand is progressively worsened over the last few days.  He is never anything like that before.  He still has an appetite without any weight loss.  He will have intermittently have some slurred speech but nothing persistent.  No discoloration of the skin.  No dark stools and no more diarrhea.  No nausea or vomiting.  No chest pain, back pain or shortness of breath.  On review of his records it appears that his most recent stool sent was negative for Giardia.  It also appears that he has an increasing creatinine at least over the last year.  A rib x-ray done a few days ago was negative for rib fractures or other chest symptoms. No other associated or modifying symptoms.    Past Medical History:  Diagnosis Date  . A-fib ([place])   . Anemia, normocytic normochromic 2008   2008  . Arteriosclerotic cardiovascular disease (ASCVD)    Acute MI in 2006->  LAD stent, EF-37%  . Arthritis    Hx: of in hands  . Automatic implantable cardioverter-defibrillator in situ   . Carpal tunnel syndrome   . Chronic systolic (congestive) heart failure (HCC)    a. EF 20-25% by echo in  03/2017  . Colon cancer ([place]) 2006   Partial colectomy in 2006  . Complete heart block ([place])    S/P ICD 02/09/13  . COPD (chronic obstructive pulmonary disease) ([place])   . Coronary artery disease   . Diabetes mellitus, type II ([place])    With peripheral neuropathy  . Edema    venous insufficiency  . H/O alcohol dependence ([place])   . H/O hiatal hernia   . Hyperlipidemia   . Hypertension   . Myocardial infarction [facility])    Hx: of 2006  . Neuropathy in diabetes ([place])    Hx: of  . Pacemaker   . Pneumonia 01/2012   01/2012; subsequent admission for chest pain  . Shortness of breath    Hx: of   . Tobacco abuse     Patient Active Problem List   Diagnosis Date Noted  . ARF (acute renal failure) ([place]) 09/04/2018  . Diarrhea 08/10/2018  . Abdominal bloating 07/22/2017  . IDA (iron deficiency anemia) 07/22/2017  . Barrett's esophagus 07/22/2017  . Heme positive stool   . Acute on chronic combined systolic and diastolic CHF (congestive heart failure) ([place]) 03/25/2017  . History of colon cancer 12/25/2014  . Abdominal pain 12/25/2014  . Encounter for therapeutic drug monitoring 02/11/2014  . Atrial fibrillation ([place]) 02/04/2014  . Dizziness 01/26/2014  .  Chronic systolic CHF (congestive heart failure) ([place]) 01/26/2014  . Ataxia 01/26/2014  . Biventricular implantable cardioverter-defibrillator in situ 03/16/2013  . COPD (chronic obstructive pulmonary disease) ([place])   . Arteriosclerotic cardiovascular disease (ASCVD)   . Diabetes mellitus, type II ([place])   . Colon cancer ([place])   . Hypertension   . Hyperlipidemia   . Tobacco abuse   . Anemia, normocytic normochromic   . Chronic diarrhea 07/10/2012    Past Surgical History:  Procedure Laterality Date  . ANKLE SURGERY     Right  . Biventricular ICD Placement  02/09/2013   Dr. Lovena Le  . COLONOSCOPY  08/17/2005   Pancolonic diverticula/Multiple rectal polyps removed as described above. The larger of the two likely responsible for  intermittent hematochezia/ Multiple polyps at the hepatic flexure resected with a snare./ Large polypoid lesion growing out of the base of the cecum, just behind the  ileocecal valve. This was not felt to be amendable to endoscopic resection. Biopsied multiple times  . CORONARY ANGIOPLASTY WITH STENT PLACEMENT    . ESOPHAGOGASTRODUODENOSCOPY N/A 03/29/2017   Barrett's esophagus, gastritis, duodenitis, focal glandular atypia on biopsy. Needs surveillance June 2019   . FLEXIBLE SIGMOIDOSCOPY    06/13/2006   Essentially normal residual rectum status post total colectomy. anastomosis at 25cm.  Focal area of abnormality adjacent to suture, as described above, likely not significant, suspect granulation tissue, biopsied.  Anastomosis otherwise appeared normal.  Distal terminal ileum appeared normal  . FLEXIBLE SIGMOIDOSCOPY N/A 01/13/2015   Dr. Gala Romney: normal-appearing residual rectum, surgical anastomosis, s/p subtotal colectomy. Surveillance 5 years   . FLEXIBLE SIGMOIDOSCOPY N/A 03/29/2017   Dr. Oneida Alar while inpatient: intermittent rectal bleeding due to ischemic erosion at anastomosis,s/p APC therapy. one 3 mm polyp in recum (hyperplastic). surveillance in 3 years  . INGUINAL HERNIA REPAIR    . MULTIPLE EXTRACTIONS WITH ALVEOLOPLASTY N/A 04/09/2013   Procedure: Extraction of tooth #'s 1,2,4,5,6,7,8,9,10,11,12,13,17,18,20,21,22,23,27,28, 29,30,31, and 32 with alveoloplasty.;  Surgeon: Lenn Cal, DDS;  Location: [facility];  Service: Oral Surgery;  Laterality: N/A;  . PERMANENT PACEMAKER INSERTION N/A 02/09/2013   Procedure: PERMANENT PACEMAKER INSERTION;  Surgeon: Evans Lance, MD;  Location: [facility] CATH LAB;  Service: Cardiovascular;  Laterality: N/A;  . SUBTOTAL COLECTOMY  09/01/2005   subtotal colectomy  . TEMPORARY PACEMAKER INSERTION Right 02/09/2013   Procedure: TEMPORARY PACEMAKER INSERTION;  Surgeon: Sinclair Grooms, MD;  Location: [facility] CATH LAB;  Service: Cardiovascular;  Laterality: Right;     Current Outpatient Rx  . Order #: [id number] Class: Historical Med  . Order #: [id number] Class: Historical Med  . Order #: [id number] Class: Historical Med  . Order #: [id number] Class: Historical Med  . Order #: [id number] Class: Historical Med  . Order #: [id number] Class: Normal  . Order #: [id number] Class: Historical Med  . Order #: [id number] Class: Historical Med  . Order #: [id number] Class: Normal  . Order #: [id number] Class: Historical Med  . Order #: [id number] Class: Historical Med  . Order #: [id number] Class: Historical Med  . Order #: [id number] Class: Normal  . Order #: [id number] Class: Historical Med  . Order #: [id number] Class: Normal  . Order #: [id number] Class: Historical Med  . Order #: [id number] Class: Normal  . Order #: [id number] Class: Historical Med  . Order #: [id number] Class: Historical Med  . Order #: [id number] Class: Historical Med  . Order #: [id number] Class: Historical Med  . Order #: [id number] Class: Historical Med  . Order #: [id number] Class: Normal  . Order #: [id number] Class: Historical Med    Allergies Vancomycin  Family History  Problem Relation Age of Onset  . Colon cancer Mother        Chetan Mehring, ?>age 44.  [name] Heart disease Mother   . Heart disease Father   . Hypertension Brother   . Heart disease Brother   . Diabetes Brother   . Liver disease Neg Hx     Social History Social History   Tobacco Use  . Smoking status: Current Every Day Smoker    Packs/day: 1.00    Years: 40.00    Pack years: 40.00    Types: Cigarettes    Start date: 07/13/1971  . Smokeless tobacco: Never Used  Substance Use Topics  . Alcohol use: No    Alcohol/week: 0.0 standard drinks  . Drug use: No    Review of Systems  All other systems negative except as documented in the HPI. All pertinent positives and negatives as reviewed in the HPI. ____________________________________________   PHYSICAL EXAM:  VITAL SIGNS: ED Triage Vitals  Enc Vitals Group     BP 09/04/18 1505 (!)  116/42     Pulse Rate 09/04/18 1505 61     Resp 09/04/18 1505 20     Temp --      Temp src --      SpO2 09/04/18 1505 96 %     Weight 09/04/18 1509 175 lb (79.4 kg)     Height 09/04/18 1509 6' (1.829 m)    Constitutional: Alert and oriented. Well appearing and in no acute distress. Eyes: Conjunctivae are normal. PERRL. EOMI. No pallor or icterus. Head: Atraumatic. Nose: No congestion/rhinnorhea. Mouth/Throat: Mucous membranes are dry.  Oropharynx non-erythematous. Neck: No stridor.  No meningeal signs.   Cardiovascular: Normal rate, regular rhythm. Has bronzing of RLE. Grossly normal heart sounds.   Respiratory: Normal respiratory effort.  No retractions. Lungs CTAB. Gastrointestinal: Soft and nontender. No distention.  Musculoskeletal: No lower extremity tenderness nor edema. No gross deformities of extremities. Neurologic:  Normal speech and language.  At rest his right hand has intermittent twitching behavior.  There is some tremor to it as well but every few seconds it will jerk a couple times.  Left hand is normal.  He has symmetric strength in both upper and lower extremities.  He has symmetric reflexes in his bicep and patellar tendons.  He has normal sensation multiple places on his face.  Raise eyebrows normally.  Able to close his mouth and blow out both of his cheeks.  Able to stick out his tongue to move it side to side. With arms outstretched and wrists flexed appears to have asterixis bilaterally. Skin:  Skin is warm, dry and intact. No rash noted.  Abrasions to multiple parts of his left hand and a healing laceration to his left lateral shoulder.   ____________________________________________   LABS (all labs ordered are listed, but only abnormal results are displayed)  Labs Reviewed  CBC WITH DIFFERENTIAL/PLATELET - Abnormal; Notable for the following components:      Result Value   RBC 3.43 (*)    Hemoglobin 10.1 (*)    HCT 32.5 (*)    RDW 15.6 (*)    All other  components within normal limits  COMPREHENSIVE METABOLIC PANEL - Abnormal; Notable for the following components:   Sodium 130 (*)    Potassium >7.5 (*)    Chloride 114 (*)    CO2 11 (*)    Glucose, Bld 122 (*)    BUN 83 (*)    Creatinine, Ser 4.77 (*)  Calcium 7.8 (*)    Total Protein 6.3 (*)    Albumin 3.1 (*)    GFR calc non Af Amer 12 (*)    GFR calc Af Amer 14 (*)    All other components within normal limits  LIPASE, BLOOD - Abnormal; Notable for the following components:   Lipase 105 (*)    All other components within normal limits  BLOOD GAS, VENOUS - Abnormal; Notable for the following components:   pH, Ven 7.102 (*)    pCO2, Ven 35.2 (*)    pO2, Ven 64.1 (*)    Bicarbonate 11.0 (*)    Acid-base deficit 17.2 (*)    All other components within normal limits  URINALYSIS, ROUTINE W REFLEX MICROSCOPIC - Abnormal; Notable for the following components:   APPearance HAZY (*)    Protein, ur 30 (*)    Leukocytes, UA TRACE (*)    Bacteria, UA RARE (*)    All other components within normal limits  BRAIN NATRIURETIC PEPTIDE - Abnormal; Notable for the following components:   B Natriuretic Peptide 605.0 (*)    All other components within normal limits  PROTIME-INR - Abnormal; Notable for the following components:   Prothrombin Time 18.9 (*)    All other components within normal limits  POTASSIUM - Abnormal; Notable for the following components:   Potassium >7.5 (*)    All other components within normal limits  BASIC METABOLIC PANEL - Abnormal; Notable for the following components:   Sodium 131 (*)    Potassium 6.7 (*)    CO2 12 (*)    Glucose, Bld 145 (*)    BUN 85 (*)    Creatinine, Ser 4.60 (*)    Calcium 8.1 (*)    GFR calc non Af Amer 13 (*)    GFR calc Af Amer 15 (*)    All other components within normal limits  CBG MONITORING, ED - Abnormal; Notable for the following components:   Glucose-Capillary 108 (*)    All other components within normal limits  CBG  MONITORING, ED - Abnormal; Notable for the following components:   Glucose-Capillary 121 (*)    All other components within normal limits  CBG MONITORING, ED - Abnormal; Notable for the following components:   Glucose-Capillary 206 (*)    All other components within normal limits  AMMONIA  TROPONIN I  TSH  LACTIC ACID, PLASMA  LACTIC ACID, PLASMA  BASIC METABOLIC PANEL  BASIC METABOLIC PANEL  BASIC METABOLIC PANEL  HEMOGLOBIN A1C  I-STAT TROPONIN, ED   ____________________________________________  EKG   EKG Interpretation  Date/Time:  Monday September 04 2018 15:05:35 EST Ventricular Rate:  61 PR Interval:    QRS Duration: 172 QT Interval:  443 QTC Calculation: 447 R Axis:   -70 Text Interpretation:  Atrial-ventricular dual-paced complexes No further analysis attempted due to paced rhythm Baseline wander in lead(s) V1 Confirmed by Merrily Pew [id number]) on 09/04/2018 3:51:57 PM       ____________________________________________  RADIOLOGY  Dg Chest 2 View  Result Date: 09/04/2018 CLINICAL DATA:  Weakness. EXAM: CHEST - 2 VIEW COMPARISON:  09/01/2018 FINDINGS: Left chest wall ICD is noted with leads in the right atrial appendage, and right ventricle. Mild cardiac enlargement. There is asymmetric elevation of the right hemidiaphragm. No airspace opacities identified. IMPRESSION: 1. No acute cardiopulmonary abnormalities. Electronically Signed   By: Kerby Moors M.D.   On: 09/04/2018 16:31   Ct Head Wo Contrast  Result Date: 09/04/2018 CLINICAL DATA:  62 y/o M; generalized weakness and involuntary shaking of the right arm. Patient is fallen several times over the past week. EXAM: CT HEAD WITHOUT CONTRAST TECHNIQUE: Contiguous axial images were obtained from the base of the skull through the vertex without intravenous contrast. COMPARISON:  01/27/2014 CT head FINDINGS: Brain: No evidence of acute infarction, hemorrhage, hydrocephalus, extra-axial collection or mass lesion/mass  effect. Few stable nonspecific white matter hypodensities compatible with mild chronic microvascular ischemic changes. Stable mild volume loss of the brain. Vascular: Calcific atherosclerosis of carotid siphons and vertebral arteries. Skull: Normal. Negative for fracture or focal lesion. Sinuses/Orbits: No acute finding. Other: None. IMPRESSION: 1. No acute intracranial abnormality identified. 2. Stable mild chronic microvascular ischemic changes and mild volume loss of the brain. Electronically Signed   By: Kristine Garbe M.D.   On: 09/04/2018 16:45    ____________________________________________   PROCEDURES  Procedure(s) performed:   Procedures  CRITICAL CARE Performed by: Merrily Pew Total critical care time: 35 minutes Critical care time was exclusive of separately billable procedures and treating other patients. Critical care was necessary to treat or prevent imminent or life-threatening deterioration. Critical care was time spent personally by me on the following activities: development of treatment plan with patient and/or surrogate as well as nursing, discussions with consultants, evaluation of patient's response to treatment, examination of patient, obtaining history from patient or surrogate, ordering and performing treatments and interventions, ordering and review of laboratory studies, ordering and review of radiographic studies, pulse oximetry and re-evaluation of patient's condition. ____________________________________________   INITIAL IMPRESSION / ASSESSMENT AND PLAN / ED COURSE  Unclear etiology on initial evaluation for the cause of his symptoms.  Suspect some metabolic issue such as hyperammonemia or uremia from worsening renal failure.  Also could be like slight abnormality however it seems that the diarrhea is resolved and he is been eating drinking normally so seems less likely.  Also consider possible cancer with the cough, weakness and fatigue and now these  new neurologic symptoms could have metastasized to his brain so we will get a head CT, chest x-ray and a metabolic work-up.  No focal deficit other than the shaking in his right hand but does not really consistent with an acute stroke and is been there for 4 days anyway.  Work-up reveals the patient likely has acute renal failure with significant severe hyperkalemia. Rechecked and still high. Instituted bicarb/albuterol/fluids/lasix/Lokelma/insulin and d50. Discussed with nephrology who recommends [facility] to [facility] so that they can see him.   Discussed with hospitalist who will see and admit to [facility].   Pertinent labs & imaging results that were available during my care of the patient were reviewed by me and considered in my medical decision making (see chart for details).  ____________________________________________  FINAL CLINICAL IMPRESSION(S) / ED DIAGNOSES  Final diagnoses:  Anuria  Hyperkalemia  Acute renal failure, unspecified acute renal failure type ([place])     MEDICATIONS GIVEN DURING THIS VISIT:  Medications  sodium bicarbonate 150 mEq in dextrose 5 % 1,000 mL infusion ( Intravenous Rate/Dose Change 09/04/18 2138)  albuterol (PROVENTIL) (2.5 MG/3ML) 0.083% nebulizer solution 10 mg (10 mg Nebulization Given 09/04/18 1736)  insulin aspart (novoLOG) injection 5 Units (5 Units Intravenous Given 09/04/18 1706)    And  dextrose 50 % solution 50 mL (50 mLs Intravenous Given 09/04/18 1703)  dextrose 10 % infusion ( Intravenous Rate/Dose Change 09/04/18 1909)  sodium bicarbonate injection 50 mEq (50 mEq Intravenous Given 09/04/18 1707)  calcium gluconate 1 g/ 50  mL sodium chloride IVPB (0 g Intravenous Stopped 09/04/18 1804)  sodium zirconium cyclosilicate (LOKELMA) packet 10 g (10 g Oral Given 09/04/18 1801)  calcium gluconate 1 g/ 50 mL sodium chloride IVPB (0 g Intravenous Stopped 09/04/18 1940)  lactated ringers bolus 1,000 mL (0 mLs Intravenous Stopped 09/04/18 2001)  furosemide (LASIX)  injection 40 mg (40 mg Intravenous Given 09/04/18 1831)  calcium gluconate 1 g/ 50 mL sodium chloride IVPB (0 g Intravenous Stopped 09/04/18 2120)  dextrose 50 % solution 50 mL (50 mLs Intravenous Given 09/04/18 2109)  insulin aspart (novoLOG) injection 5 Units (5 Units Subcutaneous Given 09/04/18 2009)  albuterol (PROVENTIL) (2.5 MG/3ML) 0.083% nebulizer solution 10 mg (10 mg Nebulization Given 09/04/18 2017)     NEW OUTPATIENT MEDICATIONS STARTED DURING THIS VISIT:  New Prescriptions   No medications on file    Note:  This note was prepared with assistance of Dragon voice recognition software. Occasional wrong-word or sound-a-like substitutions may have occurred due to the inherent limitations of voice recognition software.   Merrily Pew, MD 09/04/18 2209

## 2018-09-04 NOTE — ED Notes (Addendum)
Date and time results received: 09/04/18 1800 (use smartphrase ".now" to insert current time)  Test:Potassium Critical Value:Greater 7.5 Name of Provider Notified: Dr Dayna Barker  Orders Received? Or Actions Taken?:NA

## 2018-09-05 ENCOUNTER — Inpatient Hospital Stay (HOSPITAL_COMMUNITY): Payer: Medicare HMO

## 2018-09-05 ENCOUNTER — Other Ambulatory Visit: Payer: Self-pay

## 2018-09-05 DIAGNOSIS — I4891 Unspecified atrial fibrillation: Secondary | ICD-10-CM

## 2018-09-05 LAB — CBC
HCT: 27.9 % — ABNORMAL LOW (ref 39.0–52.0)
Hemoglobin: 8.6 g/dL — ABNORMAL LOW (ref 13.0–17.0)
MCH: 28.7 pg (ref 26.0–34.0)
MCHC: 30.8 g/dL (ref 30.0–36.0)
MCV: 93 fL (ref 80.0–100.0)
Platelets: 169 10*3/uL (ref 150–400)
RBC: 3 MIL/uL — ABNORMAL LOW (ref 4.22–5.81)
RDW: 15.1 % (ref 11.5–15.5)
WBC: 5.8 10*3/uL (ref 4.0–10.5)
nRBC: 0 % (ref 0.0–0.2)

## 2018-09-05 LAB — RENAL FUNCTION PANEL
Albumin: 2.4 g/dL — ABNORMAL LOW (ref 3.5–5.0)
Albumin: 2.3 g/dL — ABNORMAL LOW (ref 3.5–5.0)
Anion gap: 6 (ref 5–15)
Anion gap: 6 (ref 5–15)
BUN: 80 mg/dL — ABNORMAL HIGH (ref 8–23)
BUN: 79 mg/dL — ABNORMAL HIGH (ref 8–23)
CHLORIDE: 111 mmol/L (ref 98–111)
CO2: 14 mmol/L — ABNORMAL LOW (ref 22–32)
CO2: 17 mmol/L — ABNORMAL LOW (ref 22–32)
Calcium: 7.9 mg/dL — ABNORMAL LOW (ref 8.9–10.3)
Calcium: 7.9 mg/dL — ABNORMAL LOW (ref 8.9–10.3)
Chloride: 111 mmol/L (ref 98–111)
Creatinine, Ser: 4.01 mg/dL — ABNORMAL HIGH (ref 0.61–1.24)
Creatinine, Ser: 3.82 mg/dL — ABNORMAL HIGH (ref 0.61–1.24)
GFR calc Af Amer: 17 mL/min — ABNORMAL LOW (ref >60)
GFR calc Af Amer: 18 mL/min — ABNORMAL LOW (ref >60)
GFR calc non Af Amer: 15 mL/min — ABNORMAL LOW (ref >60)
GFR calc non Af Amer: 16 mL/min — ABNORMAL LOW (ref >60)
GLUCOSE: 128 mg/dL — AB (ref 70–99)
Glucose, Bld: 155 mg/dL — ABNORMAL HIGH (ref 70–99)
Phosphorus: 7.8 mg/dL — ABNORMAL HIGH (ref 2.5–4.6)
Phosphorus: 7.3 mg/dL — ABNORMAL HIGH (ref 2.5–4.6)
Potassium: 6.9 mmol/L (ref 3.5–5.1)
Potassium: 6.4 mmol/L (ref 3.5–5.1)
Sodium: 131 mmol/L — ABNORMAL LOW (ref 135–145)
Sodium: 134 mmol/L — ABNORMAL LOW (ref 135–145)

## 2018-09-05 LAB — GLUCOSE, CAPILLARY
GLUCOSE-CAPILLARY: 114 mg/dL — AB (ref 70–99)
Glucose-Capillary: 157 mg/dL — ABNORMAL HIGH (ref 70–99)
Glucose-Capillary: 170 mg/dL — ABNORMAL HIGH (ref 70–99)
Glucose-Capillary: 111 mg/dL — ABNORMAL HIGH (ref 70–99)
Glucose-Capillary: 183 mg/dL — ABNORMAL HIGH (ref 70–99)
Glucose-Capillary: 107 mg/dL — ABNORMAL HIGH (ref 70–99)

## 2018-09-05 LAB — MRSA PCR SCREENING: MRSA by PCR: NEGATIVE

## 2018-09-05 LAB — HEMOGLOBIN A1C
Hgb A1c MFr Bld: 7.3 % — ABNORMAL HIGH (ref 4.8–5.6)
Mean Plasma Glucose: 162.81 mg/dL

## 2018-09-05 LAB — HIV ANTIBODY (ROUTINE TESTING W REFLEX): HIV Screen 4th Generation wRfx: NONREACTIVE

## 2018-09-05 MED ORDER — ACETAMINOPHEN 325 MG PO TABS
650.0000 mg | ORAL_TABLET | Freq: Four times a day (QID) | ORAL | Status: DC | PRN
  Administered 2018-09-05 – 2018-09-06 (×3): 650 mg via ORAL
  Filled 2018-09-05 (×3): qty 2

## 2018-09-05 MED ORDER — ONDANSETRON HCL 4 MG PO TABS
4.0000 mg | ORAL_TABLET | Freq: Four times a day (QID) | ORAL | Status: DC | PRN
  Administered 2018-09-06: 4 mg via ORAL
  Filled 2018-09-05: qty 1

## 2018-09-05 MED ORDER — ALBUTEROL SULFATE (2.5 MG/3ML) 0.083% IN NEBU: 2.5000 mg | INHALATION_SOLUTION | Freq: Four times a day (QID) | RESPIRATORY_TRACT | Status: DC | PRN

## 2018-09-05 MED ORDER — SODIUM ZIRCONIUM CYCLOSILICATE 10 G PO PACK
10.0000 g | PACK | Freq: Every day | ORAL | Status: DC
  Administered 2018-09-05 – 2018-09-07 (×3): 10 g via ORAL
  Filled 2018-09-05 (×4): qty 1

## 2018-09-05 MED ORDER — INSULIN ASPART 100 UNIT/ML ~~LOC~~ SOLN
0.0000 [IU] | SUBCUTANEOUS | Status: DC
  Administered 2018-09-05: 3 [IU] via SUBCUTANEOUS
  Administered 2018-09-05 – 2018-09-06 (×2): 2 [IU] via SUBCUTANEOUS
  Administered 2018-09-06 (×2): 1 [IU] via SUBCUTANEOUS
  Administered 2018-09-06: 2 [IU] via SUBCUTANEOUS
  Administered 2018-09-07: 1 [IU] via SUBCUTANEOUS
  Administered 2018-09-07: 3 [IU] via SUBCUTANEOUS
  Administered 2018-09-07: 2 [IU] via SUBCUTANEOUS

## 2018-09-05 MED ORDER — SODIUM BICARBONATE 8.4 % IV SOLN
INTRAVENOUS | Status: AC
  Administered 2018-09-05 – 2018-09-06 (×3): via INTRAVENOUS
  Filled 2018-09-05 (×4): qty 150

## 2018-09-05 MED ORDER — POLYETHYLENE GLYCOL 3350 17 G PO PACK: 17.0000 g | PACK | Freq: Every day | ORAL | Status: DC | PRN

## 2018-09-05 MED ORDER — CALCIUM GLUCONATE-NACL 1-0.675 GM/50ML-% IV SOLN
1.0000 g | Freq: Once | INTRAVENOUS | Status: AC
  Administered 2018-09-05: 1000 mg via INTRAVENOUS
  Filled 2018-09-05: qty 50

## 2018-09-05 MED ORDER — ONDANSETRON HCL 4 MG/2ML IJ SOLN: 4.0000 mg | Freq: Four times a day (QID) | INTRAMUSCULAR | Status: DC | PRN

## 2018-09-05 MED ORDER — SODIUM ZIRCONIUM CYCLOSILICATE 10 G PO PACK
10.0000 g | PACK | Freq: Every day | ORAL | Status: AC
  Administered 2018-09-05: 10 g via ORAL
  Filled 2018-09-05: qty 1

## 2018-09-05 NOTE — Progress Notes (Signed)
Pt arrived from [facility] via [facility].  [facility] paged to inform of patient arrival.

## 2018-09-05 NOTE — Consult Note (Signed)
[facility]  HISTORY AND PHYSICAL  [name] is an 62 y.o. male.    Chief Complaint: weakness and shakiness  HPI: Pt is a 36M with a PMH significant for HTN, HLD, CHF with EF 20-25%, h/o complete heart block s/p PPM 2014, DM, Afib on warfarin who is now seen in consultation at the request of Dr. Nevada Crane for evaluation and recommendations surrounding AKI and hyperkalemia.    Pt was diagnosed with C diff diarrhea 08/2018 and was given Flagyl and a probiotic.  Despite copious diarrhea (which has now gotten better), he continued to take his Lasix, lisinopril, and other medications.  Baseline creatinine appears to be between 1.3-1.5.  Was 2.39 on 08/11/2018.    He presented to [facility] with weakness and shakiness.  Was found to be hypotensive, tachycardic, and an AKI with BUN 83 and Cr 4.77.  K was > 7.5.  He was given calcium, bicarb, insulin/ dextrose, albuterol, lasix, and Lokelma x 1.  1000 mL LR was also given.    [facility] workup and care.    On my evaluation today he says he's thirsty.  He has a D10 gtt going at 50 mL/ hr.  A bicarb gtt was briefly started and then stopped last night.   PMH: Past Medical History:  Diagnosis Date  . A-fib ([place])   . Anemia, normocytic normochromic 2008   2008  . Arteriosclerotic cardiovascular disease (ASCVD)    Acute MI in 2006->  LAD stent, EF-37%  . Arthritis    Hx: of in hands  . Automatic implantable cardioverter-defibrillator in situ   . Carpal tunnel syndrome   . Chronic systolic (congestive) heart failure (HCC)    a. EF 20-25% by echo in 03/2017  . Colon cancer ([place]) 2006   Partial colectomy in 2006  . Complete heart block ([place])    S/P ICD 02/09/13  . COPD (chronic obstructive pulmonary disease) ([place])   . Coronary artery disease   . Diabetes mellitus, type II ([place])    With peripheral neuropathy  . Edema    venous insufficiency  . H/O alcohol dependence ([place])   . H/O hiatal hernia   .  Hyperlipidemia   . Hypertension   . Myocardial infarction [facility])    Hx: of 2006  . Neuropathy in diabetes ([place])    Hx: of  . Pacemaker   . Pneumonia 01/2012   01/2012; subsequent admission for chest pain  . Shortness of breath    Hx: of   . Tobacco abuse    PSH: Past Surgical History:  Procedure Laterality Date  . ANKLE SURGERY     Right  . Biventricular ICD Placement  02/09/2013   Dr. Lovena Le  . COLONOSCOPY  08/17/2005   Pancolonic diverticula/Multiple rectal polyps removed as described above. The larger of the two likely responsible for intermittent hematochezia/ Multiple polyps at the hepatic flexure resected with a snare./ Large polypoid lesion growing out of the base of the cecum, just behind the  ileocecal valve. This was not felt to be amendable to endoscopic resection. Biopsied multiple times  . CORONARY ANGIOPLASTY WITH STENT PLACEMENT    . ESOPHAGOGASTRODUODENOSCOPY N/A 03/29/2017   Barrett's esophagus, gastritis, duodenitis, focal glandular atypia on biopsy. Needs surveillance June 2019   . FLEXIBLE SIGMOIDOSCOPY    06/13/2006   Essentially normal residual rectum status post total colectomy. anastomosis at 25cm.  Focal area of abnormality adjacent to suture, as described above, likely not significant,  suspect granulation tissue, biopsied.  Anastomosis otherwise appeared normal.  Distal terminal ileum appeared normal  . FLEXIBLE SIGMOIDOSCOPY N/A 01/13/2015   Dr. Gala Romney: normal-appearing residual rectum, surgical anastomosis, s/p subtotal colectomy. Surveillance 5 years   . FLEXIBLE SIGMOIDOSCOPY N/A 03/29/2017   Dr. Oneida Alar while inpatient: intermittent rectal bleeding due to ischemic erosion at anastomosis,s/p APC therapy. one 3 mm polyp in recum (hyperplastic). surveillance in 3 years  . INGUINAL HERNIA REPAIR    . MULTIPLE EXTRACTIONS WITH ALVEOLOPLASTY N/A 04/09/2013   Procedure: Extraction of tooth #'s 1,2,4,5,6,7,8,9,10,11,12,13,17,18,20,21,22,23,27,28, 29,30,31, and 32 with  alveoloplasty.;  Surgeon: Lenn Cal, DDS;  Location: [facility];  Service: Oral Surgery;  Laterality: N/A;  . PERMANENT PACEMAKER INSERTION N/A 02/09/2013   Procedure: PERMANENT PACEMAKER INSERTION;  Surgeon: Evans Lance, MD;  Location: [facility] CATH LAB;  Service: Cardiovascular;  Laterality: N/A;  . SUBTOTAL COLECTOMY  09/01/2005   subtotal colectomy  . TEMPORARY PACEMAKER INSERTION Right 02/09/2013   Procedure: TEMPORARY PACEMAKER INSERTION;  Surgeon: Sinclair Grooms, MD;  Location: [facility] CATH LAB;  Service: Cardiovascular;  Laterality: Right;    Past Medical History:  Diagnosis Date  . A-fib ([place])   . Anemia, normocytic normochromic 2008   2008  . Arteriosclerotic cardiovascular disease (ASCVD)    Acute MI in 2006->  LAD stent, EF-37%  . Arthritis    Hx: of in hands  . Automatic implantable cardioverter-defibrillator in situ   . Carpal tunnel syndrome   . Chronic systolic (congestive) heart failure (HCC)    a. EF 20-25% by echo in 03/2017  . Colon cancer ([place]) 2006   Partial colectomy in 2006  . Complete heart block ([place])    S/P ICD 02/09/13  . COPD (chronic obstructive pulmonary disease) ([place])   . Coronary artery disease   . Diabetes mellitus, type II ([place])    With peripheral neuropathy  . Edema    venous insufficiency  . H/O alcohol dependence ([place])   . H/O hiatal hernia   . Hyperlipidemia   . Hypertension   . Myocardial infarction [facility])    Hx: of 2006  . Neuropathy in diabetes ([place])    Hx: of  . Pacemaker   . Pneumonia 01/2012   01/2012; subsequent admission for chest pain  . Shortness of breath    Hx: of   . Tobacco abuse     Medications:   Scheduled: . insulin aspart  0-9 Units Subcutaneous Q4H  . sodium zirconium cyclosilicate  10 g Oral Once  . sodium zirconium cyclosilicate  10 g Oral Daily    Medications Prior to Admission  Medication Sig Dispense Refill  . acetaminophen (TYLENOL) 500 MG tablet Take 1 tablet by mouth as needed.    [name] albuterol (PROVENTIL  HFA;VENTOLIN HFA) 108 (90 BASE) MCG/ACT inhaler Inhale 2 puffs into the lungs every 6 (six) hours as needed. For shortness of breath    . albuterol (PROVENTIL) (2.5 MG/3ML) 0.083% nebulizer solution Take 2.5 mg by nebulization every 6 (six) hours as needed. For shortness of breath    . carvedilol (COREG) 6.25 MG tablet Take 6.25 mg by mouth 2 (two) times daily with a meal.     . ferrous sulfate 325 (65 FE) MG EC tablet Take 325 mg by mouth 2 (two) times daily.    . furosemide (LASIX) 40 MG tablet TAKE 1 TABLET IN THE MORNING  AND TAKE 1/2 TABLET IN THE EVENING (Patient taking differently: Take 40 mg by mouth 2 (two) times daily. )  135 tablet 3  . gabapentin (NEURONTIN) 300 MG capsule Take 300 mg by mouth at bedtime.    [name] HYDROcodone-acetaminophen (NORCO/VICODIN) 5-325 MG tablet Take 1 tablet by mouth every 6 (six) hours as needed.    . hydrocortisone (ANUSOL-HC) 2.5 % rectal cream Place 1 application rectally 2 (two) times daily. (Patient taking differently: Place 1 application rectally 2 (two) times daily as needed. ) 30 g 1  . lisinopril (PRINIVIL,ZESTRIL) 10 MG tablet Take 1 tablet by mouth daily.    . magnesium oxide (MAG-OX) 400 MG tablet Take 400 mg by mouth 2 (two) times daily.    . metFORMIN (GLUCOPHAGE) 500 MG tablet Take 1,000 mg by mouth 2 (two) times daily with a meal.    . metolazone (ZAROXOLYN) 2.5 MG tablet TAKE 1 TABLET (2.5 MG) ON SUNDAY AND WEDNESDAY ONLY (Patient taking differently: Take 2.5 mg by mouth 2 (two) times a week. Sundays and Wednesdays only) 24 tablet 3  . Multiple Vitamin (MULTIVITAMIN) tablet Take 1 tablet by mouth daily.    . nitroGLYCERIN (NITROSTAT) 0.4 MG SL tablet Place 1 tablet (0.4 mg total) under the tongue every 5 (five) minutes x 3 doses as needed. For chest pain 25 tablet 3  . ondansetron (ZOFRAN) 4 MG tablet Take 4 mg by mouth 4 (four) times daily as needed for nausea or vomiting.    . pantoprazole (PROTONIX) 40 MG tablet Take 1 tablet (40 mg total) by  mouth daily before breakfast. 30 tablet 12  . primidone (MYSOLINE) 50 MG tablet Take 50 mg by mouth daily.     . Probiotic Product (RESTORA) CAPS Take 1 capsule by mouth daily.    . sertraline (ZOLOFT) 50 MG tablet Take 50 mg by mouth daily.    . simvastatin (ZOCOR) 80 MG tablet Take 40 mg by mouth at bedtime.    [name] tiZANidine (ZANAFLEX) 4 MG tablet Take 4 mg by mouth 3 (three) times daily.    [name] warfarin (COUMADIN) 4 MG tablet Take 3 tablets daily except 2 tablets on Sundays and Thursdays (Patient taking differently: Take 8-12 mg by mouth See admin instructions. Take 12mg   daily except 8mg  on Sundays and Thursdays) 270 tablet 4  . insulin glargine (LANTUS) 100 UNIT/ML injection Inject 20 Units into the skin 2 (two) times daily.       ALLERGIES:   Allergies  Allergen Reactions  . Vancomycin Itching and Rash    FAM HX: Family History  Problem Relation Age of Onset  . Colon cancer Mother        Montavis Schubring, ?>age 12.  [name] Heart disease Mother   . Heart disease Father   . Hypertension Brother   . Heart disease Brother   . Diabetes Brother   . Liver disease Neg Hx     Social History:   reports that he has been smoking cigarettes. He started smoking about 47 years ago. He has a 40.00 pack-year smoking history. He has never used smokeless tobacco. He reports that he does not drink alcohol or use drugs.  ROS: ROS: all other systems reviewed and are negative except as per HPI  Blood pressure (!) 122/44, pulse 70, temperature 100.1 F (37.8 C), temperature source Oral, resp. rate 12, height 6' (1.829 m), weight 77.1 kg, SpO2 92 %. PHYSICAL EXAM: Physical Exam  GEN older, ill-appearing gentleman, NAD, sleeping and easily arousable HEENT EOMI, PERRL NECK flat neck veins PULM clear bilaterally CV RRR no m/r/g ABD soft GU: + Foley in place,  at least 350 cc urine EXT + sarcopenia, no LE edema NEURO AAO x 3, occasional myoclonus    Results for orders placed or performed during the  hospital encounter of 09/04/18 (from the past 48 hour(s))  Blood gas, venous     Status: Abnormal   Collection Time: 09/04/18  3:15 PM  Result Value Ref Range   FIO2 0.21    pH, Ven 7.102 (LL) 7.250 - 7.430    Comment: RBV LESLIE CARDWELL RN AT [facility] VIA,RRT,RCP ON 09/04/2018    pCO2, Ven 35.2 (L) 44.0 - 60.0 mmHg   pO2, Ven 64.1 (H) 32.0 - 45.0 mmHg   Bicarbonate 11.0 (L) 20.0 - 28.0 mmol/L   Acid-base deficit 17.2 (H) 0.0 - 2.0 mmol/L   O2 Saturation 90.8 %   Patient temperature 37.0    Collection site VENOUS    Drawn by DRAWN BY RN    Sample type VENOUS     Comment: Performed at [facility], [address], [place], [place] [address]  Brain natriuretic peptide     Status: Abnormal   Collection Time: 09/04/18  3:17 PM  Result Value Ref Range   B Natriuretic Peptide 605.0 (H) 0.0 - 100.0 pg/mL    Comment: Performed at [facility], [address], [place], [place] [address]  CBC with Differential     Status: Abnormal   Collection Time: 09/04/18  3:24 PM  Result Value Ref Range   WBC 6.9 4.0 - 10.5 K/uL   RBC 3.43 (L) 4.22 - 5.81 MIL/uL   Hemoglobin 10.1 (L) 13.0 - 17.0 g/dL   HCT 32.5 (L) 39.0 - 52.0 %   MCV 94.8 80.0 - 100.0 fL   MCH 29.4 26.0 - 34.0 pg   MCHC 31.1 30.0 - 36.0 g/dL   RDW 15.6 (H) 11.5 - 15.5 %   Platelets 173 150 - 400 K/uL   nRBC 0.0 0.0 - 0.2 %   Neutrophils Relative % 70 %   Neutro Abs 4.9 1.7 - 7.7 K/uL   Lymphocytes Relative 16 %   Lymphs Abs 1.1 0.7 - 4.0 K/uL   Monocytes Relative 8 %   Monocytes Absolute 0.5 0.1 - 1.0 K/uL   Eosinophils Relative 5 %   Eosinophils Absolute 0.3 0.0 - 0.5 K/uL   Basophils Relative 1 %   Basophils Absolute 0.0 0.0 - 0.1 K/uL   Immature Granulocytes 0 %   Abs Immature Granulocytes 0.03 0.00 - 0.07 K/uL    Comment: Performed at [facility], [address], [place], [place] [address]  Comprehensive metabolic panel     Status: Abnormal   Collection Time: 09/04/18  3:24 PM  Result Value Ref Range   Sodium  130 (L) 135 - 145 mmol/L   Potassium >7.5 (HH) 3.5 - 5.1 mmol/L    Comment: CRITICAL RESULT CALLED TO, READ BACK BY AND VERIFIED WITH: CARDWELL,L ON 09/04/18 AT 1620 BY LOY,C    Chloride 114 (H) 98 - 111 mmol/L   CO2 11 (L) 22 - 32 mmol/L   Glucose, Bld 122 (H) 70 - 99 mg/dL   BUN 83 (H) 8 - 23 mg/dL   Creatinine, Ser 4.77 (H) 0.61 - 1.24 mg/dL   Calcium 7.8 (L) 8.9 - 10.3 mg/dL   Total Protein 6.3 (L) 6.5 - 8.1 g/dL   Albumin 3.1 (L) 3.5 - 5.0 g/dL   AST 16 15 - 41 U/L   ALT 17 0 - 44 U/L   Alkaline Phosphatase 90  38 - 126 U/L   Total Bilirubin 0.3 0.3 - 1.2 mg/dL   GFR calc non Af Amer 12 (L) >60 mL/min   GFR calc Af Amer 14 (L) >60 mL/min   Anion gap 5 5 - 15    Comment: Performed at [facility], [address] [place], [place] [address]  Lipase, blood     Status: Abnormal   Collection Time: 09/04/18  3:24 PM  Result Value Ref Range   Lipase 105 (H) 11 - 51 U/L    Comment: Performed at [facility], [address], [place], [place] [address]  Troponin I - ONCE - STAT     Status: None   Collection Time: 09/04/18  3:24 PM  Result Value Ref Range   Troponin I <0.03 <0.03 ng/mL    Comment: Performed at [facility], [address], [place], [place] [address]  Protime-INR     Status: Abnormal   Collection Time: 09/04/18  3:24 PM  Result Value Ref Range   Prothrombin Time 18.9 (H) 11.4 - 15.2 seconds   INR 1.61     Comment: Performed at [facility], [address] [place], [place] [address]  Hemoglobin A1c     Status: Abnormal   Collection Time: 09/04/18  3:24 PM  Result Value Ref Range   Hgb A1c MFr Bld 7.3 (H) 4.8 - 5.6 %    Comment: (NOTE) Pre diabetes:          5.7%-6.4% Diabetes:              >6.4% Glycemic control for   <7.0% adults with diabetes    Mean Plasma Glucose 162.81 mg/dL    Comment: Performed at [facility] [address], [place], [place] [address]  Ammonia     Status: None   Collection Time: 09/04/18  3:35 PM  Result Value Ref Range   Ammonia  18 9 - 35 umol/L    Comment: Performed at [facility], [address] [place], [place] [address]  I-stat troponin, ED     Status: None   Collection Time: 09/04/18  3:42 PM  Result Value Ref Range   Troponin i, poc 0.00 0.00 - 0.08 ng/mL   Comment 3            Comment: Due to the release kinetics of cTnI, a negative result within the first hours of the onset of symptoms does not rule out myocardial infarction with certainty. If myocardial infarction is still suspected, repeat the test at appropriate intervals.   TSH     Status: None   Collection Time: 09/04/18  3:48 PM  Result Value Ref Range   TSH 1.026 0.350 - 4.500 uIU/mL    Comment: Performed by a 3rd Generation assay with a functional sensitivity of <=0.01 uIU/mL. Performed at [facility], [address] [place], [place] [address]   Lactic acid, plasma     Status: None   Collection Time: 09/04/18  3:48 PM  Result Value Ref Range   Lactic Acid, Venous 0.5 0.5 - 1.9 mmol/L    Comment: Performed at [facility], [address], [place], [place] [address]  CBG monitoring, ED     Status: Abnormal   Collection Time: 09/04/18  4:17 PM  Result Value Ref Range   Glucose-Capillary 108 (H) 70 - 99 mg/dL  Potassium     Status: Abnormal   Collection Time: 09/04/18  4:53 PM  Result Value Ref Range   Potassium >7.5 (HH)  3.5 - 5.1 mmol/L    Comment: CRITICAL RESULT CALLED TO, READ BACK BY AND VERIFIED WITH: CRAWFORD,H ON 09/04/18 AT  1750 BY LOY,C Performed at [facility], [address], [place], [place] [address]   Lactic acid, plasma     Status: None   Collection Time: 09/04/18  6:10 PM  Result Value Ref Range   Lactic Acid, Venous 0.8 0.5 - 1.9 mmol/L    Comment: Performed at [facility], [address], [place], [place] [address]  CBG monitoring, ED     Status: Abnormal   Collection Time: 09/04/18  6:30 PM  Result Value Ref Range   Glucose-Capillary 121 (H) 70 - 99 mg/dL  Basic metabolic panel     Status: Abnormal    Collection Time: 09/04/18  8:19 PM  Result Value Ref Range   Sodium 131 (L) 135 - 145 mmol/L   Potassium 6.7 (HH) 3.5 - 5.1 mmol/L    Comment: CRITICAL RESULT CALLED TO, READ BACK BY AND VERIFIED WITH: BELTON,C ON 09/04/18 AT 2100 BY LOY,C    Chloride 111 98 - 111 mmol/L   CO2 12 (L) 22 - 32 mmol/L   Glucose, Bld 145 (H) 70 - 99 mg/dL   BUN 85 (H) 8 - 23 mg/dL   Creatinine, Ser 4.60 (H) 0.61 - 1.24 mg/dL   Calcium 8.1 (L) 8.9 - 10.3 mg/dL   GFR calc non Af Amer 13 (L) >60 mL/min   GFR calc Af Amer 15 (L) >60 mL/min   Anion gap 8 5 - 15    Comment: Performed at [facility], [address], [place], [place] [address]  Urinalysis, Routine w reflex microscopic     Status: Abnormal   Collection Time: 09/04/18  9:15 PM  Result Value Ref Range   Color, Urine YELLOW YELLOW   APPearance HAZY (A) CLEAR   Specific Gravity, Urine 1.015 1.005 - 1.030   pH 5.0 5.0 - 8.0   Glucose, UA NEGATIVE NEGATIVE mg/dL   Hgb urine dipstick NEGATIVE NEGATIVE   Bilirubin Urine NEGATIVE NEGATIVE   Ketones, ur NEGATIVE NEGATIVE mg/dL   Protein, ur 30 (A) NEGATIVE mg/dL   Nitrite NEGATIVE NEGATIVE   Leukocytes, UA TRACE (A) NEGATIVE   RBC / HPF 0-5 0 - 5 RBC/hpf   WBC, UA 0-5 0 - 5 WBC/hpf   Bacteria, UA RARE (A) NONE SEEN   Squamous Epithelial / LPF 0-5 0 - 5   Mucus PRESENT    Hyaline Casts, UA PRESENT     Comment: Performed at [facility], [address] [place], [place] [address]  CBG monitoring, ED     Status: Abnormal   Collection Time: 09/04/18  9:46 PM  Result Value Ref Range   Glucose-Capillary 206 (H) 70 - 99 mg/dL   Comment 1 Notify RN    Comment 2 Document in Chart   CBG monitoring, ED     Status: Abnormal   Collection Time: 09/04/18 11:22 PM  Result Value Ref Range   Glucose-Capillary 200 (H) 70 - 99 mg/dL  Glucose, capillary     Status: Abnormal   Collection Time: 09/05/18 12:11 AM  Result Value Ref Range   Glucose-Capillary 170 (H) 70 - 99 mg/dL  CBC     Status: Abnormal    Collection Time: 09/05/18  2:26 AM  Result Value Ref Range   WBC 5.8 4.0 - 10.5 K/uL   RBC 3.00 (L) 4.22 - 5.81 MIL/uL   Hemoglobin 8.6 (L) 13.0 - 17.0  g/dL   HCT 27.9 (L) 39.0 - 52.0 %   MCV 93.0 80.0 - 100.0 fL   MCH 28.7 26.0 - 34.0 pg   MCHC 30.8 30.0 - 36.0 g/dL   RDW 15.1 11.5 - 15.5 %   Platelets 169 150 - 400 K/uL   nRBC 0.0 0.0 - 0.2 %    Comment: Performed at [facility], [place] [address] [place], [place] [address]  Glucose, capillary     Status: Abnormal   Collection Time: 09/05/18  4:09 AM  Result Value Ref Range   Glucose-Capillary 157 (H) 70 - 99 mg/dL  MRSA PCR Screening     Status: None   Collection Time: 09/05/18  4:31 AM  Result Value Ref Range   MRSA by PCR NEGATIVE NEGATIVE    Comment:        The GeneXpert MRSA Assay (FDA approved for NASAL specimens only), is one component of a comprehensive MRSA colonization surveillance program. It is not intended to diagnose MRSA infection nor to guide or monitor treatment for MRSA infections. Performed at [facility], [place] [address] [place], [place] [address]   Glucose, capillary     Status: Abnormal   Collection Time: 09/05/18  7:40 AM  Result Value Ref Range   Glucose-Capillary 114 (H) 70 - 99 mg/dL  Glucose, capillary     Status: Abnormal   Collection Time: 09/05/18 11:00 AM  Result Value Ref Range   Glucose-Capillary 111 (H) 70 - 99 mg/dL  Renal function panel     Status: Abnormal   Collection Time: 09/05/18 12:11 PM  Result Value Ref Range   Sodium 131 (L) 135 - 145 mmol/L   Potassium 6.9 (HH) 3.5 - 5.1 mmol/L    Comment: NO VISIBLE HEMOLYSIS CRITICAL RESULT CALLED TO, READ BACK BY AND VERIFIED WITH: A MCPHERSON RN 1259 09/05/2018 BY A BENNETT    Chloride 111 98 - 111 mmol/L   CO2 14 (L) 22 - 32 mmol/L   Glucose, Bld 128 (H) 70 - 99 mg/dL   BUN 80 (H) 8 - 23 mg/dL   Creatinine, Ser 4.01 (H) 0.61 - 1.24 mg/dL   Calcium 7.9 (L) 8.9 - 10.3 mg/dL   Phosphorus 7.8 (H) 2.5 - 4.6 mg/dL    Albumin 2.4 (L) 3.5 - 5.0 g/dL   GFR calc non Af Amer 15 (L) >60 mL/min   GFR calc Af Amer 17 (L) >60 mL/min   Anion gap 6 5 - 15    Comment: Performed at [facility], [place] [address] [place], [place] [address]    Dg Chest 2 View  Result Date: 09/04/2018 CLINICAL DATA:  Weakness. EXAM: CHEST - 2 VIEW COMPARISON:  09/01/2018 FINDINGS: Left chest wall ICD is noted with leads in the right atrial appendage, and right ventricle. Mild cardiac enlargement. There is asymmetric elevation of the right hemidiaphragm. No airspace opacities identified. IMPRESSION: 1. No acute cardiopulmonary abnormalities. Electronically Signed   By: Kerby Moors M.D.   On: 09/04/2018 16:31   Ct Head Wo Contrast  Result Date: 09/04/2018 CLINICAL DATA:  62 y/o M; generalized weakness and involuntary shaking of the right arm. Patient is fallen several times over the past week. EXAM: CT HEAD WITHOUT CONTRAST TECHNIQUE: Contiguous axial images were obtained from the base of the skull through the vertex without intravenous contrast. COMPARISON:  01/27/2014 CT head FINDINGS: Brain: No evidence of acute infarction, hemorrhage, hydrocephalus, extra-axial collection or mass lesion/mass effect. Few stable nonspecific  white matter hypodensities compatible with mild chronic microvascular ischemic changes. Stable mild volume loss of the brain. Vascular: Calcific atherosclerosis of carotid siphons and vertebral arteries. Skull: Normal. Negative for fracture or focal lesion. Sinuses/Orbits: No acute finding. Other: None. IMPRESSION: 1. No acute intracranial abnormality identified. 2. Stable mild chronic microvascular ischemic changes and mild volume loss of the brain. Electronically Signed   By: Kristine Garbe M.D.   On: 09/04/2018 16:45   US Renal  Result Date: 09/05/2018 CLINICAL DATA:  62 year old male with acute renal insufficiency and elevated creatinine. EXAM: RENAL / URINARY TRACT ULTRASOUND COMPLETE COMPARISON:  CT  of the abdomen pelvis dated 11/25/2014 FINDINGS: Right Kidney: Renal measurements: 12.7 x 6.7 x 6.1 cm = volume: 270.5 mL. Mild increased echogenicity. No hydronephrosis or shadowing stone. Left Kidney: Renal measurements: 11.2 x 5.7 x 5.7 cm = volume: 186.7 mL. Mildly increased echogenicity. No hydronephrosis or shadowing stone. Bladder: The urinary bladder is decompressed around a Foley catheter. IMPRESSION: Mild increased renal echogenicity may represent medical renal disease. No hydronephrosis or shadowing stone. Electronically Signed   By: Anner Crete M.D.   On: 09/05/2018 04:43    Assessment/Plan  1.  AKI on CKD 3: likely hypovolemia-mediated in setting of C diff and continued compliance with Lasix and ACEi.  Hold these in setting of soft BP; give gentle IVF with bicarb gtt.  Making urine now so hopefully will get through without dialysis.  No obstruction on renal US.  Management of hyperK as below.  2.  Hyperkalemia: received hyperkalemia protocol in [facility] ED last night- labs done this afternoon reveal K of 6.9.  Calcium, bicarb gtt as above, additional doses of Lokelma.  Of note, I do not recommend LR as this as 4 mEq K in it as well.    3.  CHF:  EF 20-25%.  Judicious fluids as above, holding CHF meds with low pressues  4.  Recent C diff- treated with Flagyl and probiotic, diarrhea improved.    5.  Shakiness/weakness: likely related to #1 and 2.  Expect to improve with treatment of the above.  Hold gabapentin.  Madelon Lips 09/05/2018, 2:24 PM

## 2018-09-05 NOTE — Progress Notes (Signed)
K repeat 6.4, still with metabolic acidosis that is improved and improved renal function. Good urine output   redose with lokelma 10 g and will increase rate of bicarbonate IV to 100cc/hr    Recheck K at 10 pm tonight

## 2018-09-05 NOTE — Progress Notes (Signed)
PROGRESS NOTE  [name] YQM:[id number] DOB: 1956-06-16 DOA: 09/04/2018 PCP: Sinda Du, MD  HPI/Recap of past 24 hours: [name] is a 62 y.o. male with medical history significant for COPD, CKD, HTN, CHF- systolic and diastolic, AICD status, Atria fibrillation, who presented to the ED with complaints of involuntary shaking of his right hand over the past few days with multiple falls over the past week resulting from generalized weakness for the past 3 weeks.  Up until yesterday he was compliant with his Lasix, metolazone and lisinopril, and warfarin.  Patient reports he has not made urine since last night.  He denies nausea vomiting or abdominal pain.  Has maintained good p.o. intake. Recent diagnosis of C. difficile diarrhea 08/14/18, was prescribed Flagyl and started on a probiotic, he says this helped with the diarrhea, he denies stools over the past few days no vomiting no nausea no abdominal pain. Denies NSAID use. Lab studies remarkable for elevated cr 4.7 and hyperkalemia. TRH asked to admit. Nephrology consulted and following.  09/05/18: seen and examined at bedside. About 200 cc in the foley bag this am. No new complaints.   Assessment/Plan: Active Problems:   Diabetes mellitus, type II (HCC)   Atrial fibrillation (HCC)   ARF (acute renal failure) (HCC)   AKI on CKD 3 Suspect prerenal from dehydration cdiff and diuretics Renal US no obstruction Nephrology following. Highly appreciated Repeat bmp am Avoid nephrotoxic agents  Hyperkalemia Treated with calcium gluconate and lokelma Repeat BMP in the am  Non anion gap metabolic acidosis CO3- 14 C/w isotonic bicarb infusion 75cc/hr Repeat labs in the am Monitor fluid status in the setting of systolic CHF  P-afib on coumadin Subtherapeutic INR 1.61 warfarin on hold due to possible procedure May consider heparin drip if no contraindication  Systolic and diastolic CHF, atrial fibrillation, AICD status-  last echo 03/2017, EF 20 to 25%, G2DD. INR- 1.6.  BNP- 605, prior elevations to 1200. CHF appears stable. Follows with Dr. Lovena Le. -Hold Coreg, Lasix, metolazone, warfarin, lisinopril.  DM-no recent A1c on file -Hold home metformin Lantus 20 units twice daily - SSI - HGba1c  COPD- Stable.  -Continue home inhalers  Colon Cancer hx  2006 status post subtotal colectomy.  Requires at least 2 midnights due to AKI on CKD 3, hyperkalemia, metabolic acidosis in the setting of multiple comorbidities and advanced age. High risk for decompensation.   DVT prophylaxis:Scds Code Status: Full Family Communication: None at bedside Disposition Plan: Home when nephrology signs off. Consults called: Nephrology Consult   Objective: Vitals:   09/05/18 0411 09/05/18 0741 09/05/18 1120 09/05/18 1441  BP: (!) 118/45 (!) 130/51 (!) 122/44 (!) 125/54  Pulse: 68 66 70   Resp: 18 16 12    Temp: 98.8 F (37.1 C) 99.4 F (37.4 C) 100.1 F (37.8 C) 99.5 F (37.5 C)  TempSrc: Axillary Oral Oral Oral  SpO2: 93% 96% 92%   Weight:      Height:        Intake/Output Summary (Last 24 hours) at 09/05/2018 1651 Last data filed at 09/05/2018 1338 Gross per 24 hour  Intake 1372 ml  Output 200 ml  Net 1172 ml   Filed Weights   09/04/18 1509 09/05/18 0049  Weight: 79.4 kg 77.1 kg    Exam:  . General: 62 y.o. year-old male well developed well nourished in no acute distress.  Alert and oriented x3. . Cardiovascular: Regular rate and rhythm with no rubs or gallops.  No thyromegaly  or JVD noted.   [name] Respiratory: Clear to auscultation with no wheezes or rales. Good inspiratory effort. . Abdomen: Soft nontender nondistended with normal bowel sounds x4 quadrants. . Musculoskeletal: No lower extremity edema. 2/4 pulses in all 4 extremities. . Skin: No ulcerative lesions noted or rashes, . Psychiatry: Mood is appropriate for condition and setting   Data Reviewed: CBC: Recent Labs  Lab 09/04/18 1524  09/05/18 0226  WBC 6.9 5.8  NEUTROABS 4.9  --   HGB 10.1* 8.6*  HCT 32.5* 27.9*  MCV 94.8 93.0  PLT 173 409   Basic Metabolic Panel: Recent Labs  Lab 09/04/18 1524 09/04/18 1653 09/04/18 2019 09/05/18 1211  NA 130*  --  131* 131*  K >7.5* >7.5* 6.7* 6.9*  CL 114*  --  111 111  CO2 11*  --  12* 14*  GLUCOSE 122*  --  145* 128*  BUN 83*  --  85* 80*  CREATININE 4.77*  --  4.60* 4.01*  CALCIUM 7.8*  --  8.1* 7.9*  PHOS  --   --   --  7.8*   GFR: Estimated Creatinine Clearance: 20.8 mL/min (A) (by C-G formula based on SCr of 4.01 mg/dL (H)). Liver Function Tests: Recent Labs  Lab 09/04/18 1524 09/05/18 1211  AST 16  --   ALT 17  --   ALKPHOS 90  --   BILITOT 0.3  --   PROT 6.3*  --   ALBUMIN 3.1* 2.4*   Recent Labs  Lab 09/04/18 1524  LIPASE 105*   Recent Labs  Lab 09/04/18 1535  AMMONIA 18   Coagulation Profile: Recent Labs  Lab 09/04/18 1524  INR 1.61   Cardiac Enzymes: Recent Labs  Lab 09/04/18 1524  TROPONINI <0.03   BNP (last 3 results) No results for input(s): PROBNP in the last 8760 hours. HbA1C: Recent Labs    09/04/18 1524  HGBA1C 7.3*   CBG: Recent Labs  Lab 09/04/18 2322 09/05/18 0011 09/05/18 0409 09/05/18 0740 09/05/18 1100  GLUCAP 200* 170* 157* 114* 111*   Lipid Profile: No results for input(s): CHOL, HDL, LDLCALC, TRIG, CHOLHDL, LDLDIRECT in the last 72 hours. Thyroid Function Tests: Recent Labs    09/04/18 1548  TSH 1.026   Anemia Panel: No results for input(s): VITAMINB12, FOLATE, FERRITIN, TIBC, IRON, RETICCTPCT in the last 72 hours. Urine analysis:    Component Value Date/Time   COLORURINE YELLOW 09/04/2018 2115   APPEARANCEUR HAZY (A) 09/04/2018 2115   LABSPEC 1.015 09/04/2018 2115   PHURINE 5.0 09/04/2018 2115   GLUCOSEU NEGATIVE 09/04/2018 2115   HGBUR NEGATIVE 09/04/2018 2115   BILIRUBINUR NEGATIVE 09/04/2018 2115   [place] NEGATIVE 09/04/2018 2115   PROTEINUR 30 (A) 09/04/2018 2115    UROBILINOGEN 0.2 01/26/2014 0909   NITRITE NEGATIVE 09/04/2018 2115   LEUKOCYTESUR TRACE (A) 09/04/2018 2115   Sepsis Labs: @LABRCNTIP (procalcitonin:4,lacticidven:4)  ) Recent Results (from the past 240 hour(s))  MRSA PCR Screening     Status: None   Collection Time: 09/05/18  4:31 AM  Result Value Ref Range Status   MRSA by PCR NEGATIVE NEGATIVE Final    Comment:        The GeneXpert MRSA Assay (FDA approved for NASAL specimens only), is one component of a comprehensive MRSA colonization surveillance program. It is not intended to diagnose MRSA infection nor to guide or monitor treatment for MRSA infections. Performed at [facility], [place] [address], [place], [place] [address]  Studies: US Renal  Result Date: 09/05/2018 CLINICAL DATA:  61 year old male with acute renal insufficiency and elevated creatinine. EXAM: RENAL / URINARY TRACT ULTRASOUND COMPLETE COMPARISON:  CT of the abdomen pelvis dated 11/25/2014 FINDINGS: Right Kidney: Renal measurements: 12.7 x 6.7 x 6.1 cm = volume: 270.5 mL. Mild increased echogenicity. No hydronephrosis or shadowing stone. Left Kidney: Renal measurements: 11.2 x 5.7 x 5.7 cm = volume: 186.7 mL. Mildly increased echogenicity. No hydronephrosis or shadowing stone. Bladder: The urinary bladder is decompressed around a Foley catheter. IMPRESSION: Mild increased renal echogenicity may represent medical renal disease. No hydronephrosis or shadowing stone. Electronically Signed   By: Anner Crete M.D.   On: 09/05/2018 04:43    Scheduled Meds: . insulin aspart  0-9 Units Subcutaneous Q4H  . sodium zirconium cyclosilicate  10 g Oral Once  . sodium zirconium cyclosilicate  10 g Oral Daily    Continuous Infusions: . calcium gluconate    .  sodium bicarbonate  infusion 1000 mL 75 mL/hr at 09/05/18 1244     LOS: 1 day     Kayleen Memos, MD [facility] Pager [phone]  If 7PM-7AM, please contact  night-coverage [email or web address] Password [facility] 09/05/2018, 4:51 PM

## 2018-09-06 LAB — RENAL FUNCTION PANEL
Albumin: 2.3 g/dL — ABNORMAL LOW (ref 3.5–5.0)
Albumin: 2.2 g/dL — ABNORMAL LOW (ref 3.5–5.0)
Albumin: 2.1 g/dL — ABNORMAL LOW (ref 3.5–5.0)
Anion gap: 4 — ABNORMAL LOW (ref 5–15)
Anion gap: 5 (ref 5–15)
Anion gap: 8 (ref 5–15)
BUN: 71 mg/dL — ABNORMAL HIGH (ref 8–23)
BUN: 62 mg/dL — ABNORMAL HIGH (ref 8–23)
BUN: 79 mg/dL — AB (ref 8–23)
CHLORIDE: 109 mmol/L (ref 98–111)
CO2: 20 mmol/L — ABNORMAL LOW (ref 22–32)
CO2: 23 mmol/L (ref 22–32)
CO2: 23 mmol/L (ref 22–32)
Calcium: 7.6 mg/dL — ABNORMAL LOW (ref 8.9–10.3)
Calcium: 7.7 mg/dL — ABNORMAL LOW (ref 8.9–10.3)
Calcium: 7.5 mg/dL — ABNORMAL LOW (ref 8.9–10.3)
Chloride: 109 mmol/L (ref 98–111)
Chloride: 104 mmol/L (ref 98–111)
Creatinine, Ser: 3.38 mg/dL — ABNORMAL HIGH (ref 0.61–1.24)
Creatinine, Ser: 3.05 mg/dL — ABNORMAL HIGH (ref 0.61–1.24)
Creatinine, Ser: 2.58 mg/dL — ABNORMAL HIGH (ref 0.61–1.24)
GFR calc Af Amer: 21 mL/min — ABNORMAL LOW (ref >60)
GFR calc Af Amer: 24 mL/min — ABNORMAL LOW (ref >60)
GFR calc Af Amer: 30 mL/min — ABNORMAL LOW (ref >60)
GFR calc non Af Amer: 18 mL/min — ABNORMAL LOW (ref >60)
GFR calc non Af Amer: 21 mL/min — ABNORMAL LOW (ref >60)
GFR calc non Af Amer: 26 mL/min — ABNORMAL LOW (ref >60)
Glucose, Bld: 126 mg/dL — ABNORMAL HIGH (ref 70–99)
Glucose, Bld: 124 mg/dL — ABNORMAL HIGH (ref 70–99)
Glucose, Bld: 259 mg/dL — ABNORMAL HIGH (ref 70–99)
POTASSIUM: 4.9 mmol/L (ref 3.5–5.1)
Phosphorus: 6.5 mg/dL — ABNORMAL HIGH (ref 2.5–4.6)
Phosphorus: 6.1 mg/dL — ABNORMAL HIGH (ref 2.5–4.6)
Phosphorus: 5.2 mg/dL — ABNORMAL HIGH (ref 2.5–4.6)
Potassium: 5.9 mmol/L — ABNORMAL HIGH (ref 3.5–5.1)
Potassium: 5.2 mmol/L — ABNORMAL HIGH (ref 3.5–5.1)
Sodium: 133 mmol/L — ABNORMAL LOW (ref 135–145)
Sodium: 137 mmol/L (ref 135–145)
Sodium: 135 mmol/L (ref 135–145)

## 2018-09-06 LAB — HEPATITIS PANEL, ACUTE
Hep A IgM: NEGATIVE
Hep B C IgM: NEGATIVE
Hepatitis B Surface Ag: NEGATIVE

## 2018-09-06 LAB — CBC
HCT: 24 % — ABNORMAL LOW (ref 39.0–52.0)
Hemoglobin: 7.8 g/dL — ABNORMAL LOW (ref 13.0–17.0)
MCH: 29.1 pg (ref 26.0–34.0)
MCHC: 32.5 g/dL (ref 30.0–36.0)
MCV: 89.6 fL (ref 80.0–100.0)
Platelets: 146 10*3/uL — ABNORMAL LOW (ref 150–400)
RBC: 2.68 MIL/uL — AB (ref 4.22–5.81)
RDW: 15.2 % (ref 11.5–15.5)
WBC: 5.2 10*3/uL (ref 4.0–10.5)
nRBC: 0 % (ref 0.0–0.2)

## 2018-09-06 LAB — GLUCOSE, CAPILLARY
Glucose-Capillary: 114 mg/dL — ABNORMAL HIGH (ref 70–99)
Glucose-Capillary: 121 mg/dL — ABNORMAL HIGH (ref 70–99)
Glucose-Capillary: 190 mg/dL — ABNORMAL HIGH (ref 70–99)

## 2018-09-06 MED ORDER — HEPARIN SODIUM (PORCINE) 5000 UNIT/ML IJ SOLN
5000.0000 [IU] | Freq: Three times a day (TID) | INTRAMUSCULAR | Status: DC
  Administered 2018-09-06 – 2018-09-07 (×3): 5000 [IU] via SUBCUTANEOUS
  Filled 2018-09-06 (×3): qty 1

## 2018-09-06 NOTE — Progress Notes (Signed)
[facility] Progress Note    Assessment/ Plan:   1. AKI on CKD 3: likely hypovolemia-mediated in setting of C diff and continued compliance with Lasix and ACEi.  Hold these in setting of soft BP; give gentle IVF with bicarb gtt --> will d/c fluids today at 1700 and see how he does overnight.  Making urine now so hopefully will get through without dialysis.  No obstruction on renal US.  Management of hyperK as below.  2.  Hyperkalemia: received hyperkalemia protocol in [facility] ED, K finally coming down with aggressive treatment of K.  Calcium, bicarb gtt as above, additional doses of Lokelma.  Of note, I do not recommend LR as this as 4 mEq K in it as well.    3.  CHF:  EF 20-25%.  Judicious fluids as above, holding CHF meds with low pressues  4.  Recent C diff- treated with Flagyl and probiotic, diarrhea improved.    5.  Shakiness/weakness: likely related to #1 and 2.  Expect to improve with treatment of the above.  Hold gabapentin.  Subjective:    Feeling better "when do I get to go home?".  K improving, UOP picking up.  Myoclonus resolved.      Objective:   BP (!) 149/57 (BP Location: Left Arm)   Pulse 60   Temp 99.2 F (37.3 C) (Oral)   Resp 14   Ht 6' (1.829 m)   Wt 79.6 kg   SpO2 92%   BMI 23.80 kg/m   Intake/Output Summary (Last 24 hours) at 09/06/2018 1141 Last data filed at 09/06/2018 1110 Gross per 24 hour  Intake 2014.35 ml  Output 2300 ml  Net -285.65 ml   Weight change: 0.227 kg  Physical Exam: GEN older, chronically ill, NAD HEENT EOMI, PERRL NECK flat neck veins PULM clear bilaterally CV RRR no m/r/g ABD soft GU: + Foley in place, at least 350 cc urine EXT + sarcopenia, no LE edema NEURO AAO x 3, no myoclonus observed  Imaging: Dg Chest 2 View  Result Date: 09/04/2018 CLINICAL DATA:  Weakness. EXAM: CHEST - 2 VIEW COMPARISON:  09/01/2018 FINDINGS: Left chest wall ICD is noted with leads in the right atrial appendage, and right  ventricle. Mild cardiac enlargement. There is asymmetric elevation of the right hemidiaphragm. No airspace opacities identified. IMPRESSION: 1. No acute cardiopulmonary abnormalities. Electronically Signed   By: Kerby Moors M.D.   On: 09/04/2018 16:31   Ct Head Wo Contrast  Result Date: 09/04/2018 CLINICAL DATA:  62 y/o M; generalized weakness and involuntary shaking of the right arm. Patient is fallen several times over the past week. EXAM: CT HEAD WITHOUT CONTRAST TECHNIQUE: Contiguous axial images were obtained from the base of the skull through the vertex without intravenous contrast. COMPARISON:  01/27/2014 CT head FINDINGS: Brain: No evidence of acute infarction, hemorrhage, hydrocephalus, extra-axial collection or mass lesion/mass effect. Few stable nonspecific white matter hypodensities compatible with mild chronic microvascular ischemic changes. Stable mild volume loss of the brain. Vascular: Calcific atherosclerosis of carotid siphons and vertebral arteries. Skull: Normal. Negative for fracture or focal lesion. Sinuses/Orbits: No acute finding. Other: None. IMPRESSION: 1. No acute intracranial abnormality identified. 2. Stable mild chronic microvascular ischemic changes and mild volume loss of the brain. Electronically Signed   By: Kristine Garbe M.D.   On: 09/04/2018 16:45   US Renal  Result Date: 09/05/2018 CLINICAL DATA:  62 year old male with acute renal insufficiency and elevated creatinine. EXAM: RENAL / URINARY TRACT ULTRASOUND  COMPLETE COMPARISON:  CT of the abdomen pelvis dated 11/25/2014 FINDINGS: Right Kidney: Renal measurements: 12.7 x 6.7 x 6.1 cm = volume: 270.5 mL. Mild increased echogenicity. No hydronephrosis or shadowing stone. Left Kidney: Renal measurements: 11.2 x 5.7 x 5.7 cm = volume: 186.7 mL. Mildly increased echogenicity. No hydronephrosis or shadowing stone. Bladder: The urinary bladder is decompressed around a Foley catheter. IMPRESSION: Mild increased renal  echogenicity may represent medical renal disease. No hydronephrosis or shadowing stone. Electronically Signed   By: Anner Crete M.D.   On: 09/05/2018 04:43    Labs: BMET Recent Labs  Lab 09/04/18 1524 09/04/18 1653 09/04/18 2019 09/05/18 1211 09/05/18 1852 09/06/18 0009 09/06/18 0547  NA 130*  --  131* 131* 134* 133* 137  K >7.5* >7.5* 6.7* 6.9* 6.4* 5.9* 5.2*  CL 114*  --  111 111 111 109 109  CO2 11*  --  12* 14* 17* 20* 23  GLUCOSE 122*  --  145* 128* 155* 126* 124*  BUN 83*  --  85* 80* 79* 79* 71*  CREATININE 4.77*  --  4.60* 4.01* 3.82* 3.38* 3.05*  CALCIUM 7.8*  --  8.1* 7.9* 7.9* 7.6* 7.7*  PHOS  --   --   --  7.8* 7.3* 6.5* 6.1*   CBC Recent Labs  Lab 09/04/18 1524 09/05/18 0226 09/06/18 0009  WBC 6.9 5.8 5.2  NEUTROABS 4.9  --   --   HGB 10.1* 8.6* 7.8*  HCT 32.5* 27.9* 24.0*  MCV 94.8 93.0 89.6  PLT 173 169 146*    Medications:    . insulin aspart  0-9 Units Subcutaneous Q4H  . sodium zirconium cyclosilicate  10 g Oral Once  . sodium zirconium cyclosilicate  10 g Oral Daily      Madelon Lips, MD 09/06/2018, 11:41 AM

## 2018-09-06 NOTE — Progress Notes (Signed)
PROGRESS NOTE  [name] HAL:[id number] DOB: 10/28/1955 DOA: 09/04/2018 PCP: Sinda Du, MD  HPI/Recap of past 24 hours: [name] is a 62 y.o. male with medical history significant for COPD, CKD, HTN, CHF- systolic and diastolic, AICD status, Atria fibrillation, who presented to the ED with complaints of involuntary shaking of his right hand over the past few days with multiple falls over the past week resulting from generalized weakness for the past 3 weeks.  Up until yesterday he was compliant with his Lasix, metolazone and lisinopril, and warfarin.  Patient reports he has not made urine since last night.  He denies nausea vomiting or abdominal pain.  Has maintained good p.o. intake. Recent diagnosis of C. difficile diarrhea 08/14/18, was prescribed Flagyl and started on a probiotic, he says this helped with the diarrhea, he denies stools over the past few days no vomiting no nausea no abdominal pain. Denies NSAID use. Lab studies remarkable for elevated cr 4.7 and hyperkalemia. TRH asked to admit. Nephrology consulted and following.  09/05/18: seen and examined at bedside. About 200 cc in the foley bag this am. No new complaints.   Assessment/Plan: Active Problems:   Diabetes mellitus, type II (HCC)   Atrial fibrillation (HCC)   ARF (acute renal failure) (HCC)   AKI on CKD 3 Suspect prerenal from dehydration cdiff and diuretics Renal US no obstruction Nephrology following. Highly appreciated Cr still above 3 Continue to monitor urine output remove indwelling foley  Urinary retention Follows w urology outpatient Remove foley and do voiding trial  Hyperkalemia Treated with calcium gluconate and lokelma improving Nephrology following  Resolved Non anion gap metabolic acidosis Stop isotonic bicarb infusion 75cc/hr Repeat labs in the am Monitor fluid status in the setting of systolic CHF  P-afib on coumadin Subtherapeutic INR 1.61 warfarin on hold due to  possible procedure May consider heparin drip if no contraindication  Systolic and diastolic CHF, atrial fibrillation, AICD status- last echo 03/2017, EF 20 to 25%, G2DD. INR- 1.6.  BNP- 605, prior elevations to 1200. CHF appears stable. Follows with Dr. Lovena Le. -Hold Coreg, Lasix, metolazone, warfarin, lisinopril. -Repeat 2D echo  DM-no recent A1c on file -Hold home metformin Lantus 20 units twice daily - SSI - HGba1c  COPD- Stable.  -Continue home inhalers  Colon Cancer hx  2006 status post subtotal colectomy.  Requires at least 2 midnights due to AKI on CKD 3, hyperkalemia, metabolic acidosis in the setting of multiple comorbidities and advanced age. High risk for decompensation.   DVT prophylaxis:Scds Code Status: Full Family Communication: None at bedside Disposition Plan: Home when nephrology signs off. Consults called: Nephrology Consult   Objective: Vitals:   09/06/18 0459 09/06/18 0859 09/06/18 1408 09/06/18 1728  BP: (!) 129/57 (!) 149/57 (!) 147/56 (!) 155/58  Pulse:  60 60 (!) 59  Resp: 10 14 17 16   Temp: 98.6 F (37 C) 99.2 F (37.3 C) 98.5 F (36.9 C) 99.7 F (37.6 C)  TempSrc: Axillary Oral Oral Oral  SpO2:  92% 92% 92%  Weight: 79.6 kg     Height:        Intake/Output Summary (Last 24 hours) at 09/06/2018 1753 Last data filed at 09/06/2018 1730 Gross per 24 hour  Intake 2152.35 ml  Output 2050 ml  Net 102.35 ml   Filed Weights   09/04/18 1509 09/05/18 0049 09/06/18 0459  Weight: 79.4 kg 77.1 kg 79.6 kg    Exam:  . General: 62 y.o. year-old male WD WN  NAD A&O x 3 . Cardiovascular: RRR no rubs or gallops. No JVD or thyromegaly . Respiratory: CTA no wheezes or rales. Good inspiratory efforts . Abdomen: Soft nontender nondistended with normal bowel sounds x4 quadrants. . Musculoskeletal: No lower extremity edema. 2/4 pulses in all 4 extremities. . Skin: No ulcerative lesions noted or rashes . Psychiatry: Mood is appropriate for condition  and setting   Data Reviewed: CBC: Recent Labs  Lab 09/04/18 1524 09/05/18 0226 09/06/18 0009  WBC 6.9 5.8 5.2  NEUTROABS 4.9  --   --   HGB 10.1* 8.6* 7.8*  HCT 32.5* 27.9* 24.0*  MCV 94.8 93.0 89.6  PLT 173 169 297*   Basic Metabolic Panel: Recent Labs  Lab 09/05/18 1211 09/05/18 1852 09/06/18 0009 09/06/18 0547 09/06/18 1440  NA 131* 134* 133* 137 135  K 6.9* 6.4* 5.9* 5.2* 4.9  CL 111 111 109 109 104  CO2 14* 17* 20* 23 23  GLUCOSE 128* 155* 126* 124* 259*  BUN 80* 79* 79* 71* 62*  CREATININE 4.01* 3.82* 3.38* 3.05* 2.58*  CALCIUM 7.9* 7.9* 7.6* 7.7* 7.5*  PHOS 7.8* 7.3* 6.5* 6.1* 5.2*   GFR: Estimated Creatinine Clearance: 32.6 mL/min (A) (by C-G formula based on SCr of 2.58 mg/dL (H)). Liver Function Tests: Recent Labs  Lab 09/04/18 1524 09/05/18 1211 09/05/18 1852 09/06/18 0009 09/06/18 0547 09/06/18 1440  AST 16  --   --   --   --   --   ALT 17  --   --   --   --   --   ALKPHOS 90  --   --   --   --   --   BILITOT 0.3  --   --   --   --   --   PROT 6.3*  --   --   --   --   --   ALBUMIN 3.1* 2.4* 2.3* 2.3* 2.2* 2.1*   Recent Labs  Lab 09/04/18 1524  LIPASE 105*   Recent Labs  Lab 09/04/18 1535  AMMONIA 18   Coagulation Profile: Recent Labs  Lab 09/04/18 1524  INR 1.61   Cardiac Enzymes: Recent Labs  Lab 09/04/18 1524  TROPONINI <0.03   BNP (last 3 results) No results for input(s): PROBNP in the last 8760 hours. HbA1C: Recent Labs    09/04/18 1524  HGBA1C 7.3*   CBG: Recent Labs  Lab 09/05/18 1100 09/05/18 1627 09/05/18 2110 09/06/18 0003 09/06/18 1635  GLUCAP 111* 183* 107* 121* 190*   Lipid Profile: No results for input(s): CHOL, HDL, LDLCALC, TRIG, CHOLHDL, LDLDIRECT in the last 72 hours. Thyroid Function Tests: Recent Labs    09/04/18 1548  TSH 1.026   Anemia Panel: No results for input(s): VITAMINB12, FOLATE, FERRITIN, TIBC, IRON, RETICCTPCT in the last 72 hours. Urine analysis:    Component Value  Date/Time   COLORURINE YELLOW 09/04/2018 2115   APPEARANCEUR HAZY (A) 09/04/2018 2115   LABSPEC 1.015 09/04/2018 2115   PHURINE 5.0 09/04/2018 2115   GLUCOSEU NEGATIVE 09/04/2018 2115   HGBUR NEGATIVE 09/04/2018 2115   BILIRUBINUR NEGATIVE 09/04/2018 2115   [place] NEGATIVE 09/04/2018 2115   PROTEINUR 30 (A) 09/04/2018 2115   UROBILINOGEN 0.2 01/26/2014 0909   NITRITE NEGATIVE 09/04/2018 2115   LEUKOCYTESUR TRACE (A) 09/04/2018 2115   Sepsis Labs: @LABRCNTIP (procalcitonin:4,lacticidven:4)  ) Recent Results (from the past 240 hour(s))  MRSA PCR Screening     Status: None   Collection Time: 09/05/18  4:31 AM  Result Value Ref Range Status   MRSA by PCR NEGATIVE NEGATIVE Final    Comment:        The GeneXpert MRSA Assay (FDA approved for NASAL specimens only), is one component of a comprehensive MRSA colonization surveillance program. It is not intended to diagnose MRSA infection nor to guide or monitor treatment for MRSA infections. Performed at [facility], [place] [address], [place], [place] [address]   Culture, blood (routine x 2)     Status: None (Preliminary result)   Collection Time: 09/05/18  9:07 PM  Result Value Ref Range Status   Specimen Description BLOOD RIGHT ANTECUBITAL  Final   Special Requests   Final    BOTTLES DRAWN AEROBIC AND ANAEROBIC Blood Culture adequate volume   Culture   Final    NO GROWTH < 12 HOURS Performed at [facility], [place] [address] [place], [place] [address]    Report Status PENDING  Incomplete  Culture, blood (routine x 2)     Status: None (Preliminary result)   Collection Time: 09/05/18  9:15 PM  Result Value Ref Range Status   Specimen Description BLOOD RIGHT HAND  Final   Special Requests   Final    BOTTLES DRAWN AEROBIC AND ANAEROBIC Blood Culture adequate volume   Culture   Final    NO GROWTH < 12 HOURS Performed at [facility], [place] [address], [place], [place] [address]    Report Status PENDING   Incomplete      Studies: No results found.  Scheduled Meds: . insulin aspart  0-9 Units Subcutaneous Q4H  . sodium zirconium cyclosilicate  10 g Oral Once  . sodium zirconium cyclosilicate  10 g Oral Daily    Continuous Infusions:    LOS: 2 days     Kayleen Memos, MD [facility] Pager [phone]  If 7PM-7AM, please contact night-coverage [email or web address] Password [email or web address] 09/06/2018, 5:53 PM

## 2018-09-06 NOTE — Consult Note (Signed)
   [facility] CM Inpatient Consult   09/06/2018  [name] 04-Apr-1956 [id number]   Patient screened for potential [facility] Care Management services due to unplanned readmission risk score of 26%.  Spoke with patient at bedside explaining community care management services. Patient declines [facility] Care Management services at this time. Informational brochure left at bedside.  Netta Cedars, MSN, [facility] Liaison Nurse Mobile Phone [phone]  Toll free office [phone]

## 2018-09-07 ENCOUNTER — Inpatient Hospital Stay (HOSPITAL_COMMUNITY): Payer: Medicare HMO

## 2018-09-07 DIAGNOSIS — I361 Nonrheumatic tricuspid (valve) insufficiency: Secondary | ICD-10-CM

## 2018-09-07 DIAGNOSIS — I370 Nonrheumatic pulmonary valve stenosis: Secondary | ICD-10-CM

## 2018-09-07 DIAGNOSIS — I340 Nonrheumatic mitral (valve) insufficiency: Secondary | ICD-10-CM

## 2018-09-07 LAB — BASIC METABOLIC PANEL
Anion gap: 7 (ref 5–15)
BUN: 54 mg/dL — ABNORMAL HIGH (ref 8–23)
CO2: 24 mmol/L (ref 22–32)
Calcium: 7.8 mg/dL — ABNORMAL LOW (ref 8.9–10.3)
Chloride: 107 mmol/L (ref 98–111)
Creatinine, Ser: 2.06 mg/dL — ABNORMAL HIGH (ref 0.61–1.24)
GFR calc Af Amer: 39 mL/min — ABNORMAL LOW (ref >60)
GFR calc non Af Amer: 34 mL/min — ABNORMAL LOW (ref >60)
Glucose, Bld: 131 mg/dL — ABNORMAL HIGH (ref 70–99)
Potassium: 5.1 mmol/L (ref 3.5–5.1)
Sodium: 138 mmol/L (ref 135–145)

## 2018-09-07 LAB — CBC
HCT: 26.2 % — ABNORMAL LOW (ref 39.0–52.0)
Hemoglobin: 8.5 g/dL — ABNORMAL LOW (ref 13.0–17.0)
MCH: 29.3 pg (ref 26.0–34.0)
MCHC: 32.4 g/dL (ref 30.0–36.0)
MCV: 90.3 fL (ref 80.0–100.0)
NRBC: 0 % (ref 0.0–0.2)
Platelets: 160 10*3/uL (ref 150–400)
RBC: 2.9 MIL/uL — ABNORMAL LOW (ref 4.22–5.81)
RDW: 14.8 % (ref 11.5–15.5)
WBC: 5.2 10*3/uL (ref 4.0–10.5)

## 2018-09-07 LAB — ECHOCARDIOGRAM COMPLETE
Height: 72 in
Weight: 2786.61 oz

## 2018-09-07 LAB — GLUCOSE, CAPILLARY
GLUCOSE-CAPILLARY: 120 mg/dL — AB (ref 70–99)
Glucose-Capillary: 110 mg/dL — ABNORMAL HIGH (ref 70–99)
Glucose-Capillary: 140 mg/dL — ABNORMAL HIGH (ref 70–99)
Glucose-Capillary: 153 mg/dL — ABNORMAL HIGH (ref 70–99)
Glucose-Capillary: 205 mg/dL — ABNORMAL HIGH (ref 70–99)

## 2018-09-07 LAB — PROTIME-INR
INR: 1.17
PROTHROMBIN TIME: 14.8 s (ref 11.4–15.2)

## 2018-09-07 MED ORDER — WARFARIN SODIUM 2 MG PO TABS: 12.0000 mg | ORAL_TABLET | Freq: Once | ORAL | Status: DC

## 2018-09-07 MED ORDER — WARFARIN SODIUM 4 MG PO TABS: 8.0000 mg | ORAL_TABLET | Freq: Every day | ORAL | 0 refills | Status: DC

## 2018-09-07 MED ORDER — FUROSEMIDE 40 MG PO TABS
40.0000 mg | ORAL_TABLET | Freq: Every day | ORAL | Status: DC
  Administered 2018-09-07: 40 mg via ORAL
  Filled 2018-09-07: qty 1

## 2018-09-07 MED ORDER — WARFARIN - PHARMACIST DOSING INPATIENT: Freq: Every day | Status: DC

## 2018-09-07 MED ORDER — CARVEDILOL 3.125 MG PO TABS: 3.1250 mg | ORAL_TABLET | Freq: Two times a day (BID) | ORAL | 0 refills | Status: DC

## 2018-09-07 MED ORDER — FUROSEMIDE 40 MG PO TABS: 40.0000 mg | ORAL_TABLET | Freq: Every day | ORAL | 0 refills | Status: DC

## 2018-09-07 MED ORDER — WARFARIN SODIUM 2 MG PO TABS
12.0000 mg | ORAL_TABLET | Freq: Once | ORAL | Status: AC
  Administered 2018-09-07: 12 mg via ORAL
  Filled 2018-09-07: qty 1

## 2018-09-07 NOTE — Progress Notes (Signed)
PROGRESS NOTE  [name] KDX:[id number] DOB: 03/31/1956 DOA: 09/04/2018 PCP: Sinda Du, MD  HPI/Recap of past 24 hours: [name] is a 62 y.o. male with past medical history significant for COPD, CKD 3, HTN, CHF- systolic and diastolic, post AICD, paroxysmal atrial fibrillation on Coumadin, who presented to the ED with complaints of involuntary shaking of his right hand over the past few days associated with multiple falls.  Compliant with his Lasix, metolazone, lisinopril, and warfarin.  Patient reports he has not made urine in nearly 24 hours.  No nausea vomiting or abdominal pain.  Has maintained good p.o. intake. Recent diagnosis of C. difficile diarrhea 08/14/18, was prescribed Flagyl and started on a probiotic.  On presentation to the ED, lab studies remarkable for elevated cr 4.7 and significant hyperkalemia >7.5. TRH asked to admit. Nephrology consulted and followed.  Highly appreciated.  Renal function continues to improve with good urine output.  Removed Foley catheter on 09/06/2018 which was initially inserted due to urinary retention.  Has been voiding without any difficulty.    09/06/2018: Patient seen and examined at his bedside.  No acute events overnight.  Admit to some weakness.  Would like to remove telemetry monitoring off his chest.  Vital signs currently stable.  INR subtherapeutic.  Pharmacy adjusting doses.   Assessment/Plan: Active Problems:   Diabetes mellitus, type II (HCC)   Atrial fibrillation (HCC)   ARF (acute renal failure) (HCC)   AKI on CKD 3, improving Suspect prerenal from dehydration with combination of Cdiff and diuretics Presented with creatinine of 4.77 Renal US no obstruction Nephrology following. Highly appreciated Creatinine continues to trend down to 2.06 Foley catheter removed on 09/06/2018 Good urine output; 1200 cc recorded in the last 24 hours  Resolved urinary retention Follows w urology outpatient Remove foley and do  voiding trial  Resolving hyperkalemia Treated with calcium gluconate and lokelma Can restart Lasix 40 mg daily as recommended by nephrology Can restart Coreg on discharge Continue to hold lisinopril due to AKI Repeat BMP at PCP's office on discharge  Resolved Non anion gap metabolic acidosis Isotonic bicarb infusion stopped on 09/06/2018   P-afib on coumadin Subtherapeutic INR 1.1 Resume warfarin Pharmacy managing  Systolic and diastolic CHF, atrial fibrillation, AICD status- last echo 03/2017, EF 20 to 25%, G2DD. INR- 1.6.  BNP- 605, prior elevations to 1200. CHF appears stable. Follows with Dr. Lovena Le. -Hold Coreg, Lasix, metolazone, warfarin, lisinopril. -Repeat 2D echo pending  Type 2 diabetes with hyperglycemia -Hemoglobin A1c 7.3 on 09/04/2018 -Continue insulin sliding scale  COPD- Stable.  -Continue home COPD medications  Colon Cancer hx  2006 status post subtotal colectomy.    DVT prophylaxis: Coumadin Code Status: Full  Disposition Plan:  Home when his INR is close to therapeutic level  Consults called: Nephrology    Objective: Vitals:   09/07/18 0014 09/07/18 0406 09/07/18 0758 09/07/18 1119  BP: (!) 145/71 (!) 133/56 (!) 153/64 (!) 158/66  Pulse: (!) 59 (!) 59 63 63  Resp:  13 16 14   Temp: 98.7 F (37.1 C) (!) 97.5 F (36.4 C) 97.9 F (36.6 C) 99.2 F (37.3 C)  TempSrc: Oral Oral Axillary Oral  SpO2: 91% 90% 97% 96%  Weight:  79 kg    Height:        Intake/Output Summary (Last 24 hours) at 09/07/2018 1200 Last data filed at 09/07/2018 0800 Gross per 24 hour  Intake 462 ml  Output 600 ml  Net -138 ml  Filed Weights   09/05/18 0049 09/06/18 0459 09/07/18 0406  Weight: 77.1 kg 79.6 kg 79 kg    Exam:  . General: 62 y.o. year-old male well-developed well-nourished in no acute distress.  Alert and oriented x3. . Cardiovascular: Regular rate and rhythm with no rubs or gallops.  No JVD or thyromegaly noted.  Respiratory: Mild rales at bases  bilaterally with no wheezes.  Good inspiratory effort. . Abdomen: Soft nontender nondistended with normal bowel sounds x4 quadrants. . Musculoskeletal: No lower extremity edema.  2 out of 4 pulses in all 4 extremities. . Skin: No ulcerative lesions noted or rashes . Psychiatry: Mood is appropriate for condition and setting   Data Reviewed: CBC: Recent Labs  Lab 09/04/18 1524 09/05/18 0226 09/06/18 0009 09/07/18 0620  WBC 6.9 5.8 5.2 5.2  NEUTROABS 4.9  --   --   --   HGB 10.1* 8.6* 7.8* 8.5*  HCT 32.5* 27.9* 24.0* 26.2*  MCV 94.8 93.0 89.6 90.3  PLT 173 169 146* 664   Basic Metabolic Panel: Recent Labs  Lab 09/05/18 1211 09/05/18 1852 09/06/18 0009 09/06/18 0547 09/06/18 1440 09/07/18 0327  NA 131* 134* 133* 137 135 138  K 6.9* 6.4* 5.9* 5.2* 4.9 5.1  CL 111 111 109 109 104 107  CO2 14* 17* 20* 23 23 24   GLUCOSE 128* 155* 126* 124* 259* 131*  BUN 80* 79* 79* 71* 62* 54*  CREATININE 4.01* 3.82* 3.38* 3.05* 2.58* 2.06*  CALCIUM 7.9* 7.9* 7.6* 7.7* 7.5* 7.8*  PHOS 7.8* 7.3* 6.5* 6.1* 5.2*  --    GFR: Estimated Creatinine Clearance: 40.8 mL/min (A) (by C-G formula based on SCr of 2.06 mg/dL (H)). Liver Function Tests: Recent Labs  Lab 09/04/18 1524 09/05/18 1211 09/05/18 1852 09/06/18 0009 09/06/18 0547 09/06/18 1440  AST 16  --   --   --   --   --   ALT 17  --   --   --   --   --   ALKPHOS 90  --   --   --   --   --   BILITOT 0.3  --   --   --   --   --   PROT 6.3*  --   --   --   --   --   ALBUMIN 3.1* 2.4* 2.3* 2.3* 2.2* 2.1*   Recent Labs  Lab 09/04/18 1524  LIPASE 105*   Recent Labs  Lab 09/04/18 1535  AMMONIA 18   Coagulation Profile: Recent Labs  Lab 09/04/18 1524 09/07/18 0620  INR 1.61 1.17   Cardiac Enzymes: Recent Labs  Lab 09/04/18 1524  TROPONINI <0.03   BNP (last 3 results) No results for input(s): PROBNP in the last 8760 hours. HbA1C: Recent Labs    09/04/18 1524  HGBA1C 7.3*   CBG: Recent Labs  Lab 09/06/18 1959  09/07/18 0017 09/07/18 0408 09/07/18 0754 09/07/18 1116  GLUCAP 114* 140* 120* 153* 205*   Lipid Profile: No results for input(s): CHOL, HDL, LDLCALC, TRIG, CHOLHDL, LDLDIRECT in the last 72 hours. Thyroid Function Tests: Recent Labs    09/04/18 1548  TSH 1.026   Anemia Panel: No results for input(s): VITAMINB12, FOLATE, FERRITIN, TIBC, IRON, RETICCTPCT in the last 72 hours. Urine analysis:    Component Value Date/Time   COLORURINE YELLOW 09/04/2018 2115   APPEARANCEUR HAZY (A) 09/04/2018 2115   LABSPEC 1.015 09/04/2018 2115   PHURINE 5.0 09/04/2018 2115   GLUCOSEU NEGATIVE 09/04/2018 2115  HGBUR NEGATIVE 09/04/2018 2115   [place] NEGATIVE 09/04/2018 2115   [place] NEGATIVE 09/04/2018 2115   PROTEINUR 30 (A) 09/04/2018 2115   UROBILINOGEN 0.2 01/26/2014 0909   NITRITE NEGATIVE 09/04/2018 2115   LEUKOCYTESUR TRACE (A) 09/04/2018 2115   Sepsis Labs: @LABRCNTIP (procalcitonin:4,lacticidven:4)  ) Recent Results (from the past 240 hour(s))  MRSA PCR Screening     Status: None   Collection Time: 09/05/18  4:31 AM  Result Value Ref Range Status   MRSA by PCR NEGATIVE NEGATIVE Final    Comment:        The GeneXpert MRSA Assay (FDA approved for NASAL specimens only), is one component of a comprehensive MRSA colonization surveillance program. It is not intended to diagnose MRSA infection nor to guide or monitor treatment for MRSA infections. Performed at [facility], [place] [address], [place], [place] [address]   Culture, blood (routine x 2)     Status: None (Preliminary result)   Collection Time: 09/05/18  9:07 PM  Result Value Ref Range Status   Specimen Description BLOOD RIGHT ANTECUBITAL  Final   Special Requests   Final    BOTTLES DRAWN AEROBIC AND ANAEROBIC Blood Culture adequate volume   Culture   Final    NO GROWTH < 12 HOURS Performed at [facility], [place] [address], [place], [place] [address]    Report Status PENDING  Incomplete    Culture, blood (routine x 2)     Status: None (Preliminary result)   Collection Time: 09/05/18  9:15 PM  Result Value Ref Range Status   Specimen Description BLOOD RIGHT HAND  Final   Special Requests   Final    BOTTLES DRAWN AEROBIC AND ANAEROBIC Blood Culture adequate volume   Culture   Final    NO GROWTH < 12 HOURS Performed at [facility], [place] [address] [place], [place] [address]    Report Status PENDING  Incomplete      Studies: Dg Chest Port 1 View  Result Date: 09/07/2018 CLINICAL DATA:  Hypoxia, dyspnea EXAM: PORTABLE CHEST 1 VIEW COMPARISON:  09/04/2018 chest radiograph. FINDINGS: Stable configuration of 3 lead left subclavian ICD. Stable cardiomediastinal silhouette with mild cardiomegaly. No pneumothorax. Small right pleural effusion, slightly increased. No left pleural effusion. Cephalization of the pulmonary vasculature without overt pulmonary edema. Mild right lung base atelectasis. IMPRESSION: 1. Stable mild cardiomegaly without overt pulmonary edema. 2. Small right pleural effusion and right lung base atelectasis, mildly increased. Electronically Signed   By: Ilona Sorrel M.D.   On: 09/07/2018 09:34    Scheduled Meds: . furosemide  40 mg Oral Daily  . heparin injection (subcutaneous)  5,000 Units Subcutaneous Q8H  . insulin aspart  0-9 Units Subcutaneous Q4H  . sodium zirconium cyclosilicate  10 g Oral Once  . sodium zirconium cyclosilicate  10 g Oral Daily  . warfarin  12 mg Oral ONCE-1800  . Warfarin - Pharmacist Dosing Inpatient   Does not apply q1800    Continuous Infusions:    LOS: 3 days     Kayleen Memos, MD [facility] Pager [phone]  If 7PM-7AM, please contact night-coverage [email or web address] Password [email or web address] 09/07/2018, 12:00 PM

## 2018-09-07 NOTE — Discharge Summary (Signed)
Discharge Summary  [name] KNL:[id number] DOB: 1956/04/22  PCP: [name] Du, MD  Admit date: 09/04/2018 Discharge date: 09/07/2018  Time spent: 35 minutes  Recommendations for Outpatient Follow-up:  1. Follow-up with your cardiologist 2. Follow-up with your PCP 3. Repeat INR Monday, 09/11/2018 4. Repeat BMP Monday, 09/11/2018 5. Continue physical therapy 6. Fall precautions  Discharge Diagnoses:  Active Hospital Problems   Diagnosis Date Noted  . ARF (acute renal failure) ([place]) 09/04/2018  . Atrial fibrillation ([place]) 02/04/2014  . Diabetes mellitus, type II [facility])     Resolved Hospital Problems  No resolved problems to display.    Discharge Condition: Patient is adamant about leaving today 09/07/2018.  Agreed to closely follow-up with his PCP.  States he will see his PCP tomorrow 09/08/2018 and have his INR followed.  Vitals:   09/07/18 0758 09/07/18 1119  BP: (!) 153/64 (!) 158/66  Pulse: 63 63  Resp: 16 14  Temp: 97.9 F (36.6 C) 99.2 F (37.3 C)  SpO2: 97% 96%    History of present illness:  [name] L Lesteris a 62 y.o.malewith past medical history significantfor COPD, CKD 3, HTN, CHF- systolic anddiastolic, post AICD, paroxysmal atrial fibrillation on Coumadin,who presented to the ED with complaints of involuntary shaking of his right hand over the past few days associated with multiple falls.Compliant with his Lasix,metolazone, lisinopril,and warfarin.Patient reports he has not made urinein nearly 24 hours. No nausea vomiting or abdominal pain. Has maintained good p.o. intake. Recent diagnosis of C. difficile diarrhea11/11/19,was prescribed Flagyl and started on a probiotic.  On presentation to the ED, lab studies remarkable for elevated cr 4.7 and significant hyperkalemia >7.5. TRH asked to admit. Nephrology consulted and followed.  Highly appreciated.  Renal function continues to improve with good urine output.  Removed Foley catheter on  09/06/2018 which was initially inserted due to urinary retention.  Has been voiding without any difficulty.    09/07/2018: Patient seen and examined at his bedside.  No acute events overnight.  Admit to some weakness.  Would like to remove telemetry monitoring off his chest.  Vital signs currently stable.  INR subtherapeutic.  Pharmacy adjusting doses. OT/PT recommend home health OT and PT.  Patient is adamant about leaving today  Hospital Course:  Active Problems:   Diabetes mellitus, type II (HCC)   Atrial fibrillation ([place])   ARF (acute renal failure) (HCC)  AKI on CKD 3, improving Suspect prerenal from dehydration with combination of Cdiff and diuretics Presented with creatinine of 4.77 Renal US no obstruction Nephrology following. Highly appreciated Creatinine continues to trend down to 2.06 Foley catheter removed on 09/06/2018 Good urine output; 1200 cc recorded in the last 24 hours Follow-up with your PCP to repeat BMP on Monday, 09/11/2018  Subtherapeutic INR INR 1.17 Pharmacy dosing Coumadin 12 mg once given today 09/07/2018 Continue Coumadin 8 mg daily Follow-up with your PCP on Monday, 09/11/2018 to repeat INR  Resolved urinary retention Follows w urology outpatient Remove foley and do voiding trial  Resolving hyperkalemia Treated with calcium gluconate and lokelma Can restart Lasix 40 mg daily as recommended by nephrology Can restart Coreg on discharge Continue to hold lisinopril due to AKI Repeat BMP at PCP's office on discharge  Resolved Non anion gap metabolic acidosis Isotonic bicarb infusion stopped on 09/06/2018   P-afib on coumadin Subtherapeutic INR 1.1 Resume warfarin Pharmacy managing  Systolic and diastolic CHF,atrial fibrillation,AICD status-last echo 03/2017,EF 20 to 25%, G2DD. INR- 1.6.BNP- 605, prior elevations to 1200. CHF appears stable.Follows  with Dr. Lovena [name]. -Hold Coreg,Lasix, metolazone, warfarin,lisinopril. -Repeat 2D echo  pending  Type 2 diabetes with hyperglycemia -Hemoglobin A1c 7.3 on 09/04/2018 -Continue insulin sliding scale  COPD- Stable.  -Continue home COPD medications  ColonCancer [id number] status post subtotal colectomy.  Medication and medical management noncompliance  Patient needs reinforcement    DVT prophylaxis: Coumadin Code Status:Full  Disposition Plan:  Patient is adamant about leaving today 09/07/18.  Consults called:Nephrology    Discharge Exam: BP (!) 158/66 (BP Location: Left Arm)   Pulse 63   Temp 99.2 F (37.3 C) (Oral)   Resp 14   Ht 6' (1.829 m)   Wt 79 kg   SpO2 96%   BMI 23.62 kg/m  . General: 62 y.o. year-old male well developed well nourished in no acute distress.  Alert and oriented x3. . Cardiovascular: Regular rate and rhythm with no rubs or gallops.  No thyromegaly or JVD noted.   [name] Respiratory: Clear to auscultation with no wheezes or rales. Good inspiratory effort. . Abdomen: Soft nontender nondistended with normal bowel sounds x4 quadrants. . Musculoskeletal: No lower extremity edema. 2/4 pulses in all 4 extremities. . Skin: No ulcerative lesions noted or rashes, . Psychiatry: Mood is appropriate for condition and setting  Discharge Instructions You were cared for by a hospitalist during your hospital stay. If you have any questions about your discharge medications or the care you received while you were in the hospital after you are discharged, you can call the unit and asked to speak with the hospitalist on call if the hospitalist that took care of you is not available. Once you are discharged, your primary care physician will handle any further medical issues. Please note that NO REFILLS for any discharge medications will be authorized once you are discharged, as it is imperative that you return to your primary care physician (or establish a relationship with a primary care physician if you do not have one) for your aftercare needs so that  they can reassess your need for medications and monitor your lab values.   Allergies as of 09/07/2018      Reactions   Vancomycin Itching, Rash      Medication List    STOP taking these medications   gabapentin 300 MG capsule Commonly known as:  NEURONTIN   HYDROcodone-acetaminophen 5-325 MG tablet Commonly known as:  NORCO/VICODIN   lisinopril 10 MG tablet Commonly known as:  PRINIVIL,ZESTRIL   metFORMIN 500 MG tablet Commonly known as:  GLUCOPHAGE   metolazone 2.5 MG tablet Commonly known as:  ZAROXOLYN   tiZANidine 4 MG tablet Commonly known as:  ZANAFLEX     TAKE these medications   acetaminophen 500 MG tablet Commonly known as:  TYLENOL Take 1 tablet by mouth as needed.   albuterol (2.5 MG/3ML) 0.083% nebulizer solution Commonly known as:  PROVENTIL Take 2.5 mg by nebulization every 6 (six) hours as needed. For shortness of breath   albuterol 108 (90 Base) MCG/ACT inhaler Commonly known as:  PROVENTIL HFA;VENTOLIN HFA Inhale 2 puffs into the lungs every 6 (six) hours as needed. For shortness of breath   carvedilol 3.125 MG tablet Commonly known as:  COREG Take 1 tablet (3.125 mg total) by mouth 2 (two) times daily with a meal. What changed:    medication strength  how much to take   ferrous sulfate 325 (65 FE) MG EC tablet Take 325 mg by mouth 2 (two) times daily.   furosemide 40 MG tablet Commonly known as:  LASIX Take 1 tablet (40 mg total) by mouth daily. What changed:  See the new instructions.   hydrocortisone 2.5 % rectal cream Commonly known as:  ANUSOL-HC Place 1 application rectally 2 (two) times daily. What changed:    when to take this  reasons to take this   insulin glargine 100 UNIT/ML injection Commonly known as:  LANTUS Inject 20 Units into the skin 2 (two) times daily.   magnesium oxide 400 MG tablet Commonly known as:  MAG-OX Take 400 mg by mouth 2 (two) times daily.   multivitamin tablet Take 1 tablet by mouth  daily.   nitroGLYCERIN 0.4 MG SL tablet Commonly known as:  NITROSTAT Place 1 tablet (0.4 mg total) under the tongue every 5 (five) minutes x 3 doses as needed. For chest pain   ondansetron 4 MG tablet Commonly known as:  ZOFRAN Take 4 mg by mouth 4 (four) times daily as needed for nausea or vomiting.   pantoprazole 40 MG tablet Commonly known as:  PROTONIX Take 1 tablet (40 mg total) by mouth daily before breakfast.   primidone 50 MG tablet Commonly known as:  MYSOLINE Take 50 mg by mouth daily.   RESTORA Caps Take 1 capsule by mouth daily.   sertraline 50 MG tablet Commonly known as:  ZOLOFT Take 50 mg by mouth daily.   simvastatin 80 MG tablet Commonly known as:  ZOCOR Take 40 mg by mouth at bedtime.   warfarin 4 MG tablet Commonly known as:  COUMADIN Take as directed. If you are unsure how to take this medication, talk to your nurse or doctor. Original instructions:  Take 2 tablets (8 mg total) by mouth daily at 6 PM. What changed:    how much to take  how to take this  when to take this  additional instructions            Durable Medical Equipment  (From admission, onward)         Start     Ordered   09/07/18 1648  For home use only DME Walker rolling  Once    Question:  Patient needs a walker to treat with the following condition  Answer:  Mobility impaired   09/07/18 1648   09/07/18 1648  For home use only DME 3 n 1  Once     09/07/18 1648         Allergies  Allergen Reactions  . Vancomycin Itching and Rash   Follow-up [facility] Follow up.   Why:  3:1 and rolling walker Contact information: [place] [address] [place], [place] Follow up.   Specialty:  Home Health Services Why:  physical and occupational therapy Contact information: [place] [address] [phone]        [name] Du, MD. Call in  1 day(s).   Specialty:  Pulmonary Disease Why:  Call for a post hospital follow-up appointment. Contact information: [place] [address] [phone]        Arnoldo Lenis, MD .   Specialty:  Cardiology Contact information: [place] [address] [phone]        Evans Lance, MD .   Specialty:  Cardiology Contact information: [place] [address] [phone]  The results of significant diagnostics from this hospitalization (including imaging, microbiology, ancillary and laboratory) are listed below for reference.    Significant Diagnostic Studies: Dg Chest 2 View  Result Date: 09/04/2018 CLINICAL DATA:  Weakness. EXAM: CHEST - 2 VIEW COMPARISON:  09/01/2018 FINDINGS: Left chest wall ICD is noted with leads in the right atrial appendage, and right ventricle. Mild cardiac enlargement. There is asymmetric elevation of the right hemidiaphragm. No airspace opacities identified. IMPRESSION: 1. No acute cardiopulmonary abnormalities. Electronically Signed   By: Kerby Moors M.D.   On: 09/04/2018 16:31   Dg Ribs Unilateral Left  Result Date: 09/01/2018 CLINICAL DATA:  Pain following fall EXAM: LEFT RIBS - 3 VIEW COMPARISON:  Chest radiograph March 25, 2017 FINDINGS: Frontal chest as well as oblique and cone-down rib images were obtained. There is no edema or consolidation. Heart size and pulmonary vascularity normal. Pacemaker leads are attached to the right atrium, right ventricle, and coronary sinus. No adenopathy. No demonstrable rib fracture.  No pneumothorax or pleural effusion. IMPRESSION: No demonstrable rib fracture. No edema or consolidation. Pacemaker leads unchanged. Electronically Signed   By: Lowella Grip III M.D.   On: 09/01/2018 10:13   Ct Head Wo Contrast  Result Date: 09/04/2018 CLINICAL DATA:  62 y/o M; generalized weakness and involuntary shaking of the right arm. Patient is fallen  several times over the past week. EXAM: CT HEAD WITHOUT CONTRAST TECHNIQUE: Contiguous axial images were obtained from the base of the skull through the vertex without intravenous contrast. COMPARISON:  01/27/2014 CT head FINDINGS: Brain: No evidence of acute infarction, hemorrhage, hydrocephalus, extra-axial collection or mass lesion/mass effect. Few stable nonspecific white matter hypodensities compatible with mild chronic microvascular ischemic changes. Stable mild volume loss of the brain. Vascular: Calcific atherosclerosis of carotid siphons and vertebral arteries. Skull: Normal. Negative for fracture or focal lesion. Sinuses/Orbits: No acute finding. Other: None. IMPRESSION: 1. No acute intracranial abnormality identified. 2. Stable mild chronic microvascular ischemic changes and mild volume loss of the brain. Electronically Signed   By: Kristine Garbe M.D.   On: 09/04/2018 16:45   US Renal  Result Date: 09/05/2018 CLINICAL DATA:  62 year old male with acute renal insufficiency and elevated creatinine. EXAM: RENAL / URINARY TRACT ULTRASOUND COMPLETE COMPARISON:  CT of the abdomen pelvis dated 11/25/2014 FINDINGS: Right Kidney: Renal measurements: 12.7 x 6.7 x 6.1 cm = volume: 270.5 mL. Mild increased echogenicity. No hydronephrosis or shadowing stone. Left Kidney: Renal measurements: 11.2 x 5.7 x 5.7 cm = volume: 186.7 mL. Mildly increased echogenicity. No hydronephrosis or shadowing stone. Bladder: The urinary bladder is decompressed around a Foley catheter. IMPRESSION: Mild increased renal echogenicity may represent medical renal disease. No hydronephrosis or shadowing stone. Electronically Signed   By: Anner Crete M.D.   On: 09/05/2018 04:43   Dg Chest Port 1 View  Result Date: 09/07/2018 CLINICAL DATA:  Hypoxia, dyspnea EXAM: PORTABLE CHEST 1 VIEW COMPARISON:  09/04/2018 chest radiograph. FINDINGS: Stable configuration of 3 lead left subclavian ICD. Stable cardiomediastinal  silhouette with mild cardiomegaly. No pneumothorax. Small right pleural effusion, slightly increased. No left pleural effusion. Cephalization of the pulmonary vasculature without overt pulmonary edema. Mild right lung base atelectasis. IMPRESSION: 1. Stable mild cardiomegaly without overt pulmonary edema. 2. Small right pleural effusion and right lung base atelectasis, mildly increased. Electronically Signed   By: Ilona Sorrel M.D.   On: 09/07/2018 09:34    Microbiology: Recent Results (from the past 240 hour(s))  MRSA PCR Screening  Status: None   Collection Time: 09/05/18  4:31 AM  Result Value Ref Range Status   MRSA by PCR NEGATIVE NEGATIVE Final    Comment:        The GeneXpert MRSA Assay (FDA approved for NASAL specimens only), is one component of a comprehensive MRSA colonization surveillance program. It is not intended to diagnose MRSA infection nor to guide or monitor treatment for MRSA infections. Performed at [facility], [place] [address], [place], [place] [address]   Culture, blood (routine x 2)     Status: None (Preliminary result)   Collection Time: 09/05/18  9:07 PM  Result Value Ref Range Status   Specimen Description BLOOD RIGHT ANTECUBITAL  Final   Special Requests   Final    BOTTLES DRAWN AEROBIC AND ANAEROBIC Blood Culture adequate volume   Culture   Final    NO GROWTH 2 DAYS Performed at [facility], [place] [address], [place], [place] [address]    Report Status PENDING  Incomplete  Culture, blood (routine x 2)     Status: None (Preliminary result)   Collection Time: 09/05/18  9:15 PM  Result Value Ref Range Status   Specimen Description BLOOD RIGHT HAND  Final   Special Requests   Final    BOTTLES DRAWN AEROBIC AND ANAEROBIC Blood Culture adequate volume   Culture   Final    NO GROWTH 2 DAYS Performed at [facility], [place] [address], [place], [place] [address]    Report Status PENDING  Incomplete     Labs: Basic Metabolic  Panel: Recent Labs  Lab 09/05/18 1211 09/05/18 1852 09/06/18 0009 09/06/18 0547 09/06/18 1440 09/07/18 0327  NA 131* 134* 133* 137 135 138  K 6.9* 6.4* 5.9* 5.2* 4.9 5.1  CL 111 111 109 109 104 107  CO2 14* 17* 20* 23 23 24   GLUCOSE 128* 155* 126* 124* 259* 131*  BUN 80* 79* 79* 71* 62* 54*  CREATININE 4.01* 3.82* 3.38* 3.05* 2.58* 2.06*  CALCIUM 7.9* 7.9* 7.6* 7.7* 7.5* 7.8*  PHOS 7.8* 7.3* 6.5* 6.1* 5.2*  --    Liver Function Tests: Recent Labs  Lab 09/04/18 1524 09/05/18 1211 09/05/18 1852 09/06/18 0009 09/06/18 0547 09/06/18 1440  AST 16  --   --   --   --   --   ALT 17  --   --   --   --   --   ALKPHOS 90  --   --   --   --   --   BILITOT 0.3  --   --   --   --   --   PROT 6.3*  --   --   --   --   --   ALBUMIN 3.1* 2.4* 2.3* 2.3* 2.2* 2.1*   Recent Labs  Lab 09/04/18 1524  LIPASE 105*   Recent Labs  Lab 09/04/18 1535  AMMONIA 18   CBC: Recent Labs  Lab 09/04/18 1524 09/05/18 0226 09/06/18 0009 09/07/18 0620  WBC 6.9 5.8 5.2 5.2  NEUTROABS 4.9  --   --   --   HGB 10.1* 8.6* 7.8* 8.5*  HCT 32.5* 27.9* 24.0* 26.2*  MCV 94.8 93.0 89.6 90.3  PLT 173 169 146* 160   Cardiac Enzymes: Recent Labs  Lab 09/04/18 1524  TROPONINI <0.03   BNP: BNP (last 3 results) Recent Labs    09/04/18 1517  BNP 605.0*    ProBNP (last 3 results)  No results for input(s): PROBNP in the last 8760 hours.  CBG: Recent Labs  Lab 09/07/18 0017 09/07/18 0408 09/07/18 0754 09/07/18 1116 09/07/18 1638  GLUCAP 140* 120* 153* 205* 110*       Signed:  Kayleen Memos, MD [facility] 09/07/2018, 5:41 PM

## 2018-09-07 NOTE — Progress Notes (Signed)
ANTICOAGULATION CONSULT NOTE - Initial Consult  Pharmacy Consult for Coumadin Indication: atrial fibrillation  Allergies  Allergen Reactions  . Vancomycin Itching and Rash    Patient Measurements: Height: 6' (182.9 cm) Weight: 174 lb 2.6 oz (79 kg) IBW/kg (Calculated) : 77.6  Vital Signs: Temp: 97.5 F (36.4 C) (12/05 0406) Temp Source: Oral (12/05 0406) BP: 133/56 (12/05 0406) Pulse Rate: 59 (12/05 0406)  Labs: Recent Labs    09/04/18 1524  09/05/18 0226  09/06/18 0009 09/06/18 0547 09/06/18 1440 09/07/18 0327  HGB 10.1*  --  8.6*  --  7.8*  --   --   --   HCT 32.5*  --  27.9*  --  24.0*  --   --   --   PLT 173  --  169  --  146*  --   --   --   LABPROT 18.9*  --   --   --   --   --   --   --   INR 1.61  --   --   --   --   --   --   --   CREATININE 4.77*   < >  --    < > 3.38* 3.05* 2.58* 2.06*  TROPONINI <0.03  --   --   --   --   --   --   --    < > = values in this interval not displayed.    Estimated Creatinine Clearance: 40.8 mL/min (A) (by C-G formula based on SCr of 2.06 mg/dL (H)).   Medical History: Past Medical History:  Diagnosis Date  . A-fib ([place])   . Anemia, normocytic normochromic 2008   2008  . Arteriosclerotic cardiovascular disease (ASCVD)    Acute MI in 2006->  LAD stent, EF-37%  . Arthritis    Hx: of in hands  . Automatic implantable cardioverter-defibrillator in situ   . Carpal tunnel syndrome   . Chronic systolic (congestive) heart failure (HCC)    a. EF 20-25% by echo in 03/2017  . Colon cancer ([place]) 2006   Partial colectomy in 2006  . Complete heart block ([place])    S/P ICD 02/09/13  . COPD (chronic obstructive pulmonary disease) ([place])   . Coronary artery disease   . Diabetes mellitus, type II ([place])    With peripheral neuropathy  . Edema    venous insufficiency  . H/O alcohol dependence ([place])   . H/O hiatal hernia   . Hyperlipidemia   . Hypertension   . Myocardial infarction [facility])    Hx: of 2006  . Neuropathy in diabetes  ([place])    Hx: of  . Pacemaker   . Pneumonia 01/2012   01/2012; subsequent admission for chest pain  . Shortness of breath    Hx: of   . Tobacco abuse     Medications:  Medications Prior to Admission  Medication Sig Dispense Refill Last Dose  . acetaminophen (TYLENOL) 500 MG tablet Take 1 tablet by mouth as needed.   unknown  . albuterol (PROVENTIL HFA;VENTOLIN HFA) 108 (90 BASE) MCG/ACT inhaler Inhale 2 puffs into the lungs every 6 (six) hours as needed. For shortness of breath   unknown  . albuterol (PROVENTIL) (2.5 MG/3ML) 0.083% nebulizer solution Take 2.5 mg by nebulization every 6 (six) hours as needed. For shortness of breath   unknown  . carvedilol (COREG) 6.25 MG tablet Take 6.25 mg by mouth 2 (two) times daily with a meal.  09/03/2018 at 1600  . ferrous sulfate 325 (65 FE) MG EC tablet Take 325 mg by mouth 2 (two) times daily.   Past Month at Unknown time  . furosemide (LASIX) 40 MG tablet TAKE 1 TABLET IN THE MORNING  AND TAKE 1/2 TABLET IN THE EVENING (Patient taking differently: Take 40 mg by mouth 2 (two) times daily. ) 135 tablet 3 09/03/2018 at Unknown time  . gabapentin (NEURONTIN) 300 MG capsule Take 300 mg by mouth at bedtime.   09/03/2018 at Unknown time  . HYDROcodone-acetaminophen (NORCO/VICODIN) 5-325 MG tablet Take 1 tablet by mouth every 6 (six) hours as needed.   09/02/2018 at Unknown time  . hydrocortisone (ANUSOL-HC) 2.5 % rectal cream Place 1 application rectally 2 (two) times daily. (Patient taking differently: Place 1 application rectally 2 (two) times daily as needed. ) 30 g 1 Past Week at Unknown time  . lisinopril (PRINIVIL,ZESTRIL) 10 MG tablet Take 1 tablet by mouth daily.   09/03/2018 at Unknown time  . magnesium oxide (MAG-OX) 400 MG tablet Take 400 mg by mouth 2 (two) times daily.   09/03/2018 at Unknown time  . metFORMIN (GLUCOPHAGE) 500 MG tablet Take 1,000 mg by mouth 2 (two) times daily with a meal.   09/03/2018 at Unknown time  . metolazone (ZAROXOLYN) 2.5  MG tablet TAKE 1 TABLET (2.5 MG) ON SUNDAY AND WEDNESDAY ONLY (Patient taking differently: Take 2.5 mg by mouth 2 (two) times a week. Sundays and Wednesdays only) 24 tablet 3 09/03/2018 at Unknown time  . Multiple Vitamin (MULTIVITAMIN) tablet Take 1 tablet by mouth daily.   09/03/2018 at Unknown time  . nitroGLYCERIN (NITROSTAT) 0.4 MG SL tablet Place 1 tablet (0.4 mg total) under the tongue every 5 (five) minutes x 3 doses as needed. For chest pain 25 tablet 3 09/02/2018 at Unknown time  . ondansetron (ZOFRAN) 4 MG tablet Take 4 mg by mouth 4 (four) times daily as needed for nausea or vomiting.   Past Month at Unknown time  . pantoprazole (PROTONIX) 40 MG tablet Take 1 tablet (40 mg total) by mouth daily before breakfast. 30 tablet 12 09/03/2018 at Unknown time  . primidone (MYSOLINE) 50 MG tablet Take 50 mg by mouth daily.    09/03/2018 at Unknown time  . Probiotic Product (RESTORA) CAPS Take 1 capsule by mouth daily.   09/03/2018 at Unknown time  . sertraline (ZOLOFT) 50 MG tablet Take 50 mg by mouth daily.   09/03/2018 at Unknown time  . simvastatin (ZOCOR) 80 MG tablet Take 40 mg by mouth at bedtime.   09/03/2018 at Unknown time  . tiZANidine (ZANAFLEX) 4 MG tablet Take 4 mg by mouth 3 (three) times daily.   09/03/2018 at Unknown time  . warfarin (COUMADIN) 4 MG tablet Take 3 tablets daily except 2 tablets on Sundays and Thursdays (Patient taking differently: Take 8-12 mg by mouth See admin instructions. Take 12mg   daily except 8mg  on Sundays and Thursdays) 270 tablet 4 09/03/2018 at 1600  . insulin glargine (LANTUS) 100 UNIT/ML injection Inject 20 Units into the skin 2 (two) times daily.    Not Taking at Unknown time   Scheduled:  . heparin injection (subcutaneous)  5,000 Units Subcutaneous Q8H  . insulin aspart  0-9 Units Subcutaneous Q4H  . sodium zirconium cyclosilicate  10 g Oral Once  . sodium zirconium cyclosilicate  10 g Oral Daily  . Warfarin - Pharmacist Dosing Inpatient   Does not apply  q1800    Assessment: 62yo  male admitted for generalized weakness and hand shaking w/ AKI on CKD, now cleared to resume Coumadin for Afib; currently on SQ heparin, last dose of Coumadin taken 12/1.  Goal of Therapy:  INR 2-3   Plan:  Will give boosted Coumadin dose of 12mg  x1 today (usual Thursday dose 8mg ) and monitor INR for dose adjustments.  Wynona Neat, PharmD, BCPS  09/07/2018,6:06 AM

## 2018-09-07 NOTE — Evaluation (Signed)
Occupational Therapy Evaluation Patient Details Name: [name] MRN: [id number] DOB: 1956/04/23 Today's Date: 09/07/2018    History of Present Illness 62 y.o.malewith past medical history significantfor COPD, CKD 3, HTN, CHF, paroxysmal atrial fibrillation on Coumadin,who presented to the ED with complaints of involuntary shaking of his right hand over the past few days associated with multiple falls.Pt also with recent diagnosis of C. difficile diarrhea11/11/19. Nephrology consulted and pt admitted for further workup.    Clinical Impression   This 62 y/o male presents with the above. At baseline pt reports he is typically independent with ADLs and functional mobility, as of a few days ago had started using walker for mobility and receiving some assist from spouse for ADLs due to decreased balance. Pt reports x4 falls within the past week. Pt performing functional mobility in room and short distance in hallway using RW with overall minA this session. Pt with one slight LOB to the R with mobility requiring minA to recover. Pt currently requires setup assist for seated UB ADL, minguard-minA for LB and toileting ADLs. He reports dizziness throughout session which does not increase with activity, VSS including BP and no apparent visual deficits noted. Pt lives with spouse who he reports is home and able to provide ADL assist PRN. Pt will benefit from continued acute OT services and recommend follow up [facility] services after discharge to maximize his safety and independence with ADLs and mobility. Will follow.     Follow Up Recommendations  Home health OT;Supervision/Assistance - 24 hour    Equipment Recommendations  3 in 1 bedside commode           Precautions / Restrictions Precautions Precautions: Fall Precaution Comments: reports x4 falls in the past week Restrictions Weight Bearing Restrictions: No      Mobility Bed Mobility Overal bed mobility: Needs Assistance Bed Mobility:  Supine to Sit     Supine to sit: Supervision     General bed mobility comments: for general safety, no physical assist required   Transfers Overall transfer level: Needs assistance Equipment used: Rolling walker (2 wheeled) Transfers: Sit to/from Stand Sit to Stand: Min assist;Min guard         General transfer comment: VCs safe hand placement; minA to rise and steady from EOB, minguard from [facility]    Balance Overall balance assessment: Needs assistance Sitting-balance support: Feet supported Sitting balance-Leahy Scale: Good     Standing balance support: Bilateral upper extremity supported;During functional activity;No upper extremity supported Standing balance-Leahy Scale: Poor Standing balance comment: overall reliant on UE support, leaning against sink for increased stability during grooming task at sink                           ADL either performed or assessed with clinical judgement   ADL Overall ADL's : Needs assistance/impaired Eating/Feeding: Modified independent;Sitting   Grooming: Wash/dry hands;Minimal assistance;Standing Grooming Details (indicate cue type and reason): minA standing balance Upper Body Bathing: Min guard;Sitting   Lower Body Bathing: Sit to/from stand;Min guard   Upper Body Dressing : Set up;Sitting   Lower Body Dressing: Min guard;Minimal assistance;Sit to/from stand   Toilet Transfer: Minimal assistance;Ambulation;BSC;RW Toilet Transfer Details (indicate cue type and reason): BSC in room Toileting- Clothing Manipulation and Hygiene: Minimal assistance;Sit to/from stand;Sitting/lateral lean Toileting - Clothing Manipulation Details (indicate cue type and reason): light minA for gown management; pt performing peri-care after attempted BM using lateral leans seated on [facility]   Tub/Shower  Transfer Details (indicate cue type and reason): educated on use of 3:1 as shower seat during bathing task for increased safety, pt verbalizing  understanding Functional mobility during ADLs: Minimal assistance;Rolling walker General ADL Comments: pt performing functional mobility in room and short distance in hallway using RW, pt with one slight LOB to the R, endorses feeling slightly off balance; pt also reports dizziness, does not increase with mobility, VSS      Vision Baseline Vision/History: Cataracts;Wears glasses Wears Glasses: At all times Patient Visual Report: No change from baseline(reports dizziness) Vision Assessment?: Yes Eye Alignment: Within Functional Limits Ocular Range of Motion: Within Functional Limits Alignment/Gaze Preference: Within Defined Limits Tracking/Visual Pursuits: Able to track stimulus in all quads without difficulty Saccades: Within functional limits Visual Fields: No apparent deficits     Perception     Praxis      Pertinent Vitals/Pain Pain Assessment: No/denies pain     Hand Dominance Right   Extremity/Trunk Assessment Upper Extremity Assessment Upper Extremity Assessment: RUE deficits/detail;LUE deficits/detail RUE Deficits / Details: pt with baseline contractures in digits and unable to fully extend (able to flex fully), reports due to baseline arthritis and does not interfere with daily tasks RUE Coordination: decreased fine motor LUE Deficits / Details: pt with baseline contractures in digits and unable to fully extend (able to flex fully), reports due to baseline arthritis and does not interfere with daily tasks LUE Coordination: decreased fine motor   Lower Extremity Assessment Lower Extremity Assessment: Defer to PT evaluation       Communication Communication Communication: No difficulties   Cognition Arousal/Alertness: Awake/alert Behavior During Therapy: WFL for tasks assessed/performed Overall Cognitive Status: Within Functional Limits for tasks assessed                                     General Comments  VSS during session    Exercises      Shoulder Instructions      Home Living Family/patient expects to be discharged to:: Private residence Living Arrangements: Spouse/significant other Available Help at Discharge: Family;Available 24 hours/day Type of Home: House Home Access: Level entry     Home Layout: Other (Comment)(one step down into kitchen/ one step up into bed/bathroom)     Bathroom Shower/Tub: Tub/shower unit   Bathroom Toilet: Standard     Home Equipment: Walker - 4 wheels;Grab bars - tub/shower;Grab bars - toilet          Prior Functioning/Environment Level of Independence: Independent with assistive device(s);Needs assistance  Gait / Transfers Assistance Needed: reports he started using walker a few days ago for increased stability ADL's / Homemaking Assistance Needed: independent up until a few days ago, then started needing some assist   Comments: pt reports x4 falls in the past week due to dizzines/LOB        OT Problem List: Decreased strength;Decreased activity tolerance;Impaired balance (sitting and/or standing);Decreased knowledge of use of DME or AE      OT Treatment/Interventions: Self-care/ADL training;Therapeutic exercise;DME and/or AE instruction;Therapeutic activities;Visual/perceptual remediation/compensation;Patient/family education;Balance training;Energy conservation    OT Goals(Current goals can be found in the care plan section) Acute Rehab OT Goals Patient Stated Goal: wants to go home OT Goal Formulation: With patient Time For Goal Achievement: 09/21/18 Potential to Achieve Goals: Good  OT Frequency: Min 2X/week   Barriers to D/C:            Co-evaluation  AM-PAC OT "6 Clicks" Daily Activity     Outcome Measure Help from another person eating meals?: None Help from another person taking care of personal grooming?: None Help from another person toileting, which includes using toliet, bedpan, or urinal?: A Little Help from another person bathing  (including washing, rinsing, drying)?: A Little Help from another person to put on and taking off regular upper body clothing?: None Help from another person to put on and taking off regular lower body clothing?: A Little 6 Click Score: 21   End of Session Equipment Utilized During Treatment: Gait belt;Rolling walker Nurse Communication: Mobility status  Activity Tolerance: Patient tolerated treatment well Patient left: with call bell/phone within reach;with bed alarm set;Other (comment)(seated EOB, RN aware)  OT Visit Diagnosis: Unsteadiness on feet (R26.81);Dizziness and giddiness (R42)                Time: 3710-6269 OT Time Calculation (min): 31 min Charges:  OT General Charges $OT Visit: 1 Visit OT Evaluation $OT Eval Moderate Complexity: 1 Mod OT Treatments $Self Care/Home Management : 8-22 mins  Lou Cal, OT Supplemental Rehabilitation Services Pager [phone] Office [phone]   Raymondo Band 09/07/2018, 2:31 PM

## 2018-09-07 NOTE — Care Management Note (Signed)
Case Management Note  Patient Details  Name: [name] MRN: [id number] Date of Birth: [birth year]  Subjective/Objective:     Pt admitted dwith hyperkalemia               Action/Plan:  PTA independent from home with wife.  Pt interested in [facility] PT and OT only, pt also interested in ordered DME.  CM provided Medicare.gov HH and DME list - pt chose [facility] for [facility] and [facility] for DME.  Pt has PCP and denied barriers with paying for medications   Expected Discharge Date:                  Expected Discharge Plan:  Home/Self Care  In-House Referral:     Discharge planning Services  CM Consult  Post Acute Care Choice:    Choice offered to:  Patient  DME Arranged:  3-N-1, Walker rolling DME Agency:   [facility] Arranged:  PT, OT HH Agency:  [facility]  Status of Service:  Completed, signed off  If discussed at [place] of Stay Meetings, dates discussed:    Additional Comments:  Maryclare Labrador, RN 09/07/2018, 4:53 PM

## 2018-09-07 NOTE — Evaluation (Addendum)
Physical Therapy Evaluation Patient Details Name: [name] MRN: [id number] DOB: [birth year] Today's Date: 09/07/2018   History of Present Illness  PT is a 62 y.o. Male admitted for weakness, falls and involuntary shaking of R hand over the past 3 weeks with hyperkalemia and AKI. PMH: COPD, CKD 3, HTN, CHF, pacemaker, paroxysmal atrial fibrillation on Coumadin. 08/14/18 recent dx of C. difficile diarrhea.   Clinical Impression  Pt admitted with above diagnosis. Pt presents with decreased functional LE strength and unsteadiness in walking consistent with a history of falls, limiting his ability to transfer and amb independently and safely. Pt amb 120 ft min guard with RW, HR range mid 80s bpm and SpO2 maintained at or above 92%. Pt educated on importance of use of RW to prevent future falls, pt agreeable. Noted inconsistencies in PLOF hx between OT note and PT session. Pt will benefit from skilled PT to increase his independence and safety with mobility to allow discharge to the home with [facility] PT listed.       Follow Up Recommendations Home health PT    Equipment Recommendations  Rolling walker with 5" wheels    Recommendations for Other Services       Precautions / Restrictions Precautions Precautions: Fall Precaution Comments: reports x4 falls in the past week Restrictions Weight Bearing Restrictions: No      Mobility  Bed Mobility Overal bed mobility: Needs Assistance Bed Mobility: Supine to Sit     Supine to sit: Supervision     General bed mobility comments: some help needed to navigate tangled sheets and lines/leads  Transfers Overall transfer level: Needs assistance Equipment used: Rolling walker (2 wheeled) Transfers: Sit to/from Stand Sit to Stand: Min guard         General transfer comment: VCs for safe use of RW and pushing from bed  Ambulation/Gait Ambulation/Gait assistance: Min guard   Assistive device: Rolling walker (2 wheeled) Gait  Pattern/deviations: Step-to pattern;Narrow base of support;Decreased stride length Gait velocity: decreased Gait velocity interpretation: 1.31 - 2.62 ft/sec, indicative of limited community ambulator General Gait Details: Occasional unsteadiness, puts significant weight through UE for RW support, states he feels more steady with RW compared to without. VCs to roll RW instead of lift. SpO2 maintained between 92-98%, HR ranged from 82-86bpm and NSR.   Stairs            Wheelchair Mobility    Modified Rankin (Stroke Patients Only)       Balance Overall balance assessment: Needs assistance Sitting-balance support: Feet supported;No upper extremity supported Sitting balance-Leahy Scale: Good     Standing balance support: Bilateral upper extremity supported;During functional activity Standing balance-Leahy Scale: Poor Standing balance comment: reliant on UE support during functional tasks                             Pertinent Vitals/Pain Pain Assessment: No/denies pain    Home Living Family/patient expects to be discharged to:: Private residence Living Arrangements: Spouse/significant other Available Help at Discharge: Family;Available 24 hours/day Type of Home: House Home Access: Level entry     Home Layout: Other (Comment) Home Equipment: Grab bars - toilet;Grab bars - tub/shower Additional Comments: Pt is unsure if his brother has a walker he can use. Pt has stairs at family member's houses he needs to be able to ascend/descend as that was where one fall took place.     Prior Function Level of Independence: Independent  Gait / Transfers Assistance Needed: mostly household amb, sometimes went out to grocery store with wife     Comments: pt reports x4 falls in the past week due to dizzines/LOB; thinks it may be related to low blood glucose. He states he feels "unsteady" and then falls. Pt hx telling inconsistencies noted from OT report.      Hand  Dominance   Dominant Hand: Right    Extremity/Trunk Assessment   Upper Extremity Assessment Upper Extremity Assessment: Defer to OT evaluation    Lower Extremity Assessment Lower Extremity Assessment: Overall WFL for tasks assessed       Communication   Communication: No difficulties  Cognition Arousal/Alertness: Awake/alert Behavior During Therapy: WFL for tasks assessed/performed Overall Cognitive Status: Within Functional Limits for tasks assessed                                 General Comments: Pt hx telling/PLOF discrepancies noted between PT session and OT note.       General Comments      Exercises     Assessment/Plan    PT Assessment Patient needs continued PT services  PT Problem List Decreased strength;Decreased activity tolerance;Decreased knowledge of use of DME;Decreased balance;Decreased mobility;Decreased safety awareness       PT Treatment Interventions DME instruction;Therapeutic activities;Balance training;Stair training;Gait training;Neuromuscular re-education;Functional mobility training;Patient/family education;Therapeutic exercise    PT Goals (Current goals can be found in the Care Plan section)  Acute Rehab PT Goals Patient Stated Goal: go home PT Goal Formulation: With patient Time For Goal Achievement: 09/21/18 Potential to Achieve Goals: Good    Frequency Min 3X/week   Barriers to discharge        Co-evaluation               AM-PAC PT "6 Clicks" Mobility  Outcome Measure Help needed turning from your back to your side while in a flat bed without using bedrails?: None Help needed moving from lying on your back to sitting on the side of a flat bed without using bedrails?: None Help needed moving to and from a bed to a chair (including a wheelchair)?: A Little Help needed standing up from a chair using your arms (e.g., wheelchair or bedside chair)?: A Little Help needed to walk in hospital room?: A Little Help  needed climbing 3-5 steps with a railing? : A Lot 6 Click Score: 19    End of Session Equipment Utilized During Treatment: Gait belt Activity Tolerance: Patient tolerated treatment well Patient left: in chair;with chair alarm set;with call bell/phone within reach Nurse Communication: Mobility status PT Visit Diagnosis: Unsteadiness on feet (R26.81);History of falling (Z91.81);Repeated falls (R29.6)    Time: 1410-1430 PT Time Calculation (min) (ACUTE ONLY): 20 min   Charges:   PT Evaluation $PT Eval Moderate Complexity: 1 [place], SPT Acute Rehab Services [place] 09/07/2018, 4:17 PM

## 2018-09-07 NOTE — Progress Notes (Signed)
[facility] Progress Note    Assessment/ Plan:   1. AKI on CKD 3: likely hypovolemia-mediated in setting of C diff and continued compliance with Lasix and ACEi. AKI resolving, pressures better.  Would restart Lasix 40 mg daily for now, can restart Coreg on d/c, and hold lisinopril for now--> will need renal function panel with PCP early next week.  Would give Lokelma today before d/c, don't think he'll need that on d/c as Lasix restarted and lisinopril held.  Educated pt on low K diet.  2.  Hyperkalemia: received hyperkalemia protocol in [facility] ED, K finally coming down with aggressive treatment of K.  Calcium, bicarb gtt as above, additional doses of Lokelma.  Of note, I do not recommend LR as this as 4 mEq K in it as well.    3.  CHF:  EF 20-25%.  Restarting meds as above  4.  Recent C diff- treated with Flagyl and probiotic, diarrhea improved.    5.  Shakiness/weakness: likely related to #1 and 2.  Expect to improve with treatment of the above.  Hold gabapentin.  Subjective:    Cr coming down, UOP good, K normalizing off bicarb gtt (has been off since yesterday afternoon).        Objective:   BP (!) 158/66 (BP Location: Left Arm)   Pulse 63   Temp 99.2 F (37.3 C) (Oral)   Resp 14   Ht 6' (1.829 m)   Wt 79 kg   SpO2 96%   BMI 23.62 kg/m   Intake/Output Summary (Last 24 hours) at 09/07/2018 1131 Last data filed at 09/07/2018 0800 Gross per 24 hour  Intake 822 ml  Output 400 ml  Net 422 ml   Weight change: -0.606 kg  Physical Exam: GEN older, chronically ill, NAD HEENT EOMI, PERRL NECK flat neck veins PULM bilateral coarse basilar BS CV RRR no m/r/g ABD soft EXT + sarcopenia, no LE edema NEURO AAO x 3, no myoclonus observed  Imaging: Dg Chest Port 1 View  Result Date: 09/07/2018 CLINICAL DATA:  Hypoxia, dyspnea EXAM: PORTABLE CHEST 1 VIEW COMPARISON:  09/04/2018 chest radiograph. FINDINGS: Stable configuration of 3 lead left subclavian ICD. Stable  cardiomediastinal silhouette with mild cardiomegaly. No pneumothorax. Small right pleural effusion, slightly increased. No left pleural effusion. Cephalization of the pulmonary vasculature without overt pulmonary edema. Mild right lung base atelectasis. IMPRESSION: 1. Stable mild cardiomegaly without overt pulmonary edema. 2. Small right pleural effusion and right lung base atelectasis, mildly increased. Electronically Signed   By: Ilona Sorrel M.D.   On: 09/07/2018 09:34    Labs: BMET Recent Labs  Lab 09/04/18 2019 09/05/18 1211 09/05/18 1852 09/06/18 0009 09/06/18 0547 09/06/18 1440 09/07/18 0327  NA 131* 131* 134* 133* 137 135 138  K 6.7* 6.9* 6.4* 5.9* 5.2* 4.9 5.1  CL 111 111 111 109 109 104 107  CO2 12* 14* 17* 20* 23 23 24   GLUCOSE 145* 128* 155* 126* 124* 259* 131*  BUN 85* 80* 79* 79* 71* 62* 54*  CREATININE 4.60* 4.01* 3.82* 3.38* 3.05* 2.58* 2.06*  CALCIUM 8.1* 7.9* 7.9* 7.6* 7.7* 7.5* 7.8*  PHOS  --  7.8* 7.3* 6.5* 6.1* 5.2*  --    CBC Recent Labs  Lab 09/04/18 1524 09/05/18 0226 09/06/18 0009 09/07/18 0620  WBC 6.9 5.8 5.2 5.2  NEUTROABS 4.9  --   --   --   HGB 10.1* 8.6* 7.8* 8.5*  HCT 32.5* 27.9* 24.0* 26.2*  MCV 94.8 93.0  89.6 90.3  PLT 173 169 146* 160    Medications:    . furosemide  40 mg Oral Daily  . heparin injection (subcutaneous)  5,000 Units Subcutaneous Q8H  . insulin aspart  0-9 Units Subcutaneous Q4H  . sodium zirconium cyclosilicate  10 g Oral Once  . sodium zirconium cyclosilicate  10 g Oral Daily  . warfarin  12 mg Oral ONCE-1800  . Warfarin - Pharmacist Dosing Inpatient   Does not apply q1800      Madelon Lips, MD 09/07/2018, 11:31 AM

## 2018-09-07 NOTE — Discharge Instructions (Signed)
Acute Kidney Injury, Adult Acute kidney injury is a sudden worsening of kidney function. The kidneys are organs that have several jobs. They filter the blood to remove waste products and extra fluid. They also maintain a healthy balance of minerals and hormones in the body, which helps control blood pressure and keep bones strong. With this condition, your kidneys do not do their jobs as well as they should. This condition ranges from mild to severe. Over time it may develop into long-lasting (chronic) kidney disease. Early detection and treatment may prevent acute kidney injury from developing into a chronic condition. What are the causes? Common causes of this condition include:  A problem with blood flow to the kidneys. This may be caused by: ? Low blood pressure (hypotension) or shock. ? Blood loss. ? Heart and blood vessel (cardiovascular) disease. ? Severe burns. ? Liver disease.  Direct damage to the kidneys. This may be caused by: ? Certain medicines. ? A kidney infection. ? Poisoning. ? Being around or in contact with toxic substances. ? A surgical wound. ? A hard, direct hit to the kidney area.  A sudden blockage of urine flow. This may be caused by: ? Cancer. ? Kidney stones. ? An enlarged prostate in males.  What are the signs or symptoms? Symptoms of this condition may not be obvious until the condition becomes severe. Symptoms of this condition can include:  Tiredness (lethargy), or difficulty staying awake.  Nausea or vomiting.  Swelling (edema) of the face, legs, ankles, or feet.  Problems with urination, such as: ? Abdominal pain, or pain along the side of your stomach (flank). ? Decreased urine production. ? Decrease in the force of urine flow.  Muscle twitches and cramps, especially in the legs.  Confusion or trouble concentrating.  Loss of appetite.  Fever.  How is this diagnosed? This condition may be diagnosed with tests, including:  Blood  tests.  Urine tests.  Imaging tests.  A test in which a sample of tissue is removed from the kidneys to be examined under a microscope (kidney biopsy).  How is this treated? Treatment for this condition depends on the cause and how severe the condition is. In mild cases, treatment may not be needed. The kidneys may heal on their own. In more severe cases, treatment will involve:  Treating the cause of the kidney injury. This may involve changing any medicines you are taking or adjusting your dosage.  Fluids. You may need specialized IV fluids to balance your body's needs.  Having a catheter placed to drain urine and prevent blockages.  Preventing problems from occurring. This may mean avoiding certain medicines or procedures that can cause further injury to the kidneys.  In some cases treatment may also require:  A procedure to remove toxic wastes from the body (dialysis or continuous renal replacement therapy - CRRT).  Surgery. This may be done to repair a torn kidney, or to remove the blockage from the urinary system.  Follow these instructions at home: Medicines  Take over-the-counter and prescription medicines only as told by your health care provider.  Do not take any new medicines without your health care provider's approval. Many medicines can worsen your kidney damage.  Do not take any vitamin and mineral supplements without your health care provider's approval. Many nutritional supplements can worsen your kidney damage. Lifestyle  If your health care provider prescribed changes to your diet, follow them. You may need to decrease the amount of protein you eat.  Achieve  and maintain a healthy weight. If you need help with this, ask your health care provider.  Start or continue an exercise plan. Try to exercise at least 30 minutes a day, 5 days a week.  Do not use any tobacco products, such as cigarettes, chewing tobacco, and e-cigarettes. If you need help quitting, ask  your health care provider. General instructions  Keep track of your blood pressure. Report changes in your blood pressure as told by your health care provider.  Stay up to date with immunizations. Ask your health care provider which immunizations you need.  Keep all follow-up visits as told by your health care provider. This is important. Where to find more information:  American Association of Kidney Patients: BombTimer.gl  National Kidney Foundation: www.kidney.[place]: https://mathis.com/  [facility]: ? www.lifeoptions.org ? www.kidneyschool.org Contact a health care provider if:  Your symptoms get worse.  You develop new symptoms. Get help right away if:  You develop symptoms of worsening kidney disease, which include: ? Headaches. ? Abnormally dark or light skin. ? Easy bruising. ? Frequent hiccups. ? Chest pain. ? Shortness of breath. ? End of menstruation in women. ? Seizures. ? Confusion or altered mental status. ? Abdominal or back pain. ? Itchiness.  You have a fever.  Your body is producing less urine.  You have pain or bleeding when you urinate. Summary  Acute kidney injury is a sudden worsening of kidney function.  Acute kidney injury can be caused by problems with blood flow to the kidneys, direct damage to the kidneys, and sudden blockage of urine flow.  Symptoms of this condition may not be obvious until it becomes severe. Symptoms may include edema, lethargy, confusion, nausea or vomiting, and problems passing urine.  This condition can usually be diagnosed with blood tests, urine tests, and imaging tests. Sometimes a kidney biopsy is done to diagnose this condition.  Treatment for this condition often involves treating the underlying cause. It is treated with fluids, medicines, dialysis, diet changes, or surgery. This information is not intended to replace advice given to you by your health care provider.  Make sure you discuss any questions you have with your health care provider. Document Released: 04/05/2011 Document Revised: 01/20/2017 Document Reviewed: 09/10/2016 Elsevier Interactive Patient Education  2018 [place].   Chronic Kidney Disease, Adult Chronic kidney disease (CKD) happens when the kidneys are damaged during a time of 3 or more months. The kidneys are two organs that do many important jobs in the body. These jobs include:  Removing wastes and extra fluids from the blood.  Making hormones that maintain the amount of fluid in your tissues and blood vessels.  Making sure that the body has the right amount of fluids and chemicals.  Most of the time, this condition does not go away, but it can usually be controlled. Steps must be taken to slow down the kidney damage or stop it from getting worse. Otherwise, the kidneys may stop working. Follow these instructions at home:  Follow your diet as told by your doctor. You may need to avoid alcohol, salty foods (sodium), and foods that are high in potassium, calcium, and protein.  Take over-the-counter and prescription medicines only as told by your doctor. Do not take any new medicines unless your doctor says you can do that. These include vitamins and minerals. ? Medicines and nutritional supplements can make kidney damage worse. ? Your doctor may need to change how much medicine you take.  Do not use any tobacco products. These include cigarettes, chewing tobacco, and e-cigarettes. If you need help quitting, ask your doctor.  Keep all follow-up visits as told by your doctor. This is important.  Check your blood pressure. Tell your doctor if there are changes to your blood pressure.  Get to a healthy weight. Stay at that weight. If you need help with this, ask your doctor.  Start or continue an exercise plan. Try to exercise at least 30 minutes a day, 5 days a week.  Stay up-to-date with your shots (immunizations) as told  by your doctor. Contact a doctor if:  Your symptoms get worse.  You have new symptoms. Get help right away if:  You have symptoms of end-stage kidney disease. These include: ? Headaches. ? Skin that is darker or lighter than normal. ? Numbness in your hands or feet. ? Easy bruising. ? Having hiccups often. ? Chest pain. ? Shortness of breath. ? Stopping of menstrual periods in women.  You have a fever.  You are making very little pee (urine).  You have pain or bleeding when you pee (urinate). This information is not intended to replace advice given to you by your health care provider. Make sure you discuss any questions you have with your health care provider. Document Released: 12/15/2009 Document Revised: 02/26/2016 Document Reviewed: 05/19/2012 Elsevier Interactive Patient Education  2017 [place].  Hyperkalemia Hyperkalemia is when you have too much potassium in your blood. Potassium is normally removed (excreted) from your body by your kidneys. If there is too much potassium in your blood, it can affect your hearts ability to function. What are the causes? Hyperkalemia may be caused by:  Taking in too much potassium. You can do this by: ? Using salt substitutes. They contain large amounts of potassium. ? Taking potassium supplements. ? Eating foods high in potassium.  Excreting too little potassium. This can happen if: ? Your kidneys are not working properly. Kidney (renal) disease, including short- or long-term renal failure, is a very common cause of hyperkalemia. ? You are taking medicines that lower your excretion of potassium. ? You have Addison disease. ? You have a urinary tract blockage, such as kidney stones. ? You are on treatment to mechanically clean your blood (dialysis) and you skip a treatment.  Releasing a high amount of potassium from your cells into your blood. This can happen with: ? Injury to muscles (rhabdomyolysis) or other tissues. Most  potassium is stored in your muscles. ? Severe burns or infections. ? Acidic blood plasma (acidosis). Acidosis can result from many diseases, such as uncontrolled diabetes.  What increases the risk? The most common risk factor of hyperkalemia is kidney disease. Other risk factors of hyperkalemia include:  Addison disease. This is a condition where your glands do not produce enough hormones.  Alcoholism or heavy drug use.  Using certain blood pressure medicines, such as angiotensin-converting enzyme (ACE) inhibitors, angiotensin II receptor blockers (ARBs), or potassium-sparing diuretics such as spironolactone.  Severe injury or burn.  What are the signs or symptoms? Oftentimes, there are no signs or symptoms of hyperkalemia. However, when your potassium level becomes high enough, you may experience symptoms such as:  Irregular or very slow heartbeat.  Nausea.  Fatigue.  Tingling of the skin or numbness of the hands or feet.  Muscle weakness.  Fatigue.  Not being able to move (paralysis).  You may not have any symptoms of hyperkalemia. How is this diagnosed? Hyperkalemia may be diagnosed by:  Physical exam.  Blood tests.  ECG (electrocardiogram).  Discussion of prescription and non-prescription drug use.  How is this treated? Treatment for hyperkalemia is often directed at the underlying cause. In some instances, treatment may include:  Insulin.  Glucose (sugar) and water solution given through a vein (intravenous or IV).  Dialysis.  Medicines to remove the potassium from your body.  Medicines to move calcium from your bloodstream into your tissues.  Follow these instructions at home:  Take medicines only as directed by your health care provider.  Do not take any supplements, natural products, herbs, or vitamins without reviewing them with your health care provider. Certain supplements and natural food products can have high amounts of potassium.  Limit  your alcohol intake as directed by your health care provider.  Stop illegal drug use. If you need help quitting, ask your health care provider.  Keep all follow-up visits as directed by your health care provider. This is important.  If you have kidney disease, you may need to follow a low potassium diet. A dietitian can help educate you on low potassium foods. Contact a health care provider if:  You notice an irregular or very slow heartbeat.  You feel light-headed.  You feel weak.  You are nauseous.  You have tingling or numbness in your hands or feet. Get help right away if:  You have shortness of breath.  You have chest pain or discomfort.  You pass out.  You have muscle paralysis. This information is not intended to replace advice given to you by your health care provider. Make sure you discuss any questions you have with your health care provider. Document Released: 09/10/2002 Document Revised: 02/26/2016 Document Reviewed: 12/26/2013 Elsevier Interactive Patient Education  2018 [employer].   Hyperkalemia Hyperkalemia is when you have too much potassium in your blood. Potassium is normally removed (excreted) from your body by your kidneys. If there is too much potassium in your blood, it can affect how your heart works. Follow these instructions at home:  Take medicines only as told by your doctor.  Do not take any supplements, natural products, herbs, or vitamins unless your doctor says it is okay.  Limit your alcohol intake as told by your doctor.  Stop illegal drug use. If you need help quitting, ask your doctor.  Keep all follow-up visits as told by your doctor. This is important.  If you have kidney disease, you may need to follow a low potassium diet. A food specialist (dietitian) can help you. Contact a doctor if:  Your heartbeat is not regular or very slow.  You feel dizzy (light-headed).  You feel weak.  You feel sick to your stomach  (nauseous).  You have tingling in your hands or feet.  You cannot feel your hands or feet. Get help right away if:  You are short of breath.  You have chest pain.  You pass out (faint).  You cannot move your muscles. This information is not intended to replace advice given to you by your health care provider. Make sure you discuss any questions you have with your health care provider. Document Released: 09/20/2005 Document Revised: 02/26/2016 Document Reviewed: 12/26/2013 Elsevier Interactive Patient Education  2018 [employer].

## 2018-09-08 ENCOUNTER — Ambulatory Visit (INDEPENDENT_AMBULATORY_CARE_PROVIDER_SITE_OTHER): Payer: Medicare HMO | Admitting: *Deleted

## 2018-09-08 DIAGNOSIS — Z5181 Encounter for therapeutic drug level monitoring: Secondary | ICD-10-CM | POA: Diagnosis not present

## 2018-09-08 DIAGNOSIS — I4891 Unspecified atrial fibrillation: Secondary | ICD-10-CM | POA: Diagnosis not present

## 2018-09-08 LAB — GLUCOSE, CAPILLARY
GLUCOSE-CAPILLARY: 113 mg/dL — AB (ref 70–99)
Glucose-Capillary: 129 mg/dL — ABNORMAL HIGH (ref 70–99)
Glucose-Capillary: 190 mg/dL — ABNORMAL HIGH (ref 70–99)

## 2018-09-08 LAB — POCT INR: INR: 1.2 — AB (ref 2.0–3.0)

## 2018-09-08 NOTE — Patient Instructions (Signed)
Restart coumadin 3 tablets daily except 2 tablets on Sundays and Thursdays  Continue greens Recheck on 09/11/18

## 2018-09-10 LAB — CULTURE, BLOOD (ROUTINE X 2)
Culture: NO GROWTH
Culture: NO GROWTH
Special Requests: ADEQUATE
Special Requests: ADEQUATE

## 2018-09-11 ENCOUNTER — Ambulatory Visit (INDEPENDENT_AMBULATORY_CARE_PROVIDER_SITE_OTHER): Payer: Medicare HMO | Admitting: *Deleted

## 2018-09-11 DIAGNOSIS — N179 Acute kidney failure, unspecified: Secondary | ICD-10-CM | POA: Diagnosis not present

## 2018-09-11 DIAGNOSIS — J449 Chronic obstructive pulmonary disease, unspecified: Secondary | ICD-10-CM | POA: Diagnosis not present

## 2018-09-11 DIAGNOSIS — I4891 Unspecified atrial fibrillation: Secondary | ICD-10-CM

## 2018-09-11 DIAGNOSIS — E875 Hyperkalemia: Secondary | ICD-10-CM | POA: Diagnosis not present

## 2018-09-11 DIAGNOSIS — Z5181 Encounter for therapeutic drug level monitoring: Secondary | ICD-10-CM | POA: Diagnosis not present

## 2018-09-11 DIAGNOSIS — I5042 Chronic combined systolic (congestive) and diastolic (congestive) heart failure: Secondary | ICD-10-CM | POA: Diagnosis not present

## 2018-09-11 LAB — POCT INR: INR: 1.4 — AB (ref 2.0–3.0)

## 2018-09-11 NOTE — Patient Instructions (Signed)
Take coumadin 4 tablets tonight and tomorrow night then resume 3 tablets daily except 2 tablets on Sundays and Thursdays  Continue greens Recheck on 09/18/18

## 2018-09-12 DIAGNOSIS — J449 Chronic obstructive pulmonary disease, unspecified: Secondary | ICD-10-CM | POA: Diagnosis not present

## 2018-09-12 DIAGNOSIS — N179 Acute kidney failure, unspecified: Secondary | ICD-10-CM | POA: Diagnosis not present

## 2018-09-12 DIAGNOSIS — E875 Hyperkalemia: Secondary | ICD-10-CM | POA: Diagnosis not present

## 2018-09-12 DIAGNOSIS — I5042 Chronic combined systolic (congestive) and diastolic (congestive) heart failure: Secondary | ICD-10-CM | POA: Diagnosis not present

## 2018-09-18 ENCOUNTER — Ambulatory Visit (INDEPENDENT_AMBULATORY_CARE_PROVIDER_SITE_OTHER): Payer: Medicare HMO | Admitting: *Deleted

## 2018-09-18 ENCOUNTER — Ambulatory Visit (INDEPENDENT_AMBULATORY_CARE_PROVIDER_SITE_OTHER): Payer: Medicare HMO

## 2018-09-18 ENCOUNTER — Telehealth: Payer: Self-pay

## 2018-09-18 DIAGNOSIS — I255 Ischemic cardiomyopathy: Secondary | ICD-10-CM | POA: Diagnosis not present

## 2018-09-18 DIAGNOSIS — Z5181 Encounter for therapeutic drug level monitoring: Secondary | ICD-10-CM

## 2018-09-18 DIAGNOSIS — I4891 Unspecified atrial fibrillation: Secondary | ICD-10-CM

## 2018-09-18 LAB — POCT INR: INR: 1.5 — AB (ref 2.0–3.0)

## 2018-09-18 MED ORDER — WARFARIN SODIUM 4 MG PO TABS: ORAL_TABLET | ORAL | 3 refills | Status: DC

## 2018-09-18 NOTE — Telephone Encounter (Signed)
Spoke with pt and reminded pt of remote transmission that is due today. Pt verbalized understanding.   

## 2018-09-18 NOTE — Patient Instructions (Signed)
Take coumadin 4 tablets x 4 days 9Mon - Thurs) then increase dose to 3 tablets daily  Continue greens Recheck on 09/25/18

## 2018-09-20 ENCOUNTER — Encounter: Payer: Self-pay | Admitting: Cardiology

## 2018-09-20 NOTE — Progress Notes (Signed)
Remote ICD transmission.   

## 2018-09-25 ENCOUNTER — Ambulatory Visit (INDEPENDENT_AMBULATORY_CARE_PROVIDER_SITE_OTHER): Payer: Medicare HMO | Admitting: *Deleted

## 2018-09-25 DIAGNOSIS — N179 Acute kidney failure, unspecified: Secondary | ICD-10-CM | POA: Diagnosis not present

## 2018-09-25 DIAGNOSIS — Z5181 Encounter for therapeutic drug level monitoring: Secondary | ICD-10-CM

## 2018-09-25 DIAGNOSIS — I25119 Atherosclerotic heart disease of native coronary artery with unspecified angina pectoris: Secondary | ICD-10-CM | POA: Diagnosis not present

## 2018-09-25 DIAGNOSIS — I4891 Unspecified atrial fibrillation: Secondary | ICD-10-CM | POA: Diagnosis not present

## 2018-09-25 DIAGNOSIS — I5042 Chronic combined systolic (congestive) and diastolic (congestive) heart failure: Secondary | ICD-10-CM | POA: Diagnosis not present

## 2018-09-25 DIAGNOSIS — J449 Chronic obstructive pulmonary disease, unspecified: Secondary | ICD-10-CM | POA: Diagnosis not present

## 2018-09-25 LAB — POCT INR: INR: 2.4 (ref 2.0–3.0)

## 2018-09-25 NOTE — Patient Instructions (Signed)
Continue coumadin 3 tablets daily Continue greens Recheck in 2 weeks

## 2018-09-28 DIAGNOSIS — J449 Chronic obstructive pulmonary disease, unspecified: Secondary | ICD-10-CM | POA: Diagnosis not present

## 2018-09-28 DIAGNOSIS — I25119 Atherosclerotic heart disease of native coronary artery with unspecified angina pectoris: Secondary | ICD-10-CM | POA: Diagnosis not present

## 2018-09-28 DIAGNOSIS — I5042 Chronic combined systolic (congestive) and diastolic (congestive) heart failure: Secondary | ICD-10-CM | POA: Diagnosis not present

## 2018-09-28 DIAGNOSIS — N179 Acute kidney failure, unspecified: Secondary | ICD-10-CM | POA: Diagnosis not present

## 2018-10-02 ENCOUNTER — Ambulatory Visit (INDEPENDENT_AMBULATORY_CARE_PROVIDER_SITE_OTHER): Payer: Medicare HMO | Admitting: Gastroenterology

## 2018-10-02 ENCOUNTER — Encounter: Payer: Self-pay | Admitting: Gastroenterology

## 2018-10-02 VITALS — BP 160/78 | HR 66 | Temp 98.4°F | Ht 72.0 in | Wt 173.0 lb

## 2018-10-02 DIAGNOSIS — K2270 Barrett's esophagus without dysplasia: Secondary | ICD-10-CM | POA: Diagnosis not present

## 2018-10-02 DIAGNOSIS — Z85038 Personal history of other malignant neoplasm of large intestine: Secondary | ICD-10-CM

## 2018-10-02 DIAGNOSIS — Z8619 Personal history of other infectious and parasitic diseases: Secondary | ICD-10-CM | POA: Diagnosis not present

## 2018-10-02 NOTE — Patient Instructions (Signed)
Continue Protonix once each morning, 30 minutes before breakfast. This is for reflux. It is important you continue this due to the history of Barrett's esophagus.  If you have further diarrhea, please call us. You can continue probiotics daily.  We will see you in February 2020 to arrange the upper endoscopy to follow-up on any changes in your esophagus from last time.  Have a Happy New Year!  I enjoyed seeing you again today! As you know, I value our relationship and want to provide genuine, compassionate, and quality care. I welcome your feedback. If you receive a survey regarding your visit,  I greatly appreciate you taking time to fill this out. See you next time!  Annitta Needs, PhD, ANP-BC [facility]

## 2018-10-02 NOTE — Assessment & Plan Note (Signed)
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 No alarm features. Continue PPI daily. Benefits of continuing PPI in setting of recent CDI far outweigh the risks of possible recurrence in this setting. Return in Feb 2020 to arrange.

## 2018-10-02 NOTE — Progress Notes (Signed)
Referring Provider: Sinda Du, MD Primary Care Physician:  Sinda Du, MD Primary GI: Dr. Gala Romney   Chief Complaint  Patient presents with  . C. Diff    doing ok    HPI:   [name] is a 62 y.o. male presenting today with a history of CDI noted in Nov 2019 and treated with metronidazole as he was allergic to recommended first-line treatment vanc. Also notable history of colon cancer in 2006 s/p subtotal colectomy. He presented to [facility] June 2018 with Hgb 7.8, iron studies consistent with IDA. Heme positive. On chronic Coumadin. He underwent flex sig and EGD at time of inpatient stay. Flex sig with erosion at anastomosis s/p APC therapy, hyperplastic polyp. Will need surveillance in 2021. EGD with Barrett's esophagus and focal glandular atypia in 2018, due for surveillance now.   In interim from last visit, he was admitted for AKI on CKD in setting of dehydration. Improved with supportive measures. Taking a probiotic. Diarrhea resolved. Much improved. No abdominal pain unless he eats "too much". No rectal bleeding. Feels better. Wants to hold off on EGD until Feb 2020 due to scheduling issues.   Past Medical History:  Diagnosis Date  . A-fib ([place])   . Anemia, normocytic normochromic 2008   2008  . Arteriosclerotic cardiovascular disease (ASCVD)    Acute MI in 2006->  LAD stent, EF-37%  . Arthritis    Hx: of in hands  . Automatic implantable cardioverter-defibrillator in situ   . Carpal tunnel syndrome   . Chronic systolic (congestive) heart failure (HCC)    a. EF 20-25% by echo in 03/2017  . Colon cancer ([place]) 2006   Partial colectomy in 2006  . Complete heart block ([place])    S/P ICD 02/09/13  . COPD (chronic obstructive pulmonary disease) ([place])   . Coronary artery disease   . Diabetes mellitus, type II ([place])    With peripheral neuropathy  . Edema    venous insufficiency  . H/O alcohol dependence ([place])   . H/O hiatal hernia   . Hyperlipidemia   .  Hypertension   . Myocardial infarction [facility])    Hx: of 2006  . Neuropathy in diabetes ([place])    Hx: of  . Pacemaker   . Pneumonia 01/2012   01/2012; subsequent admission for chest pain  . Shortness of breath    Hx: of   . Tobacco abuse     Past Surgical History:  Procedure Laterality Date  . ANKLE SURGERY     Right  . Biventricular ICD Placement  02/09/2013   Dr. Lovena Le  . COLONOSCOPY  08/17/2005   Pancolonic diverticula/Multiple rectal polyps removed as described above. The larger of the two likely responsible for intermittent hematochezia/ Multiple polyps at the hepatic flexure resected with a snare./ Large polypoid lesion growing out of the base of the cecum, just behind the  ileocecal valve. This was not felt to be amendable to endoscopic resection. Biopsied multiple times  . CORONARY ANGIOPLASTY WITH STENT PLACEMENT    . ESOPHAGOGASTRODUODENOSCOPY N/A 03/29/2017   Barrett's esophagus, gastritis, duodenitis, focal glandular atypia on biopsy. Needs surveillance June 2019   . FLEXIBLE SIGMOIDOSCOPY    06/13/2006   Essentially normal residual rectum status post total colectomy. anastomosis at 25cm.  Focal area of abnormality adjacent to suture, as described above, likely not significant, suspect granulation tissue, biopsied.  Anastomosis otherwise appeared normal.  Distal terminal ileum appeared normal  . FLEXIBLE SIGMOIDOSCOPY N/A 01/13/2015  Dr. Gala Romney: normal-appearing residual rectum, surgical anastomosis, s/p subtotal colectomy. Surveillance 5 years   . FLEXIBLE SIGMOIDOSCOPY N/A 03/29/2017   Dr. Oneida Alar while inpatient: intermittent rectal bleeding due to ischemic erosion at anastomosis,s/p APC therapy. one 3 mm polyp in recum (hyperplastic). surveillance in 3 years  . INGUINAL HERNIA REPAIR    . MULTIPLE EXTRACTIONS WITH ALVEOLOPLASTY N/A 04/09/2013   Procedure: Extraction of tooth #'s 1,2,4,5,6,7,8,9,10,11,12,13,17,18,20,21,22,23,27,28, 29,30,31, and 32 with alveoloplasty.;  Surgeon:  Lenn Cal, DDS;  Location: [place];  Service: Oral Surgery;  Laterality: N/A;  . PERMANENT PACEMAKER INSERTION N/A 02/09/2013   Procedure: PERMANENT PACEMAKER INSERTION;  Surgeon: Evans Lance, MD;  Location: [facility] CATH LAB;  Service: Cardiovascular;  Laterality: N/A;  . SUBTOTAL COLECTOMY  09/01/2005   subtotal colectomy  . TEMPORARY PACEMAKER INSERTION Right 02/09/2013   Procedure: TEMPORARY PACEMAKER INSERTION;  Surgeon: Sinclair Grooms, MD;  Location: [facility] CATH LAB;  Service: Cardiovascular;  Laterality: Right;    Current Outpatient Medications  Medication Sig Dispense Refill  . acetaminophen (TYLENOL) 500 MG tablet Take 1 tablet by mouth as needed.    [name] albuterol (PROVENTIL HFA;VENTOLIN HFA) 108 (90 BASE) MCG/ACT inhaler Inhale 2 puffs into the lungs every 6 (six) hours as needed. For shortness of breath    . albuterol (PROVENTIL) (2.5 MG/3ML) 0.083% nebulizer solution Take 2.5 mg by nebulization every 6 (six) hours as needed. For shortness of breath    . carvedilol (COREG) 3.125 MG tablet Take 1 tablet (3.125 mg total) by mouth 2 (two) times daily with a meal. 60 tablet 0  . ferrous sulfate 325 (65 FE) MG EC tablet Take 325 mg by mouth 2 (two) times daily.    . furosemide (LASIX) 40 MG tablet Take 1 tablet (40 mg total) by mouth daily. (Patient taking differently: Take 40-60 mg by mouth 2 (two) times daily. Takes 1 tablet in am and 0.5 tablet at night) 30 tablet 0  . insulin glargine (LANTUS) 100 UNIT/ML injection Inject 20 Units into the skin 2 (two) times daily.     . magnesium oxide (MAG-OX) 400 MG tablet Take 400 mg by mouth 2 (two) times daily.    . Multiple Vitamin (MULTIVITAMIN) tablet Take 1 tablet by mouth daily.    . nitroGLYCERIN (NITROSTAT) 0.4 MG SL tablet Place 1 tablet (0.4 mg total) under the tongue every 5 (five) minutes x 3 doses as needed. For chest pain 25 tablet 3  . ondansetron (ZOFRAN) 4 MG tablet Take 4 mg by mouth 4 (four) times daily as needed for nausea or  vomiting.    . pantoprazole (PROTONIX) 40 MG tablet Take 1 tablet (40 mg total) by mouth daily before breakfast. 30 tablet 12  . primidone (MYSOLINE) 50 MG tablet Take 50 mg by mouth daily.     . Probiotic Product (RESTORA) CAPS Take 1 capsule by mouth daily.    . sertraline (ZOLOFT) 50 MG tablet Take 50 mg by mouth daily.    . simvastatin (ZOCOR) 80 MG tablet Take 40 mg by mouth at bedtime.    [name] warfarin (COUMADIN) 4 MG tablet Take 3 tablets daily or as directed 260 tablet 3   No current facility-administered medications for this visit.     Allergies as of 10/02/2018 - Review Complete 10/02/2018  Allergen Reaction Noted  . Vancomycin Itching and Rash 01/15/2012    Family History  Problem Relation Age of Onset  . Colon cancer Mother        Blanch Media  Mantel, ?>age 37.  [name] Heart disease Mother   . Heart disease Father   . Hypertension Brother   . Heart disease Brother   . Diabetes Brother   . Liver disease Neg Hx     Social History   Socioeconomic History  . Marital status: Married    Spouse name: Not on file  . Number of children: 1  . Years of education: Not on file  . Highest education level: Not on file  Occupational History  . Occupation: Writer    Comment: Disabled  Social Needs  . Financial resource strain: Not on file  . Food insecurity:    Worry: Not on file    Inability: Not on file  . Transportation needs:    Medical: Not on file    Non-medical: Not on file  Tobacco Use  . Smoking status: Current Every Day Smoker    Packs/day: 1.00    Years: 40.00    Pack years: 40.00    Types: Cigarettes    Start date: 07/13/1971  . Smokeless tobacco: Never Used  Substance and Sexual Activity  . Alcohol use: No    Alcohol/week: 0.0 standard drinks  . Drug use: No  . Sexual activity: Never  Lifestyle  . Physical activity:    Days per week: Not on file    Minutes per session: Not on file  . Stress: Not on file  Relationships  . Social connections:    Talks  on phone: Not on file    Gets together: Not on file    Attends religious service: Not on file    Active member of club or organization: Not on file    Attends meetings of clubs or organizations: Not on file    Relationship status: Not on file  Other Topics Concern  . Not on file  Social History Narrative  . Not on file    Review of Systems: Gen: Denies fever, chills, anorexia. Denies fatigue, weakness, weight loss.  CV: Denies chest pain, palpitations, syncope, peripheral edema, and claudication. Resp: Denies dyspnea at rest, cough, wheezing, coughing up blood, and pleurisy. GI: see HPI  Derm: Denies rash, itching, dry skin Psych: Denies depression, anxiety, memory loss, confusion. No homicidal or suicidal ideation.  Heme: Denies bruising, bleeding, and enlarged lymph nodes.  Physical Exam: BP (!) 160/78   Pulse 66   Temp 98.4 F (36.9 C) (Oral)   Ht 6' (1.829 m)   Wt 173 lb (78.5 kg)   BMI 23.46 kg/m  General:   Alert and oriented. No distress noted. Pleasant and cooperative.  Head:  Normocephalic and atraumatic. Eyes:  Conjuctiva clear without scleral icterus. Mouth:  Oral mucosa pink and moist.  Abdomen:  +BS, soft, non-tender and non-distended. No rebound or guarding. No HSM or masses noted. Msk:  Symmetrical without gross deformities. Normal posture. Extremities:  Without edema. Neurologic:  Alert and  oriented x4 Psych:  Alert and cooperative. Normal mood and affect.

## 2018-10-02 NOTE — Assessment & Plan Note (Signed)
Diagnosed in 2006 s/p subtotal colectomy. Flex sig completed June 2018, and surveillance is due in 2021. No concerning signs/symptoms.

## 2018-10-02 NOTE — Progress Notes (Signed)
CC'D TO PCP °

## 2018-10-02 NOTE — Assessment & Plan Note (Signed)
Nov 2019. Allergic to vancomycin, so he was treated with metronidazole. Doing well now with resolution of diarrhea.

## 2018-10-06 ENCOUNTER — Telehealth: Payer: Self-pay | Admitting: Internal Medicine

## 2018-10-06 NOTE — Telephone Encounter (Signed)
Called pt to offer sooner appt in [place] office. Pt declined appt. And states that he will go to ER if his sx worsen.

## 2018-10-06 NOTE — Telephone Encounter (Signed)
Per pt phone call-- pt is having shortness of breath more often now, was wanting a sooner apt, he's scheduled to see Dr. Lovena Le on 10/18/2018  Please call [phone]

## 2018-10-06 NOTE — Telephone Encounter (Signed)
Pt c/o increased SOB over the last 3-4 days. States that is worse when laying down. Pt does sleep with 2 pillows at night. Denies chest pain and swelling at this time. Reports that current wt. Is 166Lbs per his home scale, but thinks scale may be wrong. Please advise.

## 2018-10-09 ENCOUNTER — Ambulatory Visit (INDEPENDENT_AMBULATORY_CARE_PROVIDER_SITE_OTHER): Payer: Medicare HMO | Admitting: Pharmacist

## 2018-10-09 DIAGNOSIS — I4891 Unspecified atrial fibrillation: Secondary | ICD-10-CM

## 2018-10-09 DIAGNOSIS — Z5181 Encounter for therapeutic drug level monitoring: Secondary | ICD-10-CM

## 2018-10-09 LAB — POCT INR: INR: 4.2 — AB (ref 2.0–3.0)

## 2018-10-09 NOTE — Patient Instructions (Signed)
Description   No warfarin today and then only 2 tablets then continue coumadin 3 tablets daily Continue greens Recheck in 2 weeks

## 2018-10-16 ENCOUNTER — Encounter (HOSPITAL_COMMUNITY): Payer: Self-pay

## 2018-10-16 ENCOUNTER — Emergency Department (HOSPITAL_COMMUNITY): Payer: Medicare HMO

## 2018-10-16 ENCOUNTER — Inpatient Hospital Stay (HOSPITAL_COMMUNITY)
Admission: EM | Admit: 2018-10-16 | Discharge: 2018-10-18 | DRG: 291 | Disposition: A | Discharge status: Home / Self Care | Payer: Medicare HMO | Attending: Pulmonary Disease | Admitting: Pulmonary Disease

## 2018-10-16 ENCOUNTER — Other Ambulatory Visit: Payer: Self-pay

## 2018-10-16 DIAGNOSIS — Z8249 Family history of ischemic heart disease and other diseases of the circulatory system: Secondary | ICD-10-CM

## 2018-10-16 DIAGNOSIS — Z9049 Acquired absence of other specified parts of digestive tract: Secondary | ICD-10-CM | POA: Diagnosis not present

## 2018-10-16 DIAGNOSIS — I252 Old myocardial infarction: Secondary | ICD-10-CM

## 2018-10-16 DIAGNOSIS — J810 Acute pulmonary edema: Secondary | ICD-10-CM | POA: Diagnosis not present

## 2018-10-16 DIAGNOSIS — M19041 Primary osteoarthritis, right hand: Secondary | ICD-10-CM | POA: Diagnosis present

## 2018-10-16 DIAGNOSIS — I482 Chronic atrial fibrillation, unspecified: Secondary | ICD-10-CM | POA: Diagnosis present

## 2018-10-16 DIAGNOSIS — F17210 Nicotine dependence, cigarettes, uncomplicated: Secondary | ICD-10-CM | POA: Diagnosis present

## 2018-10-16 DIAGNOSIS — R0602 Shortness of breath: Secondary | ICD-10-CM

## 2018-10-16 DIAGNOSIS — J449 Chronic obstructive pulmonary disease, unspecified: Secondary | ICD-10-CM | POA: Diagnosis present

## 2018-10-16 DIAGNOSIS — I2510 Atherosclerotic heart disease of native coronary artery without angina pectoris: Secondary | ICD-10-CM | POA: Diagnosis present

## 2018-10-16 DIAGNOSIS — M19042 Primary osteoarthritis, left hand: Secondary | ICD-10-CM | POA: Diagnosis present

## 2018-10-16 DIAGNOSIS — Z800 Family history of malignant neoplasm of digestive organs: Secondary | ICD-10-CM

## 2018-10-16 DIAGNOSIS — I10 Essential (primary) hypertension: Secondary | ICD-10-CM | POA: Diagnosis present

## 2018-10-16 DIAGNOSIS — J441 Chronic obstructive pulmonary disease with (acute) exacerbation: Secondary | ICD-10-CM | POA: Diagnosis not present

## 2018-10-16 DIAGNOSIS — R0689 Other abnormalities of breathing: Secondary | ICD-10-CM | POA: Diagnosis not present

## 2018-10-16 DIAGNOSIS — E119 Type 2 diabetes mellitus without complications: Secondary | ICD-10-CM

## 2018-10-16 DIAGNOSIS — K449 Diaphragmatic hernia without obstruction or gangrene: Secondary | ICD-10-CM | POA: Diagnosis present

## 2018-10-16 DIAGNOSIS — I5043 Acute on chronic combined systolic (congestive) and diastolic (congestive) heart failure: Secondary | ICD-10-CM | POA: Diagnosis present

## 2018-10-16 DIAGNOSIS — Z833 Family history of diabetes mellitus: Secondary | ICD-10-CM

## 2018-10-16 DIAGNOSIS — Z95810 Presence of automatic (implantable) cardiac defibrillator: Secondary | ICD-10-CM

## 2018-10-16 DIAGNOSIS — E875 Hyperkalemia: Secondary | ICD-10-CM

## 2018-10-16 DIAGNOSIS — I442 Atrioventricular block, complete: Secondary | ICD-10-CM | POA: Diagnosis not present

## 2018-10-16 DIAGNOSIS — R0902 Hypoxemia: Secondary | ICD-10-CM | POA: Diagnosis not present

## 2018-10-16 DIAGNOSIS — E1140 Type 2 diabetes mellitus with diabetic neuropathy, unspecified: Secondary | ICD-10-CM | POA: Diagnosis present

## 2018-10-16 DIAGNOSIS — I130 Hypertensive heart and chronic kidney disease with heart failure and stage 1 through stage 4 chronic kidney disease, or unspecified chronic kidney disease: Secondary | ICD-10-CM | POA: Diagnosis not present

## 2018-10-16 DIAGNOSIS — E1122 Type 2 diabetes mellitus with diabetic chronic kidney disease: Secondary | ICD-10-CM | POA: Diagnosis present

## 2018-10-16 DIAGNOSIS — E785 Hyperlipidemia, unspecified: Secondary | ICD-10-CM | POA: Diagnosis present

## 2018-10-16 DIAGNOSIS — Z85038 Personal history of other malignant neoplasm of large intestine: Secondary | ICD-10-CM

## 2018-10-16 DIAGNOSIS — N182 Chronic kidney disease, stage 2 (mild): Secondary | ICD-10-CM | POA: Diagnosis present

## 2018-10-16 DIAGNOSIS — D631 Anemia in chronic kidney disease: Secondary | ICD-10-CM | POA: Diagnosis present

## 2018-10-16 DIAGNOSIS — R062 Wheezing: Secondary | ICD-10-CM | POA: Diagnosis not present

## 2018-10-16 DIAGNOSIS — Z720 Tobacco use: Secondary | ICD-10-CM | POA: Diagnosis present

## 2018-10-16 DIAGNOSIS — I4891 Unspecified atrial fibrillation: Secondary | ICD-10-CM | POA: Diagnosis present

## 2018-10-16 LAB — CBC WITH DIFFERENTIAL/PLATELET
Abs Immature Granulocytes: 0.03 10*3/uL (ref 0.00–0.07)
BASOS PCT: 1 %
Basophils Absolute: 0 10*3/uL (ref 0.0–0.1)
Eosinophils Absolute: 0 10*3/uL (ref 0.0–0.5)
Eosinophils Relative: 0 %
HCT: 31.2 % — ABNORMAL LOW (ref 39.0–52.0)
Hemoglobin: 9.5 g/dL — ABNORMAL LOW (ref 13.0–17.0)
Immature Granulocytes: 0 %
Lymphocytes Relative: 3 %
Lymphs Abs: 0.2 10*3/uL — ABNORMAL LOW (ref 0.7–4.0)
MCH: 29.5 pg (ref 26.0–34.0)
MCHC: 30.4 g/dL (ref 30.0–36.0)
MCV: 96.9 fL (ref 80.0–100.0)
Monocytes Absolute: 0.1 10*3/uL (ref 0.1–1.0)
Monocytes Relative: 2 %
Neutro Abs: 7.1 10*3/uL (ref 1.7–7.7)
Neutrophils Relative %: 94 %
Platelets: 228 10*3/uL (ref 150–400)
RBC: 3.22 MIL/uL — AB (ref 4.22–5.81)
RDW: 16.5 % — ABNORMAL HIGH (ref 11.5–15.5)
WBC: 7.5 10*3/uL (ref 4.0–10.5)
nRBC: 0 % (ref 0.0–0.2)

## 2018-10-16 LAB — COMPREHENSIVE METABOLIC PANEL
ALK PHOS: 162 U/L — AB (ref 38–126)
ALT: 27 U/L (ref 0–44)
AST: 28 U/L (ref 15–41)
Albumin: 3.6 g/dL (ref 3.5–5.0)
Anion gap: 11 (ref 5–15)
BUN: 27 mg/dL — ABNORMAL HIGH (ref 8–23)
CO2: 27 mmol/L (ref 22–32)
Calcium: 8.5 mg/dL — ABNORMAL LOW (ref 8.9–10.3)
Chloride: 97 mmol/L — ABNORMAL LOW (ref 98–111)
Creatinine, Ser: 1.47 mg/dL — ABNORMAL HIGH (ref 0.61–1.24)
GFR calc Af Amer: 58 mL/min — ABNORMAL LOW (ref >60)
GFR calc non Af Amer: 50 mL/min — ABNORMAL LOW (ref >60)
Glucose, Bld: 266 mg/dL — ABNORMAL HIGH (ref 70–99)
Potassium: 5.2 mmol/L — ABNORMAL HIGH (ref 3.5–5.1)
Sodium: 135 mmol/L (ref 135–145)
Total Bilirubin: 0.9 mg/dL (ref 0.3–1.2)
Total Protein: 7.1 g/dL (ref 6.5–8.1)

## 2018-10-16 LAB — TROPONIN I
Troponin I: 0.06 ng/mL (ref <0.03)
Troponin I: 0.06 ng/mL (ref <0.03)

## 2018-10-16 LAB — GLUCOSE, CAPILLARY: Glucose-Capillary: 270 mg/dL — ABNORMAL HIGH (ref 70–99)

## 2018-10-16 LAB — PROTIME-INR
INR: 2.69
Prothrombin Time: 28.2 seconds — ABNORMAL HIGH (ref 11.4–15.2)

## 2018-10-16 LAB — BRAIN NATRIURETIC PEPTIDE: B Natriuretic Peptide: 2147 pg/mL — ABNORMAL HIGH (ref 0.0–100.0)

## 2018-10-16 MED ORDER — ALBUTEROL SULFATE (2.5 MG/3ML) 0.083% IN NEBU: 2.5000 mg | INHALATION_SOLUTION | RESPIRATORY_TRACT | Status: DC | PRN

## 2018-10-16 MED ORDER — WARFARIN - PHARMACIST DOSING INPATIENT: Freq: Every day | Status: DC

## 2018-10-16 MED ORDER — WARFARIN SODIUM 5 MG PO TABS
10.0000 mg | ORAL_TABLET | Freq: Once | ORAL | Status: AC
  Administered 2018-10-16: 10 mg via ORAL
  Filled 2018-10-16: qty 2

## 2018-10-16 MED ORDER — PANTOPRAZOLE SODIUM 40 MG PO TBEC
40.0000 mg | DELAYED_RELEASE_TABLET | Freq: Every day | ORAL | Status: DC
  Administered 2018-10-17 – 2018-10-18 (×2): 40 mg via ORAL
  Filled 2018-10-16 (×2): qty 1

## 2018-10-16 MED ORDER — FUROSEMIDE 10 MG/ML IJ SOLN
40.0000 mg | Freq: Two times a day (BID) | INTRAMUSCULAR | Status: DC
  Administered 2018-10-16 – 2018-10-18 (×4): 40 mg via INTRAVENOUS
  Filled 2018-10-16 (×4): qty 4

## 2018-10-16 MED ORDER — ACETAMINOPHEN 325 MG PO TABS: 650.0000 mg | ORAL_TABLET | Freq: Four times a day (QID) | ORAL | Status: DC | PRN

## 2018-10-16 MED ORDER — IPRATROPIUM-ALBUTEROL 0.5-2.5 (3) MG/3ML IN SOLN
3.0000 mL | Freq: Once | RESPIRATORY_TRACT | Status: AC
  Administered 2018-10-16: 3 mL via RESPIRATORY_TRACT
  Filled 2018-10-16: qty 3

## 2018-10-16 MED ORDER — INSULIN ASPART 100 UNIT/ML ~~LOC~~ SOLN
0.0000 [IU] | Freq: Three times a day (TID) | SUBCUTANEOUS | Status: DC
  Administered 2018-10-17: 2 [IU] via SUBCUTANEOUS
  Administered 2018-10-17: 3 [IU] via SUBCUTANEOUS
  Administered 2018-10-17 – 2018-10-18 (×2): 2 [IU] via SUBCUTANEOUS

## 2018-10-16 MED ORDER — CARVEDILOL 3.125 MG PO TABS
3.1250 mg | ORAL_TABLET | Freq: Two times a day (BID) | ORAL | Status: DC
  Administered 2018-10-16 – 2018-10-18 (×4): 3.125 mg via ORAL
  Filled 2018-10-16 (×8): qty 1

## 2018-10-16 MED ORDER — ADULT MULTIVITAMIN W/MINERALS CH
1.0000 | ORAL_TABLET | Freq: Every day | ORAL | Status: DC
  Administered 2018-10-16 – 2018-10-18 (×3): 1 via ORAL
  Filled 2018-10-16 (×3): qty 1

## 2018-10-16 MED ORDER — FUROSEMIDE 10 MG/ML IJ SOLN
40.0000 mg | Freq: Once | INTRAMUSCULAR | Status: AC
  Administered 2018-10-16: 40 mg via INTRAVENOUS
  Filled 2018-10-16: qty 4

## 2018-10-16 MED ORDER — HYDRALAZINE HCL 20 MG/ML IJ SOLN: 5.0000 mg | INTRAMUSCULAR | Status: DC | PRN

## 2018-10-16 MED ORDER — SIMVASTATIN 20 MG PO TABS
40.0000 mg | ORAL_TABLET | Freq: Every day | ORAL | Status: DC
  Administered 2018-10-16 – 2018-10-17 (×2): 40 mg via ORAL
  Filled 2018-10-16 (×2): qty 2

## 2018-10-16 MED ORDER — SODIUM CHLORIDE 0.9% FLUSH
3.0000 mL | Freq: Two times a day (BID) | INTRAVENOUS | Status: DC
  Administered 2018-10-16 – 2018-10-18 (×4): 3 mL via INTRAVENOUS

## 2018-10-16 MED ORDER — IPRATROPIUM-ALBUTEROL 0.5-2.5 (3) MG/3ML IN SOLN
3.0000 mL | Freq: Four times a day (QID) | RESPIRATORY_TRACT | Status: DC
  Administered 2018-10-16 – 2018-10-18 (×6): 3 mL via RESPIRATORY_TRACT
  Filled 2018-10-16 (×6): qty 3

## 2018-10-16 MED ORDER — INSULIN ASPART 100 UNIT/ML ~~LOC~~ SOLN
0.0000 [IU] | Freq: Every day | SUBCUTANEOUS | Status: DC
  Administered 2018-10-16: 3 [IU] via SUBCUTANEOUS
  Administered 2018-10-17: 4 [IU] via SUBCUTANEOUS

## 2018-10-16 MED ORDER — ACETAMINOPHEN 650 MG RE SUPP: 650.0000 mg | Freq: Four times a day (QID) | RECTAL | Status: DC | PRN

## 2018-10-16 MED ORDER — FERROUS SULFATE 325 (65 FE) MG PO TABS
325.0000 mg | ORAL_TABLET | Freq: Two times a day (BID) | ORAL | Status: DC
  Administered 2018-10-16 – 2018-10-18 (×4): 325 mg via ORAL
  Filled 2018-10-16 (×4): qty 1

## 2018-10-16 MED ORDER — SERTRALINE HCL 50 MG PO TABS
50.0000 mg | ORAL_TABLET | Freq: Every day | ORAL | Status: DC
  Administered 2018-10-16 – 2018-10-18 (×3): 50 mg via ORAL
  Filled 2018-10-16 (×3): qty 1

## 2018-10-16 NOTE — ED Triage Notes (Signed)
Patient arrived EMS from home. Complaining of SOB. Pt has hx of COPD. Pt given breathing treatment and Solumedrol in route by EMS. Pt states he is feeling much better on arrival to [place]

## 2018-10-16 NOTE — ED Notes (Signed)
Moved pt in to room due to decreased O2 levels.

## 2018-10-16 NOTE — ED Notes (Signed)
Respiratory notified of breathing treatment.

## 2018-10-16 NOTE — Progress Notes (Signed)
Paged mid level regarding patients current troponin level of 0.06. no new orders at this time. Will continue to monitor throughout shift.

## 2018-10-16 NOTE — ED Provider Notes (Signed)
Emergency Department Provider Note   I have reviewed the triage vital signs and the nursing notes.   HISTORY  Chief Complaint Shortness of Breath   HPI [name] is a 63 y.o. male with PMH of A-fib, CAD, COPD, DM, CHF (EF 40%) presents to the emergency department for evaluation of shortness of breath.  Patient states that symptoms have been worsening over the past week.  Patient denies any chest pain.  He states that last night symptoms were more severe and he could not go to sleep.  He has worsening shortness of breath when lying flat.  He has not been experiencing any fevers, chills, but has had coughing.  No hemoptysis.  No vomiting or diarrhea.  He has been using his home nebulizer with no lasting relief.  Ultimately, patient called EMS today with worsening symptoms.  He was given Solu-Medrol and nebulizer in route and is feeling improved.  She does have PRN home oxygen which he tried today but symptoms continued.   Past Medical History:  Diagnosis Date  . A-fib ([place])   . Anemia, normocytic normochromic 2008   2008  . Arteriosclerotic cardiovascular disease (ASCVD)    Acute MI in 2006->  LAD stent, EF-37%  . Arthritis    Hx: of in hands  . Automatic implantable cardioverter-defibrillator in situ   . Carpal tunnel syndrome   . Chronic systolic (congestive) heart failure (HCC)    a. EF 20-25% by echo in 03/2017  . Colon cancer ([place]) 2006   Partial colectomy in 2006  . Complete heart block ([place])    S/P ICD 02/09/13  . COPD (chronic obstructive pulmonary disease) ([place])   . Coronary artery disease   . Diabetes mellitus, type II ([place])    With peripheral neuropathy  . Edema    venous insufficiency  . H/O alcohol dependence ([place])   . H/O hiatal hernia   . Hyperlipidemia   . Hypertension   . Myocardial infarction [facility])    Hx: of 2006  . Neuropathy in diabetes ([place])    Hx: of  . Pacemaker   . Pneumonia 01/2012   01/2012; subsequent admission for chest pain  . Shortness  of breath    Hx: of   . Tobacco abuse     Patient Active Problem List   Diagnosis Date Noted  . Acute on chronic combined systolic (congestive) and diastolic (congestive) heart failure ([place]) 10/16/2018  . Hyperkalemia 10/16/2018  . H/O Clostridium difficile infection 10/02/2018  . ARF (acute renal failure) ([place]) 09/04/2018  . Diarrhea 08/10/2018  . Abdominal bloating 07/22/2017  . IDA (iron deficiency anemia) 07/22/2017  . Barrett's esophagus 07/22/2017  . Heme positive stool   . Acute on chronic combined systolic and diastolic CHF (congestive heart failure) ([place]) 03/25/2017  . History of colon cancer 12/25/2014  . Abdominal pain 12/25/2014  . Encounter for therapeutic drug monitoring 02/11/2014  . Atrial fibrillation ([place]) 02/04/2014  . Dizziness 01/26/2014  . Chronic systolic CHF (congestive heart failure) ([place]) 01/26/2014  . Ataxia 01/26/2014  . Biventricular implantable cardioverter-defibrillator in situ 03/16/2013  . COPD (chronic obstructive pulmonary disease) ([place])   . Arteriosclerotic cardiovascular disease (ASCVD)   . Diabetes mellitus, type II ([place])   . Colon cancer ([place])   . Hypertension   . Hyperlipidemia   . Tobacco abuse   . Anemia, normocytic normochromic   . Chronic diarrhea 07/10/2012    Past Surgical History:  Procedure Laterality Date  . ANKLE SURGERY  Right  . Biventricular ICD Placement  02/09/2013   Dr. Lovena Le  . COLONOSCOPY  08/17/2005   Pancolonic diverticula/Multiple rectal polyps removed as described above. The larger of the two likely responsible for intermittent hematochezia/ Multiple polyps at the hepatic flexure resected with a snare./ Large polypoid lesion growing out of the base of the cecum, just behind the  ileocecal valve. This was not felt to be amendable to endoscopic resection. Biopsied multiple times  . CORONARY ANGIOPLASTY WITH STENT PLACEMENT    . ESOPHAGOGASTRODUODENOSCOPY N/A 03/29/2017   Barrett's esophagus, gastritis,  duodenitis, focal glandular atypia on biopsy. Needs surveillance June 2019   . FLEXIBLE SIGMOIDOSCOPY    06/13/2006   Essentially normal residual rectum status post total colectomy. anastomosis at 25cm.  Focal area of abnormality adjacent to suture, as described above, likely not significant, suspect granulation tissue, biopsied.  Anastomosis otherwise appeared normal.  Distal terminal ileum appeared normal  . FLEXIBLE SIGMOIDOSCOPY N/A 01/13/2015   Dr. Gala Romney: normal-appearing residual rectum, surgical anastomosis, s/p subtotal colectomy. Surveillance 5 years   . FLEXIBLE SIGMOIDOSCOPY N/A 03/29/2017   Dr. Oneida Alar while inpatient: intermittent rectal bleeding due to ischemic erosion at anastomosis,s/p APC therapy. one 3 mm polyp in recum (hyperplastic). surveillance in 3 years  . INGUINAL HERNIA REPAIR    . MULTIPLE EXTRACTIONS WITH ALVEOLOPLASTY N/A 04/09/2013   Procedure: Extraction of tooth #'s 1,2,4,5,6,7,8,9,10,11,12,13,17,18,20,21,22,23,27,28, 29,30,31, and 32 with alveoloplasty.;  Surgeon: Lenn Cal, DDS;  Location: [place];  Service: Oral Surgery;  Laterality: N/A;  . PERMANENT PACEMAKER INSERTION N/A 02/09/2013   Procedure: PERMANENT PACEMAKER INSERTION;  Surgeon: Evans Lance, MD;  Location: [facility] CATH LAB;  Service: Cardiovascular;  Laterality: N/A;  . SUBTOTAL COLECTOMY  09/01/2005   subtotal colectomy  . TEMPORARY PACEMAKER INSERTION Right 02/09/2013   Procedure: TEMPORARY PACEMAKER INSERTION;  Surgeon: Sinclair Grooms, MD;  Location: [facility] CATH LAB;  Service: Cardiovascular;  Laterality: Right;    Allergies Vancomycin  Family History  Problem Relation Age of Onset  . Colon cancer Mother        Chalmer Zheng, ?>age 33.  [name] Heart disease Mother   . Heart disease Father   . Hypertension Brother   . Heart disease Brother   . Diabetes Brother   . Liver disease Neg Hx     Social History Social History   Tobacco Use  . Smoking status: Current Every Day Smoker    Packs/day: 1.00      Years: 40.00    Pack years: 40.00    Types: Cigarettes    Start date: 07/13/1971  . Smokeless tobacco: Never Used  Substance Use Topics  . Alcohol use: No    Alcohol/week: 0.0 standard drinks  . Drug use: No    Review of Systems  Constitutional: No fever/chills Eyes: No visual changes. ENT: No sore throat. Cardiovascular: Denies chest pain. Respiratory: Positive shortness of breath. Gastrointestinal: No abdominal pain.  No nausea, no vomiting.  No diarrhea.  No constipation. Genitourinary: Negative for dysuria. Musculoskeletal: Negative for back pain. Skin: Negative for rash. Neurological: Negative for headaches, focal weakness or numbness.  10-point ROS otherwise negative.  ____________________________________________   PHYSICAL EXAM:  VITAL SIGNS: ED Triage Vitals  Enc Vitals Group     BP 10/16/18 1242 (!) 170/78     Pulse Rate 10/16/18 1242 94     Resp 10/16/18 1242 18     Temp 10/16/18 1242 97.6 F (36.4 C)     Temp Source 10/16/18 1242 Oral  SpO2 10/16/18 1242 96 %     Weight 10/16/18 1244 170 lb (77.1 kg)     Height 10/16/18 1244 6' (1.829 m)     Pain Score 10/16/18 1244 0   Constitutional: Alert and oriented. Well appearing and in no acute distress. Eyes: Conjunctivae are normal. Head: Atraumatic. Nose: No congestion/rhinnorhea. Mouth/Throat: Mucous membranes are moist.  Neck: No stridor. Cardiovascular: Normal rate, regular rhythm. Good peripheral circulation. Grossly normal heart sounds.   Respiratory: Increased respiratory effort. Patient sitting upright in bed. No retractions. Lungs with course wheezing and diminished sounds at the bases.  Gastrointestinal: Soft and nontender. No distention.  Musculoskeletal: No lower extremity tenderness with 1+ pitting edema. No gross deformities of extremities. Neurologic:  Normal speech and language. No gross focal neurologic deficits are appreciated.  Skin:  Skin is warm, dry and intact. No rash  noted.  ____________________________________________   LABS (all labs ordered are listed, but only abnormal results are displayed)  Labs Reviewed  COMPREHENSIVE METABOLIC PANEL - Abnormal; Notable for the following components:      Result Value   Potassium 5.2 (*)    Chloride 97 (*)    Glucose, Bld 266 (*)    BUN 27 (*)    Creatinine, Ser 1.47 (*)    Calcium 8.5 (*)    Alkaline Phosphatase 162 (*)    GFR calc non Af Amer 50 (*)    GFR calc Af Amer 58 (*)    All other components within normal limits  BRAIN NATRIURETIC PEPTIDE - Abnormal; Notable for the following components:   B Natriuretic Peptide 2,147.0 (*)    All other components within normal limits  CBC WITH DIFFERENTIAL/PLATELET - Abnormal; Notable for the following components:   RBC 3.22 (*)    Hemoglobin 9.5 (*)    HCT 31.2 (*)    RDW 16.5 (*)    Lymphs Abs 0.2 (*)    All other components within normal limits  TROPONIN I - Abnormal; Notable for the following components:   Troponin I 0.06 (*)    All other components within normal limits  TROPONIN I   ____________________________________________  EKG   EKG Interpretation  Date/Time:  Monday October 16 2018 15:12:09 EST Ventricular Rate:  92 PR Interval:    QRS Duration: 131 QT Interval:  423 QTC Calculation: 524 R Axis:   -70 Text Interpretation:  Atrial-sensed ventricular-paced rhythm No further analysis attempted due to paced rhythm Confirmed by Nanda Quinton [phone]) on 10/16/2018 4:19:39 PM       ____________________________________________  RADIOLOGY  Dg Chest 2 View  Result Date: 10/16/2018 CLINICAL DATA:  Shortness of breath. EXAM: CHEST - 2 VIEW COMPARISON:  Chest x-ray dated September 07, 2018. FINDINGS: Unchanged left chest wall pacemaker. Stable mild cardiomegaly. Progressive diffuse interstitial thickening. Right greater than left lower lobe atelectasis. Unchanged small right pleural effusion. No pneumothorax. No acute osseous abnormality.  IMPRESSION: 1. Progressive mild diffuse interstitial pulmonary edema. 2. Unchanged small right pleural effusion. Electronically Signed   By: Titus Dubin M.D.   On: 10/16/2018 16:22    ____________________________________________   PROCEDURES  Procedure(s) performed:   Procedures  CRITICAL CARE Performed by: Margette Fast Total critical care time: 35 minutes Critical care time was exclusive of separately billable procedures and treating other patients. Critical care was necessary to treat or prevent imminent or life-threatening deterioration. Critical care was time spent personally by me on the following activities: development of treatment plan with patient and/or surrogate as well as  nursing, discussions with consultants, evaluation of patient's response to treatment, examination of patient, obtaining history from patient or surrogate, ordering and performing treatments and interventions, ordering and review of laboratory studies, ordering and review of radiographic studies, pulse oximetry and re-evaluation of patient's condition.  Nanda Quinton, MD Emergency Medicine  ____________________________________________   INITIAL IMPRESSION / ASSESSMENT AND PLAN / ED COURSE  Pertinent labs & imaging results that were available during my care of the patient were reviewed by me and considered in my medical decision making (see chart for details).  Patient presents to the emergency department for evaluation of shortness of breath.  Unclear if his symptoms are COPD or CHF related.  He is describing orthopnea but does have wheezing on exam.  Plan to continue duo nebs and follow labs including BNP and troponin.  EKG shows V paced rhythm.   Patient's BNP is significantly elevated.  Chest x-ray shows diffuse, mild, pulmonary edema. Plan for admit for diuresis but likely a mixed picture. Troponin is elevated but very low suspicion for primary cardiac event or PE.   Discussed patient's case with  Hospitalist, Dr. Posey Pronto to request admission. Patient and family (if present) updated with plan. Care transferred to Hospitalist service.  I reviewed all nursing notes, vitals, pertinent old records, EKGs, labs, imaging (as available).  ____________________________________________  FINAL CLINICAL IMPRESSION(S) / ED DIAGNOSES  Final diagnoses:  Acute pulmonary edema (HCC)  SOB (shortness of breath)     MEDICATIONS GIVEN DURING THIS VISIT:  Medications  ipratropium-albuterol (DUONEB) 0.5-2.5 (3) MG/3ML nebulizer solution 3 mL (3 mLs Nebulization Given 10/16/18 1544)  furosemide (LASIX) injection 40 mg (40 mg Intravenous Given 10/16/18 1701)    Note:  This document was prepared using Dragon voice recognition software and may include unintentional dictation errors.  Nanda Quinton, MD Emergency Medicine    Deng Kemler, [facility], MD 10/16/18 Drema Halon

## 2018-10-16 NOTE — H&P (Signed)
History and Physical    [name] BOF:[id number] DOB: 1956-06-16 DOA: 10/16/2018  PCP: Sinda Du, MD  Patient coming from: Home via EMS  I have personally briefly reviewed patient's old medical records in [place]  Chief Complaint: Shortness of breath  HPI: [name] is a 63 y.o. male with medical history significant for HFrEF (EF 20-25%, G2DD by TTE 09/07/18), CHB s/p BiV ICD, CAD, Afib on Coumadin, T2DM, HTN, HLD, COPD on prn O2 @ home, CKD Stage II, Colon Cancer s/p partial colectomy in 2006 who presents to the ED with 2 weeks of progressive shortness of breath with cough productive of white sputum.  He has had associated orthopnea and fatigue.  He tried his home nebulizers without improvement.  His symptoms worsened the day prior to admission on 09/23/2019 and he tried his home oxygen but still did not find significant relief.  He says he is taking Lasix 20 mg twice a day and reports good urine output recently.  He has chronic swelling in his legs and says that it is unchanged.  He is taking Coumadin and denies any obvious bleeding.  He denies any subjective fevers, diaphoresis, chest pain, palpitations, or abdominal pain.  He was given a nebulizer treatment and Solu-Medrol on route to the ED via EMS.  ED Course:  Initial vitals showed BP 170/78, pulse 94, RR 18, temp 97.6 Fahrenheit, SPO2 96% on 4 L O2 via [place].  Labs notable for WBC 7.5, hemoglobin 9.5, platelets 228, potassium 5.2, serum glucose 266, BUN 27, creatinine 1.47, troponin I 0.06, BNP 2147.0.  2 view chest x-ray showed ICD in place, stable cardiomegaly, diffuse interstitial pulmonary edema without focal consolidation.  Patient was given a DuoNeb treatment and IV Lasix 40 mg.  Hospitalist service was consulted to admit for further management.  Review of Systems: As per HPI otherwise 10 point review of systems negative.    Past Medical History:  Diagnosis Date  . A-fib ([place])   . Anemia, normocytic  normochromic 2008   2008  . Arteriosclerotic cardiovascular disease (ASCVD)    Acute MI in 2006->  LAD stent, EF-37%  . Arthritis    Hx: of in hands  . Automatic implantable cardioverter-defibrillator in situ   . Carpal tunnel syndrome   . Chronic systolic (congestive) heart failure (HCC)    a. EF 20-25% by echo in 03/2017  . Colon cancer ([place]) 2006   Partial colectomy in 2006  . Complete heart block ([place])    S/P ICD 02/09/13  . COPD (chronic obstructive pulmonary disease) ([place])   . Coronary artery disease   . Diabetes mellitus, type II ([place])    With peripheral neuropathy  . Edema    venous insufficiency  . H/O alcohol dependence ([place])   . H/O hiatal hernia   . Hyperlipidemia   . Hypertension   . Myocardial infarction [facility])    Hx: of 2006  . Neuropathy in diabetes ([place])    Hx: of  . Pacemaker   . Pneumonia 01/2012   01/2012; subsequent admission for chest pain  . Shortness of breath    Hx: of   . Tobacco abuse     Past Surgical History:  Procedure Laterality Date  . ANKLE SURGERY     Right  . Biventricular ICD Placement  02/09/2013   Dr. Lovena Le  . COLONOSCOPY  08/17/2005   Pancolonic diverticula/Multiple rectal polyps removed as described above. The larger of the two likely responsible for intermittent hematochezia/  Multiple polyps at the hepatic flexure resected with a snare./ Large polypoid lesion growing out of the base of the cecum, just behind the  ileocecal valve. This was not felt to be amendable to endoscopic resection. Biopsied multiple times  . CORONARY ANGIOPLASTY WITH STENT PLACEMENT    . ESOPHAGOGASTRODUODENOSCOPY N/A 03/29/2017   Barrett's esophagus, gastritis, duodenitis, focal glandular atypia on biopsy. Needs surveillance June 2019   . FLEXIBLE SIGMOIDOSCOPY    06/13/2006   Essentially normal residual rectum status post total colectomy. anastomosis at 25cm.  Focal area of abnormality adjacent to suture, as described above, likely not significant, suspect  granulation tissue, biopsied.  Anastomosis otherwise appeared normal.  Distal terminal ileum appeared normal  . FLEXIBLE SIGMOIDOSCOPY N/A 01/13/2015   Dr. Gala Romney: normal-appearing residual rectum, surgical anastomosis, s/p subtotal colectomy. Surveillance 5 years   . FLEXIBLE SIGMOIDOSCOPY N/A 03/29/2017   Dr. Oneida Alar while inpatient: intermittent rectal bleeding due to ischemic erosion at anastomosis,s/p APC therapy. one 3 mm polyp in recum (hyperplastic). surveillance in 3 years  . INGUINAL HERNIA REPAIR    . MULTIPLE EXTRACTIONS WITH ALVEOLOPLASTY N/A 04/09/2013   Procedure: Extraction of tooth #'s 1,2,4,5,6,7,8,9,10,11,12,13,17,18,20,21,22,23,27,28, 29,30,31, and 32 with alveoloplasty.;  Surgeon: Lenn Cal, DDS;  Location: [facility];  Service: Oral Surgery;  Laterality: N/A;  . PERMANENT PACEMAKER INSERTION N/A 02/09/2013   Procedure: PERMANENT PACEMAKER INSERTION;  Surgeon: Evans Lance, MD;  Location: [facility] CATH LAB;  Service: Cardiovascular;  Laterality: N/A;  . SUBTOTAL COLECTOMY  09/01/2005   subtotal colectomy  . TEMPORARY PACEMAKER INSERTION Right 02/09/2013   Procedure: TEMPORARY PACEMAKER INSERTION;  Surgeon: Sinclair Grooms, MD;  Location: [facility] CATH LAB;  Service: Cardiovascular;  Laterality: Right;     reports that he has been smoking cigarettes. He started smoking about 47 years ago. He has a 40.00 pack-year smoking history. He has never used smokeless tobacco. He reports that he does not drink alcohol or use drugs.  Allergies  Allergen Reactions  . Vancomycin Itching and Rash    Family History  Problem Relation Age of Onset  . Colon cancer Mother        Rachael Zapanta, ?>age 17.  [name] Heart disease Mother   . Heart disease Father   . Hypertension Brother   . Heart disease Brother   . Diabetes Brother   . Liver disease Neg Hx      Prior to Admission medications   Medication Sig Start Date End Date Taking? Authorizing Provider  acetaminophen (TYLENOL) 500 MG tablet Take 500  mg by mouth every 6 (six) hours as needed for mild pain or moderate pain.  01/08/14  Yes [provider]  albuterol (PROVENTIL HFA;VENTOLIN HFA) 108 (90 BASE) MCG/ACT inhaler Inhale 2 puffs into the lungs every 6 (six) hours as needed. For shortness of breath   Yes [provider]  albuterol (PROVENTIL) (2.5 MG/3ML) 0.083% nebulizer solution Take 2.5 mg by nebulization every 6 (six) hours as needed. For shortness of breath   Yes [provider]  carvedilol (COREG) 3.125 MG tablet Take 1 tablet (3.125 mg total) by mouth 2 (two) times daily with a meal. 09/07/18  Yes Irene Pap N, DO  ferrous sulfate 325 (65 FE) MG EC tablet Take 325 mg by mouth 2 (two) times daily.   Yes [provider]  furosemide (LASIX) 40 MG tablet Take 1 tablet (40 mg total) by mouth daily. Patient taking differently: Take 40-60 mg by mouth 2 (two) times daily. Takes 1  tablet in am and 0.5 tablet at night 09/07/18  Yes Hall, Carole N, DO  insulin glargine (LANTUS) 100 UNIT/ML injection Inject 20 Units into the skin daily as needed (for high blood sugar).    Yes [provider]  Lactobacillus (ACIDOPHOLUS PO) Take 1 tablet by mouth daily.   Yes [provider]  magnesium oxide (MAG-OX) 400 MG tablet Take 400 mg by mouth 2 (two) times daily.   Yes [provider]  Multiple Vitamin (MULTIVITAMIN) tablet Take 1 tablet by mouth daily.   Yes [provider]  nitroGLYCERIN (NITROSTAT) 0.4 MG SL tablet Place 1 tablet (0.4 mg total) under the tongue every 5 (five) minutes x 3 doses as needed. For chest pain 03/08/18  Yes Evans Lance, MD  ondansetron (ZOFRAN) 4 MG tablet Take 4 mg by mouth 4 (four) times daily as needed for nausea or vomiting.   Yes [provider]  pantoprazole (PROTONIX) 40 MG tablet Take 1 tablet (40 mg total) by mouth daily before breakfast. 03/31/17  Yes Sinda Du, MD  primidone (MYSOLINE) 50 MG tablet Take 50 mg by mouth daily.    Yes  [provider]  sertraline (ZOLOFT) 50 MG tablet Take 50 mg by mouth daily.   Yes [provider]  simvastatin (ZOCOR) 40 MG tablet Take 40 mg by mouth at bedtime.    Yes [provider]  warfarin (COUMADIN) 4 MG tablet Take 3 tablets daily or as directed Patient taking differently: Take 12 mg by mouth every evening.  09/18/18  Yes Arnoldo Lenis, MD    Physical Exam: Vitals:   10/16/18 1830 10/16/18 1900 10/16/18 1930 10/16/18 2023  BP: (!) 169/77 (!) 187/103 (!) 161/74 (!) 156/80  Pulse: 76 81 78 84  Resp:      Temp:    98.3 F (36.8 C)  TempSrc:    Oral  SpO2: 96% 98% (!) 87% 95%  Weight:      Height:        Constitutional: Chronically ill-appearing thin disheveled man sitting up on the side of the stretcher, NAD, calm, comfortable Eyes: PERRL, lids and conjunctivae normal ENMT: Mucous membranes are moist. Neck: normal, supple, no masses. Respiratory: Inspiratory crackles diffuse lung fields.  Normal respiratory effort. No accessory muscle use.  Cardiovascular: Regular rate and rhythm, no murmurs / rubs / gallops.  +2 pitting edema bilateral lower legs.   Abdomen: no tenderness, no masses palpated. No hepatosplenomegaly. Bowel sounds positive.  Musculoskeletal: no clubbing / cyanosis. No joint deformity upper and lower extremities. Good ROM, no contractures. Skin: Stasis dermatitis changes both legs Neurologic: CN 2-12 grossly intact. Sensation intact, Strength 5/5 in all 4.  Psychiatric: Normal judgment and insight. Alert and oriented x 3. Normal mood.    Labs on Admission: I have personally reviewed following labs and imaging studies  CBC: Recent Labs  Lab 10/16/18 1536  WBC 7.5  NEUTROABS 7.1  HGB 9.5*  HCT 31.2*  MCV 96.9  PLT 109   Basic Metabolic Panel: Recent Labs  Lab 10/16/18 1536  NA 135  K 5.2*  CL 97*  CO2 27  GLUCOSE 266*  BUN 27*  CREATININE 1.47*  CALCIUM 8.5*   GFR: Estimated Creatinine Clearance: 56.8  mL/min (A) (by C-G formula based on SCr of 1.47 mg/dL (H)). Liver Function Tests: Recent Labs  Lab 10/16/18 1536  AST 28  ALT 27  ALKPHOS 162*  BILITOT 0.9  PROT 7.1  ALBUMIN 3.6   No results  for input(s): LIPASE, AMYLASE in the last 168 hours. No results for input(s): AMMONIA in the last 168 hours. Coagulation Profile: Recent Labs  Lab 10/16/18 1536  INR 2.69   Cardiac Enzymes: Recent Labs  Lab 10/16/18 1537  TROPONINI 0.06*   BNP (last 3 results) No results for input(s): PROBNP in the last 8760 hours. HbA1C: No results for input(s): HGBA1C in the last 72 hours. CBG: Recent Labs  Lab 10/16/18 2027  GLUCAP 270*   Lipid Profile: No results for input(s): CHOL, HDL, LDLCALC, TRIG, CHOLHDL, LDLDIRECT in the last 72 hours. Thyroid Function Tests: No results for input(s): TSH, T4TOTAL, FREET4, T3FREE, THYROIDAB in the last 72 hours. Anemia Panel: No results for input(s): VITAMINB12, FOLATE, FERRITIN, TIBC, IRON, RETICCTPCT in the last 72 hours. Urine analysis:    Component Value Date/Time   COLORURINE YELLOW 09/04/2018 2115   APPEARANCEUR HAZY (A) 09/04/2018 2115   LABSPEC 1.015 09/04/2018 2115   PHURINE 5.0 09/04/2018 2115   GLUCOSEU NEGATIVE 09/04/2018 2115   HGBUR NEGATIVE 09/04/2018 2115   BILIRUBINUR NEGATIVE 09/04/2018 2115   [place] NEGATIVE 09/04/2018 2115   PROTEINUR 30 (A) 09/04/2018 2115   UROBILINOGEN 0.2 01/26/2014 0909   NITRITE NEGATIVE 09/04/2018 2115   LEUKOCYTESUR TRACE (A) 09/04/2018 2115    Radiological Exams on Admission: Dg Chest 2 View  Result Date: 10/16/2018 CLINICAL DATA:  Shortness of breath. EXAM: CHEST - 2 VIEW COMPARISON:  Chest x-ray dated September 07, 2018. FINDINGS: Unchanged left chest wall pacemaker. Stable mild cardiomegaly. Progressive diffuse interstitial thickening. Right greater than left lower lobe atelectasis. Unchanged small right pleural effusion. No pneumothorax. No acute osseous abnormality. IMPRESSION: 1.  Progressive mild diffuse interstitial pulmonary edema. 2. Unchanged small right pleural effusion. Electronically Signed   By: Titus Dubin M.D.   On: 10/16/2018 16:22    EKG: Independently reviewed.  Atrial sensed V paced rhythm.  Assessment/Plan Principal Problem:   Acute on chronic combined systolic (congestive) and diastolic (congestive) heart failure (HCC) Active Problems:   COPD (chronic obstructive pulmonary disease) ([place])   Diabetes mellitus, type II ([place])   Hypertension   Hyperlipidemia   Tobacco abuse   Atrial fibrillation ([place])   Hyperkalemia   NOEMI ISHMAEL is a 63 y.o. male with medical history significant for HFrEF (EF 20-25%, G2DD by TTE 09/07/18), CHB s/p BiV ICD, CAD, Afib on Coumadin, T2DM, HTN, HLD, COPD on prn O2 @ home, CKD Stage II, Colon Cancer s/p partial colectomy in 2006 who presents to the ED with acute on chronic combined systolic diastolic CHF exacerbation.  Acute on chronic combined systolic and diastolic CHF/CHB s/p BiV ICD: Patient with peripheral edema, elevated BNP, and pulmonary edema on chest x-ray.  Troponin mildly elevated at 0.06 likely due to demand ischemia. TTE 09/07/18 showed EF 20-25%, G2DD. -Continue IV Lasix 40 mg twice daily -Strict I/O's, daily weights -Trend troponin -Continue Coreg -Monitor electrolytes and renal function with diuresis  COPD: Suspect some component of COPD contributing to his symptoms. -Schedule duo nebs and as needed albuterol nebulizers -Continue supplemental oxygen and wean off as able  Hyperkalemia: Potassium mildly elevated at 5.2.  Expect this will decrease with diuresis.  He is not taking potassium supplement as an outpatient. -Recheck in a.m.  Atrial fibrillation: He has a paced rhythm. -Continue Coumadin and Coreg  Type 2 diabetes: Prescribed Lantus 20 units daily as needed for high blood sugar. -Sensitive SSI while inpatient  Hypertension: Hypertensive on admission. -Continue Coreg, IV Lasix as  above, PRN IV  hydralazine  Anemia: Hemoglobin 9.5 on admission appears to be at recent baseline, likely anemia of chronic disease. -Continue to monitor  Hyperlipidemia: -Continue simvastatin  Tobacco use: Reports smoking 2 pack/day prior to admission for 40 years.  He tells me he is going to quit starting now.   DVT prophylaxis: Coumadin Code Status: Full code Family Communication: None present at bedside on admission Disposition Plan: Pending clinical improvement Consults called: None Admission status: Inpatient   Zada Finders MD [facility] Pager [phone]  If 7PM-7AM, please contact night-coverage [email or web address]  10/16/2018, 9:11 PM

## 2018-10-16 NOTE — Progress Notes (Signed)
ANTICOAGULATION CONSULT NOTE - Initial Consult  Pharmacy Consult for warfarin Indication: atrial fibrillation  Allergies  Allergen Reactions  . Vancomycin Itching and Rash    Patient Measurements: Height: 6' (182.9 cm) Weight: 170 lb (77.1 kg) IBW/kg (Calculated) : 77.6   Vital Signs: Temp: 97.6 F (36.4 C) (01/13 1242) Temp Source: Oral (01/13 1242) BP: 161/74 (01/13 1930) Pulse Rate: 78 (01/13 1930)  Labs: Recent Labs    10/16/18 1536 10/16/18 1537  HGB 9.5*  --   HCT 31.2*  --   PLT 228  --   LABPROT 28.2*  --   INR 2.69  --   CREATININE 1.47*  --   TROPONINI  --  0.06*    Estimated Creatinine Clearance: 56.8 mL/min (A) (by C-G formula based on SCr of 1.47 mg/dL (H)).   Medical History: Past Medical History:  Diagnosis Date  . A-fib ([place])   . Anemia, normocytic normochromic 2008   2008  . Arteriosclerotic cardiovascular disease (ASCVD)    Acute MI in 2006->  LAD stent, EF-37%  . Arthritis    Hx: of in hands  . Automatic implantable cardioverter-defibrillator in situ   . Carpal tunnel syndrome   . Chronic systolic (congestive) heart failure (HCC)    a. EF 20-25% by echo in 03/2017  . Colon cancer ([place]) 2006   Partial colectomy in 2006  . Complete heart block ([place])    S/P ICD 02/09/13  . COPD (chronic obstructive pulmonary disease) ([place])   . Coronary artery disease   . Diabetes mellitus, type II ([place])    With peripheral neuropathy  . Edema    venous insufficiency  . H/O alcohol dependence ([place])   . H/O hiatal hernia   . Hyperlipidemia   . Hypertension   . Myocardial infarction [facility])    Hx: of 2006  . Neuropathy in diabetes ([place])    Hx: of  . Pacemaker   . Pneumonia 01/2012   01/2012; subsequent admission for chest pain  . Shortness of breath    Hx: of   . Tobacco abuse     Medications:  Medications Prior to Admission  Medication Sig Dispense Refill Last Dose  . acetaminophen (TYLENOL) 500 MG tablet Take 500 mg by mouth every 6 (six)  hours as needed for mild pain or moderate pain.    Past Week at Unknown time  . albuterol (PROVENTIL HFA;VENTOLIN HFA) 108 (90 BASE) MCG/ACT inhaler Inhale 2 puffs into the lungs every 6 (six) hours as needed. For shortness of breath   unknown  . albuterol (PROVENTIL) (2.5 MG/3ML) 0.083% nebulizer solution Take 2.5 mg by nebulization every 6 (six) hours as needed. For shortness of breath   10/16/2018 at Unknown time  . carvedilol (COREG) 3.125 MG tablet Take 1 tablet (3.125 mg total) by mouth 2 (two) times daily with a meal. 60 tablet 0 10/15/2018 at 2300  . ferrous sulfate 325 (65 FE) MG EC tablet Take 325 mg by mouth 2 (two) times daily.   10/15/2018 at Unknown time  . furosemide (LASIX) 40 MG tablet Take 1 tablet (40 mg total) by mouth daily. (Patient taking differently: Take 40-60 mg by mouth 2 (two) times daily. Takes 1 tablet in am and 0.5 tablet at night) 30 tablet 0 10/15/2018 at Unknown time  . insulin glargine (LANTUS) 100 UNIT/ML injection Inject 20 Units into the skin daily as needed (for high blood sugar).    Past Month at Unknown time  . Lactobacillus (ACIDOPHOLUS PO) Take  1 tablet by mouth daily.   10/15/2018 at Unknown time  . magnesium oxide (MAG-OX) 400 MG tablet Take 400 mg by mouth 2 (two) times daily.   10/15/2018 at Unknown time  . Multiple Vitamin (MULTIVITAMIN) tablet Take 1 tablet by mouth daily.   10/15/2018 at Unknown time  . nitroGLYCERIN (NITROSTAT) 0.4 MG SL tablet Place 1 tablet (0.4 mg total) under the tongue every 5 (five) minutes x 3 doses as needed. For chest pain 25 tablet 3 Past Week at Unknown time  . ondansetron (ZOFRAN) 4 MG tablet Take 4 mg by mouth 4 (four) times daily as needed for nausea or vomiting.   Past Week at Unknown time  . pantoprazole (PROTONIX) 40 MG tablet Take 1 tablet (40 mg total) by mouth daily before breakfast. 30 tablet 12 10/15/2018 at Unknown time  . primidone (MYSOLINE) 50 MG tablet Take 50 mg by mouth daily.    10/15/2018 at Unknown time  .  sertraline (ZOLOFT) 50 MG tablet Take 50 mg by mouth daily.   10/15/2018 at Unknown time  . simvastatin (ZOCOR) 40 MG tablet Take 40 mg by mouth at bedtime.    10/15/2018 at Unknown time  . warfarin (COUMADIN) 4 MG tablet Take 3 tablets daily or as directed (Patient taking differently: Take 12 mg by mouth every evening. ) 260 tablet 3 10/15/2018 at 2300`    Assessment: Pharmacy consulted to dose warfarin in patient with atrial fibrillation.  Patient's home dose of warfarin is listed as 12 mg daily.  INR on admission is 2.69.  Goal of Therapy:  INR 2-3 Monitor platelets by anticoagulation protocol: Yes   Plan:  Warfarin 10 mg x 1 dose. Monitor daily INR and s/s of bleeding  Ramond Craver 10/16/2018,8:22 PM

## 2018-10-16 NOTE — ED Notes (Addendum)
Date and time results received: 10/16/18 4:42 PM  Test: troponins  Critical Value: 0.06  Name of Provider Notified: Long, MD  Orders Received? Or Actions Taken?: Continue to monitor.

## 2018-10-16 NOTE — ED Notes (Signed)
Attempted to notify respiratory, no answer.

## 2018-10-17 LAB — TROPONIN I: Troponin I: 0.05 ng/mL (ref <0.03)

## 2018-10-17 LAB — CBC
HCT: 24.8 % — ABNORMAL LOW (ref 39.0–52.0)
Hemoglobin: 7.6 g/dL — ABNORMAL LOW (ref 13.0–17.0)
MCH: 29.6 pg (ref 26.0–34.0)
MCHC: 30.6 g/dL (ref 30.0–36.0)
MCV: 96.5 fL (ref 80.0–100.0)
Platelets: 178 10*3/uL (ref 150–400)
RBC: 2.57 MIL/uL — ABNORMAL LOW (ref 4.22–5.81)
RDW: 16.3 % — ABNORMAL HIGH (ref 11.5–15.5)
WBC: 4.4 10*3/uL (ref 4.0–10.5)
nRBC: 0 % (ref 0.0–0.2)

## 2018-10-17 LAB — BASIC METABOLIC PANEL
Anion gap: 8 (ref 5–15)
BUN: 27 mg/dL — ABNORMAL HIGH (ref 8–23)
CALCIUM: 8.4 mg/dL — AB (ref 8.9–10.3)
CHLORIDE: 101 mmol/L (ref 98–111)
CO2: 29 mmol/L (ref 22–32)
Creatinine, Ser: 1.36 mg/dL — ABNORMAL HIGH (ref 0.61–1.24)
GFR calc Af Amer: >60 mL/min (ref >60)
GFR calc non Af Amer: 55 mL/min — ABNORMAL LOW (ref >60)
Glucose, Bld: 195 mg/dL — ABNORMAL HIGH (ref 70–99)
Potassium: 4.3 mmol/L (ref 3.5–5.1)
Sodium: 138 mmol/L (ref 135–145)

## 2018-10-17 LAB — PROTIME-INR
INR: 3.32
Prothrombin Time: 33.2 seconds — ABNORMAL HIGH (ref 11.4–15.2)

## 2018-10-17 LAB — GLUCOSE, CAPILLARY
Glucose-Capillary: 165 mg/dL — ABNORMAL HIGH (ref 70–99)
Glucose-Capillary: 235 mg/dL — ABNORMAL HIGH (ref 70–99)
Glucose-Capillary: 191 mg/dL — ABNORMAL HIGH (ref 70–99)
Glucose-Capillary: 345 mg/dL — ABNORMAL HIGH (ref 70–99)

## 2018-10-17 NOTE — Progress Notes (Signed)
Central Telemetry called and said patient had 10 beat run of VTach. Patient denies any chest pain or discomfort. Mid level made aware. No new orders at this time. Will continue to monitor throughout shift.

## 2018-10-17 NOTE — Progress Notes (Signed)
Subjective: He was admitted yesterday with acute on chronic combined systolic and diastolic heart failure.  He says he feels better.  INO -600.  He is not having any chest pain.  He does not have any other new complaints.  He probably has some element of COPD exacerbation as well  Objective: Vital signs in last 24 hours: Temp:  [97.6 F (36.4 C)-98.3 F (36.8 C)] 98.1 F (36.7 C) (01/14 0537) Pulse Rate:  [71-94] 71 (01/14 0537) Resp:  [18-25] 25 (01/13 1545) BP: (139-187)/(64-103) 139/64 (01/14 0537) SpO2:  [87 %-100 %] 100 % (01/14 0759) Weight:  [77.1 kg-78.5 kg] 78.5 kg (01/14 0537) Weight change:  Last BM Date: 10/15/18  Intake/Output from previous day: 01/13 0701 - 01/14 0700 In: -  Out: 600 [Urine:600]  PHYSICAL EXAM General appearance: alert, cooperative and no distress Resp: He has some rales bilaterally Cardio: His heart is regular with soft systolic murmur GI: soft, non-tender; bowel sounds normal; no masses,  no organomegaly Extremities: He still has some edema and he has chronic venous stasis changes on the right greater than left leg  Lab Results:  Results for orders placed or performed during the hospital encounter of 10/16/18 (from the past 48 hour(s))  Comprehensive metabolic panel     Status: Abnormal   Collection Time: 10/16/18  3:36 PM  Result Value Ref Range   Sodium 135 135 - 145 mmol/L   Potassium 5.2 (H) 3.5 - 5.1 mmol/L   Chloride 97 (L) 98 - 111 mmol/L   CO2 27 22 - 32 mmol/L   Glucose, Bld 266 (H) 70 - 99 mg/dL   BUN 27 (H) 8 - 23 mg/dL   Creatinine, Ser 1.47 (H) 0.61 - 1.24 mg/dL   Calcium 8.5 (L) 8.9 - 10.3 mg/dL   Total Protein 7.1 6.5 - 8.1 g/dL   Albumin 3.6 3.5 - 5.0 g/dL   AST 28 15 - 41 U/L   ALT 27 0 - 44 U/L   Alkaline Phosphatase 162 (H) 38 - 126 U/L   Total Bilirubin 0.9 0.3 - 1.2 mg/dL   GFR calc non Af Amer 50 (L) >60 mL/min   GFR calc Af Amer 58 (L) >60 mL/min   Anion gap 11 5 - 15    Comment: Performed at [facility], [address], [place], [place] [address]  Brain natriuretic peptide     Status: Abnormal   Collection Time: 10/16/18  3:36 PM  Result Value Ref Range   B Natriuretic Peptide 2,147.0 (H) 0.0 - 100.0 pg/mL    Comment: Performed at [facility], [address] [place], [place] [address]  CBC with Differential     Status: Abnormal   Collection Time: 10/16/18  3:36 PM  Result Value Ref Range   WBC 7.5 4.0 - 10.5 K/uL   RBC 3.22 (L) 4.22 - 5.81 MIL/uL   Hemoglobin 9.5 (L) 13.0 - 17.0 g/dL   HCT 31.2 (L) 39.0 - 52.0 %   MCV 96.9 80.0 - 100.0 fL   MCH 29.5 26.0 - 34.0 pg   MCHC 30.4 30.0 - 36.0 g/dL   RDW 16.5 (H) 11.5 - 15.5 %   Platelets 228 150 - 400 K/uL   nRBC 0.0 0.0 - 0.2 %   Neutrophils Relative % 94 %   Neutro Abs 7.1 1.7 - 7.7 K/uL   Lymphocytes Relative 3 %   Lymphs Abs 0.2 (L) 0.7 - 4.0 K/uL   Monocytes Relative 2 %   Monocytes  Absolute 0.1 0.1 - 1.0 K/uL   Eosinophils Relative 0 %   Eosinophils Absolute 0.0 0.0 - 0.5 K/uL   Basophils Relative 1 %   Basophils Absolute 0.0 0.0 - 0.1 K/uL   Immature Granulocytes 0 %   Abs Immature Granulocytes 0.03 0.00 - 0.07 K/uL    Comment: Performed at [facility], [address], [place], [place] [address]  Protime-INR     Status: Abnormal   Collection Time: 10/16/18  3:36 PM  Result Value Ref Range   Prothrombin Time 28.2 (H) 11.4 - 15.2 seconds   INR 2.69     Comment: Performed at [facility], [address], [place], [place] [address]  Troponin I - ONCE - STAT     Status: Abnormal   Collection Time: 10/16/18  3:37 PM  Result Value Ref Range   Troponin I 0.06 (HH) <0.03 ng/mL    Comment: CRITICAL RESULT CALLED TO, READ BACK BY AND VERIFIED WITH: ELLIS,C ON 10/16/18 AT 1640 BY LOY,C Performed at [facility], [address], [place], [place] [address]   Glucose, capillary     Status: Abnormal   Collection Time: 10/16/18  8:27 PM  Result Value Ref Range   Glucose-Capillary 270 (H) 70 - 99 mg/dL  Troponin I - Now Then Q6H      Status: Abnormal   Collection Time: 10/16/18  9:17 PM  Result Value Ref Range   Troponin I 0.06 (HH) <0.03 ng/mL    Comment: CRITICAL VALUE NOTED.  VALUE IS CONSISTENT WITH PREVIOUSLY REPORTED AND CALLED VALUE. Performed at [facility], [address] [place], [place] [address]   Basic metabolic panel     Status: Abnormal   Collection Time: 10/17/18  3:56 AM  Result Value Ref Range   Sodium 138 135 - 145 mmol/L   Potassium 4.3 3.5 - 5.1 mmol/L    Comment: DELTA CHECK NOTED   Chloride 101 98 - 111 mmol/L   CO2 29 22 - 32 mmol/L   Glucose, Bld 195 (H) 70 - 99 mg/dL   BUN 27 (H) 8 - 23 mg/dL   Creatinine, Ser 1.36 (H) 0.61 - 1.24 mg/dL   Calcium 8.4 (L) 8.9 - 10.3 mg/dL   GFR calc non Af Amer 55 (L) >60 mL/min   GFR calc Af Amer >60 >60 mL/min   Anion gap 8 5 - 15    Comment: Performed at [facility], [address], [place], [place] [address]  CBC     Status: Abnormal   Collection Time: 10/17/18  3:56 AM  Result Value Ref Range   WBC 4.4 4.0 - 10.5 K/uL   RBC 2.57 (L) 4.22 - 5.81 MIL/uL   Hemoglobin 7.6 (L) 13.0 - 17.0 g/dL   HCT 24.8 (L) 39.0 - 52.0 %   MCV 96.5 80.0 - 100.0 fL   MCH 29.6 26.0 - 34.0 pg   MCHC 30.6 30.0 - 36.0 g/dL   RDW 16.3 (H) 11.5 - 15.5 %   Platelets 178 150 - 400 K/uL   nRBC 0.0 0.0 - 0.2 %    Comment: Performed at [facility], [address] [place], [place] [address]  Troponin I - Now Then Q6H     Status: Abnormal   Collection Time: 10/17/18  3:56 AM  Result Value Ref Range   Troponin I 0.05 (HH) <0.03 ng/mL    Comment: CRITICAL VALUE NOTED.  VALUE IS CONSISTENT WITH PREVIOUSLY REPORTED AND CALLED VALUE. Performed at [facility], [phone]  [address] [place], [place] [address]   Protime-INR     Status: Abnormal   Collection Time: 10/17/18  3:56 AM  Result Value Ref Range   Prothrombin Time 33.2 (H) 11.4 - 15.2 seconds   INR 3.32     Comment: Performed at [facility], [address], [place], [place] [address]  Glucose, capillary     Status:  Abnormal   Collection Time: 10/17/18  7:39 AM  Result Value Ref Range   Glucose-Capillary 165 (H) 70 - 99 mg/dL    ABGS No results for input(s): PHART, PO2ART, TCO2, HCO3 in the last 72 hours.  Invalid input(s): PCO2 CULTURES No results found for this or any previous visit (from the past 240 hour(s)). Studies/Results: Dg Chest 2 View  Result Date: 10/16/2018 CLINICAL DATA:  Shortness of breath. EXAM: CHEST - 2 VIEW COMPARISON:  Chest x-ray dated September 07, 2018. FINDINGS: Unchanged left chest wall pacemaker. Stable mild cardiomegaly. Progressive diffuse interstitial thickening. Right greater than left lower lobe atelectasis. Unchanged small right pleural effusion. No pneumothorax. No acute osseous abnormality. IMPRESSION: 1. Progressive mild diffuse interstitial pulmonary edema. 2. Unchanged small right pleural effusion. Electronically Signed   By: Titus Dubin M.D.   On: 10/16/2018 16:22    Medications:  Prior to Admission:  Medications Prior to Admission  Medication Sig Dispense Refill Last Dose  . acetaminophen (TYLENOL) 500 MG tablet Take 500 mg by mouth every 6 (six) hours as needed for mild pain or moderate pain.    Past Week at Unknown time  . albuterol (PROVENTIL HFA;VENTOLIN HFA) 108 (90 BASE) MCG/ACT inhaler Inhale 2 puffs into the lungs every 6 (six) hours as needed. For shortness of breath   unknown  . albuterol (PROVENTIL) (2.5 MG/3ML) 0.083% nebulizer solution Take 2.5 mg by nebulization every 6 (six) hours as needed. For shortness of breath   10/16/2018 at Unknown time  . carvedilol (COREG) 3.125 MG tablet Take 1 tablet (3.125 mg total) by mouth 2 (two) times daily with a meal. 60 tablet 0 10/15/2018 at 2300  . ferrous sulfate 325 (65 FE) MG EC tablet Take 325 mg by mouth 2 (two) times daily.   10/15/2018 at Unknown time  . furosemide (LASIX) 40 MG tablet Take 1 tablet (40 mg total) by mouth daily. (Patient taking differently: Take 40-60 mg by mouth 2 (two) times daily.  Takes 1 tablet in am and 0.5 tablet at night) 30 tablet 0 10/15/2018 at Unknown time  . insulin glargine (LANTUS) 100 UNIT/ML injection Inject 20 Units into the skin daily as needed (for high blood sugar).    Past Month at Unknown time  . Lactobacillus (ACIDOPHOLUS PO) Take 1 tablet by mouth daily.   10/15/2018 at Unknown time  . magnesium oxide (MAG-OX) 400 MG tablet Take 400 mg by mouth 2 (two) times daily.   10/15/2018 at Unknown time  . Multiple Vitamin (MULTIVITAMIN) tablet Take 1 tablet by mouth daily.   10/15/2018 at Unknown time  . nitroGLYCERIN (NITROSTAT) 0.4 MG SL tablet Place 1 tablet (0.4 mg total) under the tongue every 5 (five) minutes x 3 doses as needed. For chest pain 25 tablet 3 Past Week at Unknown time  . ondansetron (ZOFRAN) 4 MG tablet Take 4 mg by mouth 4 (four) times daily as needed for nausea or vomiting.   Past Week at Unknown time  . pantoprazole (PROTONIX) 40 MG tablet Take 1 tablet (40 mg total) by mouth daily before breakfast. 30 tablet 12 10/15/2018 at Unknown  time  . primidone (MYSOLINE) 50 MG tablet Take 50 mg by mouth daily.    10/15/2018 at Unknown time  . sertraline (ZOLOFT) 50 MG tablet Take 50 mg by mouth daily.   10/15/2018 at Unknown time  . simvastatin (ZOCOR) 40 MG tablet Take 40 mg by mouth at bedtime.    10/15/2018 at Unknown time  . warfarin (COUMADIN) 4 MG tablet Take 3 tablets daily or as directed (Patient taking differently: Take 12 mg by mouth every evening. ) 260 tablet 3 10/15/2018 at 2300`   Scheduled: . carvedilol  3.125 mg Oral BID WC  . ferrous sulfate  325 mg Oral BID  . furosemide  40 mg Intravenous BID  . insulin aspart  0-5 Units Subcutaneous QHS  . insulin aspart  0-9 Units Subcutaneous TID WC  . ipratropium-albuterol  3 mL Nebulization Q6H  . multivitamin with minerals  1 tablet Oral Daily  . pantoprazole  40 mg Oral QAC breakfast  . sertraline  50 mg Oral Daily  . simvastatin  40 mg Oral QHS  . sodium chloride flush  3 mL Intravenous Q12H   . Warfarin - Pharmacist Dosing Inpatient   Does not apply q1800   Continuous:  MLJ:[id number] **OR** acetaminophen, albuterol, hydrALAZINE  Assesment: He is admitted with acute on chronic combined systolic and diastolic heart failure.  He is better but he is only down about 600 cc.  He may have some element of COPD exacerbation as well which is being treated.  He has had atrial fib but he is either in paced or sinus rhythm now by exam  He has hypertension which is stable  He has diabetes and that seems to be doing okay Principal Problem:   Acute on chronic combined systolic (congestive) and diastolic (congestive) heart failure (HCC) Active Problems:   COPD (chronic obstructive pulmonary disease) (HCC)   Diabetes mellitus, type II ([place])   Hypertension   Hyperlipidemia   Tobacco abuse   Atrial fibrillation ([place])   Hyperkalemia    Plan: Continue treatments.  He is improved and has some potential for discharge in the next 24 to 48 hours    LOS: 1 day   Alonza Bogus 10/17/2018, 8:47 AM

## 2018-10-17 NOTE — Progress Notes (Signed)
ANTICOAGULATION CONSULT NOTE -  Pharmacy Consult for warfarin Indication: atrial fibrillation  Allergies  Allergen Reactions  . Vancomycin Itching and Rash    Patient Measurements: Height: 6' (182.9 cm) Weight: 173 lb 1 oz (78.5 kg) IBW/kg (Calculated) : 77.6   Vital Signs: Temp: 98.1 F (36.7 C) (01/14 0537) Temp Source: Oral (01/14 0537) BP: 139/64 (01/14 0537) Pulse Rate: 71 (01/14 0537)  Labs: Recent Labs    10/16/18 1536 10/16/18 1537 10/16/18 2117 10/17/18 0356  HGB 9.5*  --   --  7.6*  HCT 31.2*  --   --  24.8*  PLT 228  --   --  178  LABPROT 28.2*  --   --  33.2*  INR 2.69  --   --  3.32  CREATININE 1.47*  --   --  1.36*  TROPONINI  --  0.06* 0.06* 0.05*    Estimated Creatinine Clearance: 61.8 mL/min (A) (by C-G formula based on SCr of 1.36 mg/dL (H)).   Medical History: Past Medical History:  Diagnosis Date  . A-fib ([place])   . Anemia, normocytic normochromic 2008   2008  . Arteriosclerotic cardiovascular disease (ASCVD)    Acute MI in 2006->  LAD stent, EF-37%  . Arthritis    Hx: of in hands  . Automatic implantable cardioverter-defibrillator in situ   . Carpal tunnel syndrome   . Chronic systolic (congestive) heart failure (HCC)    a. EF 20-25% by echo in 03/2017  . Colon cancer ([place]) 2006   Partial colectomy in 2006  . Complete heart block ([place])    S/P ICD 02/09/13  . COPD (chronic obstructive pulmonary disease) ([place])   . Coronary artery disease   . Diabetes mellitus, type II ([place])    With peripheral neuropathy  . Edema    venous insufficiency  . H/O alcohol dependence ([place])   . H/O hiatal hernia   . Hyperlipidemia   . Hypertension   . Myocardial infarction [facility])    Hx: of 2006  . Neuropathy in diabetes ([place])    Hx: of  . Pacemaker   . Pneumonia 01/2012   01/2012; subsequent admission for chest pain  . Shortness of breath    Hx: of   . Tobacco abuse     Medications:  Medications Prior to Admission  Medication Sig Dispense  Refill Last Dose  . acetaminophen (TYLENOL) 500 MG tablet Take 500 mg by mouth every 6 (six) hours as needed for mild pain or moderate pain.    Past Week at Unknown time  . albuterol (PROVENTIL HFA;VENTOLIN HFA) 108 (90 BASE) MCG/ACT inhaler Inhale 2 puffs into the lungs every 6 (six) hours as needed. For shortness of breath   unknown  . albuterol (PROVENTIL) (2.5 MG/3ML) 0.083% nebulizer solution Take 2.5 mg by nebulization every 6 (six) hours as needed. For shortness of breath   10/16/2018 at Unknown time  . carvedilol (COREG) 3.125 MG tablet Take 1 tablet (3.125 mg total) by mouth 2 (two) times daily with a meal. 60 tablet 0 10/15/2018 at 2300  . ferrous sulfate 325 (65 FE) MG EC tablet Take 325 mg by mouth 2 (two) times daily.   10/15/2018 at Unknown time  . furosemide (LASIX) 40 MG tablet Take 1 tablet (40 mg total) by mouth daily. (Patient taking differently: Take 40-60 mg by mouth 2 (two) times daily. Takes 1 tablet in am and 0.5 tablet at night) 30 tablet 0 10/15/2018 at Unknown time  . insulin glargine (LANTUS)  100 UNIT/ML injection Inject 20 Units into the skin daily as needed (for high blood sugar).    Past Month at Unknown time  . Lactobacillus (ACIDOPHOLUS PO) Take 1 tablet by mouth daily.   10/15/2018 at Unknown time  . magnesium oxide (MAG-OX) 400 MG tablet Take 400 mg by mouth 2 (two) times daily.   10/15/2018 at Unknown time  . Multiple Vitamin (MULTIVITAMIN) tablet Take 1 tablet by mouth daily.   10/15/2018 at Unknown time  . nitroGLYCERIN (NITROSTAT) 0.4 MG SL tablet Place 1 tablet (0.4 mg total) under the tongue every 5 (five) minutes x 3 doses as needed. For chest pain 25 tablet 3 Past Week at Unknown time  . ondansetron (ZOFRAN) 4 MG tablet Take 4 mg by mouth 4 (four) times daily as needed for nausea or vomiting.   Past Week at Unknown time  . pantoprazole (PROTONIX) 40 MG tablet Take 1 tablet (40 mg total) by mouth daily before breakfast. 30 tablet 12 10/15/2018 at Unknown time  .  primidone (MYSOLINE) 50 MG tablet Take 50 mg by mouth daily.    10/15/2018 at Unknown time  . sertraline (ZOLOFT) 50 MG tablet Take 50 mg by mouth daily.   10/15/2018 at Unknown time  . simvastatin (ZOCOR) 40 MG tablet Take 40 mg by mouth at bedtime.    10/15/2018 at Unknown time  . warfarin (COUMADIN) 4 MG tablet Take 3 tablets daily or as directed (Patient taking differently: Take 12 mg by mouth every evening. ) 260 tablet 3 10/15/2018 at 2300`    Assessment: Pharmacy consulted to dose warfarin in patient with atrial fibrillation.  Patient's home dose of warfarin is listed as 12 mg daily.  INR supratherapeutic today.  Goal of Therapy:  INR 2-3 Monitor platelets by anticoagulation protocol: Yes   Plan:  Hold warfarin x 1 dose Monitor daily INR and s/s of bleeding  Ramond Craver 10/17/2018,8:25 AM

## 2018-10-17 NOTE — Care Management Note (Addendum)
Case Management Note  Patient Details  Name: [name] MRN: [id number] Date of Birth: 04/08/1956  Subjective/Objective:    Admitted with CHF. Pt from home, ind with ADL's. Pt has insurance and PCP. Pt reports compliance with medications. Has pack of Mt Dews on his bed side table. Reports non-compliance with daily weights but has scale and says he plans to start weighing daily. He was at [facility] last year and referred to [facility]. Pt says he did not feel their services were needed. Pt still does not want [facility] services for disease management. Pt is on [facility] registry but not active. No [facility] referral seen in recent past on chart review. CM will make referral for dietician education.                Action/Plan: DC home with self care when ready. CM will make referral for [facility] CM. Pt may be more open to telephonic relationship.   Expected Discharge Date:      10/19/2018            Expected Discharge Plan:  Home/Self Care  In-House Referral:  NA  Discharge planning Services  CM Consult  Status of Service:  Completed, signed off  If discussed at [place] of Stay Meetings, dates discussed:    Additional Comments:  Sherald Barge, RN 10/17/2018, 3:14 PM

## 2018-10-17 NOTE — Progress Notes (Signed)
Paged MD regarding patients troponin level of 0.05. previous troponin level was 0.06. no new orders at this time. Will continue to monitor throughout shift.

## 2018-10-18 ENCOUNTER — Encounter: Payer: Self-pay | Admitting: Internal Medicine

## 2018-10-18 LAB — BASIC METABOLIC PANEL
Anion gap: 9 (ref 5–15)
BUN: 34 mg/dL — ABNORMAL HIGH (ref 8–23)
CALCIUM: 8.4 mg/dL — AB (ref 8.9–10.3)
CO2: 29 mmol/L (ref 22–32)
Chloride: 96 mmol/L — ABNORMAL LOW (ref 98–111)
Creatinine, Ser: 1.5 mg/dL — ABNORMAL HIGH (ref 0.61–1.24)
GFR calc non Af Amer: 49 mL/min — ABNORMAL LOW (ref >60)
GFR, EST AFRICAN AMERICAN: 57 mL/min — AB (ref >60)
Glucose, Bld: 203 mg/dL — ABNORMAL HIGH (ref 70–99)
Potassium: 4.3 mmol/L (ref 3.5–5.1)
Sodium: 134 mmol/L — ABNORMAL LOW (ref 135–145)

## 2018-10-18 LAB — CBC
HEMATOCRIT: 25.8 % — AB (ref 39.0–52.0)
Hemoglobin: 7.9 g/dL — ABNORMAL LOW (ref 13.0–17.0)
MCH: 29.4 pg (ref 26.0–34.0)
MCHC: 30.6 g/dL (ref 30.0–36.0)
MCV: 95.9 fL (ref 80.0–100.0)
NRBC: 0 % (ref 0.0–0.2)
Platelets: 188 10*3/uL (ref 150–400)
RBC: 2.69 MIL/uL — ABNORMAL LOW (ref 4.22–5.81)
RDW: 16.3 % — ABNORMAL HIGH (ref 11.5–15.5)
WBC: 6.9 10*3/uL (ref 4.0–10.5)

## 2018-10-18 LAB — IRON AND TIBC
Iron: 24 ug/dL — ABNORMAL LOW (ref 45–182)
Saturation Ratios: 8 % — ABNORMAL LOW (ref 17.9–39.5)
TIBC: 308 ug/dL (ref 250–450)
UIBC: 284 ug/dL

## 2018-10-18 LAB — GLUCOSE, CAPILLARY
Glucose-Capillary: 186 mg/dL — ABNORMAL HIGH (ref 70–99)
Glucose-Capillary: 265 mg/dL — ABNORMAL HIGH (ref 70–99)

## 2018-10-18 LAB — PROTIME-INR
INR: 2.64
Prothrombin Time: 27.8 seconds — ABNORMAL HIGH (ref 11.4–15.2)

## 2018-10-18 LAB — FERRITIN: Ferritin: 125 ng/mL (ref 24–336)

## 2018-10-18 NOTE — Progress Notes (Signed)
Nutrition Brief Note  RD received consult to assess status and provide CHF education.   Patient has a hx of COPD, HTN, DM-2, Anemia and CHF.Patinent presents with shortness of breath. At baseline per MD note and is being discharged.   Patient alone in room on RD arrival. Informed patient of purpose of visit.   Wt Readings from Last 15 Encounters:  10/18/18 79.5 kg  10/02/18 78.5 kg  09/07/18 79 kg  08/10/18 79.1 kg  03/08/18 79.1 kg  07/22/17 82.8 kg  04/27/17 83 kg  03/31/17 88.2 kg  03/25/17 86.6 kg  12/09/16 83.5 kg  09/17/16 82.1 kg  08/30/16 80.7 kg  07/26/16 78.9 kg  07/12/16 85.3 kg  07/06/16 85.7 kg   No significant changes in weight noted. Usual range 78-83 kg for more than 1 year. Body mass index is 23.77 kg/m. Patient meets criteria for normal based on current BMI.   Excellent appetite. Current diet order is Heart Healthy/CHO Modified, patient is consuming approximately 100% of meals at this time. He politely declines CHF education review and says " there's no need- I am going to eat whatever I want to". Patient did allow RD to leave a copy of handout -Nutrition Therapy for-Heart Failure with him.   Labs and medications reviewed.  BMP Latest Ref Rng & Units 10/18/2018 10/17/2018 10/16/2018  Glucose 70 - 99 mg/dL 203(H) 195(H) 266(H)  BUN 8 - 23 mg/dL 34(H) 27(H) 27(H)  Creatinine 0.61 - 1.24 mg/dL 1.50(H) 1.36(H) 1.47(H)  Sodium 135 - 145 mmol/L 134(L) 138 135  Potassium 3.5 - 5.1 mmol/L 4.3 4.3 5.2(H)  Chloride 98 - 111 mmol/L 96(L) 101 97(L)  CO2 22 - 32 mmol/L 29 29 27   Calcium 8.9 - 10.3 mg/dL 8.4(L) 8.4(L) 8.5(L)     No further nutrition interventions warranted at this time.    Colman Cater MS,RD,CSG,LDN Office: [phone] Pager: [phone]

## 2018-10-18 NOTE — Progress Notes (Signed)
IV removed and discharge instructions reviewed. Follow up appts reviewed, as well.  Transported by wc to car.Friend to drive home

## 2018-10-18 NOTE — Progress Notes (Signed)
Subjective: He says he feels better and wants to go home.  His hemoglobin level is 7.9 this morning and he was around 10 in December.  I think he was dehydrated at that point.  He does have a history of colon cancer and he has significant cardiac disease so would like for his hemoglobin level to be higher than that.  He is however asymptomatic.  Objective: Vital signs in last 24 hours: Temp:  [98.1 F (36.7 C)-98.4 F (36.9 C)] 98.4 F (36.9 C) (01/15 0543) Pulse Rate:  [66-84] 84 (01/15 0543) BP: (136-161)/(58-77) 161/77 (01/15 0543) SpO2:  [91 %-97 %] 91 % (01/15 0732) Weight:  [79.5 kg] 79.5 kg (01/15 0500) Weight change: 2.388 kg Last BM Date: 10/15/18  Intake/Output from previous day: 01/14 0701 - 01/15 0700 In: 480 [P.O.:480] Out: 700 [Urine:700]  PHYSICAL EXAM General appearance: alert, cooperative and no distress Resp: clear to auscultation bilaterally Cardio: regular rate and rhythm, S1, S2 normal, no murmur, click, rub or gallop GI: soft, non-tender; bowel sounds normal; no masses,  no organomegaly Extremities: extremities normal, atraumatic, no cyanosis or edema  Lab Results:  Results for orders placed or performed during the hospital encounter of 10/16/18 (from the past 48 hour(s))  Comprehensive metabolic panel     Status: Abnormal   Collection Time: 10/16/18  3:36 PM  Result Value Ref Range   Sodium 135 135 - 145 mmol/L   Potassium 5.2 (H) 3.5 - 5.1 mmol/L   Chloride 97 (L) 98 - 111 mmol/L   CO2 27 22 - 32 mmol/L   Glucose, Bld 266 (H) 70 - 99 mg/dL   BUN 27 (H) 8 - 23 mg/dL   Creatinine, Ser 1.47 (H) 0.61 - 1.24 mg/dL   Calcium 8.5 (L) 8.9 - 10.3 mg/dL   Total Protein 7.1 6.5 - 8.1 g/dL   Albumin 3.6 3.5 - 5.0 g/dL   AST 28 15 - 41 U/L   ALT 27 0 - 44 U/L   Alkaline Phosphatase 162 (H) 38 - 126 U/L   Total Bilirubin 0.9 0.3 - 1.2 mg/dL   GFR calc non Af Amer 50 (L) >60 mL/min   GFR calc Af Amer 58 (L) >60 mL/min   Anion gap 11 5 - 15    Comment:  Performed at [facility], [address], [place], [place] [address]  Brain natriuretic peptide     Status: Abnormal   Collection Time: 10/16/18  3:36 PM  Result Value Ref Range   B Natriuretic Peptide 2,147.0 (H) 0.0 - 100.0 pg/mL    Comment: Performed at [facility], [address], [place], [place] [address]  CBC with Differential     Status: Abnormal   Collection Time: 10/16/18  3:36 PM  Result Value Ref Range   WBC 7.5 4.0 - 10.5 K/uL   RBC 3.22 (L) 4.22 - 5.81 MIL/uL   Hemoglobin 9.5 (L) 13.0 - 17.0 g/dL   HCT 31.2 (L) 39.0 - 52.0 %   MCV 96.9 80.0 - 100.0 fL   MCH 29.5 26.0 - 34.0 pg   MCHC 30.4 30.0 - 36.0 g/dL   RDW 16.5 (H) 11.5 - 15.5 %   Platelets 228 150 - 400 K/uL   nRBC 0.0 0.0 - 0.2 %   Neutrophils Relative % 94 %   Neutro Abs 7.1 1.7 - 7.7 K/uL   Lymphocytes Relative 3 %   Lymphs Abs 0.2 (L) 0.7 - 4.0 K/uL   Monocytes Relative 2 %  Monocytes Absolute 0.1 0.1 - 1.0 K/uL   Eosinophils Relative 0 %   Eosinophils Absolute 0.0 0.0 - 0.5 K/uL   Basophils Relative 1 %   Basophils Absolute 0.0 0.0 - 0.1 K/uL   Immature Granulocytes 0 %   Abs Immature Granulocytes 0.03 0.00 - 0.07 K/uL    Comment: Performed at [facility], [address] [place], [place] [address]  Protime-INR     Status: Abnormal   Collection Time: 10/16/18  3:36 PM  Result Value Ref Range   Prothrombin Time 28.2 (H) 11.4 - 15.2 seconds   INR 2.69     Comment: Performed at [facility], [address] [place], [place] [address]  Troponin I - ONCE - STAT     Status: Abnormal   Collection Time: 10/16/18  3:37 PM  Result Value Ref Range   Troponin I 0.06 (HH) <0.03 ng/mL    Comment: CRITICAL RESULT CALLED TO, READ BACK BY AND VERIFIED WITH: ELLIS,C ON 10/16/18 AT 1640 BY LOY,C Performed at [facility], [address], [place], [place] [address]   Glucose, capillary     Status: Abnormal   Collection Time: 10/16/18  8:27 PM  Result Value Ref Range   Glucose-Capillary 270 (H) 70 - 99 mg/dL   Troponin I - Now Then Q6H     Status: Abnormal   Collection Time: 10/16/18  9:17 PM  Result Value Ref Range   Troponin I 0.06 (HH) <0.03 ng/mL    Comment: CRITICAL VALUE NOTED.  VALUE IS CONSISTENT WITH PREVIOUSLY REPORTED AND CALLED VALUE. Performed at [facility], [address], [place], [place] [address]   Basic metabolic panel     Status: Abnormal   Collection Time: 10/17/18  3:56 AM  Result Value Ref Range   Sodium 138 135 - 145 mmol/L   Potassium 4.3 3.5 - 5.1 mmol/L    Comment: DELTA CHECK NOTED   Chloride 101 98 - 111 mmol/L   CO2 29 22 - 32 mmol/L   Glucose, Bld 195 (H) 70 - 99 mg/dL   BUN 27 (H) 8 - 23 mg/dL   Creatinine, Ser 1.36 (H) 0.61 - 1.24 mg/dL   Calcium 8.4 (L) 8.9 - 10.3 mg/dL   GFR calc non Af Amer 55 (L) >60 mL/min   GFR calc Af Amer >60 >60 mL/min   Anion gap 8 5 - 15    Comment: Performed at [facility], [address], [place], [place] [address]  CBC     Status: Abnormal   Collection Time: 10/17/18  3:56 AM  Result Value Ref Range   WBC 4.4 4.0 - 10.5 K/uL   RBC 2.57 (L) 4.22 - 5.81 MIL/uL   Hemoglobin 7.6 (L) 13.0 - 17.0 g/dL   HCT 24.8 (L) 39.0 - 52.0 %   MCV 96.5 80.0 - 100.0 fL   MCH 29.6 26.0 - 34.0 pg   MCHC 30.6 30.0 - 36.0 g/dL   RDW 16.3 (H) 11.5 - 15.5 %   Platelets 178 150 - 400 K/uL   nRBC 0.0 0.0 - 0.2 %    Comment: Performed at [facility], [address] [place], [place] [address]  Troponin I - Now Then Q6H     Status: Abnormal   Collection Time: 10/17/18  3:56 AM  Result Value Ref Range   Troponin I 0.05 (HH) <0.03 ng/mL    Comment: CRITICAL VALUE NOTED.  VALUE IS CONSISTENT WITH PREVIOUSLY REPORTED AND CALLED VALUE. Performed at [facility],  [address] [place], [place] [address]   Protime-INR     Status: Abnormal   Collection Time: 10/17/18  3:56 AM  Result Value Ref Range   Prothrombin Time 33.2 (H) 11.4 - 15.2 seconds   INR 3.32     Comment: Performed at [facility], [address] [place], [place] [address]   Glucose, capillary     Status: Abnormal   Collection Time: 10/17/18  7:39 AM  Result Value Ref Range   Glucose-Capillary 165 (H) 70 - 99 mg/dL  Glucose, capillary     Status: Abnormal   Collection Time: 10/17/18 11:18 AM  Result Value Ref Range   Glucose-Capillary 235 (H) 70 - 99 mg/dL  Glucose, capillary     Status: Abnormal   Collection Time: 10/17/18  4:53 PM  Result Value Ref Range   Glucose-Capillary 191 (H) 70 - 99 mg/dL  Glucose, capillary     Status: Abnormal   Collection Time: 10/17/18  9:26 PM  Result Value Ref Range   Glucose-Capillary 345 (H) 70 - 99 mg/dL  Protime-INR     Status: Abnormal   Collection Time: 10/18/18  5:55 AM  Result Value Ref Range   Prothrombin Time 27.8 (H) 11.4 - 15.2 seconds   INR 2.64     Comment: Performed at [facility], [address], [place], [place] [address]  Basic metabolic panel     Status: Abnormal   Collection Time: 10/18/18  5:55 AM  Result Value Ref Range   Sodium 134 (L) 135 - 145 mmol/L   Potassium 4.3 3.5 - 5.1 mmol/L   Chloride 96 (L) 98 - 111 mmol/L   CO2 29 22 - 32 mmol/L   Glucose, Bld 203 (H) 70 - 99 mg/dL   BUN 34 (H) 8 - 23 mg/dL   Creatinine, Ser 1.50 (H) 0.61 - 1.24 mg/dL   Calcium 8.4 (L) 8.9 - 10.3 mg/dL   GFR calc non Af Amer 49 (L) >60 mL/min   GFR calc Af Amer 57 (L) >60 mL/min   Anion gap 9 5 - 15    Comment: Performed at [facility], [address], [place], [place] [address]  CBC     Status: Abnormal   Collection Time: 10/18/18  5:55 AM  Result Value Ref Range   WBC 6.9 4.0 - 10.5 K/uL   RBC 2.69 (L) 4.22 - 5.81 MIL/uL   Hemoglobin 7.9 (L) 13.0 - 17.0 g/dL   HCT 25.8 (L) 39.0 - 52.0 %   MCV 95.9 80.0 - 100.0 fL   MCH 29.4 26.0 - 34.0 pg   MCHC 30.6 30.0 - 36.0 g/dL   RDW 16.3 (H) 11.5 - 15.5 %   Platelets 188 150 - 400 K/uL   nRBC 0.0 0.0 - 0.2 %    Comment: Performed at [facility], [address] [place], [place] [address]  Glucose, capillary     Status: Abnormal   Collection Time: 10/18/18   7:29 AM  Result Value Ref Range   Glucose-Capillary 186 (H) 70 - 99 mg/dL    ABGS No results for input(s): PHART, PO2ART, TCO2, HCO3 in the last 72 hours.  Invalid input(s): PCO2 CULTURES No results found for this or any previous visit (from the past 240 hour(s)). Studies/Results: Dg Chest 2 View  Result Date: 10/16/2018 CLINICAL DATA:  Shortness of breath. EXAM: CHEST - 2 VIEW COMPARISON:  Chest x-ray dated September 07, 2018. FINDINGS: Unchanged left chest wall pacemaker. Stable mild cardiomegaly. Progressive diffuse interstitial  thickening. Right greater than left lower lobe atelectasis. Unchanged small right pleural effusion. No pneumothorax. No acute osseous abnormality. IMPRESSION: 1. Progressive mild diffuse interstitial pulmonary edema. 2. Unchanged small right pleural effusion. Electronically Signed   By: Titus Dubin M.D.   On: 10/16/2018 16:22    Medications:  Prior to Admission:  Medications Prior to Admission  Medication Sig Dispense Refill Last Dose  . acetaminophen (TYLENOL) 500 MG tablet Take 500 mg by mouth every 6 (six) hours as needed for mild pain or moderate pain.    Past Week at Unknown time  . albuterol (PROVENTIL HFA;VENTOLIN HFA) 108 (90 BASE) MCG/ACT inhaler Inhale 2 puffs into the lungs every 6 (six) hours as needed. For shortness of breath   unknown  . albuterol (PROVENTIL) (2.5 MG/3ML) 0.083% nebulizer solution Take 2.5 mg by nebulization every 6 (six) hours as needed. For shortness of breath   10/16/2018 at Unknown time  . carvedilol (COREG) 3.125 MG tablet Take 1 tablet (3.125 mg total) by mouth 2 (two) times daily with a meal. 60 tablet 0 10/15/2018 at 2300  . ferrous sulfate 325 (65 FE) MG EC tablet Take 325 mg by mouth 2 (two) times daily.   10/15/2018 at Unknown time  . furosemide (LASIX) 40 MG tablet Take 1 tablet (40 mg total) by mouth daily. (Patient taking differently: Take 40-60 mg by mouth 2 (two) times daily. Takes 1 tablet in am and 0.5 tablet at  night) 30 tablet 0 10/15/2018 at Unknown time  . insulin glargine (LANTUS) 100 UNIT/ML injection Inject 20 Units into the skin daily as needed (for high blood sugar).    Past Month at Unknown time  . Lactobacillus (ACIDOPHOLUS PO) Take 1 tablet by mouth daily.   10/15/2018 at Unknown time  . magnesium oxide (MAG-OX) 400 MG tablet Take 400 mg by mouth 2 (two) times daily.   10/15/2018 at Unknown time  . Multiple Vitamin (MULTIVITAMIN) tablet Take 1 tablet by mouth daily.   10/15/2018 at Unknown time  . nitroGLYCERIN (NITROSTAT) 0.4 MG SL tablet Place 1 tablet (0.4 mg total) under the tongue every 5 (five) minutes x 3 doses as needed. For chest pain 25 tablet 3 Past Week at Unknown time  . ondansetron (ZOFRAN) 4 MG tablet Take 4 mg by mouth 4 (four) times daily as needed for nausea or vomiting.   Past Week at Unknown time  . pantoprazole (PROTONIX) 40 MG tablet Take 1 tablet (40 mg total) by mouth daily before breakfast. 30 tablet 12 10/15/2018 at Unknown time  . primidone (MYSOLINE) 50 MG tablet Take 50 mg by mouth daily.    10/15/2018 at Unknown time  . sertraline (ZOLOFT) 50 MG tablet Take 50 mg by mouth daily.   10/15/2018 at Unknown time  . simvastatin (ZOCOR) 40 MG tablet Take 40 mg by mouth at bedtime.    10/15/2018 at Unknown time  . warfarin (COUMADIN) 4 MG tablet Take 3 tablets daily or as directed (Patient taking differently: Take 12 mg by mouth every evening. ) 260 tablet 3 10/15/2018 at 2300`   Scheduled: . carvedilol  3.125 mg Oral BID WC  . ferrous sulfate  325 mg Oral BID  . furosemide  40 mg Intravenous BID  . insulin aspart  0-5 Units Subcutaneous QHS  . insulin aspart  0-9 Units Subcutaneous TID WC  . ipratropium-albuterol  3 mL Nebulization Q6H  . multivitamin with minerals  1 tablet Oral Daily  . pantoprazole  40 mg Oral QAC  breakfast  . sertraline  50 mg Oral Daily  . simvastatin  40 mg Oral QHS  . sodium chloride flush  3 mL Intravenous Q12H  . Warfarin - Pharmacist Dosing  Inpatient   Does not apply q1800   Continuous:  PTE:[id number] **OR** acetaminophen, albuterol, hydrALAZINE  Assesment: He was admitted with acute on chronic combined systolic and diastolic heart failure but he is better.  I think he can go home. Principal Problem:   Acute on chronic combined systolic (congestive) and diastolic (congestive) heart failure (HCC) Active Problems:   COPD (chronic obstructive pulmonary disease) (HCC)   Diabetes mellitus, type II (HCC)   Hypertension   Hyperlipidemia   Tobacco abuse   Atrial fibrillation (HCC)   Hyperkalemia    Plan: Discharge home today.  I have requested iron total iron-binding capacity and ferritin before he leaves    LOS: 2 days   Alonza Bogus 10/18/2018, 8:15 AM

## 2018-10-18 NOTE — Discharge Summary (Signed)
Physician Discharge Summary  Patient ID: [name] MRN: [id number] DOB/AGE: [birth year] 63 y.o. Primary Care Physician:Quintyn Dombek, Percell Miller, MD Admit date: 10/16/2018 Discharge date: 10/18/2018    Discharge Diagnoses:   Principal Problem:   Acute on chronic combined systolic (congestive) and diastolic (congestive) heart failure (HCC) Active Problems:   COPD (chronic obstructive pulmonary disease) (HCC)   Diabetes mellitus, type II (HCC)   Hypertension   Hyperlipidemia   Tobacco abuse   Atrial fibrillation (HCC)   Hyperkalemia Anemia  Allergies as of 10/18/2018      Reactions   Vancomycin Itching, Rash      Medication List    TAKE these medications   acetaminophen 500 MG tablet Commonly known as:  TYLENOL Take 500 mg by mouth every 6 (six) hours as needed for mild pain or moderate pain.   ACIDOPHOLUS PO Take 1 tablet by mouth daily.   albuterol (2.5 MG/3ML) 0.083% nebulizer solution Commonly known as:  PROVENTIL Take 2.5 mg by nebulization every 6 (six) hours as needed. For shortness of breath   albuterol 108 (90 Base) MCG/ACT inhaler Commonly known as:  PROVENTIL HFA;VENTOLIN HFA Inhale 2 puffs into the lungs every 6 (six) hours as needed. For shortness of breath   carvedilol 3.125 MG tablet Commonly known as:  COREG Take 1 tablet (3.125 mg total) by mouth 2 (two) times daily with a meal.   ferrous sulfate 325 (65 FE) MG EC tablet Take 325 mg by mouth 2 (two) times daily.   furosemide 40 MG tablet Commonly known as:  LASIX Take 1 tablet (40 mg total) by mouth daily. What changed:    how much to take  when to take this  additional instructions   insulin glargine 100 UNIT/ML injection Commonly known as:  LANTUS Inject 20 Units into the skin daily as needed (for high blood sugar).   magnesium oxide 400 MG tablet Commonly known as:  MAG-OX Take 400 mg by mouth 2 (two) times daily.   multivitamin tablet Take 1 tablet by mouth daily.   nitroGLYCERIN  0.4 MG SL tablet Commonly known as:  NITROSTAT Place 1 tablet (0.4 mg total) under the tongue every 5 (five) minutes x 3 doses as needed. For chest pain   ondansetron 4 MG tablet Commonly known as:  ZOFRAN Take 4 mg by mouth 4 (four) times daily as needed for nausea or vomiting.   pantoprazole 40 MG tablet Commonly known as:  PROTONIX Take 1 tablet (40 mg total) by mouth daily before breakfast.   primidone 50 MG tablet Commonly known as:  MYSOLINE Take 50 mg by mouth daily.   sertraline 50 MG tablet Commonly known as:  ZOLOFT Take 50 mg by mouth daily.   simvastatin 40 MG tablet Commonly known as:  ZOCOR Take 40 mg by mouth at bedtime.   warfarin 4 MG tablet Commonly known as:  COUMADIN Take as directed. If you are unsure how to take this medication, talk to your nurse or doctor. Original instructions:  Take 3 tablets daily or as directed What changed:    how much to take  how to take this  when to take this  additional instructions       Discharged Condition: Improved    Consults: None  Significant Diagnostic Studies: Dg Chest 2 View  Result Date: 10/16/2018 CLINICAL DATA:  Shortness of breath. EXAM: CHEST - 2 VIEW COMPARISON:  Chest x-ray dated September 07, 2018. FINDINGS: Unchanged left chest wall pacemaker. Stable mild cardiomegaly. Progressive diffuse  interstitial thickening. Right greater than left lower lobe atelectasis. Unchanged small right pleural effusion. No pneumothorax. No acute osseous abnormality. IMPRESSION: 1. Progressive mild diffuse interstitial pulmonary edema. 2. Unchanged small right pleural effusion. Electronically Signed   By: Titus Dubin M.D.   On: 10/16/2018 16:22    Lab Results: Basic Metabolic Panel: Recent Labs    10/17/18 0356 10/18/18 0555  NA 138 134*  K 4.3 4.3  CL 101 96*  CO2 29 29  GLUCOSE 195* 203*  BUN 27* 34*  CREATININE 1.36* 1.50*  CALCIUM 8.4* 8.4*   Liver Function Tests: Recent Labs    10/16/18 1536   AST 28  ALT 27  ALKPHOS 162*  BILITOT 0.9  PROT 7.1  ALBUMIN 3.6     CBC: Recent Labs    10/16/18 1536 10/17/18 0356 10/18/18 0555  WBC 7.5 4.4 6.9  NEUTROABS 7.1  --   --   HGB 9.5* 7.6* 7.9*  HCT 31.2* 24.8* 25.8*  MCV 96.9 96.5 95.9  PLT 228 178 188    No results found for this or any previous visit (from the past 240 hour(s)).   Hospital Course: This is a 63 year old with a long known history of coronary disease heart failure COPD atrial fib chronic anticoagulation history of colon cancer and who came to the emergency room with increasing shortness of breath.  He was found to have heart failure and was started on intravenous Lasix.  He improved over the next 48 hours.  His hemoglobin level was down and that is going to be addressed as an outpatient.  He had iron total iron-binding capacity and ferritin done which are not back yet.  He is back at his baseline and ready for discharge.  He does not need a blood transfusion now  Discharge Exam: Blood pressure (!) 161/77, pulse 84, temperature 98.4 F (36.9 C), temperature source Oral, resp. rate (!) 25, height 6' (1.829 m), weight 79.5 kg, SpO2 91 %. He is awake and alert.  His lungs are much clearer.  He is in atrial fib.  He has chronic venous stasis of both legs  Disposition: Home he has an appointment to see me in about 2 weeks.      Signed: Alonza Bogus   10/18/2018, 8:19 AM

## 2018-10-24 ENCOUNTER — Other Ambulatory Visit: Payer: Self-pay

## 2018-10-24 NOTE — Patient Outreach (Signed)
[facility]) Care Management  10/24/2018  BARKLEY KRATOCHVIL [birth year] [id number]   Unsuccessful outreach attempt. Phone rang several times without option to leave voice message.    PLAN Will send unsuccessful outreach letter. Will follow up in 3-4 business days.  [place] [phone]

## 2018-10-25 ENCOUNTER — Ambulatory Visit (INDEPENDENT_AMBULATORY_CARE_PROVIDER_SITE_OTHER): Payer: Medicare HMO | Admitting: Pharmacist

## 2018-10-25 DIAGNOSIS — Z5181 Encounter for therapeutic drug level monitoring: Secondary | ICD-10-CM

## 2018-10-25 DIAGNOSIS — I4891 Unspecified atrial fibrillation: Secondary | ICD-10-CM

## 2018-10-25 LAB — POCT INR: INR: 2.8 (ref 2.0–3.0)

## 2018-10-25 NOTE — Patient Instructions (Signed)
Description   Continue coumadin 3 tablets daily Continue greens Recheck in 3 weeks

## 2018-10-27 ENCOUNTER — Other Ambulatory Visit: Payer: Self-pay

## 2018-10-27 NOTE — Patient Outreach (Signed)
[name]) Care Management  10/27/2018  [name] Dec 20, 1955 [id number]   Attempted outreach with [name]. Call answered by individual that stated he was not in at the time of the call. Provided RNCM contact number and left HIPAA compliant message requesting a call when he returns.  PLAN Will follow up in 3-4 business days.  [place] [phone]

## 2018-10-29 LAB — CUP PACEART REMOTE DEVICE CHECK
Battery Remaining Longevity: 60 mo
Brady Statistic RA Percent Paced: 60 %
Brady Statistic RV Percent Paced: 100 %
Date Time Interrogation Session: 20191216050000
HighPow Impedance: 72 Ohm
Implantable Lead Implant Date: 20140509
Implantable Lead Implant Date: 20140509
Implantable Lead Location: 753859
Implantable Lead Location: 753860
Implantable Lead Model: 293
Implantable Lead Model: 4136
Implantable Lead Model: 4555
Implantable Lead Serial Number: 118610
Implantable Lead Serial Number: 207706
Implantable Lead Serial Number: 29354050
Implantable Pulse Generator Implant Date: 20140509
Lead Channel Impedance Value: 1346 Ohm
Lead Channel Impedance Value: 709 Ohm
Lead Channel Pacing Threshold Amplitude: 0.7 V
Lead Channel Pacing Threshold Amplitude: 0.7 V
Lead Channel Pacing Threshold Amplitude: 0.7 V
Lead Channel Pacing Threshold Pulse Width: 0.4 ms
Lead Channel Pacing Threshold Pulse Width: 0.4 ms
Lead Channel Pacing Threshold Pulse Width: 1 ms
Lead Channel Setting Pacing Amplitude: 2 V
Lead Channel Setting Pacing Amplitude: 2 V
Lead Channel Setting Pacing Pulse Width: 0.4 ms
Lead Channel Setting Sensing Sensitivity: 0.6 mV
Lead Channel Setting Sensing Sensitivity: 1 mV
MDC IDC LEAD IMPLANT DT: 20140509
MDC IDC LEAD LOCATION: 753858
MDC IDC MSMT BATTERY REMAINING PERCENTAGE: 98 %
MDC IDC MSMT LEADCHNL RV IMPEDANCE VALUE: 606 Ohm
MDC IDC SET LEADCHNL LV PACING PULSEWIDTH: 1 ms
MDC IDC SET LEADCHNL RV PACING AMPLITUDE: 2.5 V
Pulse Gen Serial Number: 950230

## 2018-10-31 ENCOUNTER — Other Ambulatory Visit: Payer: Self-pay

## 2018-11-02 NOTE — Patient Outreach (Signed)
[place] [facility]) Care Management   [name] 07-15-1956 [id number]   Successful outreach with [name]. Member declined [facility] services. Reported that he was doing well and opted not to participate at this time. Agreed to contact his primary care provider if his health status changes and [facility] services are needed.  PLAN Will complete case closure.  [place] [phone]

## 2018-11-06 DIAGNOSIS — J9611 Chronic respiratory failure with hypoxia: Secondary | ICD-10-CM | POA: Diagnosis not present

## 2018-11-06 DIAGNOSIS — I5043 Acute on chronic combined systolic (congestive) and diastolic (congestive) heart failure: Secondary | ICD-10-CM | POA: Diagnosis not present

## 2018-11-06 DIAGNOSIS — J449 Chronic obstructive pulmonary disease, unspecified: Secondary | ICD-10-CM | POA: Diagnosis not present

## 2018-11-06 DIAGNOSIS — E1165 Type 2 diabetes mellitus with hyperglycemia: Secondary | ICD-10-CM | POA: Diagnosis not present

## 2018-11-09 ENCOUNTER — Other Ambulatory Visit (HOSPITAL_COMMUNITY)
Admission: RE | Admit: 2018-11-09 | Discharge: 2018-11-09 | Disposition: A | Discharge status: Home / Self Care | Payer: Medicare HMO | Source: Ambulatory Visit | Attending: Internal Medicine | Admitting: Internal Medicine

## 2018-11-09 ENCOUNTER — Encounter: Payer: Self-pay | Admitting: Internal Medicine

## 2018-11-09 ENCOUNTER — Ambulatory Visit (INDEPENDENT_AMBULATORY_CARE_PROVIDER_SITE_OTHER): Payer: Medicare HMO | Admitting: Internal Medicine

## 2018-11-09 VITALS — BP 138/74 | HR 82 | Ht 72.0 in | Wt 195.0 lb

## 2018-11-09 DIAGNOSIS — Z79899 Other long term (current) drug therapy: Secondary | ICD-10-CM

## 2018-11-09 DIAGNOSIS — I5022 Chronic systolic (congestive) heart failure: Secondary | ICD-10-CM | POA: Diagnosis not present

## 2018-11-09 DIAGNOSIS — I255 Ischemic cardiomyopathy: Secondary | ICD-10-CM

## 2018-11-09 LAB — BASIC METABOLIC PANEL
Anion gap: 8 (ref 5–15)
BUN: 33 mg/dL — ABNORMAL HIGH (ref 8–23)
CO2: 29 mmol/L (ref 22–32)
Calcium: 8.6 mg/dL — ABNORMAL LOW (ref 8.9–10.3)
Chloride: 100 mmol/L (ref 98–111)
Creatinine, Ser: 1.58 mg/dL — ABNORMAL HIGH (ref 0.61–1.24)
GFR calc Af Amer: 54 mL/min — ABNORMAL LOW (ref >60)
GFR calc non Af Amer: 46 mL/min — ABNORMAL LOW (ref >60)
Glucose, Bld: 160 mg/dL — ABNORMAL HIGH (ref 70–99)
Potassium: 5.3 mmol/L — ABNORMAL HIGH (ref 3.5–5.1)
Sodium: 137 mmol/L (ref 135–145)

## 2018-11-09 LAB — MAGNESIUM: Magnesium: 2.3 mg/dL (ref 1.7–2.4)

## 2018-11-09 MED ORDER — TORSEMIDE 20 MG PO TABS: 20.0000 mg | ORAL_TABLET | Freq: Two times a day (BID) | ORAL | 3 refills | Status: DC

## 2018-11-09 NOTE — Patient Instructions (Signed)
Medication Instructions:  Your physician has recommended you make the following change in your medication:  Stop Taking Lasix  Start Taking Torsemide 20 mg Two Times Daily ( Take in the morning and at lunch)  Take Metolazone 30 mins prior to Taking Torsemide   If you need a refill on your cardiac medications before your next appointment, please call your pharmacy.   Lab work: Your physician recommends that you return for lab work in: Today   If you have labs (blood work) drawn today and your tests are completely normal, you will receive your results only by: [name] MyChart Message (if you have MyChart) OR . A paper copy in the mail If you have any lab test that is abnormal or we need to change your treatment, we will call you to review the results.  Testing/Procedures: NONE   Follow-Up: At [facility], you and your health needs are our priority.  As part of our continuing mission to provide you with exceptional heart care, we have created designated Provider Care Teams.  These Care Teams include your primary Cardiologist (physician) and Advanced Practice Providers (APPs -  Physician Assistants and Nurse Practitioners) who all work together to provide you with the care you need, when you need it. You will need a follow up appointment in 1 weeks.  Please call our office 2 months in advance to schedule this appointment.  You may see Cristopher Peru, MD or one of the following Advanced Practice Providers on your designated Care Team:   Chanetta Marshall, NP . Tommye Standard, PA-C  Any Other Special Instructions Will Be Listed Below (If Applicable). Thank you for choosing [facility]!

## 2018-11-09 NOTE — Progress Notes (Signed)
HPI [name] returns today for followup. He is a pleasant 63 yo man with an ICM, CHB, s/p BiV ICD, who has developed atrial fib/flutter in the interim. He is asymptomatic. He denies chest pain or sob. No edema or palpitations. He c/o numbness in his legs from neuropathy. On remote monitoring it has been noted that his LV pacing impedence was increasing but has since plateaued.He has not been in the hospital since his last visit. He is still smoking. He has had progressive peripheral edema and sob over the past several weeks. He has seen Dr. Luan Pulling and was started on metolazone. He has taken 2 doses. He has scrotal edema.   Allergies  Allergen Reactions  . Vancomycin Itching and Rash     Current Outpatient Medications  Medication Sig Dispense Refill  . acetaminophen (TYLENOL) 500 MG tablet Take 500 mg by mouth every 6 (six) hours as needed for mild pain or moderate pain.     [name] albuterol (PROVENTIL HFA;VENTOLIN HFA) 108 (90 BASE) MCG/ACT inhaler Inhale 2 puffs into the lungs every 6 (six) hours as needed. For shortness of breath    . albuterol (PROVENTIL) (2.5 MG/3ML) 0.083% nebulizer solution Take 2.5 mg by nebulization every 6 (six) hours as needed. For shortness of breath    . carvedilol (COREG) 3.125 MG tablet Take 1 tablet (3.125 mg total) by mouth 2 (two) times daily with a meal. 60 tablet 0  . ferrous sulfate 325 (65 FE) MG EC tablet Take 325 mg by mouth 2 (two) times daily.    . furosemide (LASIX) 40 MG tablet Take 1 tablet (40 mg total) by mouth daily. (Patient taking differently: Take 40-60 mg by mouth 2 (two) times daily. Takes 1 tablet in am and 0.5 tablet at night) 30 tablet 0  . insulin glargine (LANTUS) 100 UNIT/ML injection Inject 20 Units into the skin daily as needed (for high blood sugar).     . Lactobacillus (ACIDOPHOLUS PO) Take 1 tablet by mouth daily.    . magnesium oxide (MAG-OX) 400 MG tablet Take 400 mg by mouth 2 (two) times daily.    . metolazone  (ZAROXOLYN) 2.5 MG tablet     . Multiple Vitamin (MULTIVITAMIN) tablet Take 1 tablet by mouth daily.    . nitroGLYCERIN (NITROSTAT) 0.4 MG SL tablet Place 1 tablet (0.4 mg total) under the tongue every 5 (five) minutes x 3 doses as needed. For chest pain 25 tablet 3  . ondansetron (ZOFRAN) 4 MG tablet Take 4 mg by mouth 4 (four) times daily as needed for nausea or vomiting.    . pantoprazole (PROTONIX) 40 MG tablet Take 1 tablet (40 mg total) by mouth daily before breakfast. 30 tablet 12  . primidone (MYSOLINE) 50 MG tablet Take 50 mg by mouth daily.     . sertraline (ZOLOFT) 50 MG tablet Take 50 mg by mouth daily.    . simvastatin (ZOCOR) 40 MG tablet Take 40 mg by mouth at bedtime.     [name] warfarin (COUMADIN) 4 MG tablet Take 3 tablets daily or as directed (Patient taking differently: Take 12 mg by mouth every evening. ) 260 tablet 3   No current facility-administered medications for this visit.      Past Medical History:  Diagnosis Date  . A-fib ([place])   . Anemia, normocytic normochromic 2008   2008  . Arteriosclerotic cardiovascular disease (ASCVD)    Acute MI in 2006->  LAD stent, EF-37%  . Arthritis  Hx: of in hands  . Automatic implantable cardioverter-defibrillator in situ   . Carpal tunnel syndrome   . Chronic systolic (congestive) heart failure (HCC)    a. EF 20-25% by echo in 03/2017  . Colon cancer ([place]) 2006   Partial colectomy in 2006  . Complete heart block ([place])    S/P ICD 02/09/13  . COPD (chronic obstructive pulmonary disease) ([place])   . Coronary artery disease   . Diabetes mellitus, type II ([place])    With peripheral neuropathy  . Edema    venous insufficiency  . H/O alcohol dependence ([place])   . H/O hiatal hernia   . Hyperlipidemia   . Hypertension   . Myocardial infarction [facility])    Hx: of 2006  . Neuropathy in diabetes ([place])    Hx: of  . Pacemaker   . Pneumonia 01/2012   01/2012; subsequent admission for chest pain  . Shortness of breath    Hx: of   .  Tobacco abuse     ROS:   All systems reviewed and negative except as noted in the HPI.   Past Surgical History:  Procedure Laterality Date  . ANKLE SURGERY     Right  . Biventricular ICD Placement  02/09/2013   Dr. Lovena Le  . COLONOSCOPY  08/17/2005   Pancolonic diverticula/Multiple rectal polyps removed as described above. The larger of the two likely responsible for intermittent hematochezia/ Multiple polyps at the hepatic flexure resected with a snare./ Large polypoid lesion growing out of the base of the cecum, just behind the  ileocecal valve. This was not felt to be amendable to endoscopic resection. Biopsied multiple times  . CORONARY ANGIOPLASTY WITH STENT PLACEMENT    . ESOPHAGOGASTRODUODENOSCOPY N/A 03/29/2017   Barrett's esophagus, gastritis, duodenitis, focal glandular atypia on biopsy. Needs surveillance June 2019   . FLEXIBLE SIGMOIDOSCOPY    06/13/2006   Essentially normal residual rectum status post total colectomy. anastomosis at 25cm.  Focal area of abnormality adjacent to suture, as described above, likely not significant, suspect granulation tissue, biopsied.  Anastomosis otherwise appeared normal.  Distal terminal ileum appeared normal  . FLEXIBLE SIGMOIDOSCOPY N/A 01/13/2015   Dr. Gala Romney: normal-appearing residual rectum, surgical anastomosis, s/p subtotal colectomy. Surveillance 5 years   . FLEXIBLE SIGMOIDOSCOPY N/A 03/29/2017   Dr. Oneida Alar while inpatient: intermittent rectal bleeding due to ischemic erosion at anastomosis,s/p APC therapy. one 3 mm polyp in recum (hyperplastic). surveillance in 3 years  . INGUINAL HERNIA REPAIR    . MULTIPLE EXTRACTIONS WITH ALVEOLOPLASTY N/A 04/09/2013   Procedure: Extraction of tooth #'s 1,2,4,5,6,7,8,9,10,11,12,13,17,18,20,21,22,23,27,28, 29,30,31, and 32 with alveoloplasty.;  Surgeon: Lenn Cal, DDS;  Location: [place];  Service: Oral Surgery;  Laterality: N/A;  . PERMANENT PACEMAKER INSERTION N/A 02/09/2013   Procedure: PERMANENT  PACEMAKER INSERTION;  Surgeon: Evans Lance, MD;  Location[facility] CATH LAB;  Service: Cardiovascular;  Laterality: N/A;  . SUBTOTAL COLECTOMY  09/01/2005   subtotal colectomy  . TEMPORARY PACEMAKER INSERTION Right 02/09/2013   Procedure: TEMPORARY PACEMAKER INSERTION;  Surgeon: Sinclair Grooms, MD;  Location: [facility] CATH LAB;  Service: Cardiovascular;  Laterality: Right;     Family History  Problem Relation Age of Onset  . Colon cancer Mother        Terrell Ostrand, ?>age 34.  [name] Heart disease Mother   . Heart disease Father   . Hypertension Brother   . Heart disease Brother   . Diabetes Brother   . Liver disease Neg Hx  Social History   Socioeconomic History  . Marital status: Married    Spouse name: Not on file  . Number of children: 1  . Years of education: Not on file  . Highest education level: Not on file  Occupational History  . Occupation: Writer    Comment: Disabled  Social Needs  . Financial resource strain: Not on file  . Food insecurity:    Worry: Not on file    Inability: Not on file  . Transportation needs:    Medical: Not on file    Non-medical: Not on file  Tobacco Use  . Smoking status: Current Every Day Smoker    Packs/day: 1.00    Years: 40.00    Pack years: 40.00    Types: Cigarettes    Start date: 07/13/1971  . Smokeless tobacco: Never Used  Substance and Sexual Activity  . Alcohol use: No    Alcohol/week: 0.0 standard drinks  . Drug use: No  . Sexual activity: Never  Lifestyle  . Physical activity:    Days per week: Not on file    Minutes per session: Not on file  . Stress: Not on file  Relationships  . Social connections:    Talks on phone: Not on file    Gets together: Not on file    Attends religious service: Not on file    Active member of club or organization: Not on file    Attends meetings of clubs or organizations: Not on file    Relationship status: Not on file  . Intimate partner violence:    Fear of current or ex  partner: Not on file    Emotionally abused: Not on file    Physically abused: Not on file    Forced sexual activity: Not on file  Other Topics Concern  . Not on file  Social History Narrative  . Not on file     BP 138/74   Pulse 82   Ht 6' (1.829 m)   Wt 195 lb (88.5 kg)   SpO2 90%   BMI 26.45 kg/m   Physical Exam:  Chronically ill appearing NAD HEENT: Unremarkable Neck:  7 cm JVD, no thyromegally Lymphatics:  No adenopathy Back:  No CVA tenderness Lungs:  Clear with no wheezes HEART:  Regular rate rhythm, no murmurs, no rubs, no clicks Abd:  soft, positive bowel sounds, no organomegally, no rebound, no guarding Ext:  2 plus pulses, no edema, no cyanosis, no clubbing Skin:  No rashes no nodules Neuro:  CN II through XII intact, motor grossly intact  DEVICE  Normal device function.  See PaceArt for details.   Assess/Plan: 1. Acute on chronic systolic heart failure - his symptoms have worsened. He has had 2 days of metolazone. I will ask him to stop the lasix and start torsemide 20 mg bid and ask him to take the metolazone 30 minutes before the torsemide. He will maintain a low sodium diet. 2. ICM - he denies anginal symptoms.  3. Tobacco abuse - he is still smoking and is encouraged to stop. 4. ICD - he has an elevated pacing impedence in the LV lead which is stable.   [name].D.

## 2018-11-13 DIAGNOSIS — I5042 Chronic combined systolic (congestive) and diastolic (congestive) heart failure: Secondary | ICD-10-CM | POA: Diagnosis not present

## 2018-11-13 DIAGNOSIS — I25119 Atherosclerotic heart disease of native coronary artery with unspecified angina pectoris: Secondary | ICD-10-CM | POA: Diagnosis not present

## 2018-11-13 DIAGNOSIS — J9611 Chronic respiratory failure with hypoxia: Secondary | ICD-10-CM | POA: Diagnosis not present

## 2018-11-13 DIAGNOSIS — J449 Chronic obstructive pulmonary disease, unspecified: Secondary | ICD-10-CM | POA: Diagnosis not present

## 2018-11-14 ENCOUNTER — Ambulatory Visit: Payer: Medicare HMO | Admitting: Gastroenterology

## 2018-11-15 ENCOUNTER — Other Ambulatory Visit (HOSPITAL_COMMUNITY)
Admission: RE | Admit: 2018-11-15 | Discharge: 2018-11-15 | Disposition: A | Discharge status: Home / Self Care | Payer: Medicare HMO | Source: Ambulatory Visit | Attending: Internal Medicine | Admitting: Internal Medicine

## 2018-11-15 ENCOUNTER — Ambulatory Visit (INDEPENDENT_AMBULATORY_CARE_PROVIDER_SITE_OTHER): Payer: Medicare HMO | Admitting: Internal Medicine

## 2018-11-15 ENCOUNTER — Encounter: Payer: Self-pay | Admitting: Internal Medicine

## 2018-11-15 ENCOUNTER — Ambulatory Visit: Payer: Self-pay | Admitting: Internal Medicine

## 2018-11-15 ENCOUNTER — Ambulatory Visit (INDEPENDENT_AMBULATORY_CARE_PROVIDER_SITE_OTHER): Payer: Medicare HMO | Admitting: Pharmacist

## 2018-11-15 VITALS — BP 130/68 | HR 84 | Ht 72.0 in | Wt 193.0 lb

## 2018-11-15 DIAGNOSIS — Z95810 Presence of automatic (implantable) cardiac defibrillator: Secondary | ICD-10-CM | POA: Diagnosis not present

## 2018-11-15 DIAGNOSIS — I5022 Chronic systolic (congestive) heart failure: Secondary | ICD-10-CM | POA: Diagnosis not present

## 2018-11-15 DIAGNOSIS — I255 Ischemic cardiomyopathy: Secondary | ICD-10-CM

## 2018-11-15 DIAGNOSIS — I4891 Unspecified atrial fibrillation: Secondary | ICD-10-CM

## 2018-11-15 DIAGNOSIS — Z5181 Encounter for therapeutic drug level monitoring: Secondary | ICD-10-CM | POA: Diagnosis not present

## 2018-11-15 LAB — BASIC METABOLIC PANEL
Anion gap: 9 (ref 5–15)
BUN: 35 mg/dL — ABNORMAL HIGH (ref 8–23)
CO2: 31 mmol/L (ref 22–32)
Calcium: 8.5 mg/dL — ABNORMAL LOW (ref 8.9–10.3)
Chloride: 95 mmol/L — ABNORMAL LOW (ref 98–111)
Creatinine, Ser: 1.63 mg/dL — ABNORMAL HIGH (ref 0.61–1.24)
GFR calc Af Amer: 52 mL/min — ABNORMAL LOW (ref >60)
GFR calc non Af Amer: 44 mL/min — ABNORMAL LOW (ref >60)
Glucose, Bld: 235 mg/dL — ABNORMAL HIGH (ref 70–99)
Potassium: 4.6 mmol/L (ref 3.5–5.1)
Sodium: 135 mmol/L (ref 135–145)

## 2018-11-15 LAB — POCT INR: INR: 4.5 — AB (ref 2.0–3.0)

## 2018-11-15 MED ORDER — TORSEMIDE 20 MG PO TABS: ORAL_TABLET | ORAL | 3 refills | Status: DC

## 2018-11-15 MED ORDER — METOLAZONE 2.5 MG PO TABS: ORAL_TABLET | ORAL | 3 refills | Status: DC

## 2018-11-15 NOTE — Patient Instructions (Signed)
Description   No Coumadin today then change dose to coumadin 3 tablets daily, except 2 tablets on Wednesdays.  Continue greens Recheck in 3 weeks

## 2018-11-15 NOTE — Patient Instructions (Addendum)
Medication Instructions:  TAKE TORSEMIDE 40 MG (2 TABLETS) IN THE AM  TAKE TORSEMIDE 20 MG (1 TABLET) AFTER LUNCH Take metolazone 2.5 mg 30 minutes prior to taking torsemide on Monday, Wednesday & Friday.)  Labwork: BMP   Testing/Procedures: NONE  Follow-Up: Your physician recommends that you schedule a follow-up appointment in: Tuesday February 18TH    Any Other Special Instructions Will Be Listed Below (If Applicable).  Remote monitoring is used to monitor your Pacemaker of ICD from home. This monitoring reduces the number of office visits required to check your device to one time per year. It allows [place] to keep an eye on the functioning of your device to ensure it is working properly. You are scheduled for a device check from home on December 18, 2018. You may send your transmission at any time that day. If you have a wireless device, the transmission will be sent automatically. After your physician reviews your transmission, you will receive a postcard with your next transmission date.     If you need a refill on your cardiac medications before your next appointment, please call your pharmacy.

## 2018-11-15 NOTE — Progress Notes (Signed)
HPI [name] returns today for followup. He is a pleasant 63yo man with an ICM, CHB, s/p BiV ICD, who has developed worsening CHF. I saw him last week and I switch him to torsemide and asked him to take his metolazone 30 minutes before his torsemide and 3 times a week. In the interim, he feels a little bit better. His edema is still present but a bit improved. His dyspnea is a little better.  Allergies  Allergen Reactions  . Vancomycin Itching and Rash     Current Outpatient Medications  Medication Sig Dispense Refill  . acetaminophen (TYLENOL) 500 MG tablet Take 500 mg by mouth every 6 (six) hours as needed for mild pain or moderate pain.     [name] albuterol (PROVENTIL HFA;VENTOLIN HFA) 108 (90 BASE) MCG/ACT inhaler Inhale 2 puffs into the lungs every 6 (six) hours as needed. For shortness of breath    . albuterol (PROVENTIL) (2.5 MG/3ML) 0.083% nebulizer solution Take 2.5 mg by nebulization every 6 (six) hours as needed. For shortness of breath    . carvedilol (COREG) 3.125 MG tablet Take 1 tablet (3.125 mg total) by mouth 2 (two) times daily with a meal. 60 tablet 0  . ferrous sulfate 325 (65 FE) MG EC tablet Take 325 mg by mouth 2 (two) times daily.    . insulin glargine (LANTUS) 100 UNIT/ML injection Inject 20 Units into the skin daily as needed (for high blood sugar).     . Lactobacillus (ACIDOPHOLUS PO) Take 1 tablet by mouth daily.    . magnesium oxide (MAG-OX) 400 MG tablet Take 400 mg by mouth 2 (two) times daily.    . metolazone (ZAROXOLYN) 2.5 MG tablet     . Multiple Vitamin (MULTIVITAMIN) tablet Take 1 tablet by mouth daily.    . nitroGLYCERIN (NITROSTAT) 0.4 MG SL tablet Place 1 tablet (0.4 mg total) under the tongue every 5 (five) minutes x 3 doses as needed. For chest pain 25 tablet 3  . ondansetron (ZOFRAN) 4 MG tablet Take 4 mg by mouth 4 (four) times daily as needed for nausea or vomiting.    . pantoprazole (PROTONIX) 40 MG tablet Take 1 tablet (40 mg total) by  mouth daily before breakfast. 30 tablet 12  . primidone (MYSOLINE) 50 MG tablet Take 50 mg by mouth daily.     . sertraline (ZOLOFT) 50 MG tablet Take 50 mg by mouth daily.    . simvastatin (ZOCOR) 40 MG tablet Take 40 mg by mouth at bedtime.     . torsemide (DEMADEX) 20 MG tablet Take 1 tablet (20 mg total) by mouth 2 (two) times daily. Take Metolazone 30 min prior to taking Torsemide 180 tablet 3  . warfarin (COUMADIN) 4 MG tablet Take 3 tablets daily or as directed (Patient taking differently: Take 12 mg by mouth every evening. ) 260 tablet 3   No current facility-administered medications for this visit.      Past Medical History:  Diagnosis Date  . A-fib ([place])   . Anemia, normocytic normochromic 2008   2008  . Arteriosclerotic cardiovascular disease (ASCVD)    Acute MI in 2006->  LAD stent, EF-37%  . Arthritis    Hx: of in hands  . Automatic implantable cardioverter-defibrillator in situ   . Carpal tunnel syndrome   . Chronic systolic (congestive) heart failure (HCC)    a. EF 20-25% by echo in 03/2017  . Colon cancer ([place]) 2006   Partial colectomy in  2006  . Complete heart block ([place])    S/P ICD 02/09/13  . COPD (chronic obstructive pulmonary disease) ([place])   . Coronary artery disease   . Diabetes mellitus, type II ([place])    With peripheral neuropathy  . Edema    venous insufficiency  . H/O alcohol dependence ([place])   . H/O hiatal hernia   . Hyperlipidemia   . Hypertension   . Myocardial infarction [facility])    Hx: of 2006  . Neuropathy in diabetes ([place])    Hx: of  . Pacemaker   . Pneumonia 01/2012   01/2012; subsequent admission for chest pain  . Shortness of breath    Hx: of   . Tobacco abuse     ROS:   All systems reviewed and negative except as noted in the HPI.   Past Surgical History:  Procedure Laterality Date  . ANKLE SURGERY     Right  . Biventricular ICD Placement  02/09/2013   Dr. Lovena Le  . COLONOSCOPY  08/17/2005   Pancolonic diverticula/Multiple  rectal polyps removed as described above. The larger of the two likely responsible for intermittent hematochezia/ Multiple polyps at the hepatic flexure resected with a snare./ Large polypoid lesion growing out of the base of the cecum, just behind the  ileocecal valve. This was not felt to be amendable to endoscopic resection. Biopsied multiple times  . CORONARY ANGIOPLASTY WITH STENT PLACEMENT    . ESOPHAGOGASTRODUODENOSCOPY N/A 03/29/2017   Barrett's esophagus, gastritis, duodenitis, focal glandular atypia on biopsy. Needs surveillance June 2019   . FLEXIBLE SIGMOIDOSCOPY    06/13/2006   Essentially normal residual rectum status post total colectomy. anastomosis at 25cm.  Focal area of abnormality adjacent to suture, as described above, likely not significant, suspect granulation tissue, biopsied.  Anastomosis otherwise appeared normal.  Distal terminal ileum appeared normal  . FLEXIBLE SIGMOIDOSCOPY N/A 01/13/2015   Dr. Gala Romney: normal-appearing residual rectum, surgical anastomosis, s/p subtotal colectomy. Surveillance 5 years   . FLEXIBLE SIGMOIDOSCOPY N/A 03/29/2017   Dr. Oneida Alar while inpatient: intermittent rectal bleeding due to ischemic erosion at anastomosis,s/p APC therapy. one 3 mm polyp in recum (hyperplastic). surveillance in 3 years  . INGUINAL HERNIA REPAIR    . MULTIPLE EXTRACTIONS WITH ALVEOLOPLASTY N/A 04/09/2013   Procedure: Extraction of tooth #'s 1,2,4,5,6,7,8,9,10,11,12,13,17,18,20,21,22,23,27,28, 29,30,31, and 32 with alveoloplasty.;  Surgeon: Lenn Cal, DDS;  Location: [place];  Service: Oral Surgery;  Laterality: N/A;  . PERMANENT PACEMAKER INSERTION N/A 02/09/2013   Procedure: PERMANENT PACEMAKER INSERTION;  Surgeon: Evans Lance, MD;  Location: [facility] CATH LAB;  Service: Cardiovascular;  Laterality: N/A;  . SUBTOTAL COLECTOMY  09/01/2005   subtotal colectomy  . TEMPORARY PACEMAKER INSERTION Right 02/09/2013   Procedure: TEMPORARY PACEMAKER INSERTION;  Surgeon: Sinclair Grooms, MD;  Location[facility] CATH LAB;  Service: Cardiovascular;  Laterality: Right;     Family History  Problem Relation Age of Onset  . Colon cancer Mother        Tayden Nichelson, ?>age 90.  [name] Heart disease Mother   . Heart disease Father   . Hypertension Brother   . Heart disease Brother   . Diabetes Brother   . Liver disease Neg Hx      Social History   Socioeconomic History  . Marital status: Married    Spouse name: Not on file  . Number of children: 1  . Years of education: Not on file  . Highest education level: Not on file  Occupational History  .  Occupation: Writer    Comment: Disabled  Social Needs  . Financial resource strain: Not on file  . Food insecurity:    Worry: Not on file    Inability: Not on file  . Transportation needs:    Medical: Not on file    Non-medical: Not on file  Tobacco Use  . Smoking status: Former Smoker    Packs/day: 1.00    Years: 40.00    Pack years: 40.00    Types: Cigarettes    Start date: 07/13/1971    Last attempt to quit: 11/13/2018  . Smokeless tobacco: Never Used  Substance and Sexual Activity  . Alcohol use: No    Alcohol/week: 0.0 standard drinks  . Drug use: No  . Sexual activity: Never  Lifestyle  . Physical activity:    Days per week: Not on file    Minutes per session: Not on file  . Stress: Not on file  Relationships  . Social connections:    Talks on phone: Not on file    Gets together: Not on file    Attends religious service: Not on file    Active member of club or organization: Not on file    Attends meetings of clubs or organizations: Not on file    Relationship status: Not on file  . Intimate partner violence:    Fear of current or ex partner: Not on file    Emotionally abused: Not on file    Physically abused: Not on file    Forced sexual activity: Not on file  Other Topics Concern  . Not on file  Social History Narrative  . Not on file     BP 130/68 (BP Location: Left Arm)   Pulse 84    Ht 6' (1.829 m)   Wt 193 lb (87.5 kg)   SpO2 91%   BMI 26.18 kg/m   Physical Exam:  chronically ill appearing 63 yo man, NAD HEENT: Unremarkable Neck:  6 cm JVD, no thyromegally Lymphatics:  No adenopathy Back:  No CVA tenderness Lungs:  Clear with no wheezes HEART:  Regular rate rhythm, no murmurs, no rubs, no clicks Abd:  soft, positive bowel sounds, no organomegally, no rebound, no guarding Ext:  2 plus pulses, no edema, no cyanosis, no clubbing Skin:  No rashes no nodules Neuro:  CN II through XII intact, motor grossly intact  DEVICE  Normal device function.  See PaceArt for details.   Assess/Plan: 1. Chronic systolic heart failure - he is a bit better on the torsemide. He will increase to 40 in a.m and 20 in p.m. He will continue metolazone 2.5 mg M/W/F.  2. Tobacco abuse - he has not smoked for 5 days. I have strongly encouraged him to stop smoking. 3. CAD - he denies anginal symptoms.   Mikle Bosworth.D.

## 2018-11-20 ENCOUNTER — Telehealth: Payer: Self-pay | Admitting: *Deleted

## 2018-11-20 NOTE — Telephone Encounter (Signed)
-----   Message from Evans Lance, MD sent at 11/18/2018  7:59 PM EST ----- Renal function is stable. Continue toresemide and take metolazone m/w/f.

## 2018-11-20 NOTE — Telephone Encounter (Signed)
Called patient with test results. Left message to call back.  

## 2018-11-21 ENCOUNTER — Ambulatory Visit (INDEPENDENT_AMBULATORY_CARE_PROVIDER_SITE_OTHER): Payer: Medicare HMO | Admitting: Internal Medicine

## 2018-11-21 ENCOUNTER — Other Ambulatory Visit (HOSPITAL_COMMUNITY)
Admission: RE | Admit: 2018-11-21 | Discharge: 2018-11-21 | Disposition: A | Discharge status: Home / Self Care | Payer: Medicare HMO | Source: Ambulatory Visit | Attending: Internal Medicine | Admitting: Internal Medicine

## 2018-11-21 ENCOUNTER — Encounter: Payer: Self-pay | Admitting: Internal Medicine

## 2018-11-21 VITALS — BP 124/60 | HR 64 | Ht 72.0 in | Wt 195.8 lb

## 2018-11-21 DIAGNOSIS — I5022 Chronic systolic (congestive) heart failure: Secondary | ICD-10-CM | POA: Insufficient documentation

## 2018-11-21 LAB — BASIC METABOLIC PANEL
Anion gap: 8 (ref 5–15)
BUN: 37 mg/dL — ABNORMAL HIGH (ref 8–23)
CO2: 31 mmol/L (ref 22–32)
Calcium: 8.5 mg/dL — ABNORMAL LOW (ref 8.9–10.3)
Chloride: 94 mmol/L — ABNORMAL LOW (ref 98–111)
Creatinine, Ser: 1.87 mg/dL — ABNORMAL HIGH (ref 0.61–1.24)
GFR calc Af Amer: 44 mL/min — ABNORMAL LOW (ref >60)
GFR, EST NON AFRICAN AMERICAN: 38 mL/min — AB (ref >60)
Glucose, Bld: 289 mg/dL — ABNORMAL HIGH (ref 70–99)
Potassium: 4.6 mmol/L (ref 3.5–5.1)
Sodium: 133 mmol/L — ABNORMAL LOW (ref 135–145)

## 2018-11-21 MED ORDER — TORSEMIDE 20 MG PO TABS: 40.0000 mg | ORAL_TABLET | Freq: Two times a day (BID) | ORAL | 3 refills | Status: DC

## 2018-11-21 NOTE — Progress Notes (Signed)
HPI Mr. Capistran returns today for evaluation of acute on chronic systolic heart failure. He has a longstanding ICM, severe CHF, who has had worsening symptoms. I have been adjusting his medical therapy over the past few weeks. He denies syncope. No ICD shock. He admits to sodium excess but is trying to do better. Allergies  Allergen Reactions  . Vancomycin Itching and Rash     Current Outpatient Medications  Medication Sig Dispense Refill  . acetaminophen (TYLENOL) 500 MG tablet Take 500 mg by mouth every 6 (six) hours as needed for mild pain or moderate pain.     [name] albuterol (PROVENTIL HFA;VENTOLIN HFA) 108 (90 BASE) MCG/ACT inhaler Inhale 2 puffs into the lungs every 6 (six) hours as needed. For shortness of breath    . albuterol (PROVENTIL) (2.5 MG/3ML) 0.083% nebulizer solution Take 2.5 mg by nebulization every 6 (six) hours as needed. For shortness of breath    . carvedilol (COREG) 3.125 MG tablet Take 1 tablet (3.125 mg total) by mouth 2 (two) times daily with a meal. 60 tablet 0  . ferrous sulfate 325 (65 FE) MG EC tablet Take 325 mg by mouth 2 (two) times daily.    . insulin glargine (LANTUS) 100 UNIT/ML injection Inject 20 Units into the skin daily as needed (for high blood sugar).     . Lactobacillus (ACIDOPHOLUS PO) Take 1 tablet by mouth daily.    . magnesium oxide (MAG-OX) 400 MG tablet Take 400 mg by mouth 2 (two) times daily.    . metolazone (ZAROXOLYN) 2.5 MG tablet Take 2.5 mg (1 tablet) on Monday, Wednesday & Friday 30 minutes prior to taking torsemide. 30 tablet 3  . Multiple Vitamin (MULTIVITAMIN) tablet Take 1 tablet by mouth daily.    . nitroGLYCERIN (NITROSTAT) 0.4 MG SL tablet Place 1 tablet (0.4 mg total) under the tongue every 5 (five) minutes x 3 doses as needed. For chest pain 25 tablet 3  . ondansetron (ZOFRAN) 4 MG tablet Take 4 mg by mouth 4 (four) times daily as needed for nausea or vomiting.    . pantoprazole (PROTONIX) 40 MG tablet Take 1 tablet (40 mg  total) by mouth daily before breakfast. 30 tablet 12  . primidone (MYSOLINE) 50 MG tablet Take 50 mg by mouth daily.     . sertraline (ZOLOFT) 50 MG tablet Take 50 mg by mouth daily.    . simvastatin (ZOCOR) 40 MG tablet Take 40 mg by mouth at bedtime.     . torsemide (DEMADEX) 20 MG tablet Take 40 mg in the morning, take 20 mg right after lunch daily. Take Metolazone 30 min prior to taking Torsemide 90 tablet 3  . warfarin (COUMADIN) 4 MG tablet Take 3 tablets daily or as directed (Patient taking differently: Take 12 mg by mouth every evening. ) 260 tablet 3   No current facility-administered medications for this visit.      Past Medical History:  Diagnosis Date  . A-fib ([place])   . Anemia, normocytic normochromic 2008   2008  . Arteriosclerotic cardiovascular disease (ASCVD)    Acute MI in 2006->  LAD stent, EF-37%  . Arthritis    Hx: of in hands  . Automatic implantable cardioverter-defibrillator in situ   . Carpal tunnel syndrome   . Chronic systolic (congestive) heart failure (HCC)    a. EF 20-25% by echo in 03/2017  . Colon cancer ([place]) 2006   Partial colectomy in 2006  . Complete heart block (  [place])    S/P ICD 02/09/13  . COPD (chronic obstructive pulmonary disease) ([place])   . Coronary artery disease   . Diabetes mellitus, type II ([place])    With peripheral neuropathy  . Edema    venous insufficiency  . H/O alcohol dependence ([place])   . H/O hiatal hernia   . Hyperlipidemia   . Hypertension   . Myocardial infarction [facility])    Hx: of 2006  . Neuropathy in diabetes ([place])    Hx: of  . Pacemaker   . Pneumonia 01/2012   01/2012; subsequent admission for chest pain  . Shortness of breath    Hx: of   . Tobacco abuse     ROS:   All systems reviewed and negative except as noted in the HPI.   Past Surgical History:  Procedure Laterality Date  . ANKLE SURGERY     Right  . Biventricular ICD Placement  02/09/2013   Dr. Lovena Le  . COLONOSCOPY  08/17/2005   Pancolonic  diverticula/Multiple rectal polyps removed as described above. The larger of the two likely responsible for intermittent hematochezia/ Multiple polyps at the hepatic flexure resected with a snare./ Large polypoid lesion growing out of the base of the cecum, just behind the  ileocecal valve. This was not felt to be amendable to endoscopic resection. Biopsied multiple times  . CORONARY ANGIOPLASTY WITH STENT PLACEMENT    . ESOPHAGOGASTRODUODENOSCOPY N/A 03/29/2017   Barrett's esophagus, gastritis, duodenitis, focal glandular atypia on biopsy. Needs surveillance June 2019   . FLEXIBLE SIGMOIDOSCOPY    06/13/2006   Essentially normal residual rectum status post total colectomy. anastomosis at 25cm.  Focal area of abnormality adjacent to suture, as described above, likely not significant, suspect granulation tissue, biopsied.  Anastomosis otherwise appeared normal.  Distal terminal ileum appeared normal  . FLEXIBLE SIGMOIDOSCOPY N/A 01/13/2015   Dr. Gala Romney: normal-appearing residual rectum, surgical anastomosis, s/p subtotal colectomy. Surveillance 5 years   . FLEXIBLE SIGMOIDOSCOPY N/A 03/29/2017   Dr. Oneida Alar while inpatient: intermittent rectal bleeding due to ischemic erosion at anastomosis,s/p APC therapy. one 3 mm polyp in recum (hyperplastic). surveillance in 3 years  . INGUINAL HERNIA REPAIR    . MULTIPLE EXTRACTIONS WITH ALVEOLOPLASTY N/A 04/09/2013   Procedure: Extraction of tooth #'s 1,2,4,5,6,7,8,9,10,11,12,13,17,18,20,21,22,23,27,28, 29,30,31, and 32 with alveoloplasty.;  Surgeon: Lenn Cal, DDS;  Location: [facility];  Service: Oral Surgery;  Laterality: N/A;  . PERMANENT PACEMAKER INSERTION N/A 02/09/2013   Procedure: PERMANENT PACEMAKER INSERTION;  Surgeon: Evans Lance, MD;  Location: [facility] CATH LAB;  Service: Cardiovascular;  Laterality: N/A;  . SUBTOTAL COLECTOMY  09/01/2005   subtotal colectomy  . TEMPORARY PACEMAKER INSERTION Right 02/09/2013   Procedure: TEMPORARY PACEMAKER INSERTION;   Surgeon: Sinclair Grooms, MD;  Location: [facility] CATH LAB;  Service: Cardiovascular;  Laterality: Right;     Family History  Problem Relation Age of Onset  . Colon cancer Mother        Keanthony Poole, ?>age 68.  [name] Heart disease Mother   . Heart disease Father   . Hypertension Brother   . Heart disease Brother   . Diabetes Brother   . Liver disease Neg Hx      Social History   Socioeconomic History  . Marital status: Married    Spouse name: Not on file  . Number of children: 1  . Years of education: Not on file  . Highest education level: Not on file  Occupational History  . Occupation: Writer  Comment: Disabled  Social Needs  . Financial resource strain: Not on file  . Food insecurity:    Worry: Not on file    Inability: Not on file  . Transportation needs:    Medical: Not on file    Non-medical: Not on file  Tobacco Use  . Smoking status: Current Every Day Smoker    Packs/day: 0.10    Years: 40.00    Pack years: 4.00    Types: Cigarettes    Start date: 07/13/1971    Last attempt to quit: 11/13/2018    Years since quitting: 0.0  . Smokeless tobacco: Never Used  Substance and Sexual Activity  . Alcohol use: No    Alcohol/week: 0.0 standard drinks  . Drug use: No  . Sexual activity: Never  Lifestyle  . Physical activity:    Days per week: Not on file    Minutes per session: Not on file  . Stress: Not on file  Relationships  . Social connections:    Talks on phone: Not on file    Gets together: Not on file    Attends religious service: Not on file    Active member of club or organization: Not on file    Attends meetings of clubs or organizations: Not on file    Relationship status: Not on file  . Intimate partner violence:    Fear of current or ex partner: Not on file    Emotionally abused: Not on file    Physically abused: Not on file    Forced sexual activity: Not on file  Other Topics Concern  . Not on file  Social History Narrative  . Not on  file     BP 124/60   Pulse 64   Ht 6' (1.829 m)   Wt 195 lb 12.8 oz (88.8 kg)   SpO2 90%   BMI 26.56 kg/m   Physical Exam:  Chronically ill appearing NAD HEENT: Unremarkable Neck:  8 cm JVD, no thyromegally Lymphatics:  No adenopathy Back:  No CVA tenderness Lungs:  Bilateral basilar rales HEART:  Regular rate rhythm, no murmurs, no rubs, no clicks Abd:  soft, positive bowel sounds, no organomegally, no rebound, no guarding Ext:  2 plus pulses, no edema, no cyanosis, no clubbing Skin:  No rashes no nodules Neuro:  CN II through XII intact, motor grossly intact   DEVICE  Normal device function.  See PaceArt for details.   Assess/Plan: 1. Chronic systolic heart failure -I Have been adjusting his meds but have not had much improvement. At this point he will probable need admit for IV diuresis plus/minus right heart cath but I will refer to our CHF clinic. 2. ICD - his biv ICD is working normally. 3. Acute on chronic renal failure - this limits our ability to diurese with multiple meds. I continue ot emphasize a low sodium diet.Mikle Bosworth.D.

## 2018-11-21 NOTE — Patient Instructions (Signed)
Medication Instructions:  Your physician has recommended you make the following change in your medication:  Stop Taking Coreg Today  Increase Torsemide to 40 mg Two Times Daily   If you need a refill on your cardiac medications before your next appointment, please call your pharmacy.   Lab work: Your physician recommends that you return for lab work in: Today  Your physician recommends that you return for lab work in: Just before next visit  If you have labs (blood work) drawn today and your tests are completely normal, you will receive your results only by: [name] MyChart Message (if you have MyChart) OR . A paper copy in the mail If you have any lab test that is abnormal or we need to change your treatment, we will call you to review the results.  Testing/Procedures: NONE   Follow-Up: At [facility], you and your health needs are our priority.  As part of our continuing mission to provide you with exceptional heart care, we have created designated Provider Care Teams.  These Care Teams include your primary Cardiologist (physician) and Advanced Practice Providers (APPs -  Physician Assistants and Nurse Practitioners) who all work together to provide you with the care you need, when you need it. You will need a follow up appointment in 2 weeks.  Please call our office 2 months in advance to schedule this appointment.  You may see Carlyle Dolly, MD or one of the following Advanced Practice Providers on your designated Care Team:   Bernerd Pho, PA-C [facility]) . Ermalinda Barrios, PA-C ([place])  Any Other Special Instructions Will Be Listed Below (If Applicable). Thank you for choosing [facility]!

## 2018-11-26 ENCOUNTER — Emergency Department (HOSPITAL_COMMUNITY): Payer: Medicare HMO

## 2018-11-26 ENCOUNTER — Other Ambulatory Visit: Payer: Self-pay

## 2018-11-26 ENCOUNTER — Encounter (HOSPITAL_COMMUNITY): Payer: Self-pay

## 2018-11-26 ENCOUNTER — Inpatient Hospital Stay (HOSPITAL_COMMUNITY)
Admission: EM | Admit: 2018-11-26 | Discharge: 2018-12-08 | DRG: 291 | Disposition: A | Discharge status: Hospice | Payer: Medicare HMO | Attending: Cardiology | Admitting: Cardiology

## 2018-11-26 DIAGNOSIS — J810 Acute pulmonary edema: Secondary | ICD-10-CM | POA: Diagnosis not present

## 2018-11-26 DIAGNOSIS — R7989 Other specified abnormal findings of blood chemistry: Secondary | ICD-10-CM | POA: Diagnosis present

## 2018-11-26 DIAGNOSIS — T380X5A Adverse effect of glucocorticoids and synthetic analogues, initial encounter: Secondary | ICD-10-CM | POA: Diagnosis not present

## 2018-11-26 DIAGNOSIS — N179 Acute kidney failure, unspecified: Secondary | ICD-10-CM | POA: Diagnosis present

## 2018-11-26 DIAGNOSIS — E43 Unspecified severe protein-calorie malnutrition: Secondary | ICD-10-CM

## 2018-11-26 DIAGNOSIS — D509 Iron deficiency anemia, unspecified: Secondary | ICD-10-CM | POA: Diagnosis present

## 2018-11-26 DIAGNOSIS — K2270 Barrett's esophagus without dysplasia: Secondary | ICD-10-CM | POA: Diagnosis present

## 2018-11-26 DIAGNOSIS — R05 Cough: Secondary | ICD-10-CM | POA: Diagnosis not present

## 2018-11-26 DIAGNOSIS — R069 Unspecified abnormalities of breathing: Secondary | ICD-10-CM | POA: Diagnosis not present

## 2018-11-26 DIAGNOSIS — I480 Paroxysmal atrial fibrillation: Secondary | ICD-10-CM | POA: Diagnosis present

## 2018-11-26 DIAGNOSIS — I255 Ischemic cardiomyopathy: Secondary | ICD-10-CM | POA: Diagnosis present

## 2018-11-26 DIAGNOSIS — R1030 Lower abdominal pain, unspecified: Secondary | ICD-10-CM

## 2018-11-26 DIAGNOSIS — R778 Other specified abnormalities of plasma proteins: Secondary | ICD-10-CM | POA: Diagnosis present

## 2018-11-26 DIAGNOSIS — K219 Gastro-esophageal reflux disease without esophagitis: Secondary | ICD-10-CM | POA: Diagnosis present

## 2018-11-26 DIAGNOSIS — Z515 Encounter for palliative care: Secondary | ICD-10-CM | POA: Diagnosis not present

## 2018-11-26 DIAGNOSIS — R188 Other ascites: Secondary | ICD-10-CM | POA: Diagnosis present

## 2018-11-26 DIAGNOSIS — Z01818 Encounter for other preprocedural examination: Secondary | ICD-10-CM

## 2018-11-26 DIAGNOSIS — Z85038 Personal history of other malignant neoplasm of large intestine: Secondary | ICD-10-CM

## 2018-11-26 DIAGNOSIS — I252 Old myocardial infarction: Secondary | ICD-10-CM

## 2018-11-26 DIAGNOSIS — J9621 Acute and chronic respiratory failure with hypoxia: Secondary | ICD-10-CM | POA: Diagnosis present

## 2018-11-26 DIAGNOSIS — I509 Heart failure, unspecified: Secondary | ICD-10-CM | POA: Diagnosis not present

## 2018-11-26 DIAGNOSIS — I890 Lymphedema, not elsewhere classified: Secondary | ICD-10-CM | POA: Diagnosis present

## 2018-11-26 DIAGNOSIS — Z8249 Family history of ischemic heart disease and other diseases of the circulatory system: Secondary | ICD-10-CM

## 2018-11-26 DIAGNOSIS — Y92230 Patient room in hospital as the place of occurrence of the external cause: Secondary | ICD-10-CM | POA: Diagnosis not present

## 2018-11-26 DIAGNOSIS — I428 Other cardiomyopathies: Secondary | ICD-10-CM | POA: Diagnosis present

## 2018-11-26 DIAGNOSIS — Z95810 Presence of automatic (implantable) cardiac defibrillator: Secondary | ICD-10-CM

## 2018-11-26 DIAGNOSIS — K0889 Other specified disorders of teeth and supporting structures: Secondary | ICD-10-CM | POA: Diagnosis present

## 2018-11-26 DIAGNOSIS — I130 Hypertensive heart and chronic kidney disease with heart failure and stage 1 through stage 4 chronic kidney disease, or unspecified chronic kidney disease: Secondary | ICD-10-CM | POA: Diagnosis present

## 2018-11-26 DIAGNOSIS — R531 Weakness: Secondary | ICD-10-CM | POA: Diagnosis not present

## 2018-11-26 DIAGNOSIS — R0600 Dyspnea, unspecified: Secondary | ICD-10-CM

## 2018-11-26 DIAGNOSIS — Z9049 Acquired absence of other specified parts of digestive tract: Secondary | ICD-10-CM

## 2018-11-26 DIAGNOSIS — R042 Hemoptysis: Secondary | ICD-10-CM

## 2018-11-26 DIAGNOSIS — Z452 Encounter for adjustment and management of vascular access device: Secondary | ICD-10-CM | POA: Diagnosis not present

## 2018-11-26 DIAGNOSIS — E1142 Type 2 diabetes mellitus with diabetic polyneuropathy: Secondary | ICD-10-CM | POA: Diagnosis present

## 2018-11-26 DIAGNOSIS — Z720 Tobacco use: Secondary | ICD-10-CM | POA: Diagnosis present

## 2018-11-26 DIAGNOSIS — I083 Combined rheumatic disorders of mitral, aortic and tricuspid valves: Secondary | ICD-10-CM | POA: Diagnosis present

## 2018-11-26 DIAGNOSIS — I872 Venous insufficiency (chronic) (peripheral): Secondary | ICD-10-CM | POA: Diagnosis present

## 2018-11-26 DIAGNOSIS — I2510 Atherosclerotic heart disease of native coronary artery without angina pectoris: Secondary | ICD-10-CM | POA: Diagnosis present

## 2018-11-26 DIAGNOSIS — I429 Cardiomyopathy, unspecified: Secondary | ICD-10-CM

## 2018-11-26 DIAGNOSIS — Z7401 Bed confinement status: Secondary | ICD-10-CM | POA: Diagnosis not present

## 2018-11-26 DIAGNOSIS — Z833 Family history of diabetes mellitus: Secondary | ICD-10-CM

## 2018-11-26 DIAGNOSIS — Z66 Do not resuscitate: Secondary | ICD-10-CM | POA: Diagnosis not present

## 2018-11-26 DIAGNOSIS — I447 Left bundle-branch block, unspecified: Secondary | ICD-10-CM | POA: Diagnosis not present

## 2018-11-26 DIAGNOSIS — K529 Noninfective gastroenteritis and colitis, unspecified: Secondary | ICD-10-CM | POA: Diagnosis present

## 2018-11-26 DIAGNOSIS — J90 Pleural effusion, not elsewhere classified: Secondary | ICD-10-CM

## 2018-11-26 DIAGNOSIS — I442 Atrioventricular block, complete: Secondary | ICD-10-CM | POA: Diagnosis present

## 2018-11-26 DIAGNOSIS — D649 Anemia, unspecified: Secondary | ICD-10-CM

## 2018-11-26 DIAGNOSIS — J9601 Acute respiratory failure with hypoxia: Secondary | ICD-10-CM | POA: Diagnosis not present

## 2018-11-26 DIAGNOSIS — E119 Type 2 diabetes mellitus without complications: Secondary | ICD-10-CM

## 2018-11-26 DIAGNOSIS — E1122 Type 2 diabetes mellitus with diabetic chronic kidney disease: Secondary | ICD-10-CM | POA: Diagnosis present

## 2018-11-26 DIAGNOSIS — I5043 Acute on chronic combined systolic (congestive) and diastolic (congestive) heart failure: Secondary | ICD-10-CM | POA: Diagnosis not present

## 2018-11-26 DIAGNOSIS — F419 Anxiety disorder, unspecified: Secondary | ICD-10-CM | POA: Diagnosis present

## 2018-11-26 DIAGNOSIS — M19042 Primary osteoarthritis, left hand: Secondary | ICD-10-CM | POA: Diagnosis present

## 2018-11-26 DIAGNOSIS — F17211 Nicotine dependence, cigarettes, in remission: Secondary | ICD-10-CM | POA: Diagnosis present

## 2018-11-26 DIAGNOSIS — Z95828 Presence of other vascular implants and grafts: Secondary | ICD-10-CM

## 2018-11-26 DIAGNOSIS — Z79899 Other long term (current) drug therapy: Secondary | ICD-10-CM

## 2018-11-26 DIAGNOSIS — M2550 Pain in unspecified joint: Secondary | ICD-10-CM | POA: Diagnosis not present

## 2018-11-26 DIAGNOSIS — F1011 Alcohol abuse, in remission: Secondary | ICD-10-CM | POA: Diagnosis present

## 2018-11-26 DIAGNOSIS — Z794 Long term (current) use of insulin: Secondary | ICD-10-CM

## 2018-11-26 DIAGNOSIS — J9811 Atelectasis: Secondary | ICD-10-CM | POA: Diagnosis not present

## 2018-11-26 DIAGNOSIS — Z881 Allergy status to other antibiotic agents status: Secondary | ICD-10-CM

## 2018-11-26 DIAGNOSIS — Z6824 Body mass index (BMI) 24.0-24.9, adult: Secondary | ICD-10-CM

## 2018-11-26 DIAGNOSIS — I10 Essential (primary) hypertension: Secondary | ICD-10-CM | POA: Diagnosis present

## 2018-11-26 DIAGNOSIS — I25119 Atherosclerotic heart disease of native coronary artery with unspecified angina pectoris: Secondary | ICD-10-CM | POA: Diagnosis not present

## 2018-11-26 DIAGNOSIS — I482 Chronic atrial fibrillation, unspecified: Secondary | ICD-10-CM | POA: Diagnosis present

## 2018-11-26 DIAGNOSIS — I272 Pulmonary hypertension, unspecified: Secondary | ICD-10-CM | POA: Diagnosis present

## 2018-11-26 DIAGNOSIS — I4891 Unspecified atrial fibrillation: Secondary | ICD-10-CM | POA: Diagnosis present

## 2018-11-26 DIAGNOSIS — Z9981 Dependence on supplemental oxygen: Secondary | ICD-10-CM

## 2018-11-26 DIAGNOSIS — E785 Hyperlipidemia, unspecified: Secondary | ICD-10-CM | POA: Diagnosis present

## 2018-11-26 DIAGNOSIS — Z7901 Long term (current) use of anticoagulants: Secondary | ICD-10-CM | POA: Diagnosis not present

## 2018-11-26 DIAGNOSIS — M19041 Primary osteoarthritis, right hand: Secondary | ICD-10-CM | POA: Diagnosis present

## 2018-11-26 DIAGNOSIS — N183 Chronic kidney disease, stage 3 (moderate): Secondary | ICD-10-CM | POA: Diagnosis present

## 2018-11-26 DIAGNOSIS — Z7189 Other specified counseling: Secondary | ICD-10-CM | POA: Diagnosis not present

## 2018-11-26 DIAGNOSIS — L859 Epidermal thickening, unspecified: Secondary | ICD-10-CM | POA: Diagnosis present

## 2018-11-26 DIAGNOSIS — E1165 Type 2 diabetes mellitus with hyperglycemia: Secondary | ICD-10-CM | POA: Diagnosis not present

## 2018-11-26 DIAGNOSIS — Z955 Presence of coronary angioplasty implant and graft: Secondary | ICD-10-CM

## 2018-11-26 DIAGNOSIS — Z800 Family history of malignant neoplasm of digestive organs: Secondary | ICD-10-CM

## 2018-11-26 DIAGNOSIS — J449 Chronic obstructive pulmonary disease, unspecified: Secondary | ICD-10-CM | POA: Diagnosis present

## 2018-11-26 DIAGNOSIS — Z9119 Patient's noncompliance with other medical treatment and regimen: Secondary | ICD-10-CM

## 2018-11-26 DIAGNOSIS — J439 Emphysema, unspecified: Secondary | ICD-10-CM | POA: Diagnosis present

## 2018-11-26 DIAGNOSIS — R0689 Other abnormalities of breathing: Secondary | ICD-10-CM | POA: Diagnosis not present

## 2018-11-26 DIAGNOSIS — I340 Nonrheumatic mitral (valve) insufficiency: Secondary | ICD-10-CM | POA: Diagnosis not present

## 2018-11-26 DIAGNOSIS — J441 Chronic obstructive pulmonary disease with (acute) exacerbation: Secondary | ICD-10-CM | POA: Diagnosis not present

## 2018-11-26 DIAGNOSIS — G4700 Insomnia, unspecified: Secondary | ICD-10-CM | POA: Diagnosis not present

## 2018-11-26 DIAGNOSIS — R0602 Shortness of breath: Secondary | ICD-10-CM | POA: Diagnosis present

## 2018-11-26 HISTORY — DX: Presence of automatic (implantable) cardiac defibrillator: Z95.810

## 2018-11-26 HISTORY — DX: Atherosclerotic heart disease of native coronary artery without angina pectoris: I25.10

## 2018-11-26 HISTORY — DX: Personal history of pneumonia (recurrent): Z87.01

## 2018-11-26 HISTORY — DX: Cardiomyopathy, unspecified: I42.9

## 2018-11-26 HISTORY — DX: Paroxysmal atrial fibrillation: I48.0

## 2018-11-26 LAB — TROPONIN I
Troponin I: 0.06 ng/mL (ref <0.03)
Troponin I: 0.06 ng/mL (ref <0.03)

## 2018-11-26 LAB — CBC WITH DIFFERENTIAL/PLATELET
ABS IMMATURE GRANULOCYTES: 0.01 10*3/uL (ref 0.00–0.07)
Basophils Absolute: 0 10*3/uL (ref 0.0–0.1)
Basophils Relative: 0 %
Eosinophils Absolute: 0.7 10*3/uL — ABNORMAL HIGH (ref 0.0–0.5)
Eosinophils Relative: 12 %
HCT: 31.6 % — ABNORMAL LOW (ref 39.0–52.0)
Hemoglobin: 9.5 g/dL — ABNORMAL LOW (ref 13.0–17.0)
Immature Granulocytes: 0 %
Lymphocytes Relative: 9 %
Lymphs Abs: 0.5 10*3/uL — ABNORMAL LOW (ref 0.7–4.0)
MCH: 29.5 pg (ref 26.0–34.0)
MCHC: 30.1 g/dL (ref 30.0–36.0)
MCV: 98.1 fL (ref 80.0–100.0)
Monocytes Absolute: 0.4 10*3/uL (ref 0.1–1.0)
Monocytes Relative: 7 %
Neutro Abs: 3.8 10*3/uL (ref 1.7–7.7)
Neutrophils Relative %: 72 %
Platelets: 182 10*3/uL (ref 150–400)
RBC: 3.22 MIL/uL — ABNORMAL LOW (ref 4.22–5.81)
RDW: 14.6 % (ref 11.5–15.5)
WBC: 5.3 10*3/uL (ref 4.0–10.5)
nRBC: 0 % (ref 0.0–0.2)

## 2018-11-26 LAB — PROTIME-INR
INR: 2.49
PROTHROMBIN TIME: 26.6 s — AB (ref 11.4–15.2)

## 2018-11-26 LAB — GLUCOSE, CAPILLARY: Glucose-Capillary: 256 mg/dL — ABNORMAL HIGH (ref 70–99)

## 2018-11-26 LAB — COMPREHENSIVE METABOLIC PANEL
ALT: 24 U/L (ref 0–44)
ANION GAP: 9 (ref 5–15)
AST: 26 U/L (ref 15–41)
Albumin: 3 g/dL — ABNORMAL LOW (ref 3.5–5.0)
Alkaline Phosphatase: 202 U/L — ABNORMAL HIGH (ref 38–126)
BUN: 31 mg/dL — ABNORMAL HIGH (ref 8–23)
CO2: 31 mmol/L (ref 22–32)
Calcium: 8.3 mg/dL — ABNORMAL LOW (ref 8.9–10.3)
Chloride: 92 mmol/L — ABNORMAL LOW (ref 98–111)
Creatinine, Ser: 1.66 mg/dL — ABNORMAL HIGH (ref 0.61–1.24)
GFR calc non Af Amer: 44 mL/min — ABNORMAL LOW (ref >60)
GFR, EST AFRICAN AMERICAN: 50 mL/min — AB (ref >60)
Glucose, Bld: 321 mg/dL — ABNORMAL HIGH (ref 70–99)
Potassium: 4.2 mmol/L (ref 3.5–5.1)
Sodium: 132 mmol/L — ABNORMAL LOW (ref 135–145)
Total Bilirubin: 0.6 mg/dL (ref 0.3–1.2)
Total Protein: 6.2 g/dL — ABNORMAL LOW (ref 6.5–8.1)

## 2018-11-26 LAB — MAGNESIUM: Magnesium: 2.5 mg/dL — ABNORMAL HIGH (ref 1.7–2.4)

## 2018-11-26 LAB — PHOSPHORUS: Phosphorus: 4.9 mg/dL — ABNORMAL HIGH (ref 2.5–4.6)

## 2018-11-26 LAB — BRAIN NATRIURETIC PEPTIDE: B Natriuretic Peptide: 1629 pg/mL — ABNORMAL HIGH (ref 0.0–100.0)

## 2018-11-26 MED ORDER — METOLAZONE 5 MG PO TABS
2.5000 mg | ORAL_TABLET | ORAL | Status: DC
  Administered 2018-11-27: 2.5 mg via ORAL
  Filled 2018-11-26: qty 1

## 2018-11-26 MED ORDER — NITROGLYCERIN 2 % TD OINT: 1.0000 [in_us] | TOPICAL_OINTMENT | Freq: Four times a day (QID) | TRANSDERMAL | Status: AC

## 2018-11-26 MED ORDER — ORAL CARE MOUTH RINSE
15.0000 mL | Freq: Two times a day (BID) | OROMUCOSAL | Status: DC
  Administered 2018-11-26 – 2018-12-07 (×17): 15 mL via OROMUCOSAL

## 2018-11-26 MED ORDER — PANTOPRAZOLE SODIUM 40 MG PO TBEC
40.0000 mg | DELAYED_RELEASE_TABLET | Freq: Every day | ORAL | Status: DC
  Administered 2018-11-27 – 2018-12-08 (×11): 40 mg via ORAL
  Filled 2018-11-26 (×12): qty 1

## 2018-11-26 MED ORDER — FUROSEMIDE 10 MG/ML IJ SOLN
40.0000 mg | Freq: Once | INTRAMUSCULAR | Status: AC
  Administered 2018-11-26: 40 mg via INTRAVENOUS
  Filled 2018-11-26: qty 4

## 2018-11-26 MED ORDER — SIMVASTATIN 20 MG PO TABS
40.0000 mg | ORAL_TABLET | Freq: Every day | ORAL | Status: DC
  Administered 2018-11-26 – 2018-12-06 (×11): 40 mg via ORAL
  Filled 2018-11-26 (×12): qty 2

## 2018-11-26 MED ORDER — SERTRALINE HCL 50 MG PO TABS
50.0000 mg | ORAL_TABLET | Freq: Every day | ORAL | Status: DC
  Administered 2018-11-26 – 2018-12-08 (×13): 50 mg via ORAL
  Filled 2018-11-26 (×13): qty 1

## 2018-11-26 MED ORDER — WARFARIN - PHARMACIST DOSING INPATIENT
Freq: Every day | Status: DC
  Administered 2018-11-27 – 2018-11-28 (×2)

## 2018-11-26 MED ORDER — MAGNESIUM OXIDE 400 (241.3 MG) MG PO TABS
400.0000 mg | ORAL_TABLET | Freq: Two times a day (BID) | ORAL | Status: DC
  Administered 2018-11-26 – 2018-12-07 (×22): 400 mg via ORAL
  Filled 2018-11-26 (×22): qty 1

## 2018-11-26 MED ORDER — FERROUS SULFATE 325 (65 FE) MG PO TABS
325.0000 mg | ORAL_TABLET | Freq: Two times a day (BID) | ORAL | Status: DC
  Administered 2018-11-26 – 2018-12-08 (×24): 325 mg via ORAL
  Filled 2018-11-26 (×24): qty 1

## 2018-11-26 MED ORDER — WARFARIN SODIUM 5 MG PO TABS
12.0000 mg | ORAL_TABLET | Freq: Every day | ORAL | Status: DC
  Administered 2018-11-26: 12 mg via ORAL
  Filled 2018-11-26 (×2): qty 2

## 2018-11-26 MED ORDER — ALBUTEROL SULFATE (2.5 MG/3ML) 0.083% IN NEBU
2.5000 mg | INHALATION_SOLUTION | Freq: Four times a day (QID) | RESPIRATORY_TRACT | Status: DC | PRN
  Administered 2018-11-26 – 2018-12-01 (×5): 2.5 mg via RESPIRATORY_TRACT
  Filled 2018-11-26 (×5): qty 3

## 2018-11-26 MED ORDER — ACETAMINOPHEN 650 MG RE SUPP: 650.0000 mg | Freq: Four times a day (QID) | RECTAL | Status: DC | PRN

## 2018-11-26 MED ORDER — PRIMIDONE 50 MG PO TABS
50.0000 mg | ORAL_TABLET | Freq: Every day | ORAL | Status: DC
  Administered 2018-11-26 – 2018-12-08 (×13): 50 mg via ORAL
  Filled 2018-11-26 (×13): qty 1

## 2018-11-26 MED ORDER — NITROGLYCERIN 0.4 MG SL SUBL: 0.4000 mg | SUBLINGUAL_TABLET | SUBLINGUAL | Status: DC | PRN

## 2018-11-26 MED ORDER — ACETAMINOPHEN 325 MG PO TABS
650.0000 mg | ORAL_TABLET | Freq: Four times a day (QID) | ORAL | Status: DC | PRN
  Administered 2018-11-28: 650 mg via ORAL
  Filled 2018-11-26: qty 2

## 2018-11-26 MED ORDER — INSULIN ASPART 100 UNIT/ML ~~LOC~~ SOLN
0.0000 [IU] | Freq: Three times a day (TID) | SUBCUTANEOUS | Status: DC
  Administered 2018-11-27 (×2): 3 [IU] via SUBCUTANEOUS
  Administered 2018-11-27 – 2018-11-28 (×2): 5 [IU] via SUBCUTANEOUS
  Administered 2018-11-28: 3 [IU] via SUBCUTANEOUS

## 2018-11-26 NOTE — ED Notes (Signed)
ED TO INPATIENT HANDOFF REPORT  Name/Age/Gender [name] 63 y.o. male  Code Status Code Status History    Date Active Date Inactive Code Status Order ID Comments User Context   10/16/2018 1912 10/18/2018 1532 Full Code [id number]  Lenore Cordia, MD ED   09/05/2018 0130 09/07/2018 2127 Full Code [id number]  Bethena Roys, MD Inpatient   03/25/2017 2234 03/31/2017 1228 Full Code [id number]  Orvan Falconer, MD Inpatient   01/26/2014 1035 01/27/2014 2052 Full Code [id number]  Kathie Dike, MD Inpatient      Home/SNF/Other Home  Chief Complaint Difficulty Breathing  Level of Care/Admitting Diagnosis ED Disposition    None      Medical History Past Medical History:  Diagnosis Date  . A-fib ([place])   . Anemia, normocytic normochromic 2008   2008  . Arteriosclerotic cardiovascular disease (ASCVD)    Acute MI in 2006->  LAD stent, EF-37%  . Arthritis    Hx: of in hands  . Automatic implantable cardioverter-defibrillator in situ   . Carpal tunnel syndrome   . Chronic systolic (congestive) heart failure (HCC)    a. EF 20-25% by echo in 03/2017  . Colon cancer ([place]) 2006   Partial colectomy in 2006  . Complete heart block ([place])    S/P ICD 02/09/13  . COPD (chronic obstructive pulmonary disease) ([place])   . Coronary artery disease   . Diabetes mellitus, type II ([place])    With peripheral neuropathy  . Edema    venous insufficiency  . H/O alcohol dependence ([place])   . H/O hiatal hernia   . Hyperlipidemia   . Hypertension   . Myocardial infarction [facility])    Hx: of 2006  . Neuropathy in diabetes ([place])    Hx: of  . Pacemaker   . Pneumonia 01/2012   01/2012; subsequent admission for chest pain  . Shortness of breath    Hx: of   . Tobacco abuse     Allergies Allergies  Allergen Reactions  . Vancomycin Itching and Rash    IV Location/Drains/Wounds Patient Lines/Drains/Airways Status   Active Line/Drains/Airways    Name:   Placement date:   Placement time:   Site:    Days:   Peripheral IV 11/26/18 Left;Proximal Forearm   11/26/18    1623    Forearm   less than 1          Labs/Imaging Results for orders placed or performed during the hospital encounter of 11/26/18 (from the past 48 hour(s))  Brain natriuretic peptide     Status: Abnormal   Collection Time: 11/26/18  4:45 PM  Result Value Ref Range   B Natriuretic Peptide 1,629.0 (H) 0.0 - 100.0 pg/mL    Comment: Performed at [facility], [address], [place], [place] [address]  CBC with Differential     Status: Abnormal   Collection Time: 11/26/18  4:45 PM  Result Value Ref Range   WBC 5.3 4.0 - 10.5 K/uL   RBC 3.22 (L) 4.22 - 5.81 MIL/uL   Hemoglobin 9.5 (L) 13.0 - 17.0 g/dL   HCT 31.6 (L) 39.0 - 52.0 %   MCV 98.1 80.0 - 100.0 fL   MCH 29.5 26.0 - 34.0 pg   MCHC 30.1 30.0 - 36.0 g/dL   RDW 14.6 11.5 - 15.5 %   Platelets 182 150 - 400 K/uL   nRBC 0.0 0.0 - 0.2 %   Neutrophils Relative % 72 %   Neutro Abs 3.8 1.7 - 7.7  K/uL   Lymphocytes Relative 9 %   Lymphs Abs 0.5 (L) 0.7 - 4.0 K/uL   Monocytes Relative 7 %   Monocytes Absolute 0.4 0.1 - 1.0 K/uL   Eosinophils Relative 12 %   Eosinophils Absolute 0.7 (H) 0.0 - 0.5 K/uL   Basophils Relative 0 %   Basophils Absolute 0.0 0.0 - 0.1 K/uL   Immature Granulocytes 0 %   Abs Immature Granulocytes 0.01 0.00 - 0.07 K/uL    Comment: Performed at [facility], [address] [place], [place] [address]  Comprehensive metabolic panel     Status: Abnormal   Collection Time: 11/26/18  4:45 PM  Result Value Ref Range   Sodium 132 (L) 135 - 145 mmol/L   Potassium 4.2 3.5 - 5.1 mmol/L   Chloride 92 (L) 98 - 111 mmol/L   CO2 31 22 - 32 mmol/L   Glucose, Bld 321 (H) 70 - 99 mg/dL   BUN 31 (H) 8 - 23 mg/dL   Creatinine, Ser 1.66 (H) 0.61 - 1.24 mg/dL   Calcium 8.3 (L) 8.9 - 10.3 mg/dL   Total Protein 6.2 (L) 6.5 - 8.1 g/dL   Albumin 3.0 (L) 3.5 - 5.0 g/dL   AST 26 15 - 41 U/L   ALT 24 0 - 44 U/L   Alkaline Phosphatase 202 (H) 38 - 126 U/L    Total Bilirubin 0.6 0.3 - 1.2 mg/dL   GFR calc non Af Amer 44 (L) >60 mL/min   GFR calc Af Amer 50 (L) >60 mL/min   Anion gap 9 5 - 15    Comment: Performed at [facility], [address], [place], [place] [address]  Troponin I - Once     Status: Abnormal   Collection Time: 11/26/18  4:45 PM  Result Value Ref Range   Troponin I 0.06 (HH) <0.03 ng/mL    Comment: CRITICAL RESULT CALLED TO, READ BACK BY AND VERIFIED WITH: CREWS.M. AT 7510 ON 11/26/2018 Performed at [facility], [address] [place], [place] [address]    Dg Chest 2 View  Result Date: 11/26/2018 CLINICAL DATA:  Shortness of breath.  Lower extremity edema. EXAM: CHEST - 2 VIEW COMPARISON:  PA and lateral chest 10/16/2018 and 05/25/2017. FINDINGS: There is cardiomegaly and interstitial edema. Small right pleural effusion has increased since the most recent examination. Pacing device is in place. No pneumothorax. No acute or focal bony abnormality. IMPRESSION: Cardiomegaly and increased interstitial pulmonary edema. Small right pleural effusion has also increased since the most recent study. Electronically Signed   By: Inge Rise M.D.   On: 11/26/2018 17:12    Pending Labs Unresulted Labs (From admission, onward)    Start     Ordered   11/26/18 1852  Protime-INR  ONCE - STAT,   STAT     11/26/18 1852          Vitals/Pain Today's Vitals   11/26/18 1700 11/26/18 1730 11/26/18 1800 11/26/18 1830  BP: (!) 148/87 (!) 161/83 (!) 152/81 (!) 147/76  Pulse: 99 93  87  Resp: 16 (!) 37 18 19  Temp:      SpO2: 90% 98%  97%  Weight:      Height:      PainSc:        Isolation Precautions No active isolations  Medications Medications  furosemide (LASIX) injection 40 mg (40 mg Intravenous Given 11/26/18 1721)    Mobility walks

## 2018-11-26 NOTE — ED Triage Notes (Signed)
Pt brought in by EMS due to SOB. Pt has pacemaker. Pt has had gradually worsening of SOB. Noted to have pitting edema from torso to feet. Legs are red with dry patchy area. Pt has dx CHF. Has pacemaker and is on coumadin

## 2018-11-26 NOTE — Progress Notes (Signed)
ANTICOAGULATION CONSULT NOTE - Follow Up Consult  Pharmacy Consult for warfarin Indication: atrial fibrillation  Allergies  Allergen Reactions  . Vancomycin Itching and Rash    Patient Measurements: Height: 6' (182.9 cm) Weight: 180 lb (81.6 kg) IBW/kg (Calculated) : 77.6  Vital Signs: Temp: 98.1 F (36.7 C) (02/23 1620) BP: 147/76 (02/23 1830) Pulse Rate: 87 (02/23 1830)  Labs: Recent Labs    11/26/18 1645  HGB 9.5*  HCT 31.6*  PLT 182  LABPROT 26.6*  INR 2.49  CREATININE 1.66*  TROPONINI 0.06*    Estimated Creatinine Clearance: 50.6 mL/min (A) (by C-G formula based on SCr of 1.66 mg/dL (H)).   Medications:  (Not in a hospital admission)  Scheduled:  . [START ON 11/27/2018] insulin aspart  0-15 Units Subcutaneous TID WC  . warfarin  12 mg Oral q1800  . [START ON 11/27/2018] Warfarin - Pharmacist Dosing Inpatient   Does not apply q1800   Infusions:   PRN: acetaminophen **OR** acetaminophen Anti-infectives (From admission, onward)   None      Assessment: 63 year old male on warfarin PTA for atrial fibrillation, patient takes warfarin 12mg  po nightly, admission INR was 2.49, therapeutic.    Goal of Therapy:  INR 2-3 Monitor platelets by anticoagulation protocol: Yes   Plan:  Continue warfarin home dose of warfarin 12mg  po daily, monitor INR and CBC closely. Monitor for signs and symptoms of bleeding.   Verlinda Slotnick 11/26/2018,7:51 PM

## 2018-11-26 NOTE — H&P (Signed)
History and Physical    [name]:[id number] DOB: 08-13-1956 DOA: 11/26/2018  PCP: Sinda Du, MD   Patient coming from: Home.  I have personally briefly reviewed patient's old medical records in [place]  Chief Complaint: Shortness of breath.  HPI: [name] is a 63 y.o. male with medical history significant of chronic atrial fibrillation, normocytic anemia, CAD, osteoarthritis of hands, chronic combined systolic and diastolic heart failure, history of complete heart block, history of AICD placement, carpal tunnel syndrome, history of colon cancer with partial colectomy in 2006, COPD, CAD, type 2 diabetes, diabetic peripheral neuropathy, history of alcohol abuse, GERD, hiatal hernia, hyperlipidemia, hypertension, history of pneumonia, history of tobacco abuse with abstinence for the past 2 weeks who is coming to the emergency department with progressively worse shortness of breath for the past few days.  He states that he has been following closely with cardiology (Dr. Crissie Sickles) for the past 3 weeks.  Patient states that Dr. Lovena Le has been adjusting up the patient's diuretics without significant results on his swelling and dyspnea.  He also has been having difficulty sleeping and orthopnea.  He denies increased sodium intake or any dietary indiscretions.  He denies fever, chills, sore throat, rhinorrhea or hemoptysis.  He occasionally coughs up brown sputum, but denies any recent cough.  He gets occasional wheezing.  He denies abdominal pain, nausea, emesis, constipation, diarrhea, melena or hematochezia.  No dysuria, frequency or hematuria.    ED Course: Initial vital signs temperature 98.1 F, pulse 95, respirations 40, blood sugar 158/85 mmHg and O2 sat 96% on room air.  The patient was given supplemental oxygen and furosemide 40 mg IVP x1 dose.  His white count was 5.3, hemoglobin 9.5 g/dL platelets 182.  Sodium 132, potassium 4.2, chloride 92 and CO2 31 mmol/L.   BUN is 31, creatinine 1.66, glucose 321, calcium 8.3, phosphorus 4.9 and magnesium 2.5 mg/dL.  Total protein was 6.2 and albumin was 3.0 g/dL.  Bilirubin, AST and ALT are within normal limits.  Alkaline phosphatase is 202 units/L.  Troponin was 0.06 ng/mL and BNP 1629.0 pg/mL.  EKG shows ventricular paced complexes.  His chest radiograph shows cardiomegaly and increased interstitial pulmonary edema.  There is small right pleural effusion which has also increased since previous radiograph.  Review of Systems: As per HPI otherwise 10 point review of systems negative.   Past Medical History:  Diagnosis Date  . A-fib ([place])   . Anemia, normocytic normochromic 2008   2008  . Arteriosclerotic cardiovascular disease (ASCVD)    Acute MI in 2006->  LAD stent, EF-37%  . Arthritis    Hx: of in hands  . Automatic implantable cardioverter-defibrillator in situ   . Carpal tunnel syndrome   . Chronic systolic (congestive) heart failure (HCC)    a. EF 20-25% by echo in 03/2017  . Colon cancer ([place]) 2006   Partial colectomy in 2006  . Complete heart block ([place])    S/P ICD 02/09/13  . COPD (chronic obstructive pulmonary disease) ([place])   . Coronary artery disease   . Diabetes mellitus, type II ([place])    With peripheral neuropathy  . Edema    venous insufficiency  . H/O alcohol dependence ([place])   . H/O hiatal hernia   . Hyperlipidemia   . Hypertension   . Myocardial infarction [facility])    Hx: of 2006  . Neuropathy in diabetes ([place])    Hx: of  . Pacemaker   . Pneumonia  01/2012   01/2012; subsequent admission for chest pain  . Shortness of breath    Hx: of   . Tobacco abuse     Past Surgical History:  Procedure Laterality Date  . ANKLE SURGERY     Right  . Biventricular ICD Placement  02/09/2013   Dr. Lovena Le  . COLONOSCOPY  08/17/2005   Pancolonic diverticula/Multiple rectal polyps removed as described above. The larger of the two likely responsible for intermittent hematochezia/ Multiple polyps at  the hepatic flexure resected with a snare./ Large polypoid lesion growing out of the base of the cecum, just behind the  ileocecal valve. This was not felt to be amendable to endoscopic resection. Biopsied multiple times  . CORONARY ANGIOPLASTY WITH STENT PLACEMENT    . ESOPHAGOGASTRODUODENOSCOPY N/A 03/29/2017   Barrett's esophagus, gastritis, duodenitis, focal glandular atypia on biopsy. Needs surveillance June 2019   . FLEXIBLE SIGMOIDOSCOPY    06/13/2006   Essentially normal residual rectum status post total colectomy. anastomosis at 25cm.  Focal area of abnormality adjacent to suture, as described above, likely not significant, suspect granulation tissue, biopsied.  Anastomosis otherwise appeared normal.  Distal terminal ileum appeared normal  . FLEXIBLE SIGMOIDOSCOPY N/A 01/13/2015   Dr. Gala Romney: normal-appearing residual rectum, surgical anastomosis, s/p subtotal colectomy. Surveillance 5 years   . FLEXIBLE SIGMOIDOSCOPY N/A 03/29/2017   Dr. Oneida Alar while inpatient: intermittent rectal bleeding due to ischemic erosion at anastomosis,s/p APC therapy. one 3 mm polyp in recum (hyperplastic). surveillance in 3 years  . INGUINAL HERNIA REPAIR    . MULTIPLE EXTRACTIONS WITH ALVEOLOPLASTY N/A 04/09/2013   Procedure: Extraction of tooth #'s 1,2,4,5,6,7,8,9,10,11,12,13,17,18,20,21,22,23,27,28, 29,30,31, and 32 with alveoloplasty.;  Surgeon: Lenn Cal, DDS;  Location: [place];  Service: Oral Surgery;  Laterality: N/A;  . PERMANENT PACEMAKER INSERTION N/A 02/09/2013   Procedure: PERMANENT PACEMAKER INSERTION;  Surgeon: Evans Lance, MD;  Location[facility] LAB;  Service: Cardiovascular;  Laterality: N/A;  . SUBTOTAL COLECTOMY  09/01/2005   subtotal colectomy  . TEMPORARY PACEMAKER INSERTION Right 02/09/2013   Procedure: TEMPORARY PACEMAKER INSERTION;  Surgeon: Sinclair Grooms, MD;  Location: [facility] CATH LAB;  Service: Cardiovascular;  Laterality: Right;     reports that he has been smoking cigarettes. He  started smoking about 47 years ago. He has a 4.00 pack-year smoking history. He has never used smokeless tobacco. He reports that he does not drink alcohol or use drugs.  Allergies  Allergen Reactions  . Vancomycin Itching and Rash    Family History  Problem Relation Age of Onset  . Colon cancer Mother        Marquis Down, ?>age 58.  [name] Heart disease Mother   . Heart disease Father   . Hypertension Brother   . Heart disease Brother   . Diabetes Brother   . Liver disease Neg Hx    Prior to Admission medications   Medication Sig Start Date End Date Taking? Authorizing Provider  acetaminophen (TYLENOL) 500 MG tablet Take 500 mg by mouth every 6 (six) hours as needed for mild pain or moderate pain.  01/08/14  Yes [provider]  albuterol (PROVENTIL) (2.5 MG/3ML) 0.083% nebulizer solution Take 2.5 mg by nebulization every 6 (six) hours as needed. For shortness of breath   Yes [provider]  ferrous sulfate 325 (65 FE) MG EC tablet Take 325 mg by mouth 2 (two) times daily.   Yes [provider]  insulin glargine (LANTUS) 100 UNIT/ML injection Inject 20 Units into  the skin daily as needed (for high blood sugar).    Yes [provider]  Lactobacillus (ACIDOPHOLUS PO) Take 1 tablet by mouth daily.   Yes [provider]  magnesium oxide (MAG-OX) 400 MG tablet Take 400 mg by mouth 2 (two) times daily.   Yes [provider]  metolazone (ZAROXOLYN) 2.5 MG tablet Take 2.5 mg (1 tablet) on Monday, Wednesday & Friday 30 minutes prior to taking torsemide. 11/15/18  Yes Evans Lance, MD  Multiple Vitamin (MULTIVITAMIN) tablet Take 1 tablet by mouth daily.   Yes [provider]  pantoprazole (PROTONIX) 40 MG tablet Take 1 tablet (40 mg total) by mouth daily before breakfast. 03/31/17  Yes Sinda Du, MD  primidone (MYSOLINE) 50 MG tablet Take 50 mg by mouth daily.    Yes [provider]  sertraline (ZOLOFT) 50 MG tablet Take 50  mg by mouth daily.   Yes [provider]  simvastatin (ZOCOR) 40 MG tablet Take 40 mg by mouth at bedtime.    Yes [provider]  torsemide (DEMADEX) 20 MG tablet Take 2 tablets (40 mg total) by mouth 2 (two) times daily. 11/21/18 02/14/2019 Yes Evans Lance, MD  warfarin (COUMADIN) 4 MG tablet Take 3 tablets daily or as directed Patient taking differently: Take 12 mg by mouth every evening.  09/18/18  Yes Branch, Alphonse Guild, MD  albuterol (PROVENTIL HFA;VENTOLIN HFA) 108 (90 BASE) MCG/ACT inhaler Inhale 2 puffs into the lungs every 6 (six) hours as needed. For shortness of breath    [provider]  nitroGLYCERIN (NITROSTAT) 0.4 MG SL tablet Place 1 tablet (0.4 mg total) under the tongue every 5 (five) minutes x 3 doses as needed. For chest pain 03/08/18   Evans Lance, MD  ondansetron (ZOFRAN) 4 MG tablet Take 4 mg by mouth 4 (four) times daily as needed for nausea or vomiting.    [provider]    Physical Exam: Vitals:   11/26/18 1945 11/26/18 2000 11/26/18 2015 11/26/18 2030  BP:  (!) 152/77  (!) 144/75  Pulse:      Resp: 17 16 (!) 21 (!) 24  Temp:      SpO2:      Weight:      Height:        Constitutional: Looks chronically ill. Eyes: PERRL, lids and conjunctivae normal ENMT: Mucous membranes are moist. Posterior pharynx clear of any exudate or lesions.Normal dentition.  Neck: normal, supple, no masses, no thyromegaly, mild JVD. Respiratory: Decreased breath sounds on bases with bibasilar crackles.  Tachypneic at 26 breaths/min.  No accessory muscle use.  Cardiovascular: Regular rate and rhythm, no murmurs / rubs / gallops.  4+ lower extremities pitting edema. 2+ pedal pulses.  Stage III lymphedema. No carotid bruits.  Abdomen: Anasarca, soft, no tenderness, no masses palpated. No hepatosplenomegaly. Bowel sounds positive.  Musculoskeletal: no clubbing / cyanosis. Good ROM, no contractures. Normal muscle tone.  Skin: Hyperkeratotic and scaly  changes on both lower extremities pretibial areas. Neurologic: CN 2-12 grossly intact. Sensation intact, DTR normal. Strength 5/5 in all 4.  Psychiatric: Normal judgment and insight. Alert and oriented x 4.  Mildly anxious mood.   Labs on Admission: I have personally reviewed following labs and imaging studies  CBC: Recent Labs  Lab 11/26/18 1645  WBC 5.3  NEUTROABS 3.8  HGB 9.5*  HCT 31.6*  MCV 98.1  PLT 376   Basic Metabolic Panel: Recent Labs  Lab 11/21/18 0935 11/26/18 1645  NA 133* 132*  K 4.6 4.2  CL 94* 92*  CO2 31 31  GLUCOSE 289* 321*  BUN 37* 31*  CREATININE 1.87* 1.66*  CALCIUM 8.5* 8.3*   GFR: Estimated Creatinine Clearance: 50.6 mL/min (A) (by C-G formula based on SCr of 1.66 mg/dL (H)). Liver Function Tests: Recent Labs  Lab 11/26/18 1645  AST 26  ALT 24  ALKPHOS 202*  BILITOT 0.6  PROT 6.2*  ALBUMIN 3.0*   No results for input(s): LIPASE, AMYLASE in the last 168 hours. No results for input(s): AMMONIA in the last 168 hours. Coagulation Profile: Recent Labs  Lab 11/26/18 1645  INR 2.49   Cardiac Enzymes: Recent Labs  Lab 11/26/18 1645  TROPONINI 0.06*   BNP (last 3 results) No results for input(s): PROBNP in the last 8760 hours. HbA1C: No results for input(s): HGBA1C in the last 72 hours. CBG: No results for input(s): GLUCAP in the last 168 hours. Lipid Profile: No results for input(s): CHOL, HDL, LDLCALC, TRIG, CHOLHDL, LDLDIRECT in the last 72 hours. Thyroid Function Tests: No results for input(s): TSH, T4TOTAL, FREET4, T3FREE, THYROIDAB in the last 72 hours. Anemia Panel: No results for input(s): VITAMINB12, FOLATE, FERRITIN, TIBC, IRON, RETICCTPCT in the last 72 hours. Urine analysis:    Component Value Date/Time   COLORURINE YELLOW 09/04/2018 2115   APPEARANCEUR HAZY (A) 09/04/2018 2115   LABSPEC 1.015 09/04/2018 2115   PHURINE 5.0 09/04/2018 2115   GLUCOSEU NEGATIVE 09/04/2018 2115   HGBUR NEGATIVE 09/04/2018 2115    BILIRUBINUR NEGATIVE 09/04/2018 2115   [place] NEGATIVE 09/04/2018 2115   PROTEINUR 30 (A) 09/04/2018 2115   UROBILINOGEN 0.2 01/26/2014 0909   NITRITE NEGATIVE 09/04/2018 2115   LEUKOCYTESUR TRACE (A) 09/04/2018 2115    Radiological Exams on Admission: Dg Chest 2 View  Result Date: 11/26/2018 CLINICAL DATA:  Shortness of breath.  Lower extremity edema. EXAM: CHEST - 2 VIEW COMPARISON:  PA and lateral chest 10/16/2018 and 05/25/2017. FINDINGS: There is cardiomegaly and interstitial edema. Small right pleural effusion has increased since the most recent examination. Pacing device is in place. No pneumothorax. No acute or focal bony abnormality. IMPRESSION: Cardiomegaly and increased interstitial pulmonary edema. Small right pleural effusion has also increased since the most recent study. Electronically Signed   By: Inge Rise M.D.   On: 11/26/2018 17:12   09/07/2018 Echo ------------------------------------------------------------------- LV EF: 20% -   25%  ------------------------------------------------------------------- Indications:      CHF - 428.0.  ------------------------------------------------------------------- Study Conclusions  - Left ventricle: The cavity size was severely dilated. Systolic   function was severely reduced. The estimated ejection fraction   was in the range of 20% to 25%. Diffuse hypokinesis. Features are   consistent with a pseudonormal left ventricular filling pattern,   with concomitant abnormal relaxation and increased filling   pressure (grade 2 diastolic dysfunction). Doppler parameters are   consistent with elevated ventricular end-diastolic filling   pressure. - Ventricular septum: Septal motion showed abnormal function,   dyssynergy, and paradox. - Aortic valve: There was mild stenosis. There was trivial   regurgitation. Valve area (VTI): 2.04 cm^2. Valve area (Vmax):   2.09 cm^2. Valve area (Vmean): 1.98 cm^2. - Mitral valve: There  was moderate regurgitation. - Left atrium: The atrium was severely dilated. - Right ventricle: The cavity size was moderately dilated. Wall   thickness was normal. Systolic function was mildly reduced. - Right atrium: The atrium was severely dilated. - Tricuspid valve: There was moderate regurgitation. - Pulmonic valve: There  was moderate regurgitation. - Pulmonary arteries: Systolic pressure was moderately increased.   PA peak pressure: 50 mm Hg (S). - Inferior vena cava: The vessel was dilated. The respirophasic   diameter changes were blunted (< 50%), consistent with elevated   central venous pressure. - Pericardium, extracardiac: There was no pericardial effusion.  EKG: Independently reviewed. Vent. rate 92 BPM PR interval * ms QRS duration 129 ms QT/QTc 397/492 ms P-R-T axes 71 -79 72 Ventricular-paced complexes No further analysis attempted due to paced rhythm Baseline wander in lead(s) III  Assessment/Plan Principal Problem:   Acute on chronic combined systolic and diastolic congestive heart failure (HCC) Observation/telemetry. Continue supplemental oxygen. Continue furosemide 40 mg IVP twice daily. Monitor daily weights, intake and output. Trend troponin level. Check EKG in a.m. Depending on progress, inpatient versus outpatient cardiology follow-up.  Active Problems:   COPD (chronic obstructive pulmonary disease) (HCC) Supplemental oxygen as needed. Bronchodilators as needed.    Arteriosclerotic cardiovascular disease (ASCVD) Continue warfarin and statin.    Diabetes mellitus, type II ([place]) The patient is using as needed Lantus?. Carbohydrate modified diet. CBG monitoring with regular insulin sliding scale.    Hypertension Continue diuretics. Monitor BP, BUN, creatinine and electrolytes.    Hyperlipidemia Continue simvastatin 40 mg p.o. daily. Monitor LFTs periodically.    Tobacco abuse Ceased smoking about 2 to 3 weeks ago. Declined nicotine  replacement therapy.    Atrial fibrillation (HCC) CHA?DS?-VASc Score of at least 4. Continue sotalol 80 mg p.o. twice daily. Continue warfarin per pharmacy.    IDA (iron deficiency anemia) Continue iron supplementation. Monitor H&H.    Barrett's esophagus Continue pantoprazole 40 mg p.o. daily.    Elevated troponin This is chronically elevated. Will trend level x3.    DVT prophylaxis: On warfarin. Code Status: Full code. Family Communication: Disposition Plan: Observation for acute combined CHF exacerbation treatment. Consults called: Admission status: Observation/telemetry.   Reubin Milan MD [facility]  11/26/2018, 8:53 PM

## 2018-11-26 NOTE — ED Provider Notes (Addendum)
[facility] EMERGENCY DEPARTMENT Provider Note   CSN: [id number] Arrival date & time: 11/26/18  1612    History   Chief Complaint Chief Complaint  Patient presents with  . Shortness of Breath    HPI [name] is a 63 y.o. male.     Level 5 cavity for acuity of condition.  Patient presents with worsening dyspnea over several days.  He has multiple cardiovascular risk factors well-documented in the past medical history including a cardioverter defibrillator.  No crushing substernal chest pain, dyspnea with exertion.  Additionally his legs are swollen, tight and erythematous.     Past Medical History:  Diagnosis Date  . A-fib ([place])   . Anemia, normocytic normochromic 2008   2008  . Arteriosclerotic cardiovascular disease (ASCVD)    Acute MI in 2006->  LAD stent, EF-37%  . Arthritis    Hx: of in hands  . Automatic implantable cardioverter-defibrillator in situ   . Carpal tunnel syndrome   . Chronic systolic (congestive) heart failure (HCC)    a. EF 20-25% by echo in 03/2017  . Colon cancer ([place]) 2006   Partial colectomy in 2006  . Complete heart block ([place])    S/P ICD 02/09/13  . COPD (chronic obstructive pulmonary disease) ([place])   . Coronary artery disease   . Diabetes mellitus, type II ([place])    With peripheral neuropathy  . Edema    venous insufficiency  . H/O alcohol dependence ([place])   . H/O hiatal hernia   . Hyperlipidemia   . Hypertension   . Myocardial infarction [facility])    Hx: of 2006  . Neuropathy in diabetes ([place])    Hx: of  . Pacemaker   . Pneumonia 01/2012   01/2012; subsequent admission for chest pain  . Shortness of breath    Hx: of   . Tobacco abuse     Patient Active Problem List   Diagnosis Date Noted  . Acute on chronic combined systolic (congestive) and diastolic (congestive) heart failure ([place]) 10/16/2018  . Hyperkalemia 10/16/2018  . H/O Clostridium difficile infection 10/02/2018  . ARF (acute renal failure) ([place]) 09/04/2018  .  Diarrhea 08/10/2018  . Abdominal bloating 07/22/2017  . IDA (iron deficiency anemia) 07/22/2017  . Barrett's esophagus 07/22/2017  . Heme positive stool   . Acute on chronic combined systolic and diastolic CHF (congestive heart failure) ([place]) 03/25/2017  . History of colon cancer 12/25/2014  . Abdominal pain 12/25/2014  . Encounter for therapeutic drug monitoring 02/11/2014  . Atrial fibrillation ([place]) 02/04/2014  . Dizziness 01/26/2014  . Chronic systolic CHF (congestive heart failure) ([place]) 01/26/2014  . Ataxia 01/26/2014  . Biventricular implantable cardioverter-defibrillator in situ 03/16/2013  . COPD (chronic obstructive pulmonary disease) ([place])   . Arteriosclerotic cardiovascular disease (ASCVD)   . Diabetes mellitus, type II ([place])   . Colon cancer ([place])   . Hypertension   . Hyperlipidemia   . Tobacco abuse   . Anemia, normocytic normochromic   . Chronic diarrhea 07/10/2012    Past Surgical History:  Procedure Laterality Date  . ANKLE SURGERY     Right  . Biventricular ICD Placement  02/09/2013   Dr. Lovena Le  . COLONOSCOPY  08/17/2005   Pancolonic diverticula/Multiple rectal polyps removed as described above. The larger of the two likely responsible for intermittent hematochezia/ Multiple polyps at the hepatic flexure resected with a snare./ Large polypoid lesion growing out of the base of the cecum, just behind the  ileocecal valve. This was  not felt to be amendable to endoscopic resection. Biopsied multiple times  . CORONARY ANGIOPLASTY WITH STENT PLACEMENT    . ESOPHAGOGASTRODUODENOSCOPY N/A 03/29/2017   Barrett's esophagus, gastritis, duodenitis, focal glandular atypia on biopsy. Needs surveillance June 2019   . FLEXIBLE SIGMOIDOSCOPY    06/13/2006   Essentially normal residual rectum status post total colectomy. anastomosis at 25cm.  Focal area of abnormality adjacent to suture, as described above, likely not significant, suspect granulation tissue, biopsied.  Anastomosis  otherwise appeared normal.  Distal terminal ileum appeared normal  . FLEXIBLE SIGMOIDOSCOPY N/A 01/13/2015   Dr. Gala Romney: normal-appearing residual rectum, surgical anastomosis, s/p subtotal colectomy. Surveillance 5 years   . FLEXIBLE SIGMOIDOSCOPY N/A 03/29/2017   Dr. Oneida Alar while inpatient: intermittent rectal bleeding due to ischemic erosion at anastomosis,s/p APC therapy. one 3 mm polyp in recum (hyperplastic). surveillance in 3 years  . INGUINAL HERNIA REPAIR    . MULTIPLE EXTRACTIONS WITH ALVEOLOPLASTY N/A 04/09/2013   Procedure: Extraction of tooth #'s 1,2,4,5,6,7,8,9,10,11,12,13,17,18,20,21,22,23,27,28, 29,30,31, and 32 with alveoloplasty.;  Surgeon: Lenn Cal, DDS;  Location: [facility];  Service: Oral Surgery;  Laterality: N/A;  . PERMANENT PACEMAKER INSERTION N/A 02/09/2013   Procedure: PERMANENT PACEMAKER INSERTION;  Surgeon: Evans Lance, MD;  Location[facility] C[facility] LAB;  Service: Cardiovascular;  Laterality: N/A;  . SUBTOTAL COLECTOMY  09/01/2005   subtotal colectomy  . TEMPORARY PACEMAKER INSERTION Right 02/09/2013   Procedure: TEMPORARY PACEMAKER INSERTION;  Surgeon: Sinclair Grooms, MD;  Location: [facility] CATH LAB;  Service: Cardiovascular;  Laterality: Right;        Home Medications    Prior to Admission medications   Medication Sig Start Date End Date Taking? Authorizing Provider  acetaminophen (TYLENOL) 500 MG tablet Take 500 mg by mouth every 6 (six) hours as needed for mild pain or moderate pain.  01/08/14  Yes [provider]  albuterol (PROVENTIL) (2.5 MG/3ML) 0.083% nebulizer solution Take 2.5 mg by nebulization every 6 (six) hours as needed. For shortness of breath   Yes [provider]  ferrous sulfate 325 (65 FE) MG EC tablet Take 325 mg by mouth 2 (two) times daily.   Yes [provider]  insulin glargine (LANTUS) 100 UNIT/ML injection Inject 20 Units into the skin daily as needed (for high blood sugar).    Yes [provider]    Lactobacillus (ACIDOPHOLUS PO) Take 1 tablet by mouth daily.   Yes [provider]  magnesium oxide (MAG-OX) 400 MG tablet Take 400 mg by mouth 2 (two) times daily.   Yes [provider]  metolazone (ZAROXOLYN) 2.5 MG tablet Take 2.5 mg (1 tablet) on Monday, Wednesday & Friday 30 minutes prior to taking torsemide. 11/15/18  Yes Evans Lance, MD  Multiple Vitamin (MULTIVITAMIN) tablet Take 1 tablet by mouth daily.   Yes [provider]  pantoprazole (PROTONIX) 40 MG tablet Take 1 tablet (40 mg total) by mouth daily before breakfast. 03/31/17  Yes Sinda Du, MD  primidone (MYSOLINE) 50 MG tablet Take 50 mg by mouth daily.    Yes [provider]  sertraline (ZOLOFT) 50 MG tablet Take 50 mg by mouth daily.   Yes [provider]  simvastatin (ZOCOR) 40 MG tablet Take 40 mg by mouth at bedtime.    Yes [provider]  torsemide (DEMADEX) 20 MG tablet Take 2 tablets (40 mg total) by mouth 2 (two) times daily. 11/21/18 02/09/2019 Yes Evans Lance, MD  warfarin (COUMADIN) 4 MG tablet Take 3  tablets daily or as directed Patient taking differently: Take 12 mg by mouth every evening.  09/18/18  Yes Branch, Alphonse Guild, MD  albuterol (PROVENTIL HFA;VENTOLIN HFA) 108 (90 BASE) MCG/ACT inhaler Inhale 2 puffs into the lungs every 6 (six) hours as needed. For shortness of breath    [provider]  nitroGLYCERIN (NITROSTAT) 0.4 MG SL tablet Place 1 tablet (0.4 mg total) under the tongue every 5 (five) minutes x 3 doses as needed. For chest pain 03/08/18   Evans Lance, MD  ondansetron (ZOFRAN) 4 MG tablet Take 4 mg by mouth 4 (four) times daily as needed for nausea or vomiting.    [provider]    Family History Family History  Problem Relation Age of Onset  . Colon cancer Mother        Darrold Bezek, ?>age 8.  [name] Heart disease Mother   . Heart disease Father   . Hypertension Brother   . Heart disease Brother   . Diabetes  Brother   . Liver disease Neg Hx     Social History Social History   Tobacco Use  . Smoking status: Current Every Day Smoker    Packs/day: 0.10    Years: 40.00    Pack years: 4.00    Types: Cigarettes    Start date: 07/13/1971    Last attempt to quit: 11/13/2018    Years since quitting: 0.0  . Smokeless tobacco: Never Used  Substance Use Topics  . Alcohol use: No    Alcohol/week: 0.0 standard drinks  . Drug use: No     Allergies   Vancomycin   Review of Systems Review of Systems  Unable to perform ROS: Acuity of condition     Physical Exam Updated Vital Signs BP (!) 147/76   Pulse 87   Temp 98.1 F (36.7 C)   Resp 19   Ht 6' (1.829 m)   Wt 81.6 kg   SpO2 97%   BMI 24.41 kg/m   Physical Exam Vitals signs and nursing note reviewed.  Constitutional:      Comments: Looks ill, dyspneic  HENT:     Head: Normocephalic and atraumatic.  Eyes:     Conjunctiva/sclera: Conjunctivae normal.  Neck:     Musculoskeletal: Neck supple.  Cardiovascular:     Rate and Rhythm: Normal rate and regular rhythm.  Pulmonary:     Comments: Slight tachypnea, rales on exam Abdominal:     General: Bowel sounds are normal.     Palpations: Abdomen is soft.  Musculoskeletal: Normal range of motion.  Skin:    Comments: 4+ lower extremity edema with erythema in the distal extremities.  Neurological:     Mental Status: He is oriented to person, place, and time.  Psychiatric:        Behavior: Behavior normal.      ED Treatments / Results  Labs (all labs ordered are listed, but only abnormal results are displayed) Labs Reviewed  BRAIN NATRIURETIC PEPTIDE - Abnormal; Notable for the following components:      Result Value   B Natriuretic Peptide 1,629.0 (*)    All other components within normal limits  CBC WITH DIFFERENTIAL/PLATELET - Abnormal; Notable for the following components:   RBC 3.22 (*)    Hemoglobin 9.5 (*)    HCT 31.6 (*)    Lymphs Abs 0.5 (*)    Eosinophils  Absolute 0.7 (*)    All other components within normal limits  COMPREHENSIVE METABOLIC PANEL - Abnormal;  Notable for the following components:   Sodium 132 (*)    Chloride 92 (*)    Glucose, Bld 321 (*)    BUN 31 (*)    Creatinine, Ser 1.66 (*)    Calcium 8.3 (*)    Total Protein 6.2 (*)    Albumin 3.0 (*)    Alkaline Phosphatase 202 (*)    GFR calc non Af Amer 44 (*)    GFR calc Af Amer 50 (*)    All other components within normal limits  TROPONIN I - Abnormal; Notable for the following components:   Troponin I 0.06 (*)    All other components within normal limits    EKG EKG Interpretation  Date/Time:  Sunday November 26 2018 16:21:15 EST Ventricular Rate:  92 PR Interval:    QRS Duration: 129 QT Interval:  397 QTC Calculation: 492 R Axis:   -79 Text Interpretation:  Ventricular-paced complexes No further analysis attempted due to paced rhythm Baseline wander in lead(s) III Confirmed by Nat Christen [phone]) on 11/26/2018 5:01:02 PM   Radiology Dg Chest 2 View  Result Date: 11/26/2018 CLINICAL DATA:  Shortness of breath.  Lower extremity edema. EXAM: CHEST - 2 VIEW COMPARISON:  PA and lateral chest 10/16/2018 and 05/25/2017. FINDINGS: There is cardiomegaly and interstitial edema. Small right pleural effusion has increased since the most recent examination. Pacing device is in place. No pneumothorax. No acute or focal bony abnormality. IMPRESSION: Cardiomegaly and increased interstitial pulmonary edema. Small right pleural effusion has also increased since the most recent study. Electronically Signed   By: Inge Rise M.D.   On: 11/26/2018 17:12    Procedures Procedures (including critical care time)  Medications Ordered in ED Medications  furosemide (LASIX) injection 40 mg (40 mg Intravenous Given 11/26/18 1721)     Initial Impression / Assessment and Plan / ED Course  I have reviewed the triage vital signs and the nursing notes.  Pertinent labs & imaging results  that were available during my care of the patient were reviewed by me and considered in my medical decision making (see chart for details).        History and physical most consistent with pulmonary edema/congestive heart failure.  Chest x-ray confirms same.  First troponin 0 0.06.  Will administer Lasix 40 mg IV.  Admit to general medicine.  CRITICAL CARE Performed by: Nat Christen Total critical care time: 30 minutes Critical care time was exclusive of separately billable procedures and treating other patients. Critical care was necessary to treat or prevent imminent or life-threatening deterioration. Critical care was time spent personally by me on the following activities: development of treatment plan with patient and/or surrogate as well as nursing, discussions with consultants, evaluation of patient's response to treatment, examination of patient, obtaining history from patient or surrogate, ordering and performing treatments and interventions, ordering and review of laboratory studies, ordering and review of radiographic studies, pulse oximetry and re-evaluation of patient's condition.  Final Clinical Impressions(s) / ED Diagnoses   Final diagnoses:  Acute pulmonary edema ([place])  Anemia, unspecified type    ED Discharge Orders    None       Nat Christen, MD 11/26/18 0814    Nat Christen, MD 12/11/18 [phone]

## 2018-11-26 NOTE — ED Notes (Signed)
ED TO INPATIENT HANDOFF REPORT  Name/Age/Gender [name] 63 y.o. male  Code Status    Code Status Orders  (From admission, onward)         Start     Ordered   11/26/18 1943  Full code  Continuous     11/26/18 1943        Code Status History    Date Active Date Inactive Code Status Order ID Comments User Context   10/16/2018 1912 10/18/2018 1532 Full Code [id number]  Lenore Cordia, MD ED   09/05/2018 0130 09/07/2018 2127 Full Code [id number]  Bethena Roys, MD Inpatient   03/25/2017 2234 03/31/2017 1228 Full Code [id number]  Orvan Falconer, MD Inpatient   01/26/2014 1035 01/27/2014 2052 Full Code [id number]  Kathie Dike, MD Inpatient      Home/SNF/Other Home  Chief Complaint Difficulty Breathing  Level of Care/Admitting Diagnosis ED Disposition    ED Disposition Condition [place]: [facility] [[id number]]  Level of Care: Telemetry [5]  Diagnosis: Acute on chronic combined systolic and diastolic congestive heart failure [facility]) [536644]  Admitting Physician: Reubin Milan [[id number]]  Attending Physician: Reubin Milan [[id number]]  PT Class (Do Not Modify): Observation [104]  PT Acc Code (Do Not Modify): Observation [10022]       Medical History Past Medical History:  Diagnosis Date  . A-fib ([place])   . Anemia, normocytic normochromic 2008   2008  . Arteriosclerotic cardiovascular disease (ASCVD)    Acute MI in 2006->  LAD stent, EF-37%  . Arthritis    Hx: of in hands  . Automatic implantable cardioverter-defibrillator in situ   . Carpal tunnel syndrome   . Chronic systolic (congestive) heart failure (HCC)    a. EF 20-25% by echo in 03/2017  . Colon cancer ([place]) 2006   Partial colectomy in 2006  . Complete heart block ([place])    S/P ICD 02/09/13  . COPD (chronic obstructive pulmonary disease) ([place])   . Coronary artery disease   . Diabetes mellitus, type II ([place])    With peripheral neuropathy  . Edema    venous  insufficiency  . H/O alcohol dependence ([place])   . H/O hiatal hernia   . Hyperlipidemia   . Hypertension   . Myocardial infarction [facility])    Hx: of 2006  . Neuropathy in diabetes ([place])    Hx: of  . Pacemaker   . Pneumonia 01/2012   01/2012; subsequent admission for chest pain  . Shortness of breath    Hx: of   . Tobacco abuse     Allergies Allergies  Allergen Reactions  . Vancomycin Itching and Rash    IV Location/Drains/Wounds Patient Lines/Drains/Airways Status   Active Line/Drains/Airways    Name:   Placement date:   Placement time:   Site:   Days:   Peripheral IV 11/26/18 Left;Proximal Forearm 20G   11/26/18    1623    Forearm   less than 1          Labs/Imaging Results for orders placed or performed during the hospital encounter of 11/26/18 (from the past 48 hour(s))  Brain natriuretic peptide     Status: Abnormal   Collection Time: 11/26/18  4:45 PM  Result Value Ref Range   B Natriuretic Peptide 1,629.0 (H) 0.0 - 100.0 pg/mL    Comment: Performed at [facility], [address] [place], [place] [address]  CBC with Differential     Status:  Abnormal   Collection Time: 11/26/18  4:45 PM  Result Value Ref Range   WBC 5.3 4.0 - 10.5 K/uL   RBC 3.22 (L) 4.22 - 5.81 MIL/uL   Hemoglobin 9.5 (L) 13.0 - 17.0 g/dL   HCT 31.6 (L) 39.0 - 52.0 %   MCV 98.1 80.0 - 100.0 fL   MCH 29.5 26.0 - 34.0 pg   MCHC 30.1 30.0 - 36.0 g/dL   RDW 14.6 11.5 - 15.5 %   Platelets 182 150 - 400 K/uL   nRBC 0.0 0.0 - 0.2 %   Neutrophils Relative % 72 %   Neutro Abs 3.8 1.7 - 7.7 K/uL   Lymphocytes Relative 9 %   Lymphs Abs 0.5 (L) 0.7 - 4.0 K/uL   Monocytes Relative 7 %   Monocytes Absolute 0.4 0.1 - 1.0 K/uL   Eosinophils Relative 12 %   Eosinophils Absolute 0.7 (H) 0.0 - 0.5 K/uL   Basophils Relative 0 %   Basophils Absolute 0.0 0.0 - 0.1 K/uL   Immature Granulocytes 0 %   Abs Immature Granulocytes 0.01 0.00 - 0.07 K/uL    Comment: Performed at [facility], [address],  [place], [place] [address]  Comprehensive metabolic panel     Status: Abnormal   Collection Time: 11/26/18  4:45 PM  Result Value Ref Range   Sodium 132 (L) 135 - 145 mmol/L   Potassium 4.2 3.5 - 5.1 mmol/L   Chloride 92 (L) 98 - 111 mmol/L   CO2 31 22 - 32 mmol/L   Glucose, Bld 321 (H) 70 - 99 mg/dL   BUN 31 (H) 8 - 23 mg/dL   Creatinine, Ser 1.66 (H) 0.61 - 1.24 mg/dL   Calcium 8.3 (L) 8.9 - 10.3 mg/dL   Total Protein 6.2 (L) 6.5 - 8.1 g/dL   Albumin 3.0 (L) 3.5 - 5.0 g/dL   AST 26 15 - 41 U/L   ALT 24 0 - 44 U/L   Alkaline Phosphatase 202 (H) 38 - 126 U/L   Total Bilirubin 0.6 0.3 - 1.2 mg/dL   GFR calc non Af Amer 44 (L) >60 mL/min   GFR calc Af Amer 50 (L) >60 mL/min   Anion gap 9 5 - 15    Comment: Performed at [facility], [address] [place], [place] [address]  Troponin I - Once     Status: Abnormal   Collection Time: 11/26/18  4:45 PM  Result Value Ref Range   Troponin I 0.06 (HH) <0.03 ng/mL    Comment: CRITICAL RESULT CALLED TO, READ BACK BY AND VERIFIED WITH: CREWS.M. AT 8469 ON 11/26/2018 Performed at [facility], [address], [place], [place] [address]   Protime-INR     Status: Abnormal   Collection Time: 11/26/18  4:45 PM  Result Value Ref Range   Prothrombin Time 26.6 (H) 11.4 - 15.2 seconds   INR 2.49     Comment: Performed at [facility], [address] [place], [place] [address]   Dg Chest 2 View  Result Date: 11/26/2018 CLINICAL DATA:  Shortness of breath.  Lower extremity edema. EXAM: CHEST - 2 VIEW COMPARISON:  PA and lateral chest 10/16/2018 and 05/25/2017. FINDINGS: There is cardiomegaly and interstitial edema. Small right pleural effusion has increased since the most recent examination. Pacing device is in place. No pneumothorax. No acute or focal bony abnormality. IMPRESSION: Cardiomegaly and increased interstitial pulmonary edema. Small right pleural effusion has also increased since the most recent study. Electronically Signed  By: Inge Rise M.D.   On: 11/26/2018 17:12    Pending Labs Unresulted Labs (From admission, onward)    Start     Ordered   11/27/18 7416  Basic metabolic panel  Daily,   R     11/26/18 1941   11/27/18 0500  CBC  Tomorrow morning,   R     11/26/18 1943   11/27/18 0500  Protime-INR  Daily,   R     11/26/18 1954   11/26/18 2245  Troponin I - Now Then Q6H  Now then every 6 hours,   R     11/26/18 1936   11/26/18 1937  Magnesium  Add-on,   R     11/26/18 1936   11/26/18 1937  Phosphorus  Add-on,   R     11/26/18 1936          Vitals/Pain Today's Vitals   11/26/18 1700 11/26/18 1730 11/26/18 1800 11/26/18 1830  BP: (!) 148/87 (!) 161/83 (!) 152/81 (!) 147/76  Pulse: 99 93  87  Resp: 16 (!) 37 18 19  Temp:      SpO2: 90% 98%  97%  Weight:      Height:      PainSc:        Isolation Precautions No active isolations  Medications Medications  insulin aspart (novoLOG) injection 0-15 Units (has no administration in time range)  acetaminophen (TYLENOL) tablet 650 mg (has no administration in time range)    Or  acetaminophen (TYLENOL) suppository 650 mg (has no administration in time range)  warfarin (COUMADIN) tablet 12 mg (has no administration in time range)  Warfarin - Pharmacist Dosing Inpatient (has no administration in time range)  furosemide (LASIX) injection 40 mg (40 mg Intravenous Given 11/26/18 1721)    Mobility non-ambulatory

## 2018-11-26 NOTE — ED Notes (Signed)
Date and time results received: 11/26/18 6:17 PM  (use smartphrase ".now" to insert current time)  Test: Troponin Critical Value: 0.06  Name of Provider Notified: Lacinda Axon  Orders Received? Or Actions Taken?: Orders Received - See Orders for details

## 2018-11-27 ENCOUNTER — Telehealth: Payer: Self-pay | Admitting: *Deleted

## 2018-11-27 ENCOUNTER — Encounter (HOSPITAL_COMMUNITY): Payer: Self-pay | Admitting: Cardiology

## 2018-11-27 ENCOUNTER — Telehealth: Payer: Self-pay | Admitting: Internal Medicine

## 2018-11-27 DIAGNOSIS — I272 Pulmonary hypertension, unspecified: Secondary | ICD-10-CM | POA: Diagnosis present

## 2018-11-27 DIAGNOSIS — J90 Pleural effusion, not elsewhere classified: Secondary | ICD-10-CM | POA: Diagnosis not present

## 2018-11-27 DIAGNOSIS — Z720 Tobacco use: Secondary | ICD-10-CM | POA: Diagnosis not present

## 2018-11-27 DIAGNOSIS — E43 Unspecified severe protein-calorie malnutrition: Secondary | ICD-10-CM | POA: Diagnosis present

## 2018-11-27 DIAGNOSIS — N183 Chronic kidney disease, stage 3 (moderate): Secondary | ICD-10-CM | POA: Diagnosis present

## 2018-11-27 DIAGNOSIS — I428 Other cardiomyopathies: Secondary | ICD-10-CM | POA: Diagnosis present

## 2018-11-27 DIAGNOSIS — R042 Hemoptysis: Secondary | ICD-10-CM | POA: Diagnosis not present

## 2018-11-27 DIAGNOSIS — I5043 Acute on chronic combined systolic (congestive) and diastolic (congestive) heart failure: Secondary | ICD-10-CM

## 2018-11-27 DIAGNOSIS — J9601 Acute respiratory failure with hypoxia: Secondary | ICD-10-CM | POA: Diagnosis not present

## 2018-11-27 DIAGNOSIS — I255 Ischemic cardiomyopathy: Secondary | ICD-10-CM | POA: Diagnosis present

## 2018-11-27 DIAGNOSIS — I2510 Atherosclerotic heart disease of native coronary artery without angina pectoris: Secondary | ICD-10-CM | POA: Diagnosis present

## 2018-11-27 DIAGNOSIS — I480 Paroxysmal atrial fibrillation: Secondary | ICD-10-CM | POA: Diagnosis present

## 2018-11-27 DIAGNOSIS — I25119 Atherosclerotic heart disease of native coronary artery with unspecified angina pectoris: Secondary | ICD-10-CM | POA: Diagnosis not present

## 2018-11-27 DIAGNOSIS — I429 Cardiomyopathy, unspecified: Secondary | ICD-10-CM | POA: Diagnosis not present

## 2018-11-27 DIAGNOSIS — I890 Lymphedema, not elsewhere classified: Secondary | ICD-10-CM | POA: Diagnosis present

## 2018-11-27 DIAGNOSIS — E1142 Type 2 diabetes mellitus with diabetic polyneuropathy: Secondary | ICD-10-CM | POA: Diagnosis present

## 2018-11-27 DIAGNOSIS — J441 Chronic obstructive pulmonary disease with (acute) exacerbation: Secondary | ICD-10-CM | POA: Diagnosis not present

## 2018-11-27 DIAGNOSIS — J449 Chronic obstructive pulmonary disease, unspecified: Secondary | ICD-10-CM | POA: Diagnosis not present

## 2018-11-27 DIAGNOSIS — Z9981 Dependence on supplemental oxygen: Secondary | ICD-10-CM | POA: Diagnosis not present

## 2018-11-27 DIAGNOSIS — R0602 Shortness of breath: Secondary | ICD-10-CM | POA: Diagnosis present

## 2018-11-27 DIAGNOSIS — J9621 Acute and chronic respiratory failure with hypoxia: Secondary | ICD-10-CM | POA: Diagnosis present

## 2018-11-27 DIAGNOSIS — I340 Nonrheumatic mitral (valve) insufficiency: Secondary | ICD-10-CM | POA: Diagnosis not present

## 2018-11-27 DIAGNOSIS — J810 Acute pulmonary edema: Secondary | ICD-10-CM | POA: Diagnosis not present

## 2018-11-27 DIAGNOSIS — Z7901 Long term (current) use of anticoagulants: Secondary | ICD-10-CM | POA: Diagnosis not present

## 2018-11-27 DIAGNOSIS — I5022 Chronic systolic (congestive) heart failure: Secondary | ICD-10-CM

## 2018-11-27 DIAGNOSIS — I442 Atrioventricular block, complete: Secondary | ICD-10-CM | POA: Diagnosis present

## 2018-11-27 DIAGNOSIS — Y92230 Patient room in hospital as the place of occurrence of the external cause: Secondary | ICD-10-CM | POA: Diagnosis not present

## 2018-11-27 DIAGNOSIS — I130 Hypertensive heart and chronic kidney disease with heart failure and stage 1 through stage 4 chronic kidney disease, or unspecified chronic kidney disease: Secondary | ICD-10-CM | POA: Diagnosis present

## 2018-11-27 DIAGNOSIS — E1122 Type 2 diabetes mellitus with diabetic chronic kidney disease: Secondary | ICD-10-CM | POA: Diagnosis present

## 2018-11-27 DIAGNOSIS — I872 Venous insufficiency (chronic) (peripheral): Secondary | ICD-10-CM | POA: Diagnosis present

## 2018-11-27 DIAGNOSIS — I482 Chronic atrial fibrillation, unspecified: Secondary | ICD-10-CM | POA: Diagnosis present

## 2018-11-27 DIAGNOSIS — Z515 Encounter for palliative care: Secondary | ICD-10-CM | POA: Diagnosis not present

## 2018-11-27 DIAGNOSIS — R188 Other ascites: Secondary | ICD-10-CM | POA: Diagnosis present

## 2018-11-27 DIAGNOSIS — Z66 Do not resuscitate: Secondary | ICD-10-CM | POA: Diagnosis not present

## 2018-11-27 DIAGNOSIS — N179 Acute kidney failure, unspecified: Secondary | ICD-10-CM | POA: Diagnosis present

## 2018-11-27 DIAGNOSIS — Z7189 Other specified counseling: Secondary | ICD-10-CM | POA: Diagnosis not present

## 2018-11-27 DIAGNOSIS — I252 Old myocardial infarction: Secondary | ICD-10-CM | POA: Diagnosis not present

## 2018-11-27 LAB — CBC
HCT: 30.9 % — ABNORMAL LOW (ref 39.0–52.0)
Hemoglobin: 9.2 g/dL — ABNORMAL LOW (ref 13.0–17.0)
MCH: 29.1 pg (ref 26.0–34.0)
MCHC: 29.8 g/dL — ABNORMAL LOW (ref 30.0–36.0)
MCV: 97.8 fL (ref 80.0–100.0)
PLATELETS: 188 10*3/uL (ref 150–400)
RBC: 3.16 MIL/uL — ABNORMAL LOW (ref 4.22–5.81)
RDW: 14.6 % (ref 11.5–15.5)
WBC: 6.5 10*3/uL (ref 4.0–10.5)
nRBC: 0 % (ref 0.0–0.2)

## 2018-11-27 LAB — PROTIME-INR
INR: 2.66
Prothrombin Time: 28 seconds — ABNORMAL HIGH (ref 11.4–15.2)

## 2018-11-27 LAB — BASIC METABOLIC PANEL
Anion gap: 11 (ref 5–15)
BUN: 27 mg/dL — ABNORMAL HIGH (ref 8–23)
CALCIUM: 8.5 mg/dL — AB (ref 8.9–10.3)
CO2: 30 mmol/L (ref 22–32)
Chloride: 94 mmol/L — ABNORMAL LOW (ref 98–111)
Creatinine, Ser: 1.49 mg/dL — ABNORMAL HIGH (ref 0.61–1.24)
GFR calc Af Amer: 57 mL/min — ABNORMAL LOW (ref >60)
GFR, EST NON AFRICAN AMERICAN: 50 mL/min — AB (ref >60)
Glucose, Bld: 178 mg/dL — ABNORMAL HIGH (ref 70–99)
Potassium: 4.1 mmol/L (ref 3.5–5.1)
Sodium: 135 mmol/L (ref 135–145)

## 2018-11-27 LAB — GLUCOSE, CAPILLARY
Glucose-Capillary: 161 mg/dL — ABNORMAL HIGH (ref 70–99)
Glucose-Capillary: 183 mg/dL — ABNORMAL HIGH (ref 70–99)
Glucose-Capillary: 212 mg/dL — ABNORMAL HIGH (ref 70–99)
Glucose-Capillary: 191 mg/dL — ABNORMAL HIGH (ref 70–99)

## 2018-11-27 LAB — TROPONIN I: Troponin I: 0.07 ng/mL (ref <0.03)

## 2018-11-27 MED ORDER — WARFARIN SODIUM 5 MG PO TABS
12.0000 mg | ORAL_TABLET | Freq: Once | ORAL | Status: AC
  Administered 2018-11-27: 12 mg via ORAL
  Filled 2018-11-27: qty 2

## 2018-11-27 MED ORDER — FUROSEMIDE 10 MG/ML IJ SOLN
40.0000 mg | Freq: Two times a day (BID) | INTRAMUSCULAR | Status: DC
  Administered 2018-11-27 (×2): 40 mg via INTRAVENOUS
  Filled 2018-11-27 (×2): qty 4

## 2018-11-27 MED ORDER — IPRATROPIUM-ALBUTEROL 0.5-2.5 (3) MG/3ML IN SOLN
3.0000 mL | RESPIRATORY_TRACT | Status: DC
  Administered 2018-11-27 – 2018-11-29 (×15): 3 mL via RESPIRATORY_TRACT
  Filled 2018-11-27 (×13): qty 3

## 2018-11-27 NOTE — Telephone Encounter (Signed)
-----   Message from Evans Lance, MD sent at 11/25/2018 10:32 PM EST ----- Can you refer him to our CHF clinic. GT

## 2018-11-27 NOTE — Telephone Encounter (Signed)
Per review of documentation- cardiac referral has been requested.

## 2018-11-27 NOTE — Progress Notes (Signed)
ANTICOAGULATION CONSULT NOTE - Follow Up Consult  Pharmacy Consult for warfarin Indication: atrial fibrillation  Allergies  Allergen Reactions  . Vancomycin Itching and Rash    Patient Measurements: Height: 6' (182.9 cm) Weight: 189 lb 1.6 oz (85.8 kg) IBW/kg (Calculated) : 77.6  Vital Signs: Temp: 98.3 F (36.8 C) (02/24 0544) Temp Source: Oral (02/24 0544) BP: 149/77 (02/24 0544) Pulse Rate: 85 (02/24 0544)  Labs: Recent Labs    11/26/18 1645 11/26/18 2249 11/27/18 0437  HGB 9.5*  --  9.2*  HCT 31.6*  --  30.9*  PLT 182  --  188  LABPROT 26.6*  --  28.0*  INR 2.49  --  2.66  CREATININE 1.66*  --  1.49*  TROPONINI 0.06* 0.06* 0.07*    Estimated Creatinine Clearance: 56.4 mL/min (A) (by C-G formula based on SCr of 1.49 mg/dL (H)).   Medications:  Medications Prior to Admission  Medication Sig Dispense Refill Last Dose  . acetaminophen (TYLENOL) 500 MG tablet Take 500 mg by mouth every 6 (six) hours as needed for mild pain or moderate pain.    11/25/2018  . albuterol (PROVENTIL) (2.5 MG/3ML) 0.083% nebulizer solution Take 2.5 mg by nebulization every 6 (six) hours as needed. For shortness of breath   11/25/2018  . ferrous sulfate 325 (65 FE) MG EC tablet Take 325 mg by mouth 2 (two) times daily.   11/25/2018  . insulin glargine (LANTUS) 100 UNIT/ML injection Inject 20 Units into the skin daily as needed (for high blood sugar).    11/25/2018  . Lactobacillus (ACIDOPHOLUS PO) Take 1 tablet by mouth daily.   11/25/2018  . magnesium oxide (MAG-OX) 400 MG tablet Take 400 mg by mouth 2 (two) times daily.   11/25/2018  . metolazone (ZAROXOLYN) 2.5 MG tablet Take 2.5 mg (1 tablet) on Monday, Wednesday & Friday 30 minutes prior to taking torsemide. 30 tablet 3 11/25/2018  . Multiple Vitamin (MULTIVITAMIN) tablet Take 1 tablet by mouth daily.   11/25/2018  . pantoprazole (PROTONIX) 40 MG tablet Take 1 tablet (40 mg total) by mouth daily before breakfast. 30 tablet 12 11/25/2018  .  primidone (MYSOLINE) 50 MG tablet Take 50 mg by mouth daily.    11/25/2018  . sertraline (ZOLOFT) 50 MG tablet Take 50 mg by mouth daily.   11/25/2018  . simvastatin (ZOCOR) 40 MG tablet Take 40 mg by mouth at bedtime.    11/25/2018  . torsemide (DEMADEX) 20 MG tablet Take 2 tablets (40 mg total) by mouth 2 (two) times daily. 360 tablet 3 11/25/2018  . warfarin (COUMADIN) 4 MG tablet Take 3 tablets daily or as directed (Patient taking differently: Take 12 mg by mouth every evening. ) 260 tablet 3 11/25/2018 at 2000  . albuterol (PROVENTIL HFA;VENTOLIN HFA) 108 (90 BASE) MCG/ACT inhaler Inhale 2 puffs into the lungs every 6 (six) hours as needed. For shortness of breath   Taking  . nitroGLYCERIN (NITROSTAT) 0.4 MG SL tablet Place 1 tablet (0.4 mg total) under the tongue every 5 (five) minutes x 3 doses as needed. For chest pain 25 tablet 3  at Unknown time  . ondansetron (ZOFRAN) 4 MG tablet Take 4 mg by mouth 4 (four) times daily as needed for nausea or vomiting.   Taking   Scheduled:  . ferrous sulfate  325 mg Oral BID  . furosemide  40 mg Intravenous BID  . insulin aspart  0-15 Units Subcutaneous TID WC  . ipratropium-albuterol  3 mL Nebulization Q4H  .  magnesium oxide  400 mg Oral BID  . mouth rinse  15 mL Mouth Rinse BID  . metolazone  2.5 mg Oral Once per day on Mon Wed Fri  . nitroGLYCERIN  1 inch Topical Q6H  . pantoprazole  40 mg Oral QAC breakfast  . primidone  50 mg Oral Daily  . sertraline  50 mg Oral Daily  . simvastatin  40 mg Oral QHS  . warfarin  12 mg Oral q1800  . Warfarin - Pharmacist Dosing Inpatient   Does not apply q1800   Infusions:   PRN: acetaminophen **OR** acetaminophen, albuterol, nitroGLYCERIN Anti-infectives (From admission, onward)   None      Assessment: 63 year old male on warfarin PTA for atrial fibrillation, INR therapeutic at 2.66.   Home dose of 8 mg on Wed, 12 mg ROW.   Goal of Therapy:  INR 2-3 Monitor platelets by anticoagulation protocol:  Yes   Plan:  Continue warfarin home dose of warfarin 12mg  po daily, monitor INR and CBC closely. Monitor for signs and symptoms of bleeding.   Revonda Standard Jackqulyn Mendel 11/27/2018,8:18 AM

## 2018-11-27 NOTE — Progress Notes (Signed)
CRITICAL VALUE ALERT  Critical Value:  Troponin 0.07  Date & Time Notied:  11/27/18 0650  Provider Notified: Dr. Olevia Bowens  Orders Received/Actions taken: monitor  For any s/s of distress

## 2018-11-27 NOTE — Consult Note (Signed)
Cardiology Consultation:   Patient ID: [name] MRN: [id number]; DOB: 1955-12-08  Admit date: 11/26/2018 Date of Consult: 11/27/2018  Primary Care Provider: Sinda Du, MD Primary Cardiologist: Carlyle Dolly, MD (inpatient consult 2018 and office follow-up pending) Primary Electrophysiologist:  Cristopher Peru, MD    Patient Profile:   [name] is a 63 y.o. male with a history of mixed cardiomyopathy with LVEF 20-25%, biventricular ICD in place, CAD status post DES to the LAD in 2006 with anterior infarct, paroxysmal atrial fibrillation on Coumadin, hypertension, hyperlipidemia, tobacco abuse, and type 2 diabetes mellitus with neuropathy who is being seen today for the evaluation of CHF at the request of Dr. Luan Pulling.  History of Present Illness:   [name] is a 63 year old male patient of Dr. Lovena Le and Dr. Harl Bowie currently admitted to the hospital with increasing shortness of breath and leg swelling, presented with pulmonary edema by chest x-ray and is being managed currently with IV diuresis.  He was admitted to the hospital in January with heart failure exacerbation as well.  He states that he has been experiencing orthopnea for the last 2 weeks despite increase in outpatient diuretics, has baseline bilateral leg swelling that has been chronic and also somewhat progressive.  He does not weigh himself daily but states that his usual weight is around 175 pounds which is what was recorded back in June 2019.  If this is the case, he is up 15 to 20 pounds based on more recent weights.  Patient saw Dr. Lovena Le recently on 11/21/2018.  He stopped his Coreg and increased torsemide to 40 mg twice daily and recommended CHF clinic evaluation.  Patient was 100% RV pacing at the last device check.  [name] does not report any active angina symptoms, no palpitations or device shocks, no syncope.  He states that he takes his medications regularly.  He has not been on ACE inhibitor or  ARB.   Past Medical History:  Diagnosis Date  . Arthritis   . Biventricular ICD (implantable cardioverter-defibrillator) in place   . CAD (coronary artery disease)    Acute MI in 2006 -> LAD stent  . Cardiomyopathy ([place])   . Carpal tunnel syndrome   . Chronic systolic (congestive) heart failure ([place])   . Colon cancer ([place]) 2006   Partial colectomy in 2006  . Complete heart block ([place])    S/P ICD 02/09/13  . COPD (chronic obstructive pulmonary disease) ([place])   . Diabetes mellitus, type II ([place])   . H/O alcohol dependence ([place])   . H/O hiatal hernia   . History of pneumonia 2013  . Hyperlipidemia   . Hypertension   . Myocardial infarction [facility])    Hx: of 2006  . Neuropathy in diabetes ([place])   . PAF (paroxysmal atrial fibrillation) ([place])     Past Surgical History:  Procedure Laterality Date  . ANKLE SURGERY     Right  . Biventricular ICD Placement  02/09/2013   Dr. Lovena Le  . COLONOSCOPY  08/17/2005   Pancolonic diverticula/Multiple rectal polyps removed as described above. The larger of the two likely responsible for intermittent hematochezia/ Multiple polyps at the hepatic flexure resected with a snare./ Large polypoid lesion growing out of the base of the cecum, just behind the  ileocecal valve. This was not felt to be amendable to endoscopic resection. Biopsied multiple times  . CORONARY ANGIOPLASTY WITH STENT PLACEMENT    . ESOPHAGOGASTRODUODENOSCOPY N/A 03/29/2017   Barrett's esophagus, gastritis, duodenitis, focal glandular atypia  on biopsy. Needs surveillance June 2019   . FLEXIBLE SIGMOIDOSCOPY    06/13/2006   Essentially normal residual rectum status post total colectomy. anastomosis at 25cm.  Focal area of abnormality adjacent to suture, as described above, likely not significant, suspect granulation tissue, biopsied.  Anastomosis otherwise appeared normal.  Distal terminal ileum appeared normal  . FLEXIBLE SIGMOIDOSCOPY N/A 01/13/2015   Dr. Gala Romney: normal-appearing  residual rectum, surgical anastomosis, s/p subtotal colectomy. Surveillance 5 years   . FLEXIBLE SIGMOIDOSCOPY N/A 03/29/2017   Dr. Oneida Alar while inpatient: intermittent rectal bleeding due to ischemic erosion at anastomosis,s/p APC therapy. one 3 mm polyp in recum (hyperplastic). surveillance in 3 years  . INGUINAL HERNIA REPAIR    . MULTIPLE EXTRACTIONS WITH ALVEOLOPLASTY N/A 04/09/2013   Procedure: Extraction of tooth #'s 1,2,4,5,6,7,8,9,10,11,12,13,17,18,20,21,22,23,27,28, 29,30,31, and 32 with alveoloplasty.;  Surgeon: Lenn Cal, DDS;  Location: [facility];  Service: Oral Surgery;  Laterality: N/A;  . PERMANENT PACEMAKER INSERTION N/A 02/09/2013   Procedure: PERMANENT PACEMAKER INSERTION;  Surgeon: Evans Lance, MD;  Location[facility] CATH LAB;  Service: Cardiovascular;  Laterality: N/A;  . SUBTOTAL COLECTOMY  09/01/2005   subtotal colectomy  . TEMPORARY PACEMAKER INSERTION Right 02/09/2013   Procedure: TEMPORARY PACEMAKER INSERTION;  Surgeon: Sinclair Grooms, MD;  Location[facility] CATH LAB;  Service: Cardiovascular;  Laterality: Right;     Home Medications:  Prior to Admission medications   Medication Sig Start Date End Date Taking? Authorizing Provider  acetaminophen (TYLENOL) 500 MG tablet Take 500 mg by mouth every 6 (six) hours as needed for mild pain or moderate pain.  01/08/14  Yes [provider]  albuterol (PROVENTIL) (2.5 MG/3ML) 0.083% nebulizer solution Take 2.5 mg by nebulization every 6 (six) hours as needed. For shortness of breath   Yes [provider]  ferrous sulfate 325 (65 FE) MG EC tablet Take 325 mg by mouth 2 (two) times daily.   Yes [provider]  insulin glargine (LANTUS) 100 UNIT/ML injection Inject 20 Units into the skin daily as needed (for high blood sugar).    Yes [provider]  Lactobacillus (ACIDOPHOLUS PO) Take 1 tablet by mouth daily.   Yes [provider]  magnesium oxide (MAG-OX) 400 MG tablet Take 400 mg by mouth 2  (two) times daily.   Yes [provider]  metolazone (ZAROXOLYN) 2.5 MG tablet Take 2.5 mg (1 tablet) on Monday, Wednesday & Friday 30 minutes prior to taking torsemide. 11/15/18  Yes Evans Lance, MD  Multiple Vitamin (MULTIVITAMIN) tablet Take 1 tablet by mouth daily.   Yes [provider]  pantoprazole (PROTONIX) 40 MG tablet Take 1 tablet (40 mg total) by mouth daily before breakfast. 03/31/17  Yes Sinda Du, MD  primidone (MYSOLINE) 50 MG tablet Take 50 mg by mouth daily.    Yes [provider]  sertraline (ZOLOFT) 50 MG tablet Take 50 mg by mouth daily.   Yes [provider]  simvastatin (ZOCOR) 40 MG tablet Take 40 mg by mouth at bedtime.    Yes [provider]  torsemide (DEMADEX) 20 MG tablet Take 2 tablets (40 mg total) by mouth 2 (two) times daily. 11/21/18 02/18/2019 Yes Evans Lance, MD  warfarin (COUMADIN) 4 MG tablet Take 3 tablets daily or as directed Patient taking differently: Take 12 mg by mouth every evening.  09/18/18  Yes BranchAlphonse Guild, MD  albuterol (PROVENTIL HFA;VENTOLIN HFA) 108 (90 BASE) MCG/ACT inhaler Inhale 2 puffs into the lungs every  6 (six) hours as needed. For shortness of breath    [provider]  nitroGLYCERIN (NITROSTAT) 0.4 MG SL tablet Place 1 tablet (0.4 mg total) under the tongue every 5 (five) minutes x 3 doses as needed. For chest pain 03/08/18   Evans Lance, MD  ondansetron (ZOFRAN) 4 MG tablet Take 4 mg by mouth 4 (four) times daily as needed for nausea or vomiting.    [provider]    Inpatient Medications: Scheduled Meds: . ferrous sulfate  325 mg Oral BID  . furosemide  40 mg Intravenous BID  . insulin aspart  0-15 Units Subcutaneous TID WC  . ipratropium-albuterol  3 mL Nebulization Q4H  . magnesium oxide  400 mg Oral BID  . mouth rinse  15 mL Mouth Rinse BID  . metolazone  2.5 mg Oral Once per day on Mon Wed Fri  . nitroGLYCERIN  1 inch Topical Q6H  . pantoprazole   40 mg Oral QAC breakfast  . primidone  50 mg Oral Daily  . sertraline  50 mg Oral Daily  . simvastatin  40 mg Oral QHS  . warfarin  12 mg Oral Once  . Warfarin - Pharmacist Dosing Inpatient   Does not apply q1800    PRN Meds: acetaminophen **OR** acetaminophen, albuterol, nitroGLYCERIN  Allergies:    Allergies  Allergen Reactions  . Vancomycin Itching and Rash    Social History:   Social History   Socioeconomic History  . Marital status: Married    Spouse name: Not on file  . Number of children: 1  . Years of education: Not on file  . Highest education level: Not on file  Occupational History  . Occupation: Writer    Comment: Disabled  Social Needs  . Financial resource strain: Not on file  . Food insecurity:    Worry: Not on file    Inability: Not on file  . Transportation needs:    Medical: Not on file    Non-medical: Not on file  Tobacco Use  . Smoking status: Current Every Day Smoker    Packs/day: 0.10    Years: 40.00    Pack years: 4.00    Types: Cigarettes    Start date: 07/13/1971    Last attempt to quit: 11/13/2018    Years since quitting: 0.0  . Smokeless tobacco: Never Used  Substance and Sexual Activity  . Alcohol use: No    Alcohol/week: 0.0 standard drinks  . Drug use: No  . Sexual activity: Yes  Lifestyle  . Physical activity:    Days per week: Not on file    Minutes per session: Not on file  . Stress: Not on file  Relationships  . Social connections:    Talks on phone: Not on file    Gets together: Not on file    Attends religious service: Not on file    Active member of club or organization: Not on file    Attends meetings of clubs or organizations: Not on file    Relationship status: Not on file  . Intimate partner violence:    Fear of current or ex partner: Not on file    Emotionally abused: Not on file    Physically abused: Not on file    Forced sexual activity: Not on file  Other Topics Concern  . Not on file  Social  History Narrative  . Not on file    Family History:    Family History  Problem  Relation Age of Onset  . Colon cancer Mother        Rakeen Gaillard, ?>age 89.  [name] Heart disease Mother   . Heart disease Father   . Hypertension Brother   . Heart disease Brother   . Diabetes Brother   . Liver disease Neg Hx      ROS:  Please see the history of present illness. All other ROS reviewed and negative.     Physical Exam/Data:   Vitals:   11/27/18 0544 11/27/18 0700 11/27/18 0859 11/27/18 0900  BP: (!) 149/77  130/62   Pulse: 85  87   Resp: 19     Temp: 98.3 F (36.8 C)  98.2 F (36.8 C)   TempSrc: Oral  Oral   SpO2: 98% 96% (!) 72% 94%  Weight:      Height:        Intake/Output Summary (Last 24 hours) at 11/27/2018 1123 Last data filed at 11/27/2018 1007 Gross per 24 hour  Intake 240 ml  Output 2550 ml  Net -2310 ml   Last 3 Weights 11/27/2018 11/26/2018 11/26/2018  Weight (lbs) 189 lb 1.6 oz 192 lb 180 lb  Weight (kg) 85.775 kg 87.091 kg 81.647 kg     Body mass index is 25.65 kg/m.  General: Chronically ill-appearing male in no distress.  Somewhat disheveled. HEENT: Conjunctiva and lids normal, oropharynx clear with poor dentition. Neck: Supple, elevated JVP noted, no carotid bruits, no thyromegaly. Lungs: Decreased basilar breath sounds consistent with pleural effusions, nonlabored breathing at rest. Cardiac: Regular rate and rhythm, no S3, soft systolic murmur, PMI is indistinct. Abdomen: Soft, nontender, bowel sounds present. Extremities: Chronic appearing lower extremity edema and venous stasis with weeping noted, distal pulses diminished. Skin: Warm and dry. Musculoskeletal: No kyphosis. Neuropsychiatric: Alert and oriented x3, affect grossly appropriate.  EKG:  The EKG was personally reviewed and demonstrates: Ventricular pacing with apparent atrial sensing as well as independently ventricular paced beats.  Telemetry:  Telemetry was personally reviewed and  demonstrates: Ventricular paced rhythm with atrial sensing, burst of NSVT.  Relevant CV Studies:  Echocardiogram 09/07/2018:   - Left ventricle: The cavity size was severely dilated. Systolic   function was severely reduced. The estimated ejection fraction   was in the range of 20% to 25%. Diffuse hypokinesis. Features are   consistent with a pseudonormal left ventricular filling pattern,   with concomitant abnormal relaxation and increased filling   pressure (grade 2 diastolic dysfunction). Doppler parameters are   consistent with elevated ventricular end-diastolic filling   pressure. - Ventricular septum: Septal motion showed abnormal function,   dyssynergy, and paradox. - Aortic valve: There was mild stenosis. There was trivial   regurgitation. Valve area (VTI): 2.04 cm^2. Valve area (Vmax):   2.09 cm^2. Valve area (Vmean): 1.98 cm^2. - Mitral valve: There was moderate regurgitation. - Left atrium: The atrium was severely dilated. - Right ventricle: The cavity size was moderately dilated. Wall   thickness was normal. Systolic function was mildly reduced. - Right atrium: The atrium was severely dilated. - Tricuspid valve: There was moderate regurgitation. - Pulmonic valve: There was moderate regurgitation. - Pulmonary arteries: Systolic pressure was moderately increased.   PA peak pressure: 50 mm Hg (S). - Inferior vena cava: The vessel was dilated. The respirophasic   diameter changes were blunted (< 50%), consistent with elevated   central venous pressure. - Pericardium, extracardiac: There was no pericardial effusion.   Laboratory Data:  Chemistry Recent Labs  Lab 11/21/18  0935 11/26/18 1645 11/27/18 0437  NA 133* 132* 135  K 4.6 4.2 4.1  CL 94* 92* 94*  CO2 31 31 30   GLUCOSE 289* 321* 178*  BUN 37* 31* 27*  CREATININE 1.87* 1.66* 1.49*  CALCIUM 8.5* 8.3* 8.5*  GFRNONAA 38* 44* 50*  GFRAA 44* 50* 57*  ANIONGAP 8 9 11     Recent Labs  Lab 11/26/18 1645  PROT  6.2*  ALBUMIN 3.0*  AST 26  ALT 24  ALKPHOS 202*  BILITOT 0.6   Hematology Recent Labs  Lab 11/26/18 1645 11/27/18 0437  WBC 5.3 6.5  RBC 3.22* 3.16*  HGB 9.5* 9.2*  HCT 31.6* 30.9*  MCV 98.1 97.8  MCH 29.5 29.1  MCHC 30.1 29.8*  RDW 14.6 14.6  PLT 182 188   Cardiac Enzymes Recent Labs  Lab 11/26/18 1645 11/26/18 2249 11/27/18 0437  TROPONINI 0.06* 0.06* 0.07*   No results for input(s): TROPIPOC in the last 168 hours.  BNP Recent Labs  Lab 11/26/18 1645  BNP 1,629.0*    Radiology/Studies:  Dg Chest 2 View  Result Date: 11/26/2018 CLINICAL DATA:  Shortness of breath.  Lower extremity edema. EXAM: CHEST - 2 VIEW COMPARISON:  PA and lateral chest 10/16/2018 and 05/25/2017. FINDINGS: There is cardiomegaly and interstitial edema. Small right pleural effusion has increased since the most recent examination. Pacing device is in place. No pneumothorax. No acute or focal bony abnormality. IMPRESSION: Cardiomegaly and increased interstitial pulmonary edema. Small right pleural effusion has also increased since the most recent study. Electronically Signed   By: Inge Rise M.D.   On: 11/26/2018 17:12    Assessment and Plan:   1.  Acute on chronic combined heart failure with fluid excess of potentially 15 to 20 pounds.  Patient has been managed by Dr. Lovena Le most recently with advancing diuretic dosing and discontinuation of Coreg.  2.  Mixed cardiomyopathy with LVEF 20 to 25% by echocardiogram in December 2019.  Diffuse LV hypokinesis noted with grade 2 diastolic dysfunction and increased filling pressures.  Also noted to have mild right ventricular dysfunction and moderate pulmonary hypertension.  3.  CAD with prior history of anterior infarct in 2006 managed with DES to the LAD.  No active angina symptoms.  He is not on aspirin as an outpatient due to concurrent use of Coumadin.  Also on statin therapy.  Troponin I levels are minimally increased and in flat pattern more  consistent with heart failure than ACS.  4.  Probable CKD stage III at baseline.  5.  [employer] biventricular ICD in place with follow-up by Dr. Lovena Le.  Patient has history of complete heart block.  6.  Paroxysmal atrial fibrillation on Coumadin.  Currently in sinus rhythm.  7.  Chronic anemia.  Agree with IV Lasix 40 mg twice daily as he is having good diuresis and actually renal function is improving somewhat.  Reassess urine output tomorrow.  He has had approximately 2 L out more than in so far but remains significantly fluid overloaded.  Also on metolazone 3 times a week, can continue as well.  We will not reinitiate Coreg until his volume status is at baseline.  Ideally, depending on degree of renal insufficiency, we will plan to start him on Entresto +/- aldactone and then determine baseline diuretic regimen.  If he cannot tolerate this due to renal insufficiency, combination of hydralazine/nitrates can be considered.  Signed, Rozann Lesches, MD  11/27/2018 11:23 AM

## 2018-11-27 NOTE — Progress Notes (Signed)
Subjective: He was admitted with pulmonary edema.  For the last month or so as an outpatient he has been struggling with swelling.  He has had multiple adjustments made in his medications.  His potassium level had been borderline.  He has been back and forth to his cardiologist and into my office.  He developed more swelling more shortness of breath and came to the emergency department where he was found to have pulmonary edema.  He has multiple health problems at baseline including chronic atrial fib, chronic anemia, coronary disease, chronic combined systolic and diastolic heart failure, complete heart block with AICD placement, history of colon cancer with partial colectomy in 2006, COPD, tobacco abuse hypertension history of alcohol abuse diabetes.  He has had adjustments made in the dose of his diuretics without much help.  He says he feels better this morning.  He was able to lie flat after about 4:00 this morning.  He is down about 2 L  Objective: Vital signs in last 24 hours: Temp:  [98 F (36.7 C)-98.3 F (36.8 C)] 98.3 F (36.8 C) (02/24 0544) Pulse Rate:  [83-99] 85 (02/24 0544) Resp:  [15-40] 19 (02/24 0544) BP: (142-161)/(73-87) 149/77 (02/24 0544) SpO2:  [90 %-100 %] 96 % (02/24 0700) FiO2 (%):  [21 %] 21 % (02/23 1941) Weight:  [81.6 kg-87.1 kg] 85.8 kg (02/24 0500) Weight change:  Last BM Date: 11/26/18  Intake/Output from previous day: 02/23 0701 - 02/24 0700 In: -  Out: 2050 [Urine:2050]  PHYSICAL EXAM General appearance: alert, cooperative and mild distress Resp: rales bilaterally Cardio: irregularly irregular rhythm GI: He has edema of the scrotum Extremities: He still has 2+ edema but he also has chronic venous stasis  Lab Results:  Results for orders placed or performed during the hospital encounter of 11/26/18 (from the past 48 hour(s))  Brain natriuretic peptide     Status: Abnormal   Collection Time: 11/26/18  4:45 PM  Result Value Ref Range   B  Natriuretic Peptide 1,629.0 (H) 0.0 - 100.0 pg/mL    Comment: Performed at [facility], [address], [place], [place] [address]  CBC with Differential     Status: Abnormal   Collection Time: 11/26/18  4:45 PM  Result Value Ref Range   WBC 5.3 4.0 - 10.5 K/uL   RBC 3.22 (L) 4.22 - 5.81 MIL/uL   Hemoglobin 9.5 (L) 13.0 - 17.0 g/dL   HCT 31.6 (L) 39.0 - 52.0 %   MCV 98.1 80.0 - 100.0 fL   MCH 29.5 26.0 - 34.0 pg   MCHC 30.1 30.0 - 36.0 g/dL   RDW 14.6 11.5 - 15.5 %   Platelets 182 150 - 400 K/uL   nRBC 0.0 0.0 - 0.2 %   Neutrophils Relative % 72 %   Neutro Abs 3.8 1.7 - 7.7 K/uL   Lymphocytes Relative 9 %   Lymphs Abs 0.5 (L) 0.7 - 4.0 K/uL   Monocytes Relative 7 %   Monocytes Absolute 0.4 0.1 - 1.0 K/uL   Eosinophils Relative 12 %   Eosinophils Absolute 0.7 (H) 0.0 - 0.5 K/uL   Basophils Relative 0 %   Basophils Absolute 0.0 0.0 - 0.1 K/uL   Immature Granulocytes 0 %   Abs Immature Granulocytes 0.01 0.00 - 0.07 K/uL    Comment: Performed at [facility], [address], [place], [place] [address]  Comprehensive metabolic panel     Status: Abnormal   Collection Time: 11/26/18  4:45 PM  Result  Value Ref Range   Sodium 132 (L) 135 - 145 mmol/L   Potassium 4.2 3.5 - 5.1 mmol/L   Chloride 92 (L) 98 - 111 mmol/L   CO2 31 22 - 32 mmol/L   Glucose, Bld 321 (H) 70 - 99 mg/dL   BUN 31 (H) 8 - 23 mg/dL   Creatinine, Ser 1.66 (H) 0.61 - 1.24 mg/dL   Calcium 8.3 (L) 8.9 - 10.3 mg/dL   Total Protein 6.2 (L) 6.5 - 8.1 g/dL   Albumin 3.0 (L) 3.5 - 5.0 g/dL   AST 26 15 - 41 U/L   ALT 24 0 - 44 U/L   Alkaline Phosphatase 202 (H) 38 - 126 U/L   Total Bilirubin 0.6 0.3 - 1.2 mg/dL   GFR calc non Af Amer 44 (L) >60 mL/min   GFR calc Af Amer 50 (L) >60 mL/min   Anion gap 9 5 - 15    Comment: Performed at [facility], [address] [place], [place] [address]  Troponin I - Once     Status: Abnormal   Collection Time: 11/26/18  4:45 PM  Result Value Ref Range   Troponin I 0.06 (HH) <0.03  ng/mL    Comment: CRITICAL RESULT CALLED TO, READ BACK BY AND VERIFIED WITH: CREWS.M. AT 0539 ON 11/26/2018 Performed at [facility], [address], [place], [place] [address]   Protime-INR     Status: Abnormal   Collection Time: 11/26/18  4:45 PM  Result Value Ref Range   Prothrombin Time 26.6 (H) 11.4 - 15.2 seconds   INR 2.49     Comment: Performed at [facility], [address], [place], [place] [address]  Magnesium     Status: Abnormal   Collection Time: 11/26/18  4:45 PM  Result Value Ref Range   Magnesium 2.5 (H) 1.7 - 2.4 mg/dL    Comment: Performed at [facility], [address], [place], [place] [address]  Phosphorus     Status: Abnormal   Collection Time: 11/26/18  4:45 PM  Result Value Ref Range   Phosphorus 4.9 (H) 2.5 - 4.6 mg/dL    Comment: Performed at [facility], [address] [place], [place] [address]  Glucose, capillary     Status: Abnormal   Collection Time: 11/26/18  9:33 PM  Result Value Ref Range   Glucose-Capillary 256 (H) 70 - 99 mg/dL   Comment 1 Notify RN    Comment 2 Document in Chart   Troponin I - Now Then Q6H     Status: Abnormal   Collection Time: 11/26/18 10:49 PM  Result Value Ref Range   Troponin I 0.06 (HH) <0.03 ng/mL    Comment: CRITICAL VALUE NOTED.  VALUE IS CONSISTENT WITH PREVIOUSLY REPORTED AND CALLED VALUE. Performed at [facility], [address] [place], [place] [address]   Troponin I - Now Then Q6H     Status: Abnormal   Collection Time: 11/27/18  4:37 AM  Result Value Ref Range   Troponin I 0.07 (HH) <0.03 ng/mL    Comment: CRITICAL RESULT CALLED TO, READ BACK BY AND VERIFIED WITH: BONDURANT,R AT 6:55AM ON 11/27/18 BY [facility] Performed at [facility], [address], [place], [place] [address]   Basic metabolic panel     Status: Abnormal   Collection Time: 11/27/18  4:37 AM  Result Value Ref Range   Sodium 135 135 - 145 mmol/L   Potassium 4.1 3.5 - 5.1 mmol/L   Chloride 94 (L) 98 - 111  mmol/L   CO2 30 22 - 32  mmol/L   Glucose, Bld 178 (H) 70 - 99 mg/dL   BUN 27 (H) 8 - 23 mg/dL   Creatinine, Ser 1.49 (H) 0.61 - 1.24 mg/dL   Calcium 8.5 (L) 8.9 - 10.3 mg/dL   GFR calc non Af Amer 50 (L) >60 mL/min   GFR calc Af Amer 57 (L) >60 mL/min   Anion gap 11 5 - 15    Comment: Performed at [facility], [address], [place], [place] [address]  CBC     Status: Abnormal   Collection Time: 11/27/18  4:37 AM  Result Value Ref Range   WBC 6.5 4.0 - 10.5 K/uL   RBC 3.16 (L) 4.22 - 5.81 MIL/uL   Hemoglobin 9.2 (L) 13.0 - 17.0 g/dL   HCT 30.9 (L) 39.0 - 52.0 %   MCV 97.8 80.0 - 100.0 fL   MCH 29.1 26.0 - 34.0 pg   MCHC 29.8 (L) 30.0 - 36.0 g/dL   RDW 14.6 11.5 - 15.5 %   Platelets 188 150 - 400 K/uL   nRBC 0.0 0.0 - 0.2 %    Comment: Performed at [facility], [address] [place], [place] [address]  Protime-INR     Status: Abnormal   Collection Time: 11/27/18  4:37 AM  Result Value Ref Range   Prothrombin Time 28.0 (H) 11.4 - 15.2 seconds   INR 2.66     Comment: Performed at [facility], [address], [place], [place] [address]  Glucose, capillary     Status: Abnormal   Collection Time: 11/27/18  7:13 AM  Result Value Ref Range   Glucose-Capillary 161 (H) 70 - 99 mg/dL   Comment 1 Notify RN    Comment 2 Document in Chart     ABGS No results for input(s): PHART, PO2ART, TCO2, HCO3 in the last 72 hours.  Invalid input(s): PCO2 CULTURES No results found for this or any previous visit (from the past 240 hour(s)). Studies/Results: Dg Chest 2 View  Result Date: 11/26/2018 CLINICAL DATA:  Shortness of breath.  Lower extremity edema. EXAM: CHEST - 2 VIEW COMPARISON:  PA and lateral chest 10/16/2018 and 05/25/2017. FINDINGS: There is cardiomegaly and interstitial edema. Small right pleural effusion has increased since the most recent examination. Pacing device is in place. No pneumothorax. No acute or focal bony abnormality. IMPRESSION: Cardiomegaly and increased interstitial pulmonary edema.  Small right pleural effusion has also increased since the most recent study. Electronically Signed   By: Inge Rise M.D.   On: 11/26/2018 17:12    Medications:  Prior to Admission:  Medications Prior to Admission  Medication Sig Dispense Refill Last Dose  . acetaminophen (TYLENOL) 500 MG tablet Take 500 mg by mouth every 6 (six) hours as needed for mild pain or moderate pain.    11/25/2018  . albuterol (PROVENTIL) (2.5 MG/3ML) 0.083% nebulizer solution Take 2.5 mg by nebulization every 6 (six) hours as needed. For shortness of breath   11/25/2018  . ferrous sulfate 325 (65 FE) MG EC tablet Take 325 mg by mouth 2 (two) times daily.   11/25/2018  . insulin glargine (LANTUS) 100 UNIT/ML injection Inject 20 Units into the skin daily as needed (for high blood sugar).    11/25/2018  . Lactobacillus (ACIDOPHOLUS PO) Take 1 tablet by mouth daily.   11/25/2018  . magnesium oxide (MAG-OX) 400 MG tablet Take 400 mg by mouth 2 (two) times daily.   11/25/2018  .  metolazone (ZAROXOLYN) 2.5 MG tablet Take 2.5 mg (1 tablet) on Monday, Wednesday & Friday 30 minutes prior to taking torsemide. 30 tablet 3 11/25/2018  . Multiple Vitamin (MULTIVITAMIN) tablet Take 1 tablet by mouth daily.   11/25/2018  . pantoprazole (PROTONIX) 40 MG tablet Take 1 tablet (40 mg total) by mouth daily before breakfast. 30 tablet 12 11/25/2018  . primidone (MYSOLINE) 50 MG tablet Take 50 mg by mouth daily.    11/25/2018  . sertraline (ZOLOFT) 50 MG tablet Take 50 mg by mouth daily.   11/25/2018  . simvastatin (ZOCOR) 40 MG tablet Take 40 mg by mouth at bedtime.    11/25/2018  . torsemide (DEMADEX) 20 MG tablet Take 2 tablets (40 mg total) by mouth 2 (two) times daily. 360 tablet 3 11/25/2018  . warfarin (COUMADIN) 4 MG tablet Take 3 tablets daily or as directed (Patient taking differently: Take 12 mg by mouth every evening. ) 260 tablet 3 11/25/2018 at 2000  . albuterol (PROVENTIL HFA;VENTOLIN HFA) 108 (90 BASE) MCG/ACT inhaler Inhale 2 puffs  into the lungs every 6 (six) hours as needed. For shortness of breath   Taking  . nitroGLYCERIN (NITROSTAT) 0.4 MG SL tablet Place 1 tablet (0.4 mg total) under the tongue every 5 (five) minutes x 3 doses as needed. For chest pain 25 tablet 3  at Unknown time  . ondansetron (ZOFRAN) 4 MG tablet Take 4 mg by mouth 4 (four) times daily as needed for nausea or vomiting.   Taking   Scheduled: . ferrous sulfate  325 mg Oral BID  . furosemide  40 mg Intravenous BID  . insulin aspart  0-15 Units Subcutaneous TID WC  . ipratropium-albuterol  3 mL Nebulization Q4H  . magnesium oxide  400 mg Oral BID  . mouth rinse  15 mL Mouth Rinse BID  . metolazone  2.5 mg Oral Once per day on Mon Wed Fri  . nitroGLYCERIN  1 inch Topical Q6H  . pantoprazole  40 mg Oral QAC breakfast  . primidone  50 mg Oral Daily  . sertraline  50 mg Oral Daily  . simvastatin  40 mg Oral QHS  . warfarin  12 mg Oral Once  . Warfarin - Pharmacist Dosing Inpatient   Does not apply q1800   Continuous:  YIR:[id number] **OR** acetaminophen, albuterol, nitroGLYCERIN  Assesment: He was admitted with acute on chronic combined systolic and diastolic heart failure.  He has been being treated as an outpatient but it has not been successful in keeping him from having increased fluid retention.  He has very poor systolic heart function at baseline with echocardiogram done in December of last year showing ejection fraction of 20 to 25%.  He has chronic atrial fib which is stable  He has coronary disease but has not had any chest pain associated with this  He has COPD and had been smoking up until about 2 weeks ago.  He has chronic hypoxic respiratory failure and is on oxygen  He has diabetes on sliding scale  He has chronic renal failure and 1 of the issues is been that when he is diuresed effectively he frequently develops worsening renal function. Principal Problem:   Acute on chronic combined systolic and diastolic congestive  heart failure (HCC) Active Problems:   COPD (chronic obstructive pulmonary disease) (HCC)   Arteriosclerotic cardiovascular disease (ASCVD)   Diabetes mellitus, type II ([place])   Hypertension   Hyperlipidemia   Tobacco abuse   Atrial fibrillation ([place])  IDA (iron deficiency anemia)   Barrett's esophagus   Elevated troponin    Plan: Continue treatments.  Continue Lasix IV.  I will ask for inpatient cardiology consultation.    LOS: 0 days   [name] 11/27/2018, 8:27 AM

## 2018-11-27 NOTE — Telephone Encounter (Signed)
Per phone call from pt's wife- pt wanted Dr. Lovena Le to know he's admitted here at [facility], that he tried to get them to send him to [facility] and they wouldn't.

## 2018-11-27 NOTE — Telephone Encounter (Signed)
Will forward to Dr. Taylor.  

## 2018-11-28 ENCOUNTER — Inpatient Hospital Stay: Payer: Self-pay

## 2018-11-28 ENCOUNTER — Inpatient Hospital Stay (HOSPITAL_COMMUNITY): Payer: Medicare HMO

## 2018-11-28 DIAGNOSIS — I429 Cardiomyopathy, unspecified: Secondary | ICD-10-CM

## 2018-11-28 LAB — BASIC METABOLIC PANEL
Anion gap: 9 (ref 5–15)
BUN: 34 mg/dL — ABNORMAL HIGH (ref 8–23)
CALCIUM: 8.6 mg/dL — AB (ref 8.9–10.3)
CO2: 32 mmol/L (ref 22–32)
Chloride: 95 mmol/L — ABNORMAL LOW (ref 98–111)
Creatinine, Ser: 1.64 mg/dL — ABNORMAL HIGH (ref 0.61–1.24)
GFR, EST AFRICAN AMERICAN: 51 mL/min — AB (ref >60)
GFR, EST NON AFRICAN AMERICAN: 44 mL/min — AB (ref >60)
Glucose, Bld: 168 mg/dL — ABNORMAL HIGH (ref 70–99)
Potassium: 4.2 mmol/L (ref 3.5–5.1)
Sodium: 136 mmol/L (ref 135–145)

## 2018-11-28 LAB — COOXEMETRY PANEL
Carboxyhemoglobin: 2.2 % — ABNORMAL HIGH (ref 0.5–1.5)
Methemoglobin: 0.7 % (ref 0.0–1.5)
O2 Saturation: 61.8 %
Total hemoglobin: 9.5 g/dL — ABNORMAL LOW (ref 12.0–16.0)

## 2018-11-28 LAB — PROTIME-INR
INR: 2.9 — ABNORMAL HIGH (ref 0.8–1.2)
Prothrombin Time: 30 seconds — ABNORMAL HIGH (ref 11.4–15.2)

## 2018-11-28 LAB — GLUCOSE, CAPILLARY
GLUCOSE-CAPILLARY: 247 mg/dL — AB (ref 70–99)
Glucose-Capillary: 184 mg/dL — ABNORMAL HIGH (ref 70–99)
Glucose-Capillary: 123 mg/dL — ABNORMAL HIGH (ref 70–99)
Glucose-Capillary: 428 mg/dL — ABNORMAL HIGH (ref 70–99)

## 2018-11-28 MED ORDER — INSULIN ASPART 100 UNIT/ML ~~LOC~~ SOLN
12.0000 [IU] | Freq: Once | SUBCUTANEOUS | Status: AC
  Administered 2018-11-29: 12 [IU] via SUBCUTANEOUS

## 2018-11-28 MED ORDER — PREDNISONE 20 MG PO TABS
40.0000 mg | ORAL_TABLET | Freq: Every day | ORAL | Status: DC
  Administered 2018-11-28 – 2018-11-29 (×2): 40 mg via ORAL
  Filled 2018-11-28 (×2): qty 2

## 2018-11-28 MED ORDER — SODIUM CHLORIDE 0.9% FLUSH: 10.0000 mL | INTRAVENOUS | Status: DC | PRN

## 2018-11-28 MED ORDER — SODIUM CHLORIDE 0.9% FLUSH
10.0000 mL | Freq: Two times a day (BID) | INTRAVENOUS | Status: DC
  Administered 2018-11-28 – 2018-12-08 (×13): 10 mL

## 2018-11-28 MED ORDER — FUROSEMIDE 10 MG/ML IJ SOLN
80.0000 mg | Freq: Two times a day (BID) | INTRAMUSCULAR | Status: DC
  Administered 2018-11-28 – 2018-11-29 (×3): 80 mg via INTRAVENOUS
  Filled 2018-11-28 (×2): qty 8

## 2018-11-28 MED ORDER — WARFARIN SODIUM 5 MG PO TABS
8.0000 mg | ORAL_TABLET | Freq: Once | ORAL | Status: AC
  Administered 2018-11-28: 8 mg via ORAL
  Filled 2018-11-28: qty 1

## 2018-11-28 MED ORDER — HYDRALAZINE HCL 25 MG PO TABS
12.5000 mg | ORAL_TABLET | Freq: Three times a day (TID) | ORAL | Status: DC
  Administered 2018-11-28 – 2018-11-29 (×4): 12.5 mg via ORAL
  Filled 2018-11-28 (×4): qty 1

## 2018-11-28 MED ORDER — FUROSEMIDE 10 MG/ML IJ SOLN
80.0000 mg | Freq: Two times a day (BID) | INTRAMUSCULAR | Status: DC
  Filled 2018-11-28: qty 8

## 2018-11-28 NOTE — Progress Notes (Signed)
Peripherally Inserted Central Catheter/Midline Placement  The IV Nurse has discussed with the patient and/or persons authorized to consent for the patient, the purpose of this procedure and the potential benefits and risks involved with this procedure.  The benefits include less needle sticks, lab draws from the catheter, and the patient may be discharged home with the catheter. Risks include, but not limited to, infection, bleeding, blood clot (thrombus formation), and puncture of an artery; nerve damage and irregular heartbeat and possibility to perform a PICC exchange if needed/ordered by physician.  Alternatives to this procedure were also discussed.  Bard Power PICC patient education guide, fact sheet on infection prevention and patient information card has been provided to patient /or left at bedside.    PICC/Midline Placement Documentation  PICC Double Lumen 11/28/18 PICC Right Brachial 38 cm 0 cm (Active)  Indication for Insertion or Continuance of Line Prolonged intravenous therapies 11/28/2018 12:27 PM  Exposed Catheter (cm) 0 cm 11/28/2018 12:27 PM  Site Assessment Dry;Clean;Intact 11/28/2018 12:27 PM  Lumen #1 Status Flushed;Blood return noted 11/28/2018 12:27 PM  Lumen #2 Status Flushed;Blood return noted 11/28/2018 12:27 PM  Dressing Type Transparent 11/28/2018 12:27 PM  Dressing Status Clean;Dry;Intact;Antimicrobial disc in place 11/28/2018 12:27 PM  Dressing Intervention New dressing 11/28/2018 12:27 PM  Dressing Change Due 12/05/18 11/28/2018 12:27 PM       [name] 11/28/2018, 12:28 PM

## 2018-11-28 NOTE — Progress Notes (Signed)
Progress Note  Patient Name: [name] Date of Encounter: 11/28/2018  Primary Cardiologist: Dr. Carlyle Dolly  Subjective   Still with significant orthopnea, shortness of breath.  No chest pain or sense of palpitations.  Appetite limited.  Inpatient Medications    Scheduled Meds: . ferrous sulfate  325 mg Oral BID  . furosemide  80 mg Intravenous BID  . insulin aspart  0-15 Units Subcutaneous TID WC  . ipratropium-albuterol  3 mL Nebulization Q4H  . magnesium oxide  400 mg Oral BID  . mouth rinse  15 mL Mouth Rinse BID  . metolazone  2.5 mg Oral Once per day on Mon Wed Fri  . pantoprazole  40 mg Oral QAC breakfast  . predniSONE  40 mg Oral Q breakfast  . primidone  50 mg Oral Daily  . sertraline  50 mg Oral Daily  . simvastatin  40 mg Oral QHS  . warfarin  8 mg Oral Once  . Warfarin - Pharmacist Dosing Inpatient   Does not apply q1800    PRN Meds: acetaminophen **OR** acetaminophen, albuterol, nitroGLYCERIN   Vital Signs    Vitals:   11/27/18 2307 11/28/18 0308 11/28/18 0617 11/28/18 0622  BP:   (!) 143/79   Pulse:   80   Resp:   (!) 24   Temp:   98.2 F (36.8 C)   TempSrc:   Oral   SpO2: 92% 94% 98% 99%  Weight:    86 kg  Height:        Intake/Output Summary (Last 24 hours) at 11/28/2018 0918 Last data filed at 11/28/2018 0600 Gross per 24 hour  Intake 840 ml  Output 2900 ml  Net -2060 ml   Filed Weights   11/26/18 2132 11/27/18 0500 11/28/18 0622  Weight: 87.1 kg 85.8 kg 86 kg    Telemetry    Ventricular pacing.  Personally reviewed.  ECG    Tracing from 11/26/2018 showed ventricular pacing with atrial sensing. Personally reviewed.  Physical Exam   GEN:  Chronically ill-appearing, no acute distress.   Neck:  Elevated JVP. Cardiac: RRR with ectopy, no gallop.  Respiratory:  Decreased breath sounds at the bases. GI: Soft, nontender, bowel sounds present. MS:  Chronic appearing leg edema; No deformity. Neuro:  Nonfocal. Psych: Alert  and oriented x 3. Normal affect.  Labs    Chemistry Recent Labs  Lab 11/26/18 1645 11/27/18 0437 11/28/18 0434  NA 132* 135 136  K 4.2 4.1 4.2  CL 92* 94* 95*  CO2 31 30 32  GLUCOSE 321* 178* 168*  BUN 31* 27* 34*  CREATININE 1.66* 1.49* 1.64*  CALCIUM 8.3* 8.5* 8.6*  PROT 6.2*  --   --   ALBUMIN 3.0*  --   --   AST 26  --   --   ALT 24  --   --   ALKPHOS 202*  --   --   BILITOT 0.6  --   --   GFRNONAA 44* 50* 44*  GFRAA 50* 57* 51*  ANIONGAP 9 11 9      Hematology Recent Labs  Lab 11/26/18 1645 11/27/18 0437  WBC 5.3 6.5  RBC 3.22* 3.16*  HGB 9.5* 9.2*  HCT 31.6* 30.9*  MCV 98.1 97.8  MCH 29.5 29.1  MCHC 30.1 29.8*  RDW 14.6 14.6  PLT 182 188    Cardiac Enzymes Recent Labs  Lab 11/26/18 1645 11/26/18 2249 11/27/18 0437  TROPONINI 0.06* 0.06* 0.07*   No results for  input(s): TROPIPOC in the last 168 hours.   BNP Recent Labs  Lab 11/26/18 1645  BNP 1,629.0*     Radiology    Dg Chest 2 View  Result Date: 11/26/2018 CLINICAL DATA:  Shortness of breath.  Lower extremity edema. EXAM: CHEST - 2 VIEW COMPARISON:  PA and lateral chest 10/16/2018 and 05/25/2017. FINDINGS: There is cardiomegaly and interstitial edema. Small right pleural effusion has increased since the most recent examination. Pacing device is in place. No pneumothorax. No acute or focal bony abnormality. IMPRESSION: Cardiomegaly and increased interstitial pulmonary edema. Small right pleural effusion has also increased since the most recent study. Electronically Signed   By: Inge Rise M.D.   On: 11/26/2018 17:12    Cardiac Studies   Echocardiogram 09/07/2018: Study Conclusions  - Left ventricle: The cavity size was severely dilated. Systolic   function was severely reduced. The estimated ejection fraction   was in the range of 20% to 25%. Diffuse hypokinesis. Features are   consistent with a pseudonormal left ventricular filling pattern,   with concomitant abnormal relaxation  and increased filling   pressure (grade 2 diastolic dysfunction). Doppler parameters are   consistent with elevated ventricular end-diastolic filling   pressure. - Ventricular septum: Septal motion showed abnormal function,   dyssynergy, and paradox. - Aortic valve: There was mild stenosis. There was trivial   regurgitation. Valve area (VTI): 2.04 cm^2. Valve area (Vmax):   2.09 cm^2. Valve area (Vmean): 1.98 cm^2. - Mitral valve: There was moderate regurgitation. - Left atrium: The atrium was severely dilated. - Right ventricle: The cavity size was moderately dilated. Wall   thickness was normal. Systolic function was mildly reduced. - Right atrium: The atrium was severely dilated. - Tricuspid valve: There was moderate regurgitation. - Pulmonic valve: There was moderate regurgitation. - Pulmonary arteries: Systolic pressure was moderately increased.   PA peak pressure: 50 mm Hg (S). - Inferior vena cava: The vessel was dilated. The respirophasic   diameter changes were blunted (< 50%), consistent with elevated   central venous pressure. - Pericardium, extracardiac: There was no pericardial effusion.  Patient Profile     63 y.o. male with a history of mixed cardiomyopathy with LVEF 20-25%, biventricular ICD in place, CAD status post DES to the LAD in 2006 with anterior infarct, paroxysmal atrial fibrillation on Coumadin, hypertension, hyperlipidemia, tobacco abuse, and type 2 diabetes mellitus with neuropathy now presenting with acute on chronic combined heart failure and volume overload.  Assessment & Plan    1.  Acute on chronic combined heart failure.  Patient is diuresing, net out greater than in by about 2000 cc last 24 hours.  Still with significant orthopnea and evidence of fluid overload.  Creatinine has been relatively stable in 1.4-1.6 range.  Concerned about low output failure.  Recent LFTs normal however, appetite limited.  2.  Mixed cardiomyopathy with LVEF 20 to 25% by  echocardiogram in December 2019, diffuse LV hypokinesis and moderate diastolic dysfunction with increased filling pressures.  He has mild right ventricular dysfunction as well and moderate pulmonary hypertension.  Currently only on diuretic therapy, Coreg was discontinued.  3.  CAD with previous anterior wall infarct in 2006 managed with DES to the LAD.  No active angina symptoms at this time.  Troponin I trend is not consistent with ACS.  4.  CKD stage III.  5.  Paroxysmal atrial fibrillation on Coumadin, INR 2.9.  6.  [employer] biventricular ICD in place, followed by Dr. Lovena Le.  Patient has history of complete heart block as well.  Plan to ask for a PICC line if patient can tolerate (can hold Coumadin if needed).  In this case we will obtain a co-ox and determine whether he might benefit from inotropic therapy.  In the meanwhile Lasix has been increased to 80 mg twice daily by Dr. Luan Pulling.  I would hold off on Zaroxolyn for now.  Continue to hold Coreg.  Also plan to start low-dose hydralazine for afterload reduction.  Signed, Rozann Lesches, MD  11/28/2018, 9:18 AM

## 2018-11-28 NOTE — Progress Notes (Signed)
Upon entering room to give breathing treatment patient had oxygen off and sats were 63%. Patient complaing of shortness of breath. RT informed patient he must keep nasal cannula on at all times to relieve SOB and maintain appropriate sats. Duoneb given and patient states he feels better. RT placed patient back on 4.5LNC and sats are 93%. Patient resting comfortably at this time.

## 2018-11-28 NOTE — Care Management Note (Signed)
Case Management Note  Patient Details  Name: DELTA DESHMUKH MRN: [id number] Date of Birth: 1956/03/17  Subjective/Objective:     Admitted with CHF. Pt from home, ind with ADL's. Pt has insurance and PCP. Admission last month for same. Refused Home health at that time. Discussed home health RN with patient for disease management, he is agreeable. Discussed [place] program [facility], however patient will not be eligible as he has a pacemaker. Will still order [facility] RN at time of DC.  Patient reports he has a home oxygen concentrator at home, unsure of agency, reports he does not utilize it often.           Has PCP. Will have cardiology follow up. Has transportation.  Action/Plan: DC home with home health RN. Will refer to [facility].  Expected Discharge Date:   unk               Expected Discharge Plan:  [place]  In-House Referral:     Discharge planning Services  CM Consult  Post Acute Care Choice:  Home Health Choice offered to:  Patient  DME Arranged:    DME Agency:     HH Arranged:  RN, Disease Management [place] Agency:  [facility] (now [facility])  Status of Service:  Completed, signed off  If discussed at [place] of Stay Meetings, dates discussed:    Additional Comments:  Filicia Scogin, Chauncey Reading, RN 11/28/2018, 9:24 AM

## 2018-11-28 NOTE — Telephone Encounter (Signed)
I will check on him when I am there on Friday. GT

## 2018-11-28 NOTE — Progress Notes (Signed)
   Progress Note  Reviewed follow-up lab work, co-oximetry 61.8 so we will hold off on considering inotropic therapy at this time.  Continue IV Lasix, hydralazine was added for afterload reduction.  Might consider switching to Lasix drip depending on urine output and renal function tomorrow.  Signed, Rozann Lesches, MD  11/28/2018, 4:50 PM

## 2018-11-28 NOTE — Progress Notes (Signed)
ANTICOAGULATION CONSULT NOTE - Follow Up Consult  Pharmacy Consult for warfarin Indication: atrial fibrillation  Allergies  Allergen Reactions  . Vancomycin Itching and Rash    Patient Measurements: Height: 6' (182.9 cm) Weight: 189 lb 11.2 oz (86 kg) IBW/kg (Calculated) : 77.6  Vital Signs: Temp: 98.2 F (36.8 C) (02/25 0617) Temp Source: Oral (02/25 0617) BP: 143/79 (02/25 0617) Pulse Rate: 80 (02/25 0617)  Labs: Recent Labs    11/26/18 1645 11/26/18 2249 11/27/18 0437 11/28/18 0434  HGB 9.5*  --  9.2*  --   HCT 31.6*  --  30.9*  --   PLT 182  --  188  --   LABPROT 26.6*  --  28.0* 30.0*  INR 2.49  --  2.66 2.9*  CREATININE 1.66*  --  1.49* 1.64*  TROPONINI 0.06* 0.06* 0.07*  --     Estimated Creatinine Clearance: 51.3 mL/min (A) (by C-G formula based on SCr of 1.64 mg/dL (H)).   Medications:  Medications Prior to Admission  Medication Sig Dispense Refill Last Dose  . acetaminophen (TYLENOL) 500 MG tablet Take 500 mg by mouth every 6 (six) hours as needed for mild pain or moderate pain.    11/25/2018  . albuterol (PROVENTIL) (2.5 MG/3ML) 0.083% nebulizer solution Take 2.5 mg by nebulization every 6 (six) hours as needed. For shortness of breath   11/25/2018  . ferrous sulfate 325 (65 FE) MG EC tablet Take 325 mg by mouth 2 (two) times daily.   11/25/2018  . insulin glargine (LANTUS) 100 UNIT/ML injection Inject 20 Units into the skin daily as needed (for high blood sugar).    11/25/2018  . Lactobacillus (ACIDOPHOLUS PO) Take 1 tablet by mouth daily.   11/25/2018  . magnesium oxide (MAG-OX) 400 MG tablet Take 400 mg by mouth 2 (two) times daily.   11/25/2018  . metolazone (ZAROXOLYN) 2.5 MG tablet Take 2.5 mg (1 tablet) on Monday, Wednesday & Friday 30 minutes prior to taking torsemide. 30 tablet 3 11/25/2018  . Multiple Vitamin (MULTIVITAMIN) tablet Take 1 tablet by mouth daily.   11/25/2018  . pantoprazole (PROTONIX) 40 MG tablet Take 1 tablet (40 mg total) by mouth  daily before breakfast. 30 tablet 12 11/25/2018  . primidone (MYSOLINE) 50 MG tablet Take 50 mg by mouth daily.    11/25/2018  . sertraline (ZOLOFT) 50 MG tablet Take 50 mg by mouth daily.   11/25/2018  . simvastatin (ZOCOR) 40 MG tablet Take 40 mg by mouth at bedtime.    11/25/2018  . torsemide (DEMADEX) 20 MG tablet Take 2 tablets (40 mg total) by mouth 2 (two) times daily. 360 tablet 3 11/25/2018  . warfarin (COUMADIN) 4 MG tablet Take 3 tablets daily or as directed (Patient taking differently: Take 12 mg by mouth every evening. ) 260 tablet 3 11/25/2018 at 2000  . albuterol (PROVENTIL HFA;VENTOLIN HFA) 108 (90 BASE) MCG/ACT inhaler Inhale 2 puffs into the lungs every 6 (six) hours as needed. For shortness of breath   Taking  . nitroGLYCERIN (NITROSTAT) 0.4 MG SL tablet Place 1 tablet (0.4 mg total) under the tongue every 5 (five) minutes x 3 doses as needed. For chest pain 25 tablet 3  at Unknown time  . ondansetron (ZOFRAN) 4 MG tablet Take 4 mg by mouth 4 (four) times daily as needed for nausea or vomiting.   Taking   Scheduled:  . ferrous sulfate  325 mg Oral BID  . furosemide  40 mg Intravenous BID  .  insulin aspart  0-15 Units Subcutaneous TID WC  . ipratropium-albuterol  3 mL Nebulization Q4H  . magnesium oxide  400 mg Oral BID  . mouth rinse  15 mL Mouth Rinse BID  . metolazone  2.5 mg Oral Once per day on Mon Wed Fri  . pantoprazole  40 mg Oral QAC breakfast  . primidone  50 mg Oral Daily  . sertraline  50 mg Oral Daily  . simvastatin  40 mg Oral QHS  . Warfarin - Pharmacist Dosing Inpatient   Does not apply q1800   Infusions:   PRN: acetaminophen **OR** acetaminophen, albuterol, nitroGLYCERIN Anti-infectives (From admission, onward)   None      Assessment: 63 year old male on warfarin PTA for atrial fibrillation, INR therapeutic at 2.9 which has trended up. Will give lower dose with increase in INR  Home dose of 8 mg on Wed, 12 mg ROW.  Goal of Therapy:  INR 2-3 Monitor  platelets by anticoagulation protocol: Yes   Plan:  Coumadin 8mg  x 1 today Daily PT-INR Monitor for signs and symptoms of bleeding.   [name], BS [name], BCPS Clinical Pharmacist Pager [phone] 11/28/2018,7:45 AM

## 2018-11-28 NOTE — Progress Notes (Signed)
Subjective: He was admitted with heart failure.  He says he does not feel well.  He had to sit up all night last night.  No chest pain.  Objective: Vital signs in last 24 hours: Temp:  [98.2 F (36.8 C)-98.6 F (37 C)] 98.2 F (36.8 C) (02/25 0617) Pulse Rate:  [80-93] 80 (02/25 0617) Resp:  [18-24] 24 (02/25 0617) BP: (118-143)/(67-79) 143/79 (02/25 0617) SpO2:  [81 %-99 %] 99 % (02/25 0622) Weight:  [86 kg] 86 kg (02/25 0622) Weight change: 4.4 kg Last BM Date: 11/26/18  Intake/Output from previous day: 02/24 0701 - 02/25 0700 In: 840 [P.O.:840] Out: 2900 [Urine:2900]  PHYSICAL EXAM General appearance: alert, cooperative and mild distress Resp: rales bilaterally Cardio: regular rate and rhythm, S1, S2 normal, no murmur, click, rub or gallop GI: soft, non-tender; bowel sounds normal; no masses,  no organomegaly Extremities: He still has edema of both legs and he has chronic venous stasis changes right more than left  Lab Results:  Results for orders placed or performed during the hospital encounter of 11/26/18 (from the past 48 hour(s))  Brain natriuretic peptide     Status: Abnormal   Collection Time: 11/26/18  4:45 PM  Result Value Ref Range   B Natriuretic Peptide 1,629.0 (H) 0.0 - 100.0 pg/mL    Comment: Performed at [facility], [address], [place], [place] [address]  CBC with Differential     Status: Abnormal   Collection Time: 11/26/18  4:45 PM  Result Value Ref Range   WBC 5.3 4.0 - 10.5 K/uL   RBC 3.22 (L) 4.22 - 5.81 MIL/uL   Hemoglobin 9.5 (L) 13.0 - 17.0 g/dL   HCT 31.6 (L) 39.0 - 52.0 %   MCV 98.1 80.0 - 100.0 fL   MCH 29.5 26.0 - 34.0 pg   MCHC 30.1 30.0 - 36.0 g/dL   RDW 14.6 11.5 - 15.5 %   Platelets 182 150 - 400 K/uL   nRBC 0.0 0.0 - 0.2 %   Neutrophils Relative % 72 %   Neutro Abs 3.8 1.7 - 7.7 K/uL   Lymphocytes Relative 9 %   Lymphs Abs 0.5 (L) 0.7 - 4.0 K/uL   Monocytes Relative 7 %   Monocytes Absolute 0.4 0.1 - 1.0 K/uL    Eosinophils Relative 12 %   Eosinophils Absolute 0.7 (H) 0.0 - 0.5 K/uL   Basophils Relative 0 %   Basophils Absolute 0.0 0.0 - 0.1 K/uL   Immature Granulocytes 0 %   Abs Immature Granulocytes 0.01 0.00 - 0.07 K/uL    Comment: Performed at [facility], [address], [place], [place] [address]  Comprehensive metabolic panel     Status: Abnormal   Collection Time: 11/26/18  4:45 PM  Result Value Ref Range   Sodium 132 (L) 135 - 145 mmol/L   Potassium 4.2 3.5 - 5.1 mmol/L   Chloride 92 (L) 98 - 111 mmol/L   CO2 31 22 - 32 mmol/L   Glucose, Bld 321 (H) 70 - 99 mg/dL   BUN 31 (H) 8 - 23 mg/dL   Creatinine, Ser 1.66 (H) 0.61 - 1.24 mg/dL   Calcium 8.3 (L) 8.9 - 10.3 mg/dL   Total Protein 6.2 (L) 6.5 - 8.1 g/dL   Albumin 3.0 (L) 3.5 - 5.0 g/dL   AST 26 15 - 41 U/L   ALT 24 0 - 44 U/L   Alkaline Phosphatase 202 (H) 38 - 126 U/L   Total Bilirubin 0.6 0.3 -  1.2 mg/dL   GFR calc non Af Amer 44 (L) >60 mL/min   GFR calc Af Amer 50 (L) >60 mL/min   Anion gap 9 5 - 15    Comment: Performed at [facility], [address], [place], [place] [address]  Troponin I - Once     Status: Abnormal   Collection Time: 11/26/18  4:45 PM  Result Value Ref Range   Troponin I 0.06 (HH) <0.03 ng/mL    Comment: CRITICAL RESULT CALLED TO, READ BACK BY AND VERIFIED WITH: CREWS.M. AT 1497 ON 11/26/2018 Performed at [facility], [address], [place], [place] [address]   Protime-INR     Status: Abnormal   Collection Time: 11/26/18  4:45 PM  Result Value Ref Range   Prothrombin Time 26.6 (H) 11.4 - 15.2 seconds   INR 2.49     Comment: Performed at [facility], [address], [place], [place] [address]  Magnesium     Status: Abnormal   Collection Time: 11/26/18  4:45 PM  Result Value Ref Range   Magnesium 2.5 (H) 1.7 - 2.4 mg/dL    Comment: Performed at [facility], [address], [place], [place] [address]  Phosphorus     Status: Abnormal   Collection Time: 11/26/18  4:45 PM  Result Value Ref Range    Phosphorus 4.9 (H) 2.5 - 4.6 mg/dL    Comment: Performed at [facility], [address], [place], [place] [address]  Glucose, capillary     Status: Abnormal   Collection Time: 11/26/18  9:33 PM  Result Value Ref Range   Glucose-Capillary 256 (H) 70 - 99 mg/dL   Comment 1 Notify RN    Comment 2 Document in Chart   Troponin I - Now Then Q6H     Status: Abnormal   Collection Time: 11/26/18 10:49 PM  Result Value Ref Range   Troponin I 0.06 (HH) <0.03 ng/mL    Comment: CRITICAL VALUE NOTED.  VALUE IS CONSISTENT WITH PREVIOUSLY REPORTED AND CALLED VALUE. Performed at [facility], [address] [place], [place] [address]   Troponin I - Now Then Q6H     Status: Abnormal   Collection Time: 11/27/18  4:37 AM  Result Value Ref Range   Troponin I 0.07 (HH) <0.03 ng/mL    Comment: CRITICAL RESULT CALLED TO, READ BACK BY AND VERIFIED WITH: BONDURANT,R AT 6:55AM ON 11/27/18 BY [facility] Performed at [facility], [address], [place], [place] [address]   Basic metabolic panel     Status: Abnormal   Collection Time: 11/27/18  4:37 AM  Result Value Ref Range   Sodium 135 135 - 145 mmol/L   Potassium 4.1 3.5 - 5.1 mmol/L   Chloride 94 (L) 98 - 111 mmol/L   CO2 30 22 - 32 mmol/L   Glucose, Bld 178 (H) 70 - 99 mg/dL   BUN 27 (H) 8 - 23 mg/dL   Creatinine, Ser 1.49 (H) 0.61 - 1.24 mg/dL   Calcium 8.5 (L) 8.9 - 10.3 mg/dL   GFR calc non Af Amer 50 (L) >60 mL/min   GFR calc Af Amer 57 (L) >60 mL/min   Anion gap 11 5 - 15    Comment: Performed at [facility], [address] [place], [place] [address]  CBC     Status: Abnormal   Collection Time: 11/27/18  4:37 AM  Result Value Ref Range   WBC 6.5 4.0 - 10.5 K/uL   RBC 3.16 (L) 4.22 -  5.81 MIL/uL   Hemoglobin 9.2 (L) 13.0 - 17.0 g/dL   HCT 30.9 (L) 39.0 - 52.0 %   MCV 97.8 80.0 - 100.0 fL   MCH 29.1 26.0 - 34.0 pg   MCHC 29.8 (L) 30.0 - 36.0 g/dL   RDW 14.6 11.5 - 15.5 %   Platelets 188 150 - 400 K/uL   nRBC 0.0 0.0 - 0.2 %     Comment: Performed at [facility], [address] [place], [place] [address]  Protime-INR     Status: Abnormal   Collection Time: 11/27/18  4:37 AM  Result Value Ref Range   Prothrombin Time 28.0 (H) 11.4 - 15.2 seconds   INR 2.66     Comment: Performed at [facility], [address], [place], [place] [address]  Glucose, capillary     Status: Abnormal   Collection Time: 11/27/18  7:13 AM  Result Value Ref Range   Glucose-Capillary 161 (H) 70 - 99 mg/dL   Comment 1 Notify RN    Comment 2 Document in Chart   Glucose, capillary     Status: Abnormal   Collection Time: 11/27/18 11:19 AM  Result Value Ref Range   Glucose-Capillary 183 (H) 70 - 99 mg/dL   Comment 1 Notify RN    Comment 2 Document in Chart   Glucose, capillary     Status: Abnormal   Collection Time: 11/27/18  4:26 PM  Result Value Ref Range   Glucose-Capillary 212 (H) 70 - 99 mg/dL   Comment 1 Notify RN    Comment 2 Document in Chart   Glucose, capillary     Status: Abnormal   Collection Time: 11/27/18 10:25 PM  Result Value Ref Range   Glucose-Capillary 191 (H) 70 - 99 mg/dL  Basic metabolic panel     Status: Abnormal   Collection Time: 11/28/18  4:34 AM  Result Value Ref Range   Sodium 136 135 - 145 mmol/L   Potassium 4.2 3.5 - 5.1 mmol/L   Chloride 95 (L) 98 - 111 mmol/L   CO2 32 22 - 32 mmol/L   Glucose, Bld 168 (H) 70 - 99 mg/dL   BUN 34 (H) 8 - 23 mg/dL   Creatinine, Ser 1.64 (H) 0.61 - 1.24 mg/dL   Calcium 8.6 (L) 8.9 - 10.3 mg/dL   GFR calc non Af Amer 44 (L) >60 mL/min   GFR calc Af Amer 51 (L) >60 mL/min   Anion gap 9 5 - 15    Comment: Performed at [facility], [address] [place],  [address]  Protime-INR     Status: Abnormal   Collection Time: 11/28/18  4:34 AM  Result Value Ref Range   Prothrombin Time 30.0 (H) 11.4 - 15.2 seconds   INR 2.9 (H) 0.8 - 1.2    Comment: Performed at [facility], [address], [place], [place] [address]  Glucose, capillary     Status: Abnormal    Collection Time: 11/28/18  7:32 AM  Result Value Ref Range   Glucose-Capillary 184 (H) 70 - 99 mg/dL    ABGS No results for input(s): PHART, PO2ART, TCO2, HCO3 in the last 72 hours.  Invalid input(s): PCO2 CULTURES No results found for this or any previous visit (from the past 240 hour(s)). Studies/Results: Dg Chest 2 View  Result Date: 11/26/2018 CLINICAL DATA:  Shortness of breath.  Lower extremity edema. EXAM: CHEST - 2 VIEW COMPARISON:  PA and lateral chest 10/16/2018 and 05/25/2017. FINDINGS: There is cardiomegaly  and interstitial edema. Small right pleural effusion has increased since the most recent examination. Pacing device is in place. No pneumothorax. No acute or focal bony abnormality. IMPRESSION: Cardiomegaly and increased interstitial pulmonary edema. Small right pleural effusion has also increased since the most recent study. Electronically Signed   By: Inge Rise M.D.   On: 11/26/2018 17:12    Medications:  Prior to Admission:  Medications Prior to Admission  Medication Sig Dispense Refill Last Dose  . acetaminophen (TYLENOL) 500 MG tablet Take 500 mg by mouth every 6 (six) hours as needed for mild pain or moderate pain.    11/25/2018  . albuterol (PROVENTIL) (2.5 MG/3ML) 0.083% nebulizer solution Take 2.5 mg by nebulization every 6 (six) hours as needed. For shortness of breath   11/25/2018  . ferrous sulfate 325 (65 FE) MG EC tablet Take 325 mg by mouth 2 (two) times daily.   11/25/2018  . insulin glargine (LANTUS) 100 UNIT/ML injection Inject 20 Units into the skin daily as needed (for high blood sugar).    11/25/2018  . Lactobacillus (ACIDOPHOLUS PO) Take 1 tablet by mouth daily.   11/25/2018  . magnesium oxide (MAG-OX) 400 MG tablet Take 400 mg by mouth 2 (two) times daily.   11/25/2018  . metolazone (ZAROXOLYN) 2.5 MG tablet Take 2.5 mg (1 tablet) on Monday, Wednesday & Friday 30 minutes prior to taking torsemide. 30 tablet 3 11/25/2018  . Multiple Vitamin  (MULTIVITAMIN) tablet Take 1 tablet by mouth daily.   11/25/2018  . pantoprazole (PROTONIX) 40 MG tablet Take 1 tablet (40 mg total) by mouth daily before breakfast. 30 tablet 12 11/25/2018  . primidone (MYSOLINE) 50 MG tablet Take 50 mg by mouth daily.    11/25/2018  . sertraline (ZOLOFT) 50 MG tablet Take 50 mg by mouth daily.   11/25/2018  . simvastatin (ZOCOR) 40 MG tablet Take 40 mg by mouth at bedtime.    11/25/2018  . torsemide (DEMADEX) 20 MG tablet Take 2 tablets (40 mg total) by mouth 2 (two) times daily. 360 tablet 3 11/25/2018  . warfarin (COUMADIN) 4 MG tablet Take 3 tablets daily or as directed (Patient taking differently: Take 12 mg by mouth every evening. ) 260 tablet 3 11/25/2018 at 2000  . albuterol (PROVENTIL HFA;VENTOLIN HFA) 108 (90 BASE) MCG/ACT inhaler Inhale 2 puffs into the lungs every 6 (six) hours as needed. For shortness of breath   Taking  . nitroGLYCERIN (NITROSTAT) 0.4 MG SL tablet Place 1 tablet (0.4 mg total) under the tongue every 5 (five) minutes x 3 doses as needed. For chest pain 25 tablet 3  at Unknown time  . ondansetron (ZOFRAN) 4 MG tablet Take 4 mg by mouth 4 (four) times daily as needed for nausea or vomiting.   Taking   Scheduled: . ferrous sulfate  325 mg Oral BID  . furosemide  80 mg Intravenous BID  . insulin aspart  0-15 Units Subcutaneous TID WC  . ipratropium-albuterol  3 mL Nebulization Q4H  . magnesium oxide  400 mg Oral BID  . mouth rinse  15 mL Mouth Rinse BID  . metolazone  2.5 mg Oral Once per day on Mon Wed Fri  . pantoprazole  40 mg Oral QAC breakfast  . predniSONE  40 mg Oral Q breakfast  . primidone  50 mg Oral Daily  . sertraline  50 mg Oral Daily  . simvastatin  40 mg Oral QHS  . warfarin  8 mg Oral Once  . Warfarin -  Pharmacist Dosing Inpatient   Does not apply q1800   Continuous:  [id number] **OR** acetaminophen, albuterol, nitroGLYCERIN  Assesment: He was admitted with acute on chronic combined systolic and diastolic  heart failure.  He has been seen by Dr. Domenic Polite with cardiology and treatment is underway.  He says he was not able to lie flat to sleep last night so I am going to increase his Lasix.  He has COPD at baseline I am going to add prednisone to see if part of his problem is COPD exacerbation  He has hypertension which is pretty well controlled  He has coronary disease and had elevated troponin but does not have acute coronary syndrome  He has diabetes which is stable  He has had trouble with atrial fib and is chronically anticoagulated. Principal Problem:   Acute on chronic combined systolic and diastolic congestive heart failure (HCC) Active Problems:   COPD (chronic obstructive pulmonary disease) (HCC)   Arteriosclerotic cardiovascular disease (ASCVD)   Diabetes mellitus, type II (HCC)   Hypertension   Hyperlipidemia   Tobacco abuse   Atrial fibrillation (HCC)   IDA (iron deficiency anemia)   Barrett's esophagus   Acute on chronic combined systolic (congestive) and diastolic (congestive) heart failure (HCC)   Elevated troponin    Plan: Continue treatments increase Lasix.  Add prednisone      LOS: 1 day   Alonza Bogus 11/28/2018, 9:03 AM

## 2018-11-29 DIAGNOSIS — N179 Acute kidney failure, unspecified: Secondary | ICD-10-CM

## 2018-11-29 DIAGNOSIS — E119 Type 2 diabetes mellitus without complications: Secondary | ICD-10-CM

## 2018-11-29 DIAGNOSIS — I2510 Atherosclerotic heart disease of native coronary artery without angina pectoris: Secondary | ICD-10-CM

## 2018-11-29 LAB — BASIC METABOLIC PANEL
Anion gap: 11 (ref 5–15)
BUN: 43 mg/dL — ABNORMAL HIGH (ref 8–23)
CO2: 31 mmol/L (ref 22–32)
Calcium: 8.6 mg/dL — ABNORMAL LOW (ref 8.9–10.3)
Chloride: 92 mmol/L — ABNORMAL LOW (ref 98–111)
Creatinine, Ser: 1.7 mg/dL — ABNORMAL HIGH (ref 0.61–1.24)
GFR calc non Af Amer: 42 mL/min — ABNORMAL LOW (ref >60)
GFR, EST AFRICAN AMERICAN: 49 mL/min — AB (ref >60)
Glucose, Bld: 333 mg/dL — ABNORMAL HIGH (ref 70–99)
Potassium: 4.4 mmol/L (ref 3.5–5.1)
Sodium: 134 mmol/L — ABNORMAL LOW (ref 135–145)

## 2018-11-29 LAB — MRSA PCR SCREENING: MRSA by PCR: NEGATIVE

## 2018-11-29 LAB — GLUCOSE, CAPILLARY
Glucose-Capillary: 324 mg/dL — ABNORMAL HIGH (ref 70–99)
Glucose-Capillary: 310 mg/dL — ABNORMAL HIGH (ref 70–99)
Glucose-Capillary: 137 mg/dL — ABNORMAL HIGH (ref 70–99)
Glucose-Capillary: 94 mg/dL (ref 70–99)

## 2018-11-29 LAB — PROTIME-INR
INR: 4.1 (ref 0.8–1.2)
Prothrombin Time: 39.3 seconds — ABNORMAL HIGH (ref 11.4–15.2)

## 2018-11-29 LAB — COOXEMETRY PANEL
Carboxyhemoglobin: 1.9 % — ABNORMAL HIGH (ref 0.5–1.5)
Carboxyhemoglobin: 2.2 % — ABNORMAL HIGH (ref 0.5–1.5)
Methemoglobin: 0.7 % (ref 0.0–1.5)
Methemoglobin: 1.5 % (ref 0.0–1.5)
O2 Saturation: 49.4 %
O2 Saturation: 78.5 %
Total hemoglobin: 9.2 g/dL — ABNORMAL LOW (ref 12.0–16.0)
Total hemoglobin: 13.1 g/dL (ref 12.0–16.0)

## 2018-11-29 MED ORDER — FUROSEMIDE 10 MG/ML IJ SOLN
80.0000 mg | Freq: Two times a day (BID) | INTRAMUSCULAR | Status: DC
  Administered 2018-11-29: 80 mg via INTRAVENOUS

## 2018-11-29 MED ORDER — FUROSEMIDE 10 MG/ML IJ SOLN
INTRAMUSCULAR | Status: AC
  Filled 2018-11-29: qty 8

## 2018-11-29 MED ORDER — MILRINONE LACTATE IN DEXTROSE 20-5 MG/100ML-% IV SOLN
0.1250 ug/kg/min | INTRAVENOUS | Status: DC
  Administered 2018-11-29 – 2018-12-03 (×7): 0.25 ug/kg/min via INTRAVENOUS
  Filled 2018-11-29 (×7): qty 100

## 2018-11-29 MED ORDER — HYDRALAZINE HCL 50 MG PO TABS
25.0000 mg | ORAL_TABLET | Freq: Three times a day (TID) | ORAL | Status: DC
  Administered 2018-11-29 – 2018-11-30 (×3): 25 mg via ORAL
  Filled 2018-11-29 (×3): qty 1

## 2018-11-29 MED ORDER — INSULIN GLARGINE 100 UNIT/ML ~~LOC~~ SOLN
12.0000 [IU] | Freq: Every day | SUBCUTANEOUS | Status: DC
  Administered 2018-11-29 – 2018-12-03 (×5): 12 [IU] via SUBCUTANEOUS
  Filled 2018-11-29 (×7): qty 0.12

## 2018-11-29 MED ORDER — INSULIN ASPART 100 UNIT/ML ~~LOC~~ SOLN
0.0000 [IU] | Freq: Every day | SUBCUTANEOUS | Status: DC
  Administered 2018-11-29: 4 [IU] via SUBCUTANEOUS
  Administered 2018-11-30: 2 [IU] via SUBCUTANEOUS
  Administered 2018-12-01 – 2018-12-04 (×3): 4 [IU] via SUBCUTANEOUS
  Administered 2018-12-05: 5 [IU] via SUBCUTANEOUS
  Administered 2018-12-06: 3 [IU] via SUBCUTANEOUS

## 2018-11-29 MED ORDER — ISOSORBIDE MONONITRATE ER 30 MG PO TB24
30.0000 mg | ORAL_TABLET | Freq: Every day | ORAL | Status: DC
  Administered 2018-11-29 – 2018-11-30 (×2): 30 mg via ORAL
  Filled 2018-11-29 (×2): qty 1

## 2018-11-29 MED ORDER — INSULIN ASPART 100 UNIT/ML ~~LOC~~ SOLN
0.0000 [IU] | Freq: Three times a day (TID) | SUBCUTANEOUS | Status: DC
  Administered 2018-11-29: 3 [IU] via SUBCUTANEOUS
  Administered 2018-11-29: 15 [IU] via SUBCUTANEOUS
  Administered 2018-11-30: 4 [IU] via SUBCUTANEOUS
  Administered 2018-11-30: 7 [IU] via SUBCUTANEOUS
  Administered 2018-11-30 – 2018-12-01 (×2): 11 [IU] via SUBCUTANEOUS
  Administered 2018-12-01: 7 [IU] via SUBCUTANEOUS
  Administered 2018-12-02: 3 [IU] via SUBCUTANEOUS
  Administered 2018-12-02: 11 [IU] via SUBCUTANEOUS
  Administered 2018-12-02: 15 [IU] via SUBCUTANEOUS
  Administered 2018-12-03: 11 [IU] via SUBCUTANEOUS
  Administered 2018-12-03 – 2018-12-04 (×3): 7 [IU] via SUBCUTANEOUS
  Administered 2018-12-04: 15 [IU] via SUBCUTANEOUS
  Administered 2018-12-05: 4 [IU] via SUBCUTANEOUS
  Administered 2018-12-05: 3 [IU] via SUBCUTANEOUS
  Administered 2018-12-05: 20 [IU] via SUBCUTANEOUS
  Administered 2018-12-06: 7 [IU] via SUBCUTANEOUS
  Administered 2018-12-06: 4 [IU] via SUBCUTANEOUS
  Administered 2018-12-06: 11 [IU] via SUBCUTANEOUS
  Administered 2018-12-07: 4 [IU] via SUBCUTANEOUS
  Administered 2018-12-07: 3 [IU] via SUBCUTANEOUS
  Administered 2018-12-07 (×2): 15 [IU] via SUBCUTANEOUS
  Administered 2018-12-08: 4 [IU] via SUBCUTANEOUS
  Administered 2018-12-08: 3 [IU] via SUBCUTANEOUS
  Administered 2018-12-08: 15 [IU] via SUBCUTANEOUS

## 2018-11-29 MED ORDER — METOLAZONE 2.5 MG PO TABS
2.5000 mg | ORAL_TABLET | Freq: Once | ORAL | Status: AC
  Administered 2018-11-29: 2.5 mg via ORAL
  Filled 2018-11-29: qty 1

## 2018-11-29 MED ORDER — INSULIN ASPART 100 UNIT/ML ~~LOC~~ SOLN
4.0000 [IU] | Freq: Three times a day (TID) | SUBCUTANEOUS | Status: DC
  Administered 2018-11-29 – 2018-12-04 (×16): 4 [IU] via SUBCUTANEOUS

## 2018-11-29 MED ORDER — MILRINONE LACTATE IN DEXTROSE 20-5 MG/100ML-% IV SOLN: 0.2500 ug/kg/min | INTRAVENOUS | Status: DC

## 2018-11-29 MED ORDER — PREDNISONE 20 MG PO TABS
20.0000 mg | ORAL_TABLET | Freq: Every day | ORAL | Status: AC
  Administered 2018-11-30: 20 mg via ORAL
  Filled 2018-11-29: qty 1

## 2018-11-29 MED ORDER — POTASSIUM CHLORIDE CRYS ER 20 MEQ PO TBCR
40.0000 meq | EXTENDED_RELEASE_TABLET | Freq: Once | ORAL | Status: AC
  Administered 2018-11-29: 40 meq via ORAL
  Filled 2018-11-29: qty 2

## 2018-11-29 MED ORDER — FUROSEMIDE 10 MG/ML IJ SOLN
12.0000 mg/h | INTRAVENOUS | Status: DC
  Administered 2018-11-29 – 2018-11-30 (×2): 12 mg/h via INTRAVENOUS
  Filled 2018-11-29: qty 10
  Filled 2018-11-29 (×2): qty 25

## 2018-11-29 MED ORDER — HYDRALAZINE HCL 50 MG PO TABS
25.0000 mg | ORAL_TABLET | Freq: Three times a day (TID) | ORAL | Status: DC
  Administered 2018-11-29: 25 mg via ORAL
  Filled 2018-11-29: qty 1

## 2018-11-29 MED ORDER — SODIUM CHLORIDE 0.9% FLUSH: 10.0000 mL | INTRAVENOUS | Status: DC | PRN

## 2018-11-29 NOTE — Consult Note (Addendum)
Advanced Heart Failure Team Consult Note   Primary Physician: Sinda Du, MD PCP-Cardiologist:  Carlyle Dolly, MD  Reason for Consultation: Acute on chronic systolic CHF  HPI:    [name] is seen today for evaluation of acute on chronic systolic CHF at the request of Dr. Domenic Polite.   IVAAN LIDDY is a 63 y.o. male with h/o Chronic Afib, CAD, OA of both hands, chronic combined systolic/diastolic CHF, CHB s/p Boston Sci BiV ICD, Carpal Tunnel syndrome, h/o Colon CA, COPD, DM2, peripheral neuropathy, ETOH abuse, GERD, HLD, HTN, and h/o tobacco abuse.   Last seen as an outpatient by Dr. Lovena Le 11/21/18. They were working through worsening DOE, volume overload, and sodium restriction non-compliance. Pt referred to CHF clinic. Coreg stopped.   He presented to [facility] 11/26/18 with worsening SOB and edema despite adjustment of outpatient diuretics. Pertinent labs on admission include K 4.2, Cr 1.66, BUN 31, Mg 2.5, Troponin 0.06, BNP 1629. EKG with V paced as below. CXR showed cardiomegaly and increased interstitial edema + small R pleural effusion.   PICC placed with volume overload and difficult diuresis due to AKI. CR 1.49 -> 1.64 -> 1.7 with attempts to diurese. Initial coox 61%, but 49% am of 11/29/2018 consistent with low output CHF. Transferred to [facility] for further evaluation by the CHF team.   He is SOB at rest on arrival to stepdown unit. [facility] states this has been a change over the past 15-20 minutes. His sats dropped into to 80s on 4L via [place] and required transition to NRB. No lightheadedness or dizziness. He is profoundly orthopneic, and is unable to lie down in bed. Sitting on edge of bed and refuses to even try and lie down currently. He is not diaphoretic and denies CP at this time. He states he never really felt any better after leaving hospital in January.   Negative 5.7 L thus far, but Cr trending up. Weight shows up since admission, but ? Accuracy.   Echo 09/07/2018  LVEF 20-25%, Diffuse HK, Grade 2 DD< Mild AS, Trivial AI, Mod MR, Sever LAE, RV mildly reduced, Severe RAE< Mod TR, Mod PI, PA peak pressure 50 mmg Hg.   Echo 03/2017 LVEF 20-25% Echo 01/2014 LVEF 25-30%  L/RHC 11/2005 (Most recent) 1. Left main angiographically normal 2. LAD: Moderate-sized vessel giving rise to two diagonals. There was focal 90% in-stent restenosis in the distal portion of the previously placed stent as well as moderate in-stent restenosis throughout the stented segment. This was treated with a single drug-eluting stent with excellent angiographic result. 3. Circumflex: Large vessel giving rise to two obtuse marginals. It is angiographically normal. 4. RCA: Large dominant vessel. It is angiographically normal. Hemodynamics (mmHg) RA mean 10 RV 39/5 (11) PA 40/19 (28) PCWP 17 AO 98/62 (79) Cardiac Output (Fick) 3.8 Cardiac Index (Fick) 1.8 Cardiac Output (Thermo) 8.6 (Thought to be error) Cardiac Index (Thermo) 4.4 (Thought to be error)  Review of systems complete and found to be negative unless listed in HPI.    Home Medications Prior to Admission medications   Medication Sig Start Date End Date Taking? Authorizing Provider  acetaminophen (TYLENOL) 500 MG tablet Take 500 mg by mouth every 6 (six) hours as needed for mild pain or moderate pain.  01/08/14  Yes [provider]  albuterol (PROVENTIL) (2.5 MG/3ML) 0.083% nebulizer solution Take 2.5 mg by nebulization every 6 (six) hours as needed. For shortness of breath   Yes [provider]  ferrous  sulfate 325 (65 FE) MG EC tablet Take 325 mg by mouth 2 (two) times daily.   Yes [provider]  insulin glargine (LANTUS) 100 UNIT/ML injection Inject 20 Units into the skin daily as needed (for high blood sugar).    Yes [provider]  Lactobacillus (ACIDOPHOLUS PO) Take 1 tablet by mouth daily.   Yes [provider]  magnesium oxide (MAG-OX) 400 MG tablet Take 400 mg by mouth 2  (two) times daily.   Yes [provider]  metolazone (ZAROXOLYN) 2.5 MG tablet Take 2.5 mg (1 tablet) on Monday, Wednesday & Friday 30 minutes prior to taking torsemide. 11/15/18  Yes Evans Lance, MD  Multiple Vitamin (MULTIVITAMIN) tablet Take 1 tablet by mouth daily.   Yes [provider]  pantoprazole (PROTONIX) 40 MG tablet Take 1 tablet (40 mg total) by mouth daily before breakfast. 03/31/17  Yes Sinda Du, MD  primidone (MYSOLINE) 50 MG tablet Take 50 mg by mouth daily.    Yes [provider]  sertraline (ZOLOFT) 50 MG tablet Take 50 mg by mouth daily.   Yes [provider]  simvastatin (ZOCOR) 40 MG tablet Take 40 mg by mouth at bedtime.    Yes [provider]  torsemide (DEMADEX) 20 MG tablet Take 2 tablets (40 mg total) by mouth 2 (two) times daily. 11/21/18 02/21/2019 Yes Evans Lance, MD  warfarin (COUMADIN) 4 MG tablet Take 3 tablets daily or as directed Patient taking differently: Take 12 mg by mouth every evening.  09/18/18  Yes Branch, Alphonse Guild, MD  albuterol (PROVENTIL HFA;VENTOLIN HFA) 108 (90 BASE) MCG/ACT inhaler Inhale 2 puffs into the lungs every 6 (six) hours as needed. For shortness of breath    [provider]  nitroGLYCERIN (NITROSTAT) 0.4 MG SL tablet Place 1 tablet (0.4 mg total) under the tongue every 5 (five) minutes x 3 doses as needed. For chest pain 03/08/18   Evans Lance, MD  ondansetron (ZOFRAN) 4 MG tablet Take 4 mg by mouth 4 (four) times daily as needed for nausea or vomiting.    [provider]    Past Medical History: Past Medical History:  Diagnosis Date  . Arthritis   . Biventricular ICD (implantable cardioverter-defibrillator) in place   . CAD (coronary artery disease)    Acute MI in 2006 -> LAD stent  . Cardiomyopathy ([place])   . Carpal tunnel syndrome   . Chronic systolic (congestive) heart failure ([place])   . Colon cancer ([place]) 2006   Partial colectomy in 2006  . Complete  heart block ([place])    S/P ICD 02/09/13  . COPD (chronic obstructive pulmonary disease) ([place])   . Diabetes mellitus, type II ([place])   . H/O alcohol dependence ([place])   . H/O hiatal hernia   . History of pneumonia 2013  . Hyperlipidemia   . Hypertension   . Myocardial infarction [facility])    Hx: of 2006  . Neuropathy in diabetes ([place])   . PAF (paroxysmal atrial fibrillation) ([place])     Past Surgical History: Past Surgical History:  Procedure Laterality Date  . ANKLE SURGERY     Right  . Biventricular ICD Placement  02/09/2013   Dr. Lovena Le  . COLONOSCOPY  08/17/2005   Pancolonic diverticula/Multiple rectal polyps removed as described above. The larger of the two likely responsible for intermittent hematochezia/ Multiple polyps at the hepatic flexure resected with a snare./ Large polypoid lesion growing out of the base of the cecum, just  behind the  ileocecal valve. This was not felt to be amendable to endoscopic resection. Biopsied multiple times  . CORONARY ANGIOPLASTY WITH STENT PLACEMENT    . ESOPHAGOGASTRODUODENOSCOPY N/A 03/29/2017   Barrett's esophagus, gastritis, duodenitis, focal glandular atypia on biopsy. Needs surveillance June 2019   . FLEXIBLE SIGMOIDOSCOPY    06/13/2006   Essentially normal residual rectum status post total colectomy. anastomosis at 25cm.  Focal area of abnormality adjacent to suture, as described above, likely not significant, suspect granulation tissue, biopsied.  Anastomosis otherwise appeared normal.  Distal terminal ileum appeared normal  . FLEXIBLE SIGMOIDOSCOPY N/A 01/13/2015   Dr. Gala Romney: normal-appearing residual rectum, surgical anastomosis, s/p subtotal colectomy. Surveillance 5 years   . FLEXIBLE SIGMOIDOSCOPY N/A 03/29/2017   Dr. Oneida Alar while inpatient: intermittent rectal bleeding due to ischemic erosion at anastomosis,s/p APC therapy. one 3 mm polyp in recum (hyperplastic). surveillance in 3 years  . INGUINAL HERNIA REPAIR    . MULTIPLE EXTRACTIONS WITH  ALVEOLOPLASTY N/A 04/09/2013   Procedure: Extraction of tooth #'s 1,2,4,5,6,7,8,9,10,11,12,13,17,18,20,21,22,23,27,28, 29,30,31, and 32 with alveoloplasty.;  Surgeon: Lenn Cal, DDS;  Location: [place];  Service: Oral Surgery;  Laterality: N/A;  . PERMANENT PACEMAKER INSERTION N/A 02/09/2013   Procedure: PERMANENT PACEMAKER INSERTION;  Surgeon: Evans Lance, MD;  Location: [facility] CATH LAB;  Service: Cardiovascular;  Laterality: N/A;  . SUBTOTAL COLECTOMY  09/01/2005   subtotal colectomy  . TEMPORARY PACEMAKER INSERTION Right 02/09/2013   Procedure: TEMPORARY PACEMAKER INSERTION;  Surgeon: Sinclair Grooms, MD;  Location: [facility] CATH LAB;  Service: Cardiovascular;  Laterality: Right;    Family History: Family History  Problem Relation Age of Onset  . Colon cancer Mother        Efrain Clauson, ?>age 63.  [name] Heart disease Mother   . Heart disease Father   . Hypertension Brother   . Heart disease Brother   . Diabetes Brother   . Liver disease Neg Hx     Social History: Social History   Socioeconomic History  . Marital status: Married    Spouse name: Not on file  . Number of children: 1  . Years of education: Not on file  . Highest education level: Not on file  Occupational History  . Occupation: Writer    Comment: Disabled  Social Needs  . Financial resource strain: Not on file  . Food insecurity:    Worry: Not on file    Inability: Not on file  . Transportation needs:    Medical: Not on file    Non-medical: Not on file  Tobacco Use  . Smoking status: Current Every Day Smoker    Packs/day: 0.10    Years: 40.00    Pack years: 4.00    Types: Cigarettes    Start date: 07/13/1971    Last attempt to quit: 11/13/2018    Years since quitting: 0.0  . Smokeless tobacco: Never Used  Substance and Sexual Activity  . Alcohol use: No    Alcohol/week: 0.0 standard drinks  . Drug use: No  . Sexual activity: Yes  Lifestyle  . Physical activity:    Days per week: Not on file     Minutes per session: Not on file  . Stress: Not on file  Relationships  . Social connections:    Talks on phone: Not on file    Gets together: Not on file    Attends religious service: Not on file    Active member of club or organization: Not  on file    Attends meetings of clubs or organizations: Not on file    Relationship status: Not on file  Other Topics Concern  . Not on file  Social History Narrative  . Not on file    Allergies:  Allergies  Allergen Reactions  . Vancomycin Itching and Rash    Objective:    Vital Signs:   Temp:  [97.7 F (36.5 C)-98.4 F (36.9 C)] 97.7 F (36.5 C) (02/26 0539) Pulse Rate:  [85-96] 85 (02/26 0734) Resp:  [19-28] 26 (02/26 0734) BP: (137-155)/(62-83) 155/83 (02/26 0734) SpO2:  [85 %-100 %] 100 % (02/26 1308) Weight:  [86.3 kg] 86.3 kg (02/26 0539) Last BM Date: 11/28/18  Weight change: Filed Weights   11/27/18 0500 11/28/18 0622 11/29/18 0539  Weight: 85.8 kg 86 kg 86.3 kg   Intake/Output:   Intake/Output Summary (Last 24 hours) at 11/29/2018 1333 Last data filed at 11/29/2018 9211 Gross per 24 hour  Intake 720 ml  Output 1525 ml  Net -805 ml    Physical Exam    General:  He is in respiratory distress sitting on the edge of the bed.  HEENT: + NRB.  Neck: supple. JVP elevated to jaw. Carotids 2+ bilat; no bruits. No lymphadenopathy or thyromegaly appreciated. Cor: PMI nondisplaced. Regular, tachy. 2/6 MR, ? + S3.  Lungs: Diminished throughout with basilar crackles. Abdomen: Soft. Nontender, nondistended. No hepatosplenomegaly. No bruits or masses. Good bowel sounds. Extremities: no cyanosis, clubbing, or rash. 3+ edema into thighs and flanks.  Neuro: alert & orientedx3, cranial nerves grossly intact. moves all 4 extremities w/o difficulty. Affect distressed.   Telemetry   NSR/ST 90-100s, personally reviewed.   EKG    11/27/2018 V paced complex at 92 bpm with wide QRS 129 ms, personally reviewed  Labs   Basic  Metabolic Panel: Recent Labs  Lab 11/26/18 1645 11/27/18 0437 11/28/18 0434 11/29/18 0408  NA 132* 135 136 134*  K 4.2 4.1 4.2 4.4  CL 92* 94* 95* 92*  CO2 31 30 32 31  GLUCOSE 321* 178* 168* 333*  BUN 31* 27* 34* 43*  CREATININE 1.66* 1.49* 1.64* 1.70*  CALCIUM 8.3* 8.5* 8.6* 8.6*  MG 2.5*  --   --   --   PHOS 4.9*  --   --   --     Liver Function Tests: Recent Labs  Lab 11/26/18 1645  AST 26  ALT 24  ALKPHOS 202*  BILITOT 0.6  PROT 6.2*  ALBUMIN 3.0*   No results for input(s): LIPASE, AMYLASE in the last 168 hours. No results for input(s): AMMONIA in the last 168 hours.  CBC: Recent Labs  Lab 11/26/18 1645 11/27/18 0437  WBC 5.3 6.5  NEUTROABS 3.8  --   HGB 9.5* 9.2*  HCT 31.6* 30.9*  MCV 98.1 97.8  PLT 182 188    Cardiac Enzymes: Recent Labs  Lab 11/26/18 1645 11/26/18 2249 11/27/18 0437  TROPONINI 0.06* 0.06* 0.07*    BNP: BNP (last 3 results) Recent Labs    09/04/18 1517 10/16/18 1536 11/26/18 1645  BNP 605.0* 2,147.0* 1,629.0*    ProBNP (last 3 results) No results for input(s): PROBNP in the last 8760 hours.   CBG: Recent Labs  Lab 11/28/18 1558 11/28/18 2231 11/29/18 0436 11/29/18 0731 11/29/18 1100  GLUCAP 247* 428* 324* 310* 137*    Coagulation Studies: Recent Labs    11/26/18 1645 11/27/18 0437 11/28/18 0434 11/29/18 0408  LABPROT 26.6* 28.0* 30.0* 39.3*  INR 2.49  2.66 2.9* 4.1*     Imaging    No results found.   Medications:     Current Medications: . ferrous sulfate  325 mg Oral BID  . furosemide  80 mg Intravenous BID  . hydrALAZINE  25 mg Oral Q8H  . insulin aspart  0-20 Units Subcutaneous TID WC  . insulin aspart  0-5 Units Subcutaneous QHS  . insulin aspart  4 Units Subcutaneous TID WC  . insulin glargine  12 Units Subcutaneous Daily  . ipratropium-albuterol  3 mL Nebulization Q4H  . magnesium oxide  400 mg Oral BID  . mouth rinse  15 mL Mouth Rinse BID  . pantoprazole  40 mg Oral QAC  breakfast  . predniSONE  40 mg Oral Q breakfast  . primidone  50 mg Oral Daily  . sertraline  50 mg Oral Daily  . simvastatin  40 mg Oral QHS  . sodium chloride flush  10-40 mL Intracatheter Q12H  . Warfarin - Pharmacist Dosing Inpatient   Does not apply q1800     Infusions:     Patient Profile   KENTRELL HALLAHAN is a 63 y.o. male with h/o Chronic Afib, CAD, OA of both hands, chronic combined systolic/diastolic CHF, CHB s/p [employer] Sci BiV ICD, Carpal Tunnel syndrome, h/o Colon CA, COPD, DM2, peripheral neuropathy, ETOH abuse, GERD, HLD, HTN, and h/o tobacco abuse.   Admitted 11/26/2018 with worsening DOE and edema despite adjustment of outpatient diuretics. Moved to [facility] 11/29/18 with low output by PICC line.   Assessment/Plan   1. Acute on chronic combined CHF, ischemic CMP - Echo 09/07/2018 LVEF 20-25%, Diffuse HK, Grade 2 DD, Mild AS, Trivial AI, Mod MR, Sever LAE, RV mildly reduced, Severe RAE, Mod TR, Mod PI, PA peak pressure 50 mmg Hg.  - PICC placed. Coox 49.4% this am. Start 0.25 mcg/kg/min of milrinone. - Volume status markedly elevated. Add CVPs and UNNA boots.  - Give 80 mg IV lasix now, then start lasix gtt at 12 mg/hr. Also give 2.5 mg metolazone and 40 meq of K with marked volume overload and acute distress.  - No ACE/ARB/ARNI yet with AKI - No spiro/dig yet with AKi.  - No BB with acute decomp/low output. - ? Amyloid with CHB, Chronic Afib, LBBB, carpal tunnel, and neuropathy. Will order PYP scan for while here. Though he had a large anterior MI in the past.   2. AKI on CKD III  -Likely cardiorenal.  - Cr 1.49 -> 1.64 -> 1.70.  Not great UOP on lasix so far today. Starting milrinone so hope this will help.   3. Acute on chronic respiratory failure - He does not use his prescribed home O2 - Worse in setting of marked volume overload. BiPAP If not improved.   3. CAD - Had DES for in-stent re-stenosis 11/2005. - No s/s of ischemia.    - Continue statin. Not on ASA  with stable CAD on coumadin.   4. Chronic Afib - He s/p Biv ICD in setting of CHB - He is on coumadin. Denies bleeding.   5. CHB - Stable s/p [employer] Sci BiV ICD  6. COPD - Encouraged smoking cessation.   7. HTN - Will adjust meds in setting of treating his CHF.   8. H/o ETOH abuse - He no longer drinks.    9. H/o Tobacco abuse.  - He quit 1 week ago.  Patient is critically ill and in danger of multiple system organ failure in setting of  low output CHF. Will follow closely. Starting milrinone and giving IV lasix now. Consider PYP scan once more stable. Can't lie back currently.   Medication concerns reviewed with patient and pharmacy team. Barriers identified: None at this time.   Length of Stay: 2  Annamaria Helling  11/29/2018, 1:33 PM  Advanced Heart Failure Team Pager [phone] (M-F; 7a - 4p)  Please contact [facility] for night-coverage after hours (4p -7a ) and weekends on [email or web address]  Patient seen with PA, agree with the above note.   Patient has a long history of ischemic cardiomyopathy, symptomatically worse over several months.  Admitted to [facility] with acute/chronic systolic CHF.  He was transferred to [facility] with evidence for low output failure, co-ox 49% today.  He is orthopneic, sitting at side of bed.  Dyspnea with any movement.  CXR with pulmonary edema.   On exam, JVP 14-16 cm with HJR, distant BS, regular S1S2, 2/6 HSM LLSB, +S3.  2+ edema to thighs.   1. Acute on chronic systolic CHF: Ischemic cardiomyopathy. [employer] CRT-D device.  Most recent echo in 12/19 with EF 20-25%, moderately dilated RV with mildly decreased systolic function, mild AS, moderate MR, moderate TR, PASP 50 mmHg.  PICC in place, co-ox suggested low output at 49%.  He is markedly volume overloaded and symptomatic.  - Lasix 80 mg IV x 1 now then start Lasix gtt at 12 mg/hr.  - I will give a dose of metolazone 2.5 x 1 with Lasix today.  - Start milrinone 0.25  and follow co-ox, CVP.  - Continue hydralazine 25 mg tid with Imdur 30 mg daily with elevated BP.  - He will need RHC when better diuresed.  - I suspect that he is nearing the need for evaluation for advanced therapies.  He lives with his wife, would need to see how bad his COPD is.  2. CAD: Anterior MI in 2006 with PCI to LAD.  DES for in-stent restenosis in 2007.  No cath/intervention since that time.  - Given warfarin anticoagulation, he is not on ASA.  - Continue Zocor.  - Will evaluate with coronary angiography when diuresed and creatinine stabilized.  3. AKI on CKD stage 3: Creatinine up with attempts at diuresis, 1.7 today.  Suspect cardiorenal syndrome.   - Hopefully creatinine will improve with milrinone use.  4. COPD: He recently quit smoking.  Not sure how severe his COPD is.  - Will need PFTs when he is better diuresed.  - he is on prednisone, started at [facility] for ?component of COPD exacerbation. I think that the main issue here is CHF.  I will decrease to 20 mg daily tomorrow then off.  5. Type II diabetes: Glucose has been high on prednisone.  6. Atrial fibrillation: Paroxysmal.  He is in NSR currently.   - INR high, hold warfarin to allow to drift down.  Will likely cover with heparin gtt when INR < 2 to allow for cath.   Loralie Champagne 11/29/2018 4:40 PM

## 2018-11-29 NOTE — Progress Notes (Signed)
Progress Note  Patient Name: [name] Date of Encounter: 11/29/2018  Primary Cardiologist: Dr. Cristopher Peru [facility])  Subjective   Orthopnea somewhat better but has not resolved.  Leg edema improving but not at baseline.  No chest pain or palpitations.  Appetite is fair.  Inpatient Medications    Scheduled Meds: . ferrous sulfate  325 mg Oral BID  . furosemide  80 mg Intravenous BID  . hydrALAZINE  25 mg Oral Q8H  . insulin aspart  0-20 Units Subcutaneous TID WC  . insulin aspart  0-5 Units Subcutaneous QHS  . insulin aspart  4 Units Subcutaneous TID WC  . insulin glargine  12 Units Subcutaneous Daily  . ipratropium-albuterol  3 mL Nebulization Q4H  . magnesium oxide  400 mg Oral BID  . mouth rinse  15 mL Mouth Rinse BID  . pantoprazole  40 mg Oral QAC breakfast  . predniSONE  40 mg Oral Q breakfast  . primidone  50 mg Oral Daily  . sertraline  50 mg Oral Daily  . simvastatin  40 mg Oral QHS  . sodium chloride flush  10-40 mL Intracatheter Q12H  . Warfarin - Pharmacist Dosing Inpatient   Does not apply q1800    PRN Meds: acetaminophen **OR** acetaminophen, albuterol, nitroGLYCERIN, sodium chloride flush   Vital Signs    Vitals:   11/29/18 0509 11/29/18 0539 11/29/18 0729 11/29/18 0734  BP: 139/71 (!) 143/78  (!) 155/83  Pulse:  86  85  Resp:  (!) 24  (!) 26  Temp:  97.7 F (36.5 C)    TempSrc:  Oral    SpO2:  100% 98% 100%  Weight:  86.3 kg    Height:        Intake/Output Summary (Last 24 hours) at 11/29/2018 1221 Last data filed at 11/29/2018 7619 Gross per 24 hour  Intake 720 ml  Output 1525 ml  Net -805 ml   Filed Weights   11/27/18 0500 11/28/18 0622 11/29/18 0539  Weight: 85.8 kg 86 kg 86.3 kg    Telemetry    Ventricular paced rhythm.  Personally reviewed.  Physical Exam   GEN:  Chronically ill-appearing, disheveled.  No distress. Neck:  Elevated JVP. Cardiac: RRR with ectopy, no gallop.  Respiratory:  Decreased breath sounds  consistent with effusions, also crackles at the bases. GI: Soft, nontender, bowel sounds present. MS:  Chronic appearing edema and venous stasis. Neuro:  Nonfocal. Psych: Alert and oriented x 3. Flat affect.  Labs    Chemistry Recent Labs  Lab 11/26/18 1645 11/27/18 0437 11/28/18 0434 11/29/18 0408  NA 132* 135 136 134*  K 4.2 4.1 4.2 4.4  CL 92* 94* 95* 92*  CO2 31 30 32 31  GLUCOSE 321* 178* 168* 333*  BUN 31* 27* 34* 43*  CREATININE 1.66* 1.49* 1.64* 1.70*  CALCIUM 8.3* 8.5* 8.6* 8.6*  PROT 6.2*  --   --   --   ALBUMIN 3.0*  --   --   --   AST 26  --   --   --   ALT 24  --   --   --   ALKPHOS 202*  --   --   --   BILITOT 0.6  --   --   --   GFRNONAA 44* 50* 44* 42*  GFRAA 50* 57* 51* 49*  ANIONGAP 9 11 9 11      Hematology Recent Labs  Lab 11/26/18 1645 11/27/18 0437  WBC 5.3 6.5  RBC 3.22* 3.16*  HGB 9.5* 9.2*  HCT 31.6* 30.9*  MCV 98.1 97.8  MCH 29.5 29.1  MCHC 30.1 29.8*  RDW 14.6 14.6  PLT 182 188    Cardiac Enzymes Recent Labs  Lab 11/26/18 1645 11/26/18 2249 11/27/18 0437  TROPONINI 0.06* 0.06* 0.07*   No results for input(s): TROPIPOC in the last 168 hours.   BNP Recent Labs  Lab 11/26/18 1645  BNP 1,629.0*     Radiology    Dg Chest Port 1 View  Result Date: 11/28/2018 CLINICAL DATA:  PICC line. EXAM: PORTABLE CHEST 1 VIEW COMPARISON:  11/26/2018. FINDINGS: PICC line noted with its tip over superior vena cava. Cardiac pacer stable position. Stable cardiomegaly. Progressive bilateral pulmonary infiltrates/edema and bilateral pleural effusions. No acute bony abnormality. IMPRESSION: 1.  Right PICC line noted with tip over superior vena cava. 2. Cardiomegaly. Progressive bilateral pulmonary infiltrates/edema and bilateral pleural effusions. Electronically Signed   By: Marcello Moores  Register   On: 11/28/2018 13:19   [place] Ekg Site Rite  Result Date: 11/28/2018 If Site Rite image not attached, placement could not be confirmed due to current cardiac  rhythm.   Cardiac Studies   Echocardiogram 09/07/2018: Study Conclusions  - Left ventricle: The cavity size was severely dilated. Systolic   function was severely reduced. The estimated ejection fraction   was in the range of 20% to 25%. Diffuse hypokinesis. Features are   consistent with a pseudonormal left ventricular filling pattern,   with concomitant abnormal relaxation and increased filling   pressure (grade 2 diastolic dysfunction). Doppler parameters are   consistent with elevated ventricular end-diastolic filling   pressure. - Ventricular septum: Septal motion showed abnormal function,   dyssynergy, and paradox. - Aortic valve: There was mild stenosis. There was trivial   regurgitation. Valve area (VTI): 2.04 cm^2. Valve area (Vmax):   2.09 cm^2. Valve area (Vmean): 1.98 cm^2. - Mitral valve: There was moderate regurgitation. - Left atrium: The atrium was severely dilated. - Right ventricle: The cavity size was moderately dilated. Wall   thickness was normal. Systolic function was mildly reduced. - Right atrium: The atrium was severely dilated. - Tricuspid valve: There was moderate regurgitation. - Pulmonic valve: There was moderate regurgitation. - Pulmonary arteries: Systolic pressure was moderately increased.   PA peak pressure: 50 mm Hg (S). - Inferior vena cava: The vessel was dilated. The respirophasic   diameter changes were blunted (< 50%), consistent with elevated   central venous pressure. - Pericardium, extracardiac: There was no pericardial effusion.  Patient Profile     63 y.o. male with a historyof mixedcardiomyopathywith LVEF 20-25%,biventricular ICD in place, CAD status post DES to the LAD in 2006 with anterior infarct, paroxysmal atrial fibrillation on Coumadin, hypertension, hyperlipidemia, tobacco abuse, and type 2 diabetes mellitus with neuropathy now presenting with acute on chronic combined heart failure and volume overload.  Assessment & Plan     1.  Acute on chronic combined heart failure with associated mild right ventricular dysfunction and moderate pulmonary hypertension.  Co-oximetry 61 down to 49 in the setting of diuresis, although blood pressure is stable and in fact not optimally controlled as yet.  Hydralazine has been initiated and uptitrated.  Creatinine has increased from 1.49-1.70.  Net out of approximately 1200 cc last 24 hours with total diuresis of approximately 5200 cc.  2.  Mixed cardiomyopathy with LVEF 20 to 25% by echocardiogram in December 2019, diffuse LV hypokinesis noted as well as moderate diastolic dysfunction and increased filling  pressures.  3.  CAD status post previous anterior wall infarct in 2006 managed with DES to the LAD.  No active angina at this time.  Troponin I trend is not consistent with ACS.  4.  Renal insufficiency, possibly CKD stage II-III at baseline, current creatinine 1.7.  5.  Paroxysmal atrial fibrillation on Coumadin.  Current INR supratherapeutic.  Continue management per pharmacy.  6.  [employer] biventricular ICD in place, followed by Dr. Lovena Le.  Patient with history of complete heart block as well.  Plan will be to transfer the patient to our cardiology service at [facility] for evaluation by the advanced heart failure team.  He has fluid overload that persists although has improved with diuresis, however evidence of low output as well with increasing creatinine.  Suspect at least a limited course of milrinone would be helpful, although he does have room for additional up titration of afterload reducing agents and hopefully can get on a well-rounded regimen when fluid status is back to baseline.  He had only been on Coreg as an outpatient recently in addition to his diuretics.  Signed, Rozann Lesches, MD  11/29/2018, 12:21 PM

## 2018-11-29 NOTE — Progress Notes (Signed)
CRITICAL VALUE ALERT  Critical Value: inr 4.8  Date & Time Notied:  11-29-2018  Provider Notified: Olevia Bowens  Orders Received/Actions taken: MD notified of increased lab.  No new orders received at this time.

## 2018-11-29 NOTE — Progress Notes (Signed)
ANTICOAGULATION CONSULT NOTE - Follow Up Consult  Pharmacy Consult for warfarin Indication: atrial fibrillation  Allergies  Allergen Reactions  . Vancomycin Itching and Rash    Patient Measurements: Height: 6' (182.9 cm) Weight: 190 lb 4.8 oz (86.3 kg) IBW/kg (Calculated) : 77.6  Vital Signs: Temp: 97.7 F (36.5 C) (02/26 0539) Temp Source: Oral (02/26 0539) BP: 155/83 (02/26 0734) Pulse Rate: 85 (02/26 0734)  Labs: Recent Labs    11/26/18 1645 11/26/18 2249 11/27/18 0437 11/28/18 0434 11/29/18 0408  HGB 9.5*  --  9.2*  --   --   HCT 31.6*  --  30.9*  --   --   PLT 182  --  188  --   --   LABPROT 26.6*  --  28.0* 30.0* 39.3*  INR 2.49  --  2.66 2.9* 4.1*  CREATININE 1.66*  --  1.49* 1.64* 1.70*  TROPONINI 0.06* 0.06* 0.07*  --   --     Estimated Creatinine Clearance: 49.5 mL/min (A) (by C-G formula based on SCr of 1.7 mg/dL (H)).   Medications:  Medications Prior to Admission  Medication Sig Dispense Refill Last Dose  . acetaminophen (TYLENOL) 500 MG tablet Take 500 mg by mouth every 6 (six) hours as needed for mild pain or moderate pain.    11/25/2018  . albuterol (PROVENTIL) (2.5 MG/3ML) 0.083% nebulizer solution Take 2.5 mg by nebulization every 6 (six) hours as needed. For shortness of breath   11/25/2018  . ferrous sulfate 325 (65 FE) MG EC tablet Take 325 mg by mouth 2 (two) times daily.   11/25/2018  . insulin glargine (LANTUS) 100 UNIT/ML injection Inject 20 Units into the skin daily as needed (for high blood sugar).    11/25/2018  . Lactobacillus (ACIDOPHOLUS PO) Take 1 tablet by mouth daily.   11/25/2018  . magnesium oxide (MAG-OX) 400 MG tablet Take 400 mg by mouth 2 (two) times daily.   11/25/2018  . metolazone (ZAROXOLYN) 2.5 MG tablet Take 2.5 mg (1 tablet) on Monday, Wednesday & Friday 30 minutes prior to taking torsemide. 30 tablet 3 11/25/2018  . Multiple Vitamin (MULTIVITAMIN) tablet Take 1 tablet by mouth daily.   11/25/2018  . pantoprazole (PROTONIX)  40 MG tablet Take 1 tablet (40 mg total) by mouth daily before breakfast. 30 tablet 12 11/25/2018  . primidone (MYSOLINE) 50 MG tablet Take 50 mg by mouth daily.    11/25/2018  . sertraline (ZOLOFT) 50 MG tablet Take 50 mg by mouth daily.   11/25/2018  . simvastatin (ZOCOR) 40 MG tablet Take 40 mg by mouth at bedtime.    11/25/2018  . torsemide (DEMADEX) 20 MG tablet Take 2 tablets (40 mg total) by mouth 2 (two) times daily. 360 tablet 3 11/25/2018  . warfarin (COUMADIN) 4 MG tablet Take 3 tablets daily or as directed (Patient taking differently: Take 12 mg by mouth every evening. ) 260 tablet 3 11/25/2018 at 2000  . albuterol (PROVENTIL HFA;VENTOLIN HFA) 108 (90 BASE) MCG/ACT inhaler Inhale 2 puffs into the lungs every 6 (six) hours as needed. For shortness of breath   Taking  . nitroGLYCERIN (NITROSTAT) 0.4 MG SL tablet Place 1 tablet (0.4 mg total) under the tongue every 5 (five) minutes x 3 doses as needed. For chest pain 25 tablet 3  at Unknown time  . ondansetron (ZOFRAN) 4 MG tablet Take 4 mg by mouth 4 (four) times daily as needed for nausea or vomiting.   Taking   Scheduled:  .  ferrous sulfate  325 mg Oral BID  . furosemide  80 mg Intravenous BID  . hydrALAZINE  12.5 mg Oral Q8H  . insulin aspart  0-20 Units Subcutaneous TID WC  . insulin aspart  0-5 Units Subcutaneous QHS  . insulin aspart  4 Units Subcutaneous TID WC  . insulin glargine  12 Units Subcutaneous Daily  . ipratropium-albuterol  3 mL Nebulization Q4H  . magnesium oxide  400 mg Oral BID  . mouth rinse  15 mL Mouth Rinse BID  . pantoprazole  40 mg Oral QAC breakfast  . predniSONE  40 mg Oral Q breakfast  . primidone  50 mg Oral Daily  . sertraline  50 mg Oral Daily  . simvastatin  40 mg Oral QHS  . sodium chloride flush  10-40 mL Intracatheter Q12H  . Warfarin - Pharmacist Dosing Inpatient   Does not apply q1800   Infusions:   PRN: acetaminophen **OR** acetaminophen, albuterol, nitroGLYCERIN, sodium chloride  flush Anti-infectives (From admission, onward)   None      Assessment: 63 year old male on warfarin PTA for atrial fibrillation. INR is supratherapeutic at 4.1 which has trended up. Home dose of 8 mg on Wed, 12 mg ROW.  Goal of Therapy:  INR 2-3 Monitor platelets by anticoagulation protocol: Yes   Plan:  No coumadin today Daily PT-INR Monitor for signs and symptoms of bleeding.   Isac Sarna, BS Pharm D, BCPS Clinical Pharmacist Pager [phone] 11/29/2018,8:40 AM

## 2018-11-29 NOTE — Care Management Important Message (Signed)
Important Message  Patient Details  Name: [name] MRN: [id number] Date of Birth: Nov 10, 1955   Medicare Important Message Given:  Yes    Tommy Medal 11/29/2018, 2:35 PM

## 2018-11-29 NOTE — Progress Notes (Signed)
Report called to [facility] at [facility]

## 2018-11-29 NOTE — Progress Notes (Signed)
Transferred-in from [employer] by ambulance with  Non -rebreathing mask with  Pulse ox-99%.

## 2018-11-29 NOTE — Plan of Care (Signed)
  Problem: Education: Goal: Knowledge of General Education information will improve Description Including pain rating scale, medication(s)/side effects and non-pharmacologic comfort measures Outcome: Progressing   Problem: Health Behavior/Discharge Planning: Goal: Ability to manage health-related needs will improve Outcome: Progressing   Problem: Clinical Measurements: Goal: Ability to maintain clinical measurements within normal limits will improve Outcome: Progressing Goal: Will remain free from infection Outcome: Progressing Goal: Diagnostic test results will improve Outcome: Progressing Goal: Respiratory complications will improve Outcome: Progressing Goal: Cardiovascular complication will be avoided Outcome: Progressing   Problem: Activity: Goal: Risk for activity intolerance will decrease Outcome: Progressing   Problem: Nutrition: Goal: Adequate nutrition will be maintained Outcome: Progressing   Problem: Coping: Goal: Level of anxiety will decrease Outcome: Progressing   Problem: Elimination: Goal: Will not experience complications related to bowel motility Outcome: Progressing Goal: Will not experience complications related to urinary retention Outcome: Progressing   Problem: Pain Managment: Goal: General experience of comfort will improve Outcome: Progressing   Problem: Safety: Goal: Ability to remain free from injury will improve Outcome: Progressing   Problem: Education: Goal: Ability to demonstrate management of disease process will improve Outcome: Progressing Goal: Ability to verbalize understanding of medication therapies will improve Outcome: Progressing Goal: Individualized Educational Video(s) Outcome: Progressing   Problem: Activity: Goal: Capacity to carry out activities will improve Outcome: Progressing   Problem: Cardiac: Goal: Ability to achieve and maintain adequate cardiopulmonary perfusion will improve Outcome: Progressing   

## 2018-11-29 NOTE — Progress Notes (Signed)
Subjective: He said he felt a little bit better but woke up with what sounds like PND at about 4:00 this morning.  He said otherwise his breathing is doing a little bit better.  He still has swelling in both legs.  No chest pain.  No other new complaints.  He is now down by about 5 L since admission.  He is upset because his nurse told him today that he was going to be transferred to [facility].  He says he does not want to go to [place].  He had PICC line placed yesterday but his venous oxygen saturation was pretty good so he may not need inotropic therapy  Objective: Vital signs in last 24 hours: Temp:  [97.7 F (36.5 C)-98.4 F (36.9 C)] 97.7 F (36.5 C) (02/26 0539) Pulse Rate:  [85-96] 85 (02/26 0734) Resp:  [19-28] 26 (02/26 0734) BP: (137-155)/(62-83) 155/83 (02/26 0734) SpO2:  [85 %-100 %] 100 % (02/26 0734) Weight:  [86.3 kg] 86.3 kg (02/26 0539) Weight change: 0.272 kg Last BM Date: 11/28/18  Intake/Output from previous day: 02/25 0701 - 02/26 0700 In: 960 [P.O.:960] Out: 2150 [Urine:2150]  PHYSICAL EXAM General appearance: alert, cooperative and Still short of breath at rest and anxious Resp: rales bilaterally Cardio: Heart still generally regular.  I do not hear a gallop GI: soft, non-tender; bowel sounds normal; no masses,  no organomegaly Extremities: The edema of his legs is a little bit better.  He still has venous stasis changes  Lab Results:  Results for orders placed or performed during the hospital encounter of 11/26/18 (from the past 48 hour(s))  Glucose, capillary     Status: Abnormal   Collection Time: 11/27/18 11:19 AM  Result Value Ref Range   Glucose-Capillary 183 (H) 70 - 99 mg/dL   Comment 1 Notify RN    Comment 2 Document in Chart   Glucose, capillary     Status: Abnormal   Collection Time: 11/27/18  4:26 PM  Result Value Ref Range   Glucose-Capillary 212 (H) 70 - 99 mg/dL   Comment 1 Notify RN    Comment 2 Document in Chart   Glucose,  capillary     Status: Abnormal   Collection Time: 11/27/18 10:25 PM  Result Value Ref Range   Glucose-Capillary 191 (H) 70 - 99 mg/dL  Basic metabolic panel     Status: Abnormal   Collection Time: 11/28/18  4:34 AM  Result Value Ref Range   Sodium 136 135 - 145 mmol/L   Potassium 4.2 3.5 - 5.1 mmol/L   Chloride 95 (L) 98 - 111 mmol/L   CO2 32 22 - 32 mmol/L   Glucose, Bld 168 (H) 70 - 99 mg/dL   BUN 34 (H) 8 - 23 mg/dL   Creatinine, Ser 1.64 (H) 0.61 - 1.24 mg/dL   Calcium 8.6 (L) 8.9 - 10.3 mg/dL   GFR calc non Af Amer 44 (L) >60 mL/min   GFR calc Af Amer 51 (L) >60 mL/min   Anion gap 9 5 - 15    Comment: Performed at [facility], [address] [place], [place] [address]  Protime-INR     Status: Abnormal   Collection Time: 11/28/18  4:34 AM  Result Value Ref Range   Prothrombin Time 30.0 (H) 11.4 - 15.2 seconds   INR 2.9 (H) 0.8 - 1.2    Comment: Performed at [facility], [address] [place], [place] [address]  Glucose, capillary     Status: Abnormal  Collection Time: 11/28/18  7:32 AM  Result Value Ref Range   Glucose-Capillary 184 (H) 70 - 99 mg/dL  Glucose, capillary     Status: Abnormal   Collection Time: 11/28/18 11:30 AM  Result Value Ref Range   Glucose-Capillary 123 (H) 70 - 99 mg/dL  .Cooxemetry Panel (carboxy, met, total hgb, O2 sat)     Status: Abnormal   Collection Time: 11/28/18 12:46 PM  Result Value Ref Range   Total hemoglobin 9.5 (L) 12.0 - 16.0 g/dL   O2 Saturation 61.8 %   Carboxyhemoglobin 2.2 (H) 0.5 - 1.5 %   Methemoglobin 0.7 0.0 - 1.5 %    Comment: Performed at [facility], [address] [place], [place] [address]  Glucose, capillary     Status: Abnormal   Collection Time: 11/28/18  3:58 PM  Result Value Ref Range   Glucose-Capillary 247 (H) 70 - 99 mg/dL  Glucose, capillary     Status: Abnormal   Collection Time: 11/28/18 10:31 PM  Result Value Ref Range   Glucose-Capillary 428 (H) 70 - 99 mg/dL  Basic metabolic panel     Status:  Abnormal   Collection Time: 11/29/18  4:08 AM  Result Value Ref Range   Sodium 134 (L) 135 - 145 mmol/L   Potassium 4.4 3.5 - 5.1 mmol/L   Chloride 92 (L) 98 - 111 mmol/L   CO2 31 22 - 32 mmol/L   Glucose, Bld 333 (H) 70 - 99 mg/dL   BUN 43 (H) 8 - 23 mg/dL   Creatinine, Ser 1.70 (H) 0.61 - 1.24 mg/dL   Calcium 8.6 (L) 8.9 - 10.3 mg/dL   GFR calc non Af Amer 42 (L) >60 mL/min   GFR calc Af Amer 49 (L) >60 mL/min   Anion gap 11 5 - 15    Comment: Performed at [facility], [address], [place], [place] [address]  Protime-INR     Status: Abnormal   Collection Time: 11/29/18  4:08 AM  Result Value Ref Range   Prothrombin Time 39.3 (H) 11.4 - 15.2 seconds   INR 4.1 (HH) 0.8 - 1.2    Comment: RESULT REPEATED AND VERIFIED CRITICAL RESULT CALLED TO, READ BACK BY AND VERIFIED WITH: JOHNSON,BREE BY HUFFINES,S ON 11/29/2018 AT 0635. Performed at [facility], [address], [place], [place] [address]   Glucose, capillary     Status: Abnormal   Collection Time: 11/29/18  4:36 AM  Result Value Ref Range   Glucose-Capillary 324 (H) 70 - 99 mg/dL  Glucose, capillary     Status: Abnormal   Collection Time: 11/29/18  7:31 AM  Result Value Ref Range   Glucose-Capillary 310 (H) 70 - 99 mg/dL    ABGS No results for input(s): PHART, PO2ART, TCO2, HCO3 in the last 72 hours.  Invalid input(s): PCO2 CULTURES No results found for this or any previous visit (from the past 240 hour(s)). Studies/Results: Dg Chest Port 1 View  Result Date: 11/28/2018 CLINICAL DATA:  PICC line. EXAM: PORTABLE CHEST 1 VIEW COMPARISON:  11/26/2018. FINDINGS: PICC line noted with its tip over superior vena cava. Cardiac pacer stable position. Stable cardiomegaly. Progressive bilateral pulmonary infiltrates/edema and bilateral pleural effusions. No acute bony abnormality. IMPRESSION: 1.  Right PICC line noted with tip over superior vena cava. 2. Cardiomegaly. Progressive bilateral pulmonary infiltrates/edema and  bilateral pleural effusions. Electronically Signed   By: Marcello Moores  Register   On: 11/28/2018 13:19   [place] Ekg Site Rite  Result Date: 11/28/2018 If  Site Rite image not attached, placement could not be confirmed due to current cardiac rhythm.   Medications:  Prior to Admission:  Medications Prior to Admission  Medication Sig Dispense Refill Last Dose  . acetaminophen (TYLENOL) 500 MG tablet Take 500 mg by mouth every 6 (six) hours as needed for mild pain or moderate pain.    11/25/2018  . albuterol (PROVENTIL) (2.5 MG/3ML) 0.083% nebulizer solution Take 2.5 mg by nebulization every 6 (six) hours as needed. For shortness of breath   11/25/2018  . ferrous sulfate 325 (65 FE) MG EC tablet Take 325 mg by mouth 2 (two) times daily.   11/25/2018  . insulin glargine (LANTUS) 100 UNIT/ML injection Inject 20 Units into the skin daily as needed (for high blood sugar).    11/25/2018  . Lactobacillus (ACIDOPHOLUS PO) Take 1 tablet by mouth daily.   11/25/2018  . magnesium oxide (MAG-OX) 400 MG tablet Take 400 mg by mouth 2 (two) times daily.   11/25/2018  . metolazone (ZAROXOLYN) 2.5 MG tablet Take 2.5 mg (1 tablet) on Monday, Wednesday & Friday 30 minutes prior to taking torsemide. 30 tablet 3 11/25/2018  . Multiple Vitamin (MULTIVITAMIN) tablet Take 1 tablet by mouth daily.   11/25/2018  . pantoprazole (PROTONIX) 40 MG tablet Take 1 tablet (40 mg total) by mouth daily before breakfast. 30 tablet 12 11/25/2018  . primidone (MYSOLINE) 50 MG tablet Take 50 mg by mouth daily.    11/25/2018  . sertraline (ZOLOFT) 50 MG tablet Take 50 mg by mouth daily.   11/25/2018  . simvastatin (ZOCOR) 40 MG tablet Take 40 mg by mouth at bedtime.    11/25/2018  . torsemide (DEMADEX) 20 MG tablet Take 2 tablets (40 mg total) by mouth 2 (two) times daily. 360 tablet 3 11/25/2018  . warfarin (COUMADIN) 4 MG tablet Take 3 tablets daily or as directed (Patient taking differently: Take 12 mg by mouth every evening. ) 260 tablet 3 11/25/2018 at  2000  . albuterol (PROVENTIL HFA;VENTOLIN HFA) 108 (90 BASE) MCG/ACT inhaler Inhale 2 puffs into the lungs every 6 (six) hours as needed. For shortness of breath   Taking  . nitroGLYCERIN (NITROSTAT) 0.4 MG SL tablet Place 1 tablet (0.4 mg total) under the tongue every 5 (five) minutes x 3 doses as needed. For chest pain 25 tablet 3  at Unknown time  . ondansetron (ZOFRAN) 4 MG tablet Take 4 mg by mouth 4 (four) times daily as needed for nausea or vomiting.   Taking   Scheduled: . ferrous sulfate  325 mg Oral BID  . furosemide  80 mg Intravenous BID  . hydrALAZINE  12.5 mg Oral Q8H  . insulin aspart  0-20 Units Subcutaneous TID WC  . insulin aspart  0-5 Units Subcutaneous QHS  . insulin aspart  4 Units Subcutaneous TID WC  . insulin glargine  12 Units Subcutaneous Daily  . ipratropium-albuterol  3 mL Nebulization Q4H  . magnesium oxide  400 mg Oral BID  . mouth rinse  15 mL Mouth Rinse BID  . pantoprazole  40 mg Oral QAC breakfast  . predniSONE  40 mg Oral Q breakfast  . primidone  50 mg Oral Daily  . sertraline  50 mg Oral Daily  . simvastatin  40 mg Oral QHS  . sodium chloride flush  10-40 mL Intracatheter Q12H  . Warfarin - Pharmacist Dosing Inpatient   Does not apply q1800   Continuous:  [id number] **OR** acetaminophen, albuterol, nitroGLYCERIN, sodium chloride flush  Assesment: He was admitted with acute on chronic combined systolic and diastolic heart failure.  He has been having more trouble for the last month or so and has had multiple visits with Dr. Crissie Sickles his electrophysiologist and in my office.  He had a fairly recent episode of acute renal failure and he does have chronic renal failure at baseline.  He has coronary disease but he is not having any chest pain.  He is down about 5 L now.  I am not sure his weights are accurate.  He may require Lasix drip and I discussed that with him.  He is concerned about the potential of transfer to [facility] in [place]  and says that he would not want to do that.  I explained to him that he may need testing or treatment that we cannot provide here.  He has atrial fib and he is chronically anticoagulated.  INR is 4.1  As far as his chronic renal failure is concerned his creatinine today is 1.7  He has diabetes which is not controlled and I have changed his medications  He has COPD at baseline and he is on some prednisone which is creating some of the problem with his blood sugar Principal Problem:   Acute on chronic combined systolic and diastolic congestive heart failure (HCC) Active Problems:   COPD (chronic obstructive pulmonary disease) (HCC)   Arteriosclerotic cardiovascular disease (ASCVD)   Diabetes mellitus, type II (HCC)   Hypertension   Hyperlipidemia   Tobacco abuse   Atrial fibrillation (HCC)   IDA (iron deficiency anemia)   Barrett's esophagus   Acute on chronic combined systolic (congestive) and diastolic (congestive) heart failure (HCC)   Elevated troponin    Plan: Continue current treatments.  Will discuss with cardiology re-Lasix drip.    LOS: 2 days   Alonza Bogus 11/29/2018, 7:56 AM

## 2018-11-29 NOTE — Progress Notes (Signed)
ANTICOAGULATION CONSULT NOTE - Follow Up Consult  Pharmacy Consult for warfarin > heparin  Indication: atrial fibrillation  Allergies  Allergen Reactions  . Vancomycin Itching and Rash    Patient Measurements: Height: 6' (182.9 cm) Weight: 189 lb 9.5 oz (86 kg) IBW/kg (Calculated) : 77.6  Vital Signs: Temp: 97.8 F (36.6 C) (02/26 1610) Temp Source: Tympanic (02/26 1610) BP: 141/76 (02/26 1610) Pulse Rate: 99 (02/26 1610)  Labs: Recent Labs    11/26/18 2249 11/27/18 0437 11/28/18 0434 11/29/18 0408  HGB  --  9.2*  --   --   HCT  --  30.9*  --   --   PLT  --  188  --   --   LABPROT  --  28.0* 30.0* 39.3*  INR  --  2.66 2.9* 4.1*  CREATININE  --  1.49* 1.64* 1.70*  TROPONINI 0.06* 0.07*  --   --     Estimated Creatinine Clearance: 49.5 mL/min (A) (by C-G formula based on SCr of 1.7 mg/dL (H)).   Medications:  Medications Prior to Admission  Medication Sig Dispense Refill Last Dose  . acetaminophen (TYLENOL) 500 MG tablet Take 500 mg by mouth every 6 (six) hours as needed for mild pain or moderate pain.    11/25/2018  . albuterol (PROVENTIL) (2.5 MG/3ML) 0.083% nebulizer solution Take 2.5 mg by nebulization every 6 (six) hours as needed. For shortness of breath   11/25/2018  . ferrous sulfate 325 (65 FE) MG EC tablet Take 325 mg by mouth 2 (two) times daily.   11/25/2018  . insulin glargine (LANTUS) 100 UNIT/ML injection Inject 20 Units into the skin daily as needed (for high blood sugar).    11/25/2018  . Lactobacillus (ACIDOPHOLUS PO) Take 1 tablet by mouth daily.   11/25/2018  . magnesium oxide (MAG-OX) 400 MG tablet Take 400 mg by mouth 2 (two) times daily.   11/25/2018  . metolazone (ZAROXOLYN) 2.5 MG tablet Take 2.5 mg (1 tablet) on Monday, Wednesday & Friday 30 minutes prior to taking torsemide. 30 tablet 3 11/25/2018  . Multiple Vitamin (MULTIVITAMIN) tablet Take 1 tablet by mouth daily.   11/25/2018  . pantoprazole (PROTONIX) 40 MG tablet Take 1 tablet (40 mg total)  by mouth daily before breakfast. 30 tablet 12 11/25/2018  . primidone (MYSOLINE) 50 MG tablet Take 50 mg by mouth daily.    11/25/2018  . sertraline (ZOLOFT) 50 MG tablet Take 50 mg by mouth daily.   11/25/2018  . simvastatin (ZOCOR) 40 MG tablet Take 40 mg by mouth at bedtime.    11/25/2018  . torsemide (DEMADEX) 20 MG tablet Take 2 tablets (40 mg total) by mouth 2 (two) times daily. 360 tablet 3 11/25/2018  . warfarin (COUMADIN) 4 MG tablet Take 3 tablets daily or as directed (Patient taking differently: Take 12 mg by mouth every evening. ) 260 tablet 3 11/25/2018 at 2000  . albuterol (PROVENTIL HFA;VENTOLIN HFA) 108 (90 BASE) MCG/ACT inhaler Inhale 2 puffs into the lungs every 6 (six) hours as needed. For shortness of breath   Taking  . nitroGLYCERIN (NITROSTAT) 0.4 MG SL tablet Place 1 tablet (0.4 mg total) under the tongue every 5 (five) minutes x 3 doses as needed. For chest pain 25 tablet 3  at Unknown time  . ondansetron (ZOFRAN) 4 MG tablet Take 4 mg by mouth 4 (four) times daily as needed for nausea or vomiting.   Taking   Scheduled:  . ferrous sulfate  325 mg Oral  BID  . hydrALAZINE  25 mg Oral Q8H  . insulin aspart  0-20 Units Subcutaneous TID WC  . insulin aspart  0-5 Units Subcutaneous QHS  . insulin aspart  4 Units Subcutaneous TID WC  . insulin glargine  12 Units Subcutaneous Daily  . ipratropium-albuterol  3 mL Nebulization Q4H  . isosorbide mononitrate  30 mg Oral Daily  . magnesium oxide  400 mg Oral BID  . mouth rinse  15 mL Mouth Rinse BID  . metolazone  2.5 mg Oral Once  . pantoprazole  40 mg Oral QAC breakfast  . potassium chloride  40 mEq Oral Once  . [START ON 11/30/2018] predniSONE  20 mg Oral Q breakfast  . primidone  50 mg Oral Daily  . sertraline  50 mg Oral Daily  . simvastatin  40 mg Oral QHS  . sodium chloride flush  10-40 mL Intracatheter Q12H   Infusions:  . furosemide (LASIX) infusion    . milrinone 0.25 mcg/kg/min (11/29/18 1629)   PRN: acetaminophen  **OR** acetaminophen, albuterol, nitroGLYCERIN, sodium chloride flush Anti-infectives (From admission, onward)   None      Assessment: 63 year old male on warfarin PTA for atrial fibrillation. INR is supratherapeutic at 4.1 which has trended up on home dose. Now plan to hold warfarin for possible procedures and begin heparin when INR < 2  Home dose of 8 mg on Wed, 12 mg ROW.  Goal of Therapy:  HL 0.3-0.7 INR 2-3 Monitor platelets by anticoagulation protocol: Yes   Plan:  Hold warfarin Daily PT-INR Begin heparin when INR < 2 Monitor for signs and symptoms of bleeding.   Bonnita Nasuti Pharm.D. CPP, BCPS Clinical Pharmacist [phone] 11/29/2018 4:56 PM

## 2018-11-29 NOTE — Progress Notes (Signed)
Orthopedic Tech Progress Note Patient Details:  [name] Oct 18, 1955 [id number]  Ortho Devices Type of Ortho Device: Unna boot Ortho Device/Splint Interventions: Ordered, Application, Adjustment   Post Interventions Instructions Provided: Adjustment of device, Care of device   [name] 11/29/2018, 6:27 PM

## 2018-11-30 LAB — BASIC METABOLIC PANEL
Anion gap: 9 (ref 5–15)
BUN: 43 mg/dL — ABNORMAL HIGH (ref 8–23)
CHLORIDE: 90 mmol/L — AB (ref 98–111)
CO2: 33 mmol/L — ABNORMAL HIGH (ref 22–32)
Calcium: 8.6 mg/dL — ABNORMAL LOW (ref 8.9–10.3)
Creatinine, Ser: 1.84 mg/dL — ABNORMAL HIGH (ref 0.61–1.24)
GFR calc Af Amer: 45 mL/min — ABNORMAL LOW (ref >60)
GFR calc non Af Amer: 38 mL/min — ABNORMAL LOW (ref >60)
GLUCOSE: 322 mg/dL — AB (ref 70–99)
Potassium: 4.5 mmol/L (ref 3.5–5.1)
Sodium: 132 mmol/L — ABNORMAL LOW (ref 135–145)

## 2018-11-30 LAB — PROTIME-INR
INR: 2 — ABNORMAL HIGH (ref 0.8–1.2)
Prothrombin Time: 22.5 seconds — ABNORMAL HIGH (ref 11.4–15.2)

## 2018-11-30 LAB — GLUCOSE, CAPILLARY
Glucose-Capillary: 347 mg/dL — ABNORMAL HIGH (ref 70–99)
Glucose-Capillary: 280 mg/dL — ABNORMAL HIGH (ref 70–99)
Glucose-Capillary: 207 mg/dL — ABNORMAL HIGH (ref 70–99)
Glucose-Capillary: 152 mg/dL — ABNORMAL HIGH (ref 70–99)
Glucose-Capillary: 238 mg/dL — ABNORMAL HIGH (ref 70–99)

## 2018-11-30 LAB — COOXEMETRY PANEL
Carboxyhemoglobin: 2 % — ABNORMAL HIGH (ref 0.5–1.5)
Methemoglobin: 1.6 % — ABNORMAL HIGH (ref 0.0–1.5)
O2 Saturation: 60.8 %
Total hemoglobin: 8.8 g/dL — ABNORMAL LOW (ref 12.0–16.0)

## 2018-11-30 LAB — HEPARIN LEVEL (UNFRACTIONATED): Heparin Unfractionated: <0.1 IU/mL — ABNORMAL LOW (ref 0.30–0.70)

## 2018-11-30 LAB — MAGNESIUM: MAGNESIUM: 2 mg/dL (ref 1.7–2.4)

## 2018-11-30 MED ORDER — HEPARIN (PORCINE) 25000 UT/250ML-% IV SOLN
1800.0000 [IU]/h | INTRAVENOUS | Status: DC
  Administered 2018-11-30: 900 [IU]/h via INTRAVENOUS
  Administered 2018-12-01: 1350 [IU]/h via INTRAVENOUS
  Administered 2018-12-02: 1800 [IU]/h via INTRAVENOUS
  Administered 2018-12-02: 1500 [IU]/h via INTRAVENOUS
  Administered 2018-12-04 – 2018-12-06 (×3): 1800 [IU]/h via INTRAVENOUS
  Filled 2018-11-30 (×9): qty 250

## 2018-11-30 MED ORDER — HYDRALAZINE HCL 25 MG PO TABS
37.5000 mg | ORAL_TABLET | Freq: Three times a day (TID) | ORAL | Status: DC
  Administered 2018-11-30 – 2018-12-01 (×3): 37.5 mg via ORAL
  Filled 2018-11-30 (×3): qty 1.5

## 2018-11-30 NOTE — Progress Notes (Addendum)
Advanced Heart Failure Rounding Note  PCP-Cardiologist: Carlyle Dolly, MD   Subjective:    Coox 60.8% on milrinone 0.25 mcg/kg/min. Weight shows down 1 lb on lasix gtt, though he is negative 2.5 L, so ? Accuracy.   CVP 12-13 on my check.   No longer orthopneic, sleeping soundly on my arrival, when yesterday was apprehensive of lying back in bed. Denies any SOB currently. O2 sat 95% on O2 via [place].   INR 2.0. Cr 1.7 -> 1.84.  Objective:   Weight Range: 85.3 kg Body mass index is 25.5 kg/m.   Vital Signs:   Temp:  [97.5 F (36.4 C)-97.9 F (36.6 C)] 97.9 F (36.6 C) (02/27 0348) Pulse Rate:  [85-99] 93 (02/26 2025) Resp:  [14-28] 14 (02/27 0348) BP: (132-155)/(64-93) 143/66 (02/27 0627) SpO2:  [94 %-100 %] 100 % (02/27 0348) Weight:  [85.3 kg-86 kg] 85.3 kg (02/27 0620) Last BM Date: 11/29/18  Weight change: Filed Weights   11/29/18 0539 11/29/18 1600 11/30/18 0620  Weight: 86.3 kg 86 kg 85.3 kg    Intake/Output:   Intake/Output Summary (Last 24 hours) at 11/30/2018 0718 Last data filed at 11/30/2018 7062 Gross per 24 hour  Intake 217.6 ml  Output 2725 ml  Net -2507.4 ml     Physical Exam    General:  NAD at this time.  HEENT: Normal Neck: Supple. JVP to jaw. Carotids 2+ bilat; no bruits. No lymphadenopathy or thyromegaly appreciated. Cor: PMI lateral. Regular, tachy. 2/6 MR. ?+ S3 Lungs: Diminished throughout.  Abdomen: Edematous, nontender, nondistended. No hepatosplenomegaly. No bruits or masses. Good bowel sounds. Extremities: No cyanosis, clubbing, or rash. 3+ edema into thighs and flanks.  Neuro: Alert & orientedx3, cranial nerves grossly intact. moves all 4 extremities w/o difficulty. Affect flat but appropriate.  Telemetry   V paced 80-90s, personally reviewed. Difficult to tell if underlying Afib.   EKG    No new tracings.    Labs    CBC No results for input(s): WBC, NEUTROABS, HGB, HCT, MCV, PLT in the last 72 hours. Basic Metabolic  Panel Recent Labs    11/29/18 0408 11/30/18 0430  NA 134* 132*  K 4.4 4.5  CL 92* 90*  CO2 31 33*  GLUCOSE 333* 322*  BUN 43* 43*  CREATININE 1.70* 1.84*  CALCIUM 8.6* 8.6*  MG  --  2.0   Liver Function Tests No results for input(s): AST, ALT, ALKPHOS, BILITOT, PROT, ALBUMIN in the last 72 hours. No results for input(s): LIPASE, AMYLASE in the last 72 hours. Cardiac Enzymes No results for input(s): CKTOTAL, CKMB, CKMBINDEX, TROPONINI in the last 72 hours.  BNP: BNP (last 3 results) Recent Labs    09/04/18 1517 10/16/18 1536 11/26/18 1645  BNP 605.0* 2,147.0* 1,629.0*   ProBNP (last 3 results) No results for input(s): PROBNP in the last 8760 hours.  D-Dimer No results for input(s): DDIMER in the last 72 hours. Hemoglobin A1C No results for input(s): HGBA1C in the last 72 hours. Fasting Lipid Panel No results for input(s): CHOL, HDL, LDLCALC, TRIG, CHOLHDL, LDLDIRECT in the last 72 hours. Thyroid Function Tests No results for input(s): TSH, T4TOTAL, T3FREE, THYROIDAB in the last 72 hours.  Invalid input(s): FREET3  Other results:  Imaging    No results found.  Medications:    Scheduled Medications: . ferrous sulfate  325 mg Oral BID  . hydrALAZINE  25 mg Oral Q8H  . insulin aspart  0-20 Units Subcutaneous TID WC  . insulin aspart  0-5 Units Subcutaneous QHS  . insulin aspart  4 Units Subcutaneous TID WC  . insulin glargine  12 Units Subcutaneous Daily  . isosorbide mononitrate  30 mg Oral Daily  . magnesium oxide  400 mg Oral BID  . mouth rinse  15 mL Mouth Rinse BID  . pantoprazole  40 mg Oral QAC breakfast  . predniSONE  20 mg Oral Q breakfast  . primidone  50 mg Oral Daily  . sertraline  50 mg Oral Daily  . simvastatin  40 mg Oral QHS  . sodium chloride flush  10-40 mL Intracatheter Q12H     Infusions: . furosemide (LASIX) infusion 12 mg/hr (11/30/18 0400)  . milrinone 0.25 mcg/kg/min (11/30/18 6967)     PRN Medications:  acetaminophen  **OR** acetaminophen, albuterol, nitroGLYCERIN, sodium chloride flush, sodium chloride flush  Patient Profile   [name] is a 63 y.o. male  with h/o Paroxysmal Afib, CAD, OA of both hands, chronic combined systolic/diastolic CHF, CHB s/p [employer] Sci BiV ICD, Carpal Tunnel syndrome, h/o Colon CA, COPD, DM2, peripheral neuropathy, ETOH abuse, GERD, HLD, HTN, and h/o tobacco abuse.   Admitted 11/26/2018 with worsening DOE and edema despite adjustment of outpatient diuretics. Moved to [facility] 11/29/18 with low output by PICC line.   Assessment/Plan   1. Acute on chronic systolic CHF: Ischemic cardiomyopathy. [employer] CRT-D device.  Most recent echo in 12/19 with EF 20-25%, moderately dilated RV with mildly decreased systolic function, mild AS, moderate MR, moderate TR, PASP 50 mmHg.  PICC in place, co-ox suggested low output at 49%.  He remains markedly volume overloaded. Breathing improved.  Coox 60.8% on milrinone 0.25 mcg/kg/min.  - Continue lasix gtt 12 mg/hr.  - Repeat metolazone 2.5 ng x 1.  - Increase hydralazine to 37.5 mg tid with Imdur 30 mg daily with elevated BP.  - He will need RHC when better diuresed.  - Suspect that he is nearing the need for evaluation for advanced therapies.  He lives with his wife, would need to see how bad his COPD is and how his kidney function settles out.  2. CAD: Anterior MI in 2006 with PCI to LAD.  DES for in-stent restenosis in 2007.  No cath/intervention since that time.  - Given warfarin anticoagulation, he is not on ASA.  - Continue Zocor.  - Will evaluate with coronary angiography when diuresed and creatinine stabilized.  3. AKI on CKD stage 3: - Creatinine up with attempts at diuresis. - Cr 1.7 -> 1.84 today. Suspect cardiorenal syndrome.   4. COPD: He recently quit smoking.  Not sure how severe his COPD is.  - Will need PFTs when he is better diuresed.  - Continue prednisone 20 mg daily for now, will wean off. Suspect primarily CHF.  5.  Type II diabetes:  - Follow with SSI. Glucose has been high on prednisone.  6. Atrial fibrillation: Paroxysmal.  - Appears to be in NSR - Holding coumadin to allow INR to trend down for cath. INR 2.0 this am. Start heparin once INR < 1.8.  Medication concerns reviewed with patient and pharmacy team. Barriers identified: None at this time.   Length of Stay: 3  Annamaria Helling  11/30/2018, 7:18 AM  Advanced Heart Failure Team Pager [phone] (M-F; 7a - 4p)  Please contact [facility] for night-coverage after hours (4p -7a ) and weekends on [email or web address]  Patient seen with PA, agree with the above note.   He looks more comfortable today,  less orthopneic.  Still with dyspnea.  On exam, JVP 14-16 cm.  Regular S1S2, +S3.  1+ edema to thighs.   CVP 13 today with co-ox 61% on milrinone 0.25.  He diuresed well on current regimen.  Still volume overloaded. Creatinine slightly higher at 1.84.  - Continue milrinone 0.25 - Continue Lasix gtt at 12 mg/hr and will give a dose of metolazone again.  - May gradually titrate up hydralazine/Imdur.  - When volume better compensated and creatinine stabilized, will need right/left heart cath (warfarin on hold, will use heparin gtt when INR < 2).  - Suspect we will need to start thinking about advanced therapies.  Will need eventual PFTs to see how bad his COPD is.   Loralie Champagne 11/30/2018 7:40 AM

## 2018-11-30 NOTE — Progress Notes (Signed)
Inpatient Diabetes Program Recommendations  AACE/ADA: New Consensus Statement on Inpatient Glycemic Control (2015)  Target Ranges:  Prepandial:   less than 140 mg/dL      Peak postprandial:   less than 180 mg/dL (1-2 hours)      Critically ill patients:  140 - 180 mg/dL   Lab Results  Component Value Date   GLUCAP 280 (H) 11/30/2018   HGBA1C 7.3 (H) 09/04/2018    Review of Glycemic Control  Diabetes history: DM 2 Outpatient Diabetes medications: Lantus 20 units Daily Current orders for Inpatient glycemic control: Lantus 12 units Daily, Novolog 0-20 units tid, Novolog 0-5 units qhs,  Novolog 4 units tid meal cvoerage  Inpatient Diabetes Program Recommendations:    Glucose trends still elevated. Consider increasing Lantus closer to home dose, Lantus 18 units.  Thanks,  Tama Headings RN, MSN, BC-ADM Inpatient Diabetes Coordinator Team Pager [phone] (8a-5p)

## 2018-11-30 NOTE — Evaluation (Signed)
Physical Therapy Evaluation Patient Details Name: [name] MRN: [id number] DOB: 06/09/1956 Today's Date: 11/30/2018   History of Present Illness  [name] is a 63 y.o. male with medical history significant of chronic atrial fibrillation, normocytic anemia, CAD, osteoarthritis of hands, chronic combined systolic and diastolic heart failure, history of complete heart block, history of AICD placement, carpal tunnel syndrome, history of colon cancer with partial colectomy in 2006, COPD, CAD, type 2 diabetes, diabetic peripheral neuropathy, history of alcohol abuse, GERD, hiatal hernia, hyperlipidemia, hypertension, history of pneumonia, history of tobacco abuse with abstinence for the past 2 weeks who is coming to the emergency department with progressively worse shortness of breath for the past few days.  Work up includes pulmonary edema.  Clinical Impression  Pt admitted with/for SOB due to pulmonary edema.  Pt presently needing min guard at most, but still reports feeling SOB during gait.[name]  Pt currently limited functionally due to the problems listed below.  (see problems list.)  Pt will benefit from PT to maximize function and safety to be able to get home safely with available assist .     Follow Up Recommendations Home health PT;Supervision/Assistance - 24 hour    Equipment Recommendations  None recommended by PT    Recommendations for Other Services       Precautions / Restrictions Precautions Precautions: Fall      Mobility  Bed Mobility Overal bed mobility: Modified Independent                Transfers Overall transfer level: Needs assistance   Transfers: Sit to/from Stand Sit to Stand: Supervision            Ambulation/Gait Ambulation/Gait assistance: Min guard Gait Distance (Feet): 120 Feet Assistive device: IV Pole Gait Pattern/deviations: Step-through pattern   Gait velocity interpretation: <1.8 ft/sec, indicate of risk for recurrent  falls General Gait Details: Generally steady pushing IV pole on 3L [place].  Initially SpO2 held at 93% then started slowly falling to 87% while ambulating.  EHR in the upper 90's to 110 bpm.  minimal dyspnea.  Stairs            Wheelchair Mobility    Modified Rankin (Stroke Patients Only)       Balance Overall balance assessment: No apparent balance deficits (not formally assessed)                                           Pertinent Vitals/Pain Pain Assessment: No/denies pain    Home Living Family/patient expects to be discharged to:: Private residence Living Arrangements: Spouse/significant other;Other relatives Available Help at Discharge: Family;Available 24 hours/day Type of Home: House Home Access: Level entry     Home Layout: Other (Comment) Home Equipment: Grab bars - toilet;Grab bars - tub/shower      Prior Function Level of Independence: Independent               Hand Dominance   Dominant Hand: Right    Extremity/Trunk Assessment   Upper Extremity Assessment Upper Extremity Assessment: Defer to OT evaluation    Lower Extremity Assessment Lower Extremity Assessment: Overall WFL for tasks assessed(mild proximal weakness)       Communication   Communication: No difficulties  Cognition Arousal/Alertness: Awake/alert Behavior During Therapy: WFL for tasks assessed/performed Overall Cognitive Status: Within Functional Limits for tasks assessed  General Comments      Exercises     Assessment/Plan    PT Assessment Patient needs continued PT services  PT Problem List Decreased strength;Decreased activity tolerance;Decreased balance;Decreased mobility;Cardiopulmonary status limiting activity       PT Treatment Interventions Gait training;DME instruction;Stair training;Functional mobility training;Therapeutic activities;Patient/family education    PT Goals (Current  goals can be found in the Care Plan section)  Acute Rehab PT Goals Patient Stated Goal: home Independent PT Goal Formulation: With patient Time For Goal Achievement: 12/07/18 Potential to Achieve Goals: Good    Frequency Min 3X/week   Barriers to discharge        Co-evaluation               AM-PAC PT "6 Clicks" Mobility  Outcome Measure Help needed turning from your back to your side while in a flat bed without using bedrails?: None Help needed moving from lying on your back to sitting on the side of a flat bed without using bedrails?: None Help needed moving to and from a bed to a chair (including a wheelchair)?: None Help needed standing up from a chair using your arms (e.g., wheelchair or bedside chair)?: None Help needed to walk in hospital room?: A Little Help needed climbing 3-5 steps with a railing? : A Little 6 Click Score: 22    End of Session   Activity Tolerance: Patient tolerated treatment well Patient left: in bed;with call bell/phone within reach;Other (comment)(sitting EOB) Nurse Communication: Mobility status PT Visit Diagnosis: Unsteadiness on feet (R26.81);Difficulty in walking, not elsewhere classified (R26.2)    Time: 4239-5320 PT Time Calculation (min) (ACUTE ONLY): 16 min   Charges:   PT Evaluation $PT Eval Low Complexity: 1 Low          11/30/2018  Donnella Sham, PT Acute Rehabilitation Services [phone]  (pager) [phone]  (office)  Tessie Fass Jagdeep Ancheta 11/30/2018, 4:31 PM

## 2018-11-30 NOTE — Progress Notes (Signed)
ANTICOAGULATION CONSULT NOTE - Follow Up Consult  Pharmacy Consult for Heparin Indication: atrial fibrillation  Allergies  Allergen Reactions  . Vancomycin Itching and Rash    Patient Measurements: Height: 6' (182.9 cm) Weight: 188 lb 0.8 oz (85.3 kg) IBW/kg (Calculated) : 77.6 Heparin Dosing Weight: 85.3 kg  Vital Signs: Temp: 98.2 F (36.8 C) (02/27 1726) Temp Source: Oral (02/27 1726) BP: 147/63 (02/27 1726) Pulse Rate: 93 (02/27 1726)  Labs: Recent Labs    11/28/18 0434 11/29/18 0408 11/30/18 0430 11/30/18 1826  LABPROT 30.0* 39.3* 22.5*  --   INR 2.9* 4.1* 2.0*  --   HEPARINUNFRC  --   --   --  <0.10*  CREATININE 1.64* 1.70* 1.84*  --     Estimated Creatinine Clearance: 45.7 mL/min (A) (by C-G formula based on SCr of 1.84 mg/dL (H)).   Assessment:  Anticoag: Warfarin PTA  for Afib, now hold start hep when INR < 2 -INR 2.66>> 2.9>> 4.1>2 so IV heparin started with rapid fall in INR. HL <0.1 undetectable.  Goal of Therapy:  Heparin level 0.3-0.7 units/ml Monitor platelets by anticoagulation protocol: Yes   Plan:  Increase IV heparin to 1200 units/hr.  Daily HL and CBC  Zarielle Cea S. Alford Highland, PharmD, BCPS Clinical Staff Pharmacist Eilene Ghazi Stillinger 11/30/2018,7:43 PM

## 2018-11-30 NOTE — Progress Notes (Signed)
ANTICOAGULATION CONSULT NOTE - Follow Up Consult  Pharmacy Consult for warfarin > heparin  Indication: atrial fibrillation  Allergies  Allergen Reactions  . Vancomycin Itching and Rash    Patient Measurements: Height: 6' (182.9 cm) Weight: 188 lb 0.8 oz (85.3 kg) IBW/kg (Calculated) : 77.6  Vital Signs: Temp: 98.2 F (36.8 C) (02/27 1101) Temp Source: Oral (02/27 1101) BP: 140/55 (02/27 1101) Pulse Rate: 89 (02/27 1101)  Labs: Recent Labs    11/28/18 0434 11/29/18 0408 11/30/18 0430  LABPROT 30.0* 39.3* 22.5*  INR 2.9* 4.1* 2.0*  CREATININE 1.64* 1.70* 1.84*    Estimated Creatinine Clearance: 45.7 mL/min (A) (by C-G formula based on SCr of 1.84 mg/dL (H)).   Medications:  Medications Prior to Admission  Medication Sig Dispense Refill Last Dose  . acetaminophen (TYLENOL) 500 MG tablet Take 500 mg by mouth every 6 (six) hours as needed for mild pain or moderate pain.    11/25/2018  . albuterol (PROVENTIL) (2.5 MG/3ML) 0.083% nebulizer solution Take 2.5 mg by nebulization every 6 (six) hours as needed. For shortness of breath   11/25/2018  . ferrous sulfate 325 (65 FE) MG EC tablet Take 325 mg by mouth 2 (two) times daily.   11/25/2018  . insulin glargine (LANTUS) 100 UNIT/ML injection Inject 20 Units into the skin daily as needed (for high blood sugar).    11/25/2018  . Lactobacillus (ACIDOPHOLUS PO) Take 1 tablet by mouth daily.   11/25/2018  . magnesium oxide (MAG-OX) 400 MG tablet Take 400 mg by mouth 2 (two) times daily.   11/25/2018  . metolazone (ZAROXOLYN) 2.5 MG tablet Take 2.5 mg (1 tablet) on Monday, Wednesday & Friday 30 minutes prior to taking torsemide. 30 tablet 3 11/25/2018  . Multiple Vitamin (MULTIVITAMIN) tablet Take 1 tablet by mouth daily.   11/25/2018  . pantoprazole (PROTONIX) 40 MG tablet Take 1 tablet (40 mg total) by mouth daily before breakfast. 30 tablet 12 11/25/2018  . primidone (MYSOLINE) 50 MG tablet Take 50 mg by mouth daily.    11/25/2018  .  sertraline (ZOLOFT) 50 MG tablet Take 50 mg by mouth daily.   11/25/2018  . simvastatin (ZOCOR) 40 MG tablet Take 40 mg by mouth at bedtime.    11/25/2018  . torsemide (DEMADEX) 20 MG tablet Take 2 tablets (40 mg total) by mouth 2 (two) times daily. 360 tablet 3 11/25/2018  . warfarin (COUMADIN) 4 MG tablet Take 3 tablets daily or as directed (Patient taking differently: Take 12 mg by mouth every evening. ) 260 tablet 3 11/25/2018 at 2000  . albuterol (PROVENTIL HFA;VENTOLIN HFA) 108 (90 BASE) MCG/ACT inhaler Inhale 2 puffs into the lungs every 6 (six) hours as needed. For shortness of breath   Taking  . nitroGLYCERIN (NITROSTAT) 0.4 MG SL tablet Place 1 tablet (0.4 mg total) under the tongue every 5 (five) minutes x 3 doses as needed. For chest pain 25 tablet 3  at Unknown time  . ondansetron (ZOFRAN) 4 MG tablet Take 4 mg by mouth 4 (four) times daily as needed for nausea or vomiting.   Taking   Scheduled:  . ferrous sulfate  325 mg Oral BID  . hydrALAZINE  37.5 mg Oral Q8H  . insulin aspart  0-20 Units Subcutaneous TID WC  . insulin aspart  0-5 Units Subcutaneous QHS  . insulin aspart  4 Units Subcutaneous TID WC  . insulin glargine  12 Units Subcutaneous Daily  . isosorbide mononitrate  30 mg Oral Daily  .  magnesium oxide  400 mg Oral BID  . mouth rinse  15 mL Mouth Rinse BID  . pantoprazole  40 mg Oral QAC breakfast  . primidone  50 mg Oral Daily  . sertraline  50 mg Oral Daily  . simvastatin  40 mg Oral QHS  . sodium chloride flush  10-40 mL Intracatheter Q12H   Infusions:  . furosemide (LASIX) infusion 12 mg/hr (11/30/18 0800)  . heparin    . milrinone 0.25 mcg/kg/min (11/30/18 0800)   PRN: acetaminophen **OR** acetaminophen, albuterol, nitroGLYCERIN, sodium chloride flush, sodium chloride flush Anti-infectives (From admission, onward)   None      Assessment: 63 year old male on warfarin PTA for atrial fibrillation. INR is supratherapeutic at 4.1 which has trended up on home  dose. Now plan to hold warfarin for possible procedures and begin heparin when INR < 2  INR 2 today will start heparin drip today fplan for R/L HC soon Home dose of 8 mg on Wed, 12 mg ROW.  Goal of Therapy:  HL 0.3-0.7 INR 2-3 Monitor platelets by anticoagulation protocol: Yes   Plan:  Hold warfarin Daily PT-INR Heparin drip 900 uts/hr  Heparin level in 6hr  Monitor for signs and symptoms of bleeding.   Bonnita Nasuti Pharm.D. CPP, BCPS Clinical Pharmacist [phone] 11/30/2018 2:18 PM

## 2018-11-30 NOTE — Plan of Care (Signed)
  Problem: Education: Goal: Knowledge of General Education information will improve Description Including pain rating scale, medication(s)/side effects and non-pharmacologic comfort measures Outcome: Progressing   Problem: Clinical Measurements: Goal: Ability to maintain clinical measurements within normal limits will improve Outcome: Progressing Goal: Respiratory complications will improve Outcome: Progressing   Problem: Activity: Goal: Risk for activity intolerance will decrease Outcome: Progressing   Problem: Safety: Goal: Ability to remain free from injury will improve Outcome: Progressing   Problem: Activity: Goal: Capacity to carry out activities will improve Outcome: Progressing

## 2018-12-01 ENCOUNTER — Inpatient Hospital Stay (HOSPITAL_COMMUNITY): Payer: Medicare HMO

## 2018-12-01 LAB — PULMONARY FUNCTION TEST
DL/VA % pred: 86 %
DL/VA: 3.61 ml/min/mmHg/L
DLCO cor % pred: 41 %
DLCO cor: 12.07 ml/min/mmHg
DLCO unc % pred: 33 %
DLCO unc: 9.72 ml/min/mmHg
FEF 25-75 Post: 1.53 L/sec
FEF 25-75 Pre: 1.08 L/sec
FEF2575-%Change-Post: 42 %
FEF2575-%Pred-Post: 50 %
FEF2575-%Pred-Pre: 35 %
FEV1-%CHANGE-POST: 8 %
FEV1-%PRED-PRE: 35 %
FEV1-%Pred-Post: 38 %
FEV1-Post: 1.45 L
FEV1-Pre: 1.33 L
FEV1FVC-%Change-Post: 1 %
FEV1FVC-%Pred-Pre: 101 %
FEV6-%Change-Post: 7 %
FEV6-%PRED-PRE: 36 %
FEV6-%Pred-Post: 38 %
FEV6-PRE: 1.75 L
FEV6-Post: 1.87 L
FEV6FVC-%Pred-Post: 104 %
FEV6FVC-%Pred-Pre: 104 %
FVC-%Change-Post: 7 %
FVC-%Pred-Post: 37 %
FVC-%Pred-Pre: 34 %
FVC-Post: 1.87 L
FVC-Pre: 1.75 L
Post FEV1/FVC ratio: 78 %
Post FEV6/FVC ratio: 100 %
Pre FEV1/FVC ratio: 76 %
Pre FEV6/FVC Ratio: 100 %
RV % pred: 127 %
RV: 3.05 L
TLC % pred: 65 %
TLC: 4.82 L

## 2018-12-01 LAB — LIPID PANEL
Cholesterol: 115 mg/dL (ref 0–200)
HDL: 59 mg/dL (ref >40)
LDL Cholesterol: 36 mg/dL (ref 0–99)
Total CHOL/HDL Ratio: 1.9 RATIO
Triglycerides: 100 mg/dL (ref <150)
VLDL: 20 mg/dL (ref 0–40)

## 2018-12-01 LAB — CBC WITH DIFFERENTIAL/PLATELET
ABS IMMATURE GRANULOCYTES: 0.01 10*3/uL (ref 0.00–0.07)
Basophils Absolute: 0 10*3/uL (ref 0.0–0.1)
Basophils Relative: 1 %
Eosinophils Absolute: 0.2 10*3/uL (ref 0.0–0.5)
Eosinophils Relative: 4 %
HCT: 28.5 % — ABNORMAL LOW (ref 39.0–52.0)
HEMOGLOBIN: 8.5 g/dL — AB (ref 13.0–17.0)
Immature Granulocytes: 0 %
Lymphocytes Relative: 12 %
Lymphs Abs: 0.6 10*3/uL — ABNORMAL LOW (ref 0.7–4.0)
MCH: 29 pg (ref 26.0–34.0)
MCHC: 29.8 g/dL — AB (ref 30.0–36.0)
MCV: 97.3 fL (ref 80.0–100.0)
Monocytes Absolute: 0.4 10*3/uL (ref 0.1–1.0)
Monocytes Relative: 9 %
NEUTROS ABS: 3.7 10*3/uL (ref 1.7–7.7)
NEUTROS PCT: 74 %
Platelets: 203 10*3/uL (ref 150–400)
RBC: 2.93 MIL/uL — ABNORMAL LOW (ref 4.22–5.81)
RDW: 14 % (ref 11.5–15.5)
WBC: 5 10*3/uL (ref 4.0–10.5)
nRBC: 0 % (ref 0.0–0.2)

## 2018-12-01 LAB — GLUCOSE, CAPILLARY
Glucose-Capillary: 127 mg/dL — ABNORMAL HIGH (ref 70–99)
Glucose-Capillary: 165 mg/dL — ABNORMAL HIGH (ref 70–99)
Glucose-Capillary: 220 mg/dL — ABNORMAL HIGH (ref 70–99)
Glucose-Capillary: 262 mg/dL — ABNORMAL HIGH (ref 70–99)
Glucose-Capillary: 318 mg/dL — ABNORMAL HIGH (ref 70–99)

## 2018-12-01 LAB — BASIC METABOLIC PANEL
Anion gap: 10 (ref 5–15)
BUN: 46 mg/dL — ABNORMAL HIGH (ref 8–23)
CO2: 34 mmol/L — ABNORMAL HIGH (ref 22–32)
Calcium: 8.5 mg/dL — ABNORMAL LOW (ref 8.9–10.3)
Chloride: 89 mmol/L — ABNORMAL LOW (ref 98–111)
Creatinine, Ser: 1.65 mg/dL — ABNORMAL HIGH (ref 0.61–1.24)
GFR calc Af Amer: 51 mL/min — ABNORMAL LOW (ref >60)
GFR calc non Af Amer: 44 mL/min — ABNORMAL LOW (ref >60)
Glucose, Bld: 203 mg/dL — ABNORMAL HIGH (ref 70–99)
Potassium: 3.8 mmol/L (ref 3.5–5.1)
Sodium: 133 mmol/L — ABNORMAL LOW (ref 135–145)

## 2018-12-01 LAB — PROTIME-INR
INR: 1.5 — AB (ref 0.8–1.2)
Prothrombin Time: 18.2 seconds — ABNORMAL HIGH (ref 11.4–15.2)

## 2018-12-01 LAB — BLOOD GAS, ARTERIAL
Acid-Base Excess: 10 mmol/L — ABNORMAL HIGH (ref 0.0–2.0)
Bicarbonate: 35.2 mmol/L — ABNORMAL HIGH (ref 20.0–28.0)
Drawn by: 275531
O2 Content: 2 L/min
O2 Saturation: 94.8 %
PCO2 ART: 58.2 mmHg — AB (ref 32.0–48.0)
Patient temperature: 98.6
pH, Arterial: 7.398 (ref 7.350–7.450)
pO2, Arterial: 72.5 mmHg — ABNORMAL LOW (ref 83.0–108.0)

## 2018-12-01 LAB — APTT: aPTT: 41 seconds — ABNORMAL HIGH (ref 24–36)

## 2018-12-01 LAB — COOXEMETRY PANEL
Carboxyhemoglobin: 2.6 % — ABNORMAL HIGH (ref 0.5–1.5)
Methemoglobin: 1 % (ref 0.0–1.5)
O2 Saturation: 73.5 %
TOTAL HEMOGLOBIN: 8.4 g/dL — AB (ref 12.0–16.0)

## 2018-12-01 LAB — HEMOGLOBIN A1C
Hgb A1c MFr Bld: 7.7 % — ABNORMAL HIGH (ref 4.8–5.6)
Mean Plasma Glucose: 174.29 mg/dL

## 2018-12-01 LAB — ANTITHROMBIN III: AntiThromb III Func: 97 % (ref 75–120)

## 2018-12-01 LAB — HEPARIN LEVEL (UNFRACTIONATED)
Heparin Unfractionated: 0.16 IU/mL — ABNORMAL LOW (ref 0.30–0.70)
Heparin Unfractionated: 0.16 IU/mL — ABNORMAL LOW (ref 0.30–0.70)

## 2018-12-01 LAB — PREALBUMIN: Prealbumin: 16.5 mg/dL — ABNORMAL LOW (ref 18–38)

## 2018-12-01 LAB — T4, FREE: Free T4: 1.05 ng/dL (ref 0.82–1.77)

## 2018-12-01 LAB — URIC ACID: Uric Acid, Serum: 11 mg/dL — ABNORMAL HIGH (ref 3.7–8.6)

## 2018-12-01 LAB — LACTATE DEHYDROGENASE: LDH: 150 U/L (ref 98–192)

## 2018-12-01 LAB — PSA: PROSTATIC SPECIFIC ANTIGEN: 0.31 ng/mL (ref 0.00–4.00)

## 2018-12-01 MED ORDER — SPIRONOLACTONE 12.5 MG HALF TABLET
12.5000 mg | ORAL_TABLET | Freq: Every day | ORAL | Status: DC
  Administered 2018-12-01 – 2018-12-03 (×3): 12.5 mg via ORAL
  Filled 2018-12-01 (×3): qty 1

## 2018-12-01 MED ORDER — ISOSORBIDE MONONITRATE ER 60 MG PO TB24
60.0000 mg | ORAL_TABLET | Freq: Every day | ORAL | Status: DC
  Administered 2018-12-01 – 2018-12-08 (×8): 60 mg via ORAL
  Filled 2018-12-01 (×8): qty 1

## 2018-12-01 MED ORDER — ALBUTEROL SULFATE (2.5 MG/3ML) 0.083% IN NEBU
2.5000 mg | INHALATION_SOLUTION | Freq: Once | RESPIRATORY_TRACT | Status: AC
  Administered 2018-12-01: 2.5 mg via RESPIRATORY_TRACT

## 2018-12-01 MED ORDER — TORSEMIDE 20 MG PO TABS
60.0000 mg | ORAL_TABLET | Freq: Two times a day (BID) | ORAL | Status: DC
  Administered 2018-12-01 – 2018-12-03 (×5): 60 mg via ORAL
  Filled 2018-12-01 (×5): qty 3

## 2018-12-01 MED ORDER — HYDRALAZINE HCL 50 MG PO TABS
50.0000 mg | ORAL_TABLET | Freq: Three times a day (TID) | ORAL | Status: DC
  Administered 2018-12-01 – 2018-12-07 (×18): 50 mg via ORAL
  Filled 2018-12-01 (×18): qty 1

## 2018-12-01 NOTE — Care Management Note (Signed)
Case Management Note Previous Note Created by [facility]  Patient Details  Name: MARQUISE WICKE MRN: [id number] Date of Birth: 01-Mar-1956  Subjective/Objective:     Admitted with CHF. Pt from home, ind with ADL's. Pt has insurance and PCP. Admission last month for same. Refused Home health at that time. Discussed home health RN with patient for disease management, he is agreeable. Discussed [place] [facility] program [facility], however patient will not be eligible as he has a pacemaker. Will still order [facility] RN at time of DC.  Patient reports he has a home oxygen concentrator at home, unsure of agency, reports he does not utilize it often.           Has PCP. Will have cardiology follow up. Has transportation.  Action/Plan: DC home with home health RN. Will refer to [facility].  Expected Discharge Date:   unk               Expected Discharge Plan:  [place]  In-House Referral:     Discharge planning Services  CM Consult  Post Acute Care Choice:  Home Health Choice offered to:  Patient  DME Arranged:    DME Agency:     HH Arranged:  RN, Disease Management [place] Agency:  [facility]  Status of Service:  Completed, signed off  If discussed at [place] of Stay Meetings, dates discussed:    Additional Comments: 12/01/2018 Pt transferred to [facility].  Pt remains on milrinone drip and IV heparin.  Plans if for Cath on 3/2 and potential LVAD workup. Maryclare Labrador, RN 12/01/2018, 4:33 PM

## 2018-12-01 NOTE — Progress Notes (Signed)
ANTICOAGULATION CONSULT NOTE - Follow Up Consult  Pharmacy Consult for Heparin Indication: atrial fibrillation  Allergies  Allergen Reactions  . Vancomycin Itching and Rash    Patient Measurements: Height: 6' (182.9 cm) Weight: 188 lb 0.8 oz (85.3 kg) IBW/kg (Calculated) : 77.6 Heparin Dosing Weight: 85.3 kg  Vital Signs: Temp: 98.6 F (37 C) (02/28 0008) Temp Source: Oral (02/28 0008) BP: 121/66 (02/28 0008) Pulse Rate: 68 (02/28 0008)  Labs: Recent Labs    11/29/18 0408 11/30/18 0430 11/30/18 1826 12/01/18 0407  LABPROT 39.3* 22.5*  --  18.2*  INR 4.1* 2.0*  --  1.5*  HEPARINUNFRC  --   --  <0.10* 0.16*  CREATININE 1.70* 1.84*  --  1.65*    Estimated Creatinine Clearance: 50.9 mL/min (A) (by C-G formula based on SCr of 1.65 mg/dL (H)).   Assessment:  Anticoag: Warfarin PTA  for Afib, now hold start hep when INR < 2 -INR 2.66>> 2.9>> 4.1>2 so IV heparin started with rapid fall in INR. HL <0.1 undetectable.  2/28 AM update: heparin level remains sub-therapeutic but trending up, no issues per RN, INR 1.5  Goal of Therapy:  Heparin level 0.3-0.7 units/ml Monitor platelets by anticoagulation protocol: Yes   Plan:  Inc heparin to 1350 units/hr Re-check heparin level in 6-8 hours  Narda Bonds, PharmD, [place] Pharmacist Phone: [phone]

## 2018-12-01 NOTE — Progress Notes (Signed)
CARDIAC REHAB PHASE I   PRE:  Rate/Rhythm: 89 pacing    BP: sitting 148/71    SaO2: 98 3L  MODE:  Ambulation: 410 ft   POST:  Rate/Rhythm: 92 pacing    BP: sitting 138/61     SaO2: 93-95 3L  Pt able to walk steady with light gait belt. No major c/o, able to increase distance without rest. Used x2 for equipment but can be x1. Will f/u . [place], ACSM 12/01/2018 2:04 PM

## 2018-12-01 NOTE — Progress Notes (Addendum)
Advanced Heart Failure Rounding Note  PCP-Cardiologist: [name] Dolly, MD   Subjective:    Coox 73.5% on milrinone 0.25 mcg/kg/min. Weight shows down 8 lbs on lasix gtt. Negative 2.7 L.   CVP 7-8 on my check.   Feeling much better this am.   INR 1.5. Cr 1.7 -> 1.84 -> 1.65  Objective:   Weight Range: 82 kg Body mass index is 24.52 kg/m.   Vital Signs:   Temp:  [98.2 F (36.8 C)-98.6 F (37 C)] 98.6 F (37 C) (02/28 0008) Pulse Rate:  [68-102] 68 (02/28 0008) Resp:  [19-29] 22 (02/28 0008) BP: (121-167)/(55-76) 167/76 (02/28 0522) SpO2:  [95 %-100 %] 96 % (02/28 0008) Weight:  [82 kg] 82 kg (02/28 0500) Last BM Date: 11/30/18  Weight change: Filed Weights   11/29/18 1600 11/30/18 0620 12/01/18 0500  Weight: 86 kg 85.3 kg 82 kg    Intake/Output:   Intake/Output Summary (Last 24 hours) at 12/01/2018 0742 Last data filed at 12/01/2018 0520 Gross per 24 hour  Intake 1249.57 ml  Output 4001 ml  Net -2751.43 ml     Physical Exam    General:NAD at this time.  HEENT: Normal Neck: Supple. JVP 7-8 cm. Carotids 2+ bilat; no bruits. No thyromegaly or nodule noted. Cor: PMI lateral. Regular, tachy. 2/6 MR, ? S3.  Lungs: Diminished throughout.  Abdomen: Soft, non-tender, non-distended, no HSM. No bruits or masses. +BS  Extremities: No cyanosis, clubbing, or rash. 1-2+ edema into thighs.  Neuro: Alert & orientedx3, cranial nerves grossly intact. moves all 4 extremities w/o difficulty. Affect flat but appropriate.   Telemetry   V paced 80s, personally reviewed.   EKG    No new tracings.    Labs    CBC No results for input(s): WBC, NEUTROABS, HGB, HCT, MCV, PLT in the last 72 hours. Basic Metabolic Panel Recent Labs    11/30/18 0430 12/01/18 0407  NA 132* 133*  K 4.5 3.8  CL 90* 89*  CO2 33* 34*  GLUCOSE 322* 203*  BUN 43* 46*  CREATININE 1.84* 1.65*  CALCIUM 8.6* 8.5*  MG 2.0  --    Liver Function Tests No results for input(s): AST, ALT,  ALKPHOS, BILITOT, PROT, ALBUMIN in the last 72 hours. No results for input(s): LIPASE, AMYLASE in the last 72 hours. Cardiac Enzymes No results for input(s): CKTOTAL, CKMB, CKMBINDEX, TROPONINI in the last 72 hours.  BNP: BNP (last 3 results) Recent Labs    09/04/18 1517 10/16/18 1536 11/26/18 1645  BNP 605.0* 2,147.0* 1,629.0*   ProBNP (last 3 results) No results for input(s): PROBNP in the last 8760 hours.  D-Dimer No results for input(s): DDIMER in the last 72 hours. Hemoglobin A1C No results for input(s): HGBA1C in the last 72 hours. Fasting Lipid Panel No results for input(s): CHOL, HDL, LDLCALC, TRIG, CHOLHDL, LDLDIRECT in the last 72 hours. Thyroid Function Tests No results for input(s): TSH, T4TOTAL, T3FREE, THYROIDAB in the last 72 hours.  Invalid input(s): FREET3  Other results:  Imaging   No results found.  Medications:    Scheduled Medications: . ferrous sulfate  325 mg Oral BID  . hydrALAZINE  37.5 mg Oral Q8H  . insulin aspart  0-20 Units Subcutaneous TID WC  . insulin aspart  0-5 Units Subcutaneous QHS  . insulin aspart  4 Units Subcutaneous TID WC  . insulin glargine  12 Units Subcutaneous Daily  . isosorbide mononitrate  30 mg Oral Daily  . magnesium oxide  400 mg Oral BID  . mouth rinse  15 mL Mouth Rinse BID  . pantoprazole  40 mg Oral QAC breakfast  . primidone  50 mg Oral Daily  . sertraline  50 mg Oral Daily  . simvastatin  40 mg Oral QHS  . sodium chloride flush  10-40 mL Intracatheter Q12H    Infusions: . furosemide (LASIX) infusion 12 mg/hr (12/01/18 0400)  . heparin 1,350 Units/hr (12/01/18 0505)  . milrinone 0.25 mcg/kg/min (12/01/18 0400)    PRN Medications: acetaminophen **OR** acetaminophen, albuterol, nitroGLYCERIN, sodium chloride flush, sodium chloride flush  Patient Profile   [name] is a 63 y.o. male  with h/o Paroxysmal Afib, CAD, OA of both hands, chronic combined systolic/diastolic CHF, CHB s/p [employer] BiV  ICD, Carpal Tunnel syndrome, h/o Colon CA, COPD, DM2, peripheral neuropathy, ETOH abuse, GERD, HLD, HTN, and h/o tobacco abuse.   Admitted 11/26/2018 with worsening DOE and edema despite adjustment of outpatient diuretics. Moved to [facility] 11/29/18 with low output by PICC line.   Assessment/Plan   1. Acute on chronic systolic CHF: Ischemic cardiomyopathy. [employer] CRT-D device.  Most recent echo in 12/19 with EF 20-25%, moderately dilated RV with mildly decreased systolic function, mild AS, moderate MR, moderate TR, PASP 50 mmHg.  PICC in place, co-ox suggested low output at 49%.  He remains markedly volume overloaded. Breathing improved.  Coox 73.5% on milrinone 0.25 mcg/kg/min.  - Stop lasix gtt with CVP 7-8. Start torsemide 60 mg BID this evening.  - Increase hydralazine to 50 mg TID and Imdur to 60 mg daily with elevated BP.  - He will need RHC when better diuresed. Plan [facility] Monday.  - Suspect that he is nearing the need for evaluation for advanced therapies.  He lives with his wife, would need to see how bad his COPD is and how his kidney function settles out.  2. CAD: Anterior MI in 2006 with PCI to LAD.  DES for in-stent restenosis in 2007.  No cath/intervention since that time.  - No s/s of ischemia.    - Given warfarin anticoagulation, he is not on ASA.  - Continue Zocor.  - Will evaluate with coronary angiography when diuresed and creatinine stabilized => Monday.  3. AKI on CKD stage 3: - Creatinine up with attempts at diuresis. - Cr 1.7 -> 1.84 -> 1.65. Suspect cardiorenal syndrome.   4. COPD: He recently quit smoking.  Not sure how severe his COPD is.  - Will need PFTs when he is better diuresed.  - Stop prednisone. Suspect primarily CHF.  5. Type II diabetes:  - SSI while in house. Should improve off prednisone.  6. Atrial fibrillation: Paroxysmal.  - Appears to be in NSR - Holding coumadin to allow INR to trend down for cath. INR 1.5. Covering with heparin.    Medication concerns reviewed with patient and pharmacy team. Barriers identified: None at this time.   Length of Stay: [address]  [name]  12/01/2018, 7:42 AM  Advanced Heart Failure Team Pager [phone] (M-F; 7a - 4p)  Please contact [name] for night-coverage after hours (4p -7a ) and weekends on [email or web address]  Patient seen with PA, agree with the above note.   Co-ox 74% today, he diuresed well with CVP down to 7. Breathing better.    On exam, JVP not elevated.  Regular S1S2, no S3.  Lower legs wrapped.   CVP 7-8 today with co-ox 74% on milrinone 0.25.  He diuresed well  again on current regimen.  Volume status much better and creatinine down to 1.6.  - Continue milrinone 0.25 - Stop Lasix, start torsemide 60 mg bid this evening.   - Gradually titrate up hydralazine/Imdur, can eventually transition to losartan/Entresto.  - Start spironolactone 12.5 daily.  - Plan for RHC/LHC on Monday.   - Suspect we will need to start thinking about advanced therapies.  Will get PFTs to see how bad his COPD is and ask LVAD nurses to talk with him today.   Walk in halls.   [name] 12/01/2018 8:11 AM

## 2018-12-01 NOTE — Progress Notes (Signed)
ANTICOAGULATION CONSULT NOTE - Follow Up Consult  Pharmacy Consult for warfarin > heparin  Indication: atrial fibrillation  Allergies  Allergen Reactions  . Vancomycin Itching and Rash    Patient Measurements: Height: 6' (182.9 cm) Weight: 180 lb 12.4 oz (82 kg) IBW/kg (Calculated) : 77.6  Vital Signs: Temp: 98 F (36.7 C) (02/28 1537) Temp Source: Oral (02/28 1537) BP: 144/66 (02/28 1537) Pulse Rate: 81 (02/28 1537)  Labs: Recent Labs    11/29/18 0408 11/30/18 0430 11/30/18 1826 12/01/18 0407 12/01/18 1606 12/01/18 1612  HGB  --   --   --   --  8.5*  --   HCT  --   --   --   --  28.5*  --   PLT  --   --   --   --  203  --   LABPROT 39.3* 22.5*  --  18.2*  --   --   INR 4.1* 2.0*  --  1.5*  --   --   HEPARINUNFRC  --   --  <0.10* 0.16*  --  0.16*  CREATININE 1.70* 1.84*  --  1.65*  --   --     Estimated Creatinine Clearance: 50.9 mL/min (A) (by C-G formula based on SCr of 1.65 mg/dL (H)).   Medications:  Medications Prior to Admission  Medication Sig Dispense Refill Last Dose  . acetaminophen (TYLENOL) 500 MG tablet Take 500 mg by mouth every 6 (six) hours as needed for mild pain or moderate pain.    11/25/2018  . albuterol (PROVENTIL) (2.5 MG/3ML) 0.083% nebulizer solution Take 2.5 mg by nebulization every 6 (six) hours as needed. For shortness of breath   11/25/2018  . ferrous sulfate 325 (65 FE) MG EC tablet Take 325 mg by mouth 2 (two) times daily.   11/25/2018  . insulin glargine (LANTUS) 100 UNIT/ML injection Inject 20 Units into the skin daily as needed (for high blood sugar).    11/25/2018  . Lactobacillus (ACIDOPHOLUS PO) Take 1 tablet by mouth daily.   11/25/2018  . magnesium oxide (MAG-OX) 400 MG tablet Take 400 mg by mouth 2 (two) times daily.   11/25/2018  . metolazone (ZAROXOLYN) 2.5 MG tablet Take 2.5 mg (1 tablet) on Monday, Wednesday & Friday 30 minutes prior to taking torsemide. 30 tablet 3 11/25/2018  . Multiple Vitamin (MULTIVITAMIN) tablet Take 1  tablet by mouth daily.   11/25/2018  . pantoprazole (PROTONIX) 40 MG tablet Take 1 tablet (40 mg total) by mouth daily before breakfast. 30 tablet 12 11/25/2018  . primidone (MYSOLINE) 50 MG tablet Take 50 mg by mouth daily.    11/25/2018  . sertraline (ZOLOFT) 50 MG tablet Take 50 mg by mouth daily.   11/25/2018  . simvastatin (ZOCOR) 40 MG tablet Take 40 mg by mouth at bedtime.    11/25/2018  . torsemide (DEMADEX) 20 MG tablet Take 2 tablets (40 mg total) by mouth 2 (two) times daily. 360 tablet 3 11/25/2018  . warfarin (COUMADIN) 4 MG tablet Take 3 tablets daily or as directed (Patient taking differently: Take 12 mg by mouth every evening. ) 260 tablet 3 11/25/2018 at 2000  . albuterol (PROVENTIL HFA;VENTOLIN HFA) 108 (90 BASE) MCG/ACT inhaler Inhale 2 puffs into the lungs every 6 (six) hours as needed. For shortness of breath   Taking  . nitroGLYCERIN (NITROSTAT) 0.4 MG SL tablet Place 1 tablet (0.4 mg total) under the tongue every 5 (five) minutes x 3 doses as needed. For chest  pain 25 tablet 3  at Unknown time  . ondansetron (ZOFRAN) 4 MG tablet Take 4 mg by mouth 4 (four) times daily as needed for nausea or vomiting.   Taking   Scheduled:  . ferrous sulfate  325 mg Oral BID  . hydrALAZINE  50 mg Oral Q8H  . insulin aspart  0-20 Units Subcutaneous TID WC  . insulin aspart  0-5 Units Subcutaneous QHS  . insulin aspart  4 Units Subcutaneous TID WC  . insulin glargine  12 Units Subcutaneous Daily  . isosorbide mononitrate  60 mg Oral Daily  . magnesium oxide  400 mg Oral BID  . mouth rinse  15 mL Mouth Rinse BID  . pantoprazole  40 mg Oral QAC breakfast  . primidone  50 mg Oral Daily  . sertraline  50 mg Oral Daily  . simvastatin  40 mg Oral QHS  . sodium chloride flush  10-40 mL Intracatheter Q12H  . spironolactone  12.5 mg Oral Daily  . torsemide  60 mg Oral BID   Infusions:  . heparin 1,350 Units/hr (12/01/18 1225)  . milrinone 0.25 mcg/kg/min (12/01/18 1227)   PRN: acetaminophen  **OR** acetaminophen, albuterol, nitroGLYCERIN, sodium chloride flush, sodium chloride flush Anti-infectives (From admission, onward)   None      Assessment: 63 year old male on warfarin PTA for atrial fibrillation. INR is supratherapeutic at 4.1 which has trended up on home dose. Now plan to hold warfarin for possible procedures and begin heparin when INR < 2.  INR 1.5 this morning. Heparin level this afternoon came back at 0.16, on 1350 units/hr. Hgb 8.5, plt 203. No s/sx of bleeding- red spots/bruising noted on arm by nursing. No infusion issues. Heparin level collected from red port of midline (infusion running into white port of midline).   Home dose of 8 mg on Wed, 12 mg ROW.  Goal of Therapy:  HL 0.3-0.7 INR 2-3 Monitor platelets by anticoagulation protocol: Yes   Plan:  Hold warfarin Daily PT-INR Increase heparin infusion to 1500 units/hr Monitor heparin level with morning labs Monitor for signs and symptoms of bleeding.   Antonietta Jewel, PharmD, [place] Clinical Pharmacist  Pager: [phone] Phone: [phone] 12/01/2018 5:24 PM

## 2018-12-02 ENCOUNTER — Inpatient Hospital Stay (HOSPITAL_COMMUNITY): Payer: Medicare HMO

## 2018-12-02 ENCOUNTER — Inpatient Hospital Stay (HOSPITAL_COMMUNITY): Payer: Self-pay

## 2018-12-02 DIAGNOSIS — I5043 Acute on chronic combined systolic (congestive) and diastolic (congestive) heart failure: Secondary | ICD-10-CM

## 2018-12-02 DIAGNOSIS — I340 Nonrheumatic mitral (valve) insufficiency: Secondary | ICD-10-CM

## 2018-12-02 DIAGNOSIS — I429 Cardiomyopathy, unspecified: Secondary | ICD-10-CM

## 2018-12-02 LAB — CBC WITH DIFFERENTIAL/PLATELET
Abs Immature Granulocytes: 0.01 10*3/uL (ref 0.00–0.07)
Basophils Absolute: 0 10*3/uL (ref 0.0–0.1)
Basophils Relative: 1 %
Eosinophils Absolute: 0.2 10*3/uL (ref 0.0–0.5)
Eosinophils Relative: 3 %
HEMATOCRIT: 26.9 % — AB (ref 39.0–52.0)
Hemoglobin: 8.2 g/dL — ABNORMAL LOW (ref 13.0–17.0)
Immature Granulocytes: 0 %
LYMPHS ABS: 0.6 10*3/uL — AB (ref 0.7–4.0)
LYMPHS PCT: 14 %
MCH: 29.5 pg (ref 26.0–34.0)
MCHC: 30.5 g/dL (ref 30.0–36.0)
MCV: 96.8 fL (ref 80.0–100.0)
Monocytes Absolute: 0.4 10*3/uL (ref 0.1–1.0)
Monocytes Relative: 9 %
Neutro Abs: 3.3 10*3/uL (ref 1.7–7.7)
Neutrophils Relative %: 73 %
Platelets: 177 10*3/uL (ref 150–400)
RBC: 2.78 MIL/uL — ABNORMAL LOW (ref 4.22–5.81)
RDW: 13.9 % (ref 11.5–15.5)
WBC: 4.6 10*3/uL (ref 4.0–10.5)
nRBC: 0 % (ref 0.0–0.2)

## 2018-12-02 LAB — HEPARIN LEVEL (UNFRACTIONATED)
Heparin Unfractionated: 0.17 IU/mL — ABNORMAL LOW (ref 0.30–0.70)
Heparin Unfractionated: 0.28 IU/mL — ABNORMAL LOW (ref 0.30–0.70)
Heparin Unfractionated: 0.47 IU/mL (ref 0.30–0.70)

## 2018-12-02 LAB — BASIC METABOLIC PANEL
Anion gap: 8 (ref 5–15)
BUN: 43 mg/dL — ABNORMAL HIGH (ref 8–23)
CO2: 34 mmol/L — ABNORMAL HIGH (ref 22–32)
Calcium: 8.5 mg/dL — ABNORMAL LOW (ref 8.9–10.3)
Chloride: 91 mmol/L — ABNORMAL LOW (ref 98–111)
Creatinine, Ser: 1.71 mg/dL — ABNORMAL HIGH (ref 0.61–1.24)
GFR calc Af Amer: 49 mL/min — ABNORMAL LOW (ref >60)
GFR, EST NON AFRICAN AMERICAN: 42 mL/min — AB (ref >60)
Glucose, Bld: 293 mg/dL — ABNORMAL HIGH (ref 70–99)
Potassium: 4.1 mmol/L (ref 3.5–5.1)
Sodium: 133 mmol/L — ABNORMAL LOW (ref 135–145)

## 2018-12-02 LAB — ECHOCARDIOGRAM COMPLETE
Height: 72 in
Weight: 2871.27 oz

## 2018-12-02 LAB — GLUCOSE, CAPILLARY
Glucose-Capillary: 337 mg/dL — ABNORMAL HIGH (ref 70–99)
Glucose-Capillary: 255 mg/dL — ABNORMAL HIGH (ref 70–99)
Glucose-Capillary: 130 mg/dL — ABNORMAL HIGH (ref 70–99)
Glucose-Capillary: 186 mg/dL — ABNORMAL HIGH (ref 70–99)

## 2018-12-02 LAB — COOXEMETRY PANEL
Carboxyhemoglobin: 2 % — ABNORMAL HIGH (ref 0.5–1.5)
Methemoglobin: 1.7 % — ABNORMAL HIGH (ref 0.0–1.5)
O2 Saturation: 70 %
Total hemoglobin: 8.8 g/dL — ABNORMAL LOW (ref 12.0–16.0)

## 2018-12-02 LAB — HEPATITIS B SURFACE ANTIBODY, QUANTITATIVE: Hep B S AB Quant (Post): <3.1 m[IU]/mL — ABNORMAL LOW (ref >9.9)

## 2018-12-02 LAB — PROTIME-INR
INR: 1.3 — AB (ref 0.8–1.2)
Prothrombin Time: 15.9 seconds — ABNORMAL HIGH (ref 11.4–15.2)

## 2018-12-02 LAB — ABO/RH: ABO/RH(D): O POS

## 2018-12-02 MED ORDER — LEVALBUTEROL HCL 1.25 MG/0.5ML IN NEBU: 1.2500 mg | INHALATION_SOLUTION | Freq: Four times a day (QID) | RESPIRATORY_TRACT | Status: DC

## 2018-12-02 MED ORDER — LEVALBUTEROL HCL 1.25 MG/0.5ML IN NEBU
1.2500 mg | INHALATION_SOLUTION | Freq: Three times a day (TID) | RESPIRATORY_TRACT | Status: DC
  Administered 2018-12-02 – 2018-12-08 (×20): 1.25 mg via RESPIRATORY_TRACT
  Filled 2018-12-02 (×20): qty 0.5

## 2018-12-02 MED ORDER — MOMETASONE FURO-FORMOTEROL FUM 200-5 MCG/ACT IN AERO
2.0000 | INHALATION_SPRAY | Freq: Two times a day (BID) | RESPIRATORY_TRACT | Status: DC
  Administered 2018-12-02 – 2018-12-04 (×5): 2 via RESPIRATORY_TRACT
  Filled 2018-12-02: qty 8.8

## 2018-12-02 MED ORDER — ENSURE ENLIVE PO LIQD
237.0000 mL | Freq: Two times a day (BID) | ORAL | Status: DC
  Administered 2018-12-02 – 2018-12-08 (×12): 237 mL via ORAL

## 2018-12-02 NOTE — Progress Notes (Signed)
  Echocardiogram 2D Echocardiogram has been performed.  [name] 12/02/2018, 11:49 AM

## 2018-12-02 NOTE — Progress Notes (Signed)
ANTICOAGULATION CONSULT NOTE - Follow Up Consult  Pharmacy Consult for Heparin Indication: atrial fibrillation  Allergies  Allergen Reactions  . Vancomycin Itching and Rash   Patient Measurements: Height: 6' (182.9 cm) Weight: 179 lb 7.3 oz (81.4 kg) IBW/kg (Calculated) : 77.6 Heparin Dosing Weight: 85.3 kg  Vital Signs: Temp: 98 F (36.7 C) (02/29 0323) Temp Source: Oral (02/29 0323) BP: 131/65 (02/29 0323) Pulse Rate: 83 (02/29 0323)  Labs: Recent Labs    11/30/18 0430  12/01/18 0407 12/01/18 1606 12/01/18 1612 12/02/18 0515 12/02/18 0516  HGB  --   --   --  8.5*  --  8.2*  --   HCT  --   --   --  28.5*  --  26.9*  --   PLT  --   --   --  203  --  177  --   APTT  --   --   --  41*  --   --   --   LABPROT 22.5*  --  18.2*  --   --  15.9*  --   INR 2.0*  --  1.5*  --   --  1.3*  --   HEPARINUNFRC  --    < > 0.16*  --  0.16*  --  0.17*  CREATININE 1.84*  --  1.65*  --   --  1.71*  --    < > = values in this interval not displayed.    Estimated Creatinine Clearance: 49.2 mL/min (A) (by C-G formula based on SCr of 1.71 mg/dL (H)).   Assessment:  Anticoag: Warfarin PTA  for Afib, now hold start hep when INR < 2 -INR 2.66>> 2.9>> 4.1>2 so IV heparin started with rapid fall in INR. HL <0.1 undetectable.  2/29 AM update: heparin level remains sub-therapeutic   Goal of Therapy:  Heparin level 0.3-0.7 units/ml Monitor platelets by anticoagulation protocol: Yes   Plan:  Inc heparin to 1700 units/hr Re-check heparin level in 6-8 hours  Narda Bonds, PharmD, [place] Pharmacist Phone: [phone]

## 2018-12-02 NOTE — Progress Notes (Signed)
Advanced Heart Failure Rounding Note  PCP-Cardiologist: Carlyle Dolly, MD   Subjective:    Remains on milrinone 0.25 mcg/kg/min. Co-ox 70%. Lasix gtt stopped yesterday and po torsemide started. Weight down 1 pound. Remains on IV heparin. No bleeding.   Feeling better. Walking halls without too mch difficulty. Still edematous. No orrthopnea or PND. Says urine output has slowed down off IV lasix.   CVP 2 (checked personally)   Cr 1.7 -> 1.84 -> 1.65 -> 1.7  PFTs 12/01/18 FEV1 1.33 (35%) FVC   1.75 (34%) Ratio 76% DLCO 33%  Seen by Dr. Ron Agee today. Felt not to be candidate for VAD at this point due to Pulmonary status   Objective:   Weight Range: 81.4 kg Body mass index is 24.34 kg/m.   Vital Signs:   Temp:  [97.5 F (36.4 C)-98.5 F (36.9 C)] 97.5 F (36.4 C) (02/29 0700) Pulse Rate:  [81-94] 93 (02/29 1035) Resp:  [18-32] 20 (02/29 1035) BP: (131-150)/(53-68) 131/68 (02/29 0700) SpO2:  [90 %-99 %] 96 % (02/29 1035) FiO2 (%):  [32 %] 32 % (02/29 1035) Weight:  [81.4 kg] 81.4 kg (02/29 0323) Last BM Date: 12/01/18  Weight change: Filed Weights   11/30/18 0620 12/01/18 0500 12/02/18 0323  Weight: 85.3 kg 82 kg 81.4 kg    Intake/Output:   Intake/Output Summary (Last 24 hours) at 12/02/2018 1039 Last data filed at 12/02/2018 0900 Gross per 24 hour  Intake 1077.45 ml  Output 1040 ml  Net 37.45 ml     Physical Exam    General:  Sitting up on side of bed. Unkempt No resp difficulty HEENT: normal Neck: supple. no JVD. Carotids 2+ bilat; no bruits. No lymphadenopathy or thryomegaly appreciated. Cor: PMI laterally displaced. Regular rate & rhythm. No rubs, gallops or murmurs. Lungs: clear Abdomen: soft, nontender, nondistended. No hepatosplenomegaly. No bruits or masses. Good bowel sounds. Extremities: no cyanosis, clubbing, rash, 2-3+ edema into thighs. +Unna boots  Neuro: alert & orientedx3, cranial nerves grossly intact. moves all 4 extremities w/o  difficulty. Affect pleasant   Telemetry   NSR V paced 80-90s, personally reviewed.   EKG    No new tracings.    Labs    CBC Recent Labs    12/01/18 1606 12/02/18 0515  WBC 5.0 4.6  NEUTROABS 3.7 3.3  HGB 8.5* 8.2*  HCT 28.5* 26.9*  MCV 97.3 96.8  PLT 203 387   Basic Metabolic Panel Recent Labs    11/30/18 0430 12/01/18 0407 12/02/18 0515  NA 132* 133* 133*  K 4.5 3.8 4.1  CL 90* 89* 91*  CO2 33* 34* 34*  GLUCOSE 322* 203* 293*  BUN 43* 46* 43*  CREATININE 1.84* 1.65* 1.71*  CALCIUM 8.6* 8.5* 8.5*  MG 2.0  --   --    Liver Function Tests No results for input(s): AST, ALT, ALKPHOS, BILITOT, PROT, ALBUMIN in the last 72 hours. No results for input(s): LIPASE, AMYLASE in the last 72 hours. Cardiac Enzymes No results for input(s): CKTOTAL, CKMB, CKMBINDEX, TROPONINI in the last 72 hours.  BNP: BNP (last 3 results) Recent Labs    09/04/18 1517 10/16/18 1536 11/26/18 1645  BNP 605.0* 2,147.0* 1,629.0*   ProBNP (last 3 results) No results for input(s): PROBNP in the last 8760 hours.  D-Dimer No results for input(s): DDIMER in the last 72 hours. Hemoglobin A1C Recent Labs    12/01/18 1606  HGBA1C 7.7*   Fasting Lipid Panel Recent Labs    12/01/18 1606  CHOL 115  HDL 59  LDLCALC 36  TRIG 100  CHOLHDL 1.9   Thyroid Function Tests No results for input(s): TSH, T4TOTAL, T3FREE, THYROIDAB in the last 72 hours.  Invalid input(s): FREET3  Other results:  Imaging   Ct Abdomen Pelvis Wo Contrast  Result Date: 12/02/2018 CLINICAL DATA:  VAD candidacy, evaluation EXAM: CT CHEST, ABDOMEN AND PELVIS WITHOUT CONTRAST TECHNIQUE: Multidetector CT imaging of the chest, abdomen and pelvis was performed following the standard protocol without IV contrast. COMPARISON:  11/25/2014 FINDINGS: CT CHEST FINDINGS Cardiovascular: Cardiac silhouette is mildly enlarged left coronary artery stent. No pericardial effusion. Great vessels are normal caliber. Mild aortic  atherosclerotic calcifications. Well-positioned biventricular cardioverter-defibrillator. Mediastinum/Nodes: No neck base or axillary masses or enlarged nodes. Single mildly enlarged precarinal lymph node measuring 1.5 cm short axis. No other mediastinal adenopathy. No hilar masses or adenopathy. Trachea and esophagus are unremarkable. Lungs/Pleura: Small bilateral pleural effusions. Dependent opacity in both lower lobes consistent with atelectasis. Milder dependent opacity in the left upper lobe lingula and right middle lobe, also consistent with atelectasis. Mild centrilobular emphysema. Mild bilateral interstitial thickening. Patchy hazy areas of airspace opacity upper lobes and aerated portions of the lower lobes. No pneumothorax. Musculoskeletal: No chest wall mass or suspicious bone lesions identified. CT ABDOMEN PELVIS FINDINGS Hepatobiliary: No focal liver abnormality is seen. No gallstones, gallbladder wall thickening, or biliary dilatation. Pancreas: Unremarkable. No pancreatic ductal dilatation or surrounding inflammatory changes. Spleen: Normal in size without focal abnormality. Adrenals/Urinary Tract: No adrenal masses. Kidneys normal size, orientation and position. No renal masses, collecting system stones or hydronephrosis. Normal ureters. Normal bladder. Stomach/Bowel: Status post partial colectomy. Stable sigmoid colon anastomosis staple line. Residual colon shows no wall thickening or adjacent inflammation. Small bowel is normal in caliber with no wall thickening or inflammation. Stomach is unremarkable. Vascular/Lymphatic: Dense aortic and iliac artery atherosclerotic calcifications. No aneurysm. There are prominent, but subcentimeter, retroperitoneal, pelvic and inguinal lymph nodes. Reproductive: Unremarkable. Other: Trace amount of ascites. No abdominal wall hernia. Subcutaneous edema is seen predominantly in the lower abdomen and pelvis. Musculoskeletal: No fracture or acute finding. No  osteoblastic or osteolytic lesions. IMPRESSION: CHEST CT 1. Mild cardiomegaly. Bilateral effusions with interstitial thickening in the lungs and patchy hazy airspace opacities in the none atelectatic lungs, consistent with pulmonary edema. 2. Bilateral lower lobe, right middle lobe and left upper lobe lingula atelectasis. ABDOMEN AND PELVIS CT 1. No acute findings. 2. Trace ascites. Diffuse subcutaneous edema consistent with anasarca or, more likely, right heart failure. 3. Status post colon surgery. 4. Aortic atherosclerosis. Electronically Signed   By: Lajean Manes M.D.   On: 12/02/2018 07:23   Ct Chest Wo Contrast  Result Date: 12/02/2018 CLINICAL DATA:  VAD candidacy, evaluation EXAM: CT CHEST, ABDOMEN AND PELVIS WITHOUT CONTRAST TECHNIQUE: Multidetector CT imaging of the chest, abdomen and pelvis was performed following the standard protocol without IV contrast. COMPARISON:  11/25/2014 FINDINGS: CT CHEST FINDINGS Cardiovascular: Cardiac silhouette is mildly enlarged left coronary artery stent. No pericardial effusion. Great vessels are normal caliber. Mild aortic atherosclerotic calcifications. Well-positioned biventricular cardioverter-defibrillator. Mediastinum/Nodes: No neck base or axillary masses or enlarged nodes. Single mildly enlarged precarinal lymph node measuring 1.5 cm short axis. No other mediastinal adenopathy. No hilar masses or adenopathy. Trachea and esophagus are unremarkable. Lungs/Pleura: Small bilateral pleural effusions. Dependent opacity in both lower lobes consistent with atelectasis. Milder dependent opacity in the left upper lobe lingula and right middle lobe, also consistent with atelectasis. Mild centrilobular emphysema. Mild bilateral interstitial  thickening. Patchy hazy areas of airspace opacity upper lobes and aerated portions of the lower lobes. No pneumothorax. Musculoskeletal: No chest wall mass or suspicious bone lesions identified. CT ABDOMEN PELVIS FINDINGS  Hepatobiliary: No focal liver abnormality is seen. No gallstones, gallbladder wall thickening, or biliary dilatation. Pancreas: Unremarkable. No pancreatic ductal dilatation or surrounding inflammatory changes. Spleen: Normal in size without focal abnormality. Adrenals/Urinary Tract: No adrenal masses. Kidneys normal size, orientation and position. No renal masses, collecting system stones or hydronephrosis. Normal ureters. Normal bladder. Stomach/Bowel: Status post partial colectomy. Stable sigmoid colon anastomosis staple line. Residual colon shows no wall thickening or adjacent inflammation. Small bowel is normal in caliber with no wall thickening or inflammation. Stomach is unremarkable. Vascular/Lymphatic: Dense aortic and iliac artery atherosclerotic calcifications. No aneurysm. There are prominent, but subcentimeter, retroperitoneal, pelvic and inguinal lymph nodes. Reproductive: Unremarkable. Other: Trace amount of ascites. No abdominal wall hernia. Subcutaneous edema is seen predominantly in the lower abdomen and pelvis. Musculoskeletal: No fracture or acute finding. No osteoblastic or osteolytic lesions. IMPRESSION: CHEST CT 1. Mild cardiomegaly. Bilateral effusions with interstitial thickening in the lungs and patchy hazy airspace opacities in the none atelectatic lungs, consistent with pulmonary edema. 2. Bilateral lower lobe, right middle lobe and left upper lobe lingula atelectasis. ABDOMEN AND PELVIS CT 1. No acute findings. 2. Trace ascites. Diffuse subcutaneous edema consistent with anasarca or, more likely, right heart failure. 3. Status post colon surgery. 4. Aortic atherosclerosis. Electronically Signed   By: Lajean Manes M.D.   On: 12/02/2018 07:23    Medications:    Scheduled Medications: . ferrous sulfate  325 mg Oral BID  . hydrALAZINE  50 mg Oral Q8H  . insulin aspart  0-20 Units Subcutaneous TID WC  . insulin aspart  0-5 Units Subcutaneous QHS  . insulin aspart  4 Units  Subcutaneous TID WC  . insulin glargine  12 Units Subcutaneous Daily  . isosorbide mononitrate  60 mg Oral Daily  . levalbuterol  1.25 mg Nebulization Q6H  . magnesium oxide  400 mg Oral BID  . mouth rinse  15 mL Mouth Rinse BID  . mometasone-formoterol  2 puff Inhalation BID  . pantoprazole  40 mg Oral QAC breakfast  . primidone  50 mg Oral Daily  . sertraline  50 mg Oral Daily  . simvastatin  40 mg Oral QHS  . sodium chloride flush  10-40 mL Intracatheter Q12H  . spironolactone  12.5 mg Oral Daily  . torsemide  60 mg Oral BID    Infusions: . heparin 1,700 Units/hr (12/02/18 0602)  . milrinone 0.25 mcg/kg/min (12/02/18 0602)    PRN Medications: acetaminophen **OR** acetaminophen, albuterol, nitroGLYCERIN, sodium chloride flush, sodium chloride flush  Patient Profile   [name] is a 63 y.o. male  with h/o Paroxysmal Afib, CAD, OA of both hands, chronic combined systolic/diastolic CHF, CHB s/p [employer] BiV ICD, Carpal Tunnel syndrome, h/o Colon CA, COPD, DM2, peripheral neuropathy, ETOH abuse, GERD, HLD, HTN, and h/o tobacco abuse.   Admitted 11/26/2018 with worsening DOE and edema despite adjustment of outpatient diuretics. Moved to [facility] 11/29/18 with low output by PICC line.   Assessment/Plan   1. Acute on chronic systolic CHF: Ischemic cardiomyopathy. [employer] CRT-D device.  Most recent echo in 12/19 with EF 20-25%, moderately dilated RV with mildly decreased systolic function, mild AS, moderate MR, moderate TR, PASP 50 mmHg.  PICC in place, co-ox suggested low output at 49%.  He remains markedly volume overloaded. Breathing improved.  Coox 73.5% on milrinone 0.25 mcg/kg/min.  - Still with marked peripheral edema though CVP only 2 (checked personally). Now off IV lasix and on po torsemide. Hopefully will continue to diurese slowly. UA with mild amount of protein but unlikely nephrotic range. Albumin 3.0 - Will continue torsemide 60 mg BID hopefully will continue to  diurese steadily without exceeding capillary refill rate. Will likely hold on Sunday for cath Monday.  - Continue hydralazine at 50 mg TID and Imdur 60 mg daily. Can attempt to transition to losartan/Entresto if renal function stable post cath.  - Continue spiro 12.5.  - Seen by Dr. PVT today. Felt not to be candidate for VAD at this point due to Pulmonary status.  - Plan [facility] Monday.  2. CAD: Anterior MI in 2006 with PCI to LAD.  DES for in-stent restenosis in 2007.  No cath/intervention since that time.  - No s/s of ischemia.    - Given warfarin anticoagulation, he is not on ASA. Currently on heparin - Continue Zocor.  - Cath Monday if renal function stable.  3. AKI on CKD stage 3: - Cr 1.7 -> 1.84 -> 1.65 -> 1.7. Suspect cardiorenal syndrome.   4. COPD: He recently quit smoking.  Severe COPD by PFTs on 2/28 - PFTs  2/28.FEV1 1.33 (35%) DLCO 33% - Off prednisone. No wheeze. Suspect primarily CHF.  5. Type II diabetes:  - SSI while in house. Should improve off prednisone.  6. Atrial fibrillation: Paroxysmal.  - Appears to be in NSR - Holding coumadin to allow INR to trend down for cath. INR 1.3. Covering with heparin. Discussed dosing with PharmD personally.   Medication concerns reviewed with patient and pharmacy team. Barriers identified: None at this time.   Length of Stay: [place], MD  12/02/2018, 10:39 AM  Advanced Heart Failure Team Pager [phone] (M-F; [place])  Please contact [facility] for night-coverage after hours (4p -7a ) and weekends on [email or web address]

## 2018-12-02 NOTE — Consult Note (Signed)
[address]       [place] [address]             [phone]        Ebon L Manninen [facility] Medical Record #[id number] Date of Birth: 1956-06-02  Referring: Haydee Monica MD Primary Care: Sinda Du, MD Primary Cardiologist:Branch, Roderic Palau, MD  Chief Complaint:    Chief Complaint  Patient presents with  . Shortness of Breath  Patient examined, images of 2D echocardiogram and CT scan of chest and abdomen as well as pulmonary function data personally reviewed and discussed with patient  History of Present Illness:     63 year old Caucasian male with history of mixed ischemic nonischemic cardiomyopathy admitted in respiratory distress and pulmonary edema with low cardiac output.  He required BiPAP support but was not intubated.  He was treated with diuretics placed on milrinone.  Mixed venous saturation improved on milrinone.  Creatinine is 1.8.  Chest x-ray showed edema with right pleural effusion.  CT scan shows bilateral dense parenchymal disease without evidence of malignancy.  The patient has home oxygen.  He was smoking heavily up until clinical deterioration about 2 weeks ago.  Echocardiogram shows EF 20-25% with apical hypokinesia, moderate MR, mild-moderate TR, mild RV dysfunction.  Aortic valve functioning well.  No pericardial effusion.  Patient is in chronic atrial fibrillation and has been on Coumadin for several years without problems with GI bleeding.  He did present anemic with a hemoglobin of 9.  2007 the patient had subtotal colectomy for a large tubular adenoma at the ileocecal valve.  He has had subsequent colonoscopy showing no malignancy.  He has chronic diarrhea because of resection of most of his colon.  Upper endoscopy has documented evidence of Barrett's esophagitis at the GE junction.  The patient stop drinking heavily after his MI in 2006.  The patient has been disabled since 2006.  Previously worked in a Armed forces operational officer.  He  is married with 1 child age 63 years and a grandchild age 63.  Patient has significant chronic lung disease.  On admission his PCO2 was 58.  FEV1 is 1.3.  Diffusion capacity is only 40% of predicted.  The patient is short of breath with conversation.  Patient has poorly controlled diabetes with a globin A1c 7.7 Current Activity/ Functional Status: Poor functional status at home.  Wife in chronic poor health.   Zubrod Score: At the time of surgery this patient's most appropriate activity status/level should be described as: []     0    Normal activity, no symptoms []     1    Restricted in physical strenuous activity but ambulatory, able to do out light work []     2    Ambulatory and capable of self care, unable to do work activities, up and about                 more than 50%  Of the time                            [x]     3    Only limited self care, in bed greater than 50% of waking hours []     4    Completely disabled, no self care, confined to bed or chair []     5    Moribund  Past Medical History:  Diagnosis Date  . Arthritis   . Biventricular ICD (  implantable cardioverter-defibrillator) in place   . CAD (coronary artery disease)    Acute MI in 2006 -> LAD stent  . Cardiomyopathy ([place])   . Carpal tunnel syndrome   . Chronic systolic (congestive) heart failure ([place])   . Colon cancer ([place]) 2006   Partial colectomy in 2006  . Complete heart block ([place])    S/P ICD 02/09/13  . COPD (chronic obstructive pulmonary disease) ([place])   . Diabetes mellitus, type II ([place])   . H/O alcohol dependence ([place])   . H/O hiatal hernia   . History of pneumonia 2013  . Hyperlipidemia   . Hypertension   . Myocardial infarction [facility])    Hx: of 2006  . Neuropathy in diabetes ([place])   . PAF (paroxysmal atrial fibrillation) ([place])     Past Surgical History:  Procedure Laterality Date  . ANKLE SURGERY     Right  . Biventricular ICD Placement  02/09/2013   Dr. Lovena Le  . COLONOSCOPY  08/17/2005    Pancolonic diverticula/Multiple rectal polyps removed as described above. The larger of the two likely responsible for intermittent hematochezia/ Multiple polyps at the hepatic flexure resected with a snare./ Large polypoid lesion growing out of the base of the cecum, just behind the  ileocecal valve. This was not felt to be amendable to endoscopic resection. Biopsied multiple times  . CORONARY ANGIOPLASTY WITH STENT PLACEMENT    . ESOPHAGOGASTRODUODENOSCOPY N/A 03/29/2017   Barrett's esophagus, gastritis, duodenitis, focal glandular atypia on biopsy. Needs surveillance June 2019   . FLEXIBLE SIGMOIDOSCOPY    06/13/2006   Essentially normal residual rectum status post total colectomy. anastomosis at 25cm.  Focal area of abnormality adjacent to suture, as described above, likely not significant, suspect granulation tissue, biopsied.  Anastomosis otherwise appeared normal.  Distal terminal ileum appeared normal  . FLEXIBLE SIGMOIDOSCOPY N/A 01/13/2015   Dr. Gala Romney: normal-appearing residual rectum, surgical anastomosis, s/p subtotal colectomy. Surveillance 5 years   . FLEXIBLE SIGMOIDOSCOPY N/A 03/29/2017   Dr. Oneida Alar while inpatient: intermittent rectal bleeding due to ischemic erosion at anastomosis,s/p APC therapy. one 3 mm polyp in recum (hyperplastic). surveillance in 3 years  . INGUINAL HERNIA REPAIR    . MULTIPLE EXTRACTIONS WITH ALVEOLOPLASTY N/A 04/09/2013   Procedure: Extraction of tooth #'s 1,2,4,5,6,7,8,9,10,11,12,13,17,18,20,21,22,23,27,28, 29,30,31, and 32 with alveoloplasty.;  Surgeon: Lenn Cal, DDS;  Location: [place];  Service: Oral Surgery;  Laterality: N/A;  . PERMANENT PACEMAKER INSERTION N/A 02/09/2013   Procedure: PERMANENT PACEMAKER INSERTION;  Surgeon: Evans Lance, MD;  Location: [facility] CATH LAB;  Service: Cardiovascular;  Laterality: N/A;  . SUBTOTAL COLECTOMY  09/01/2005   subtotal colectomy  . TEMPORARY PACEMAKER INSERTION Right 02/09/2013   Procedure: TEMPORARY PACEMAKER  INSERTION;  Surgeon: Sinclair Grooms, MD;  Location: [facility] CATH LAB;  Service: Cardiovascular;  Laterality: Right;    Social History   Tobacco Use  Smoking Status Current Every Day Smoker  . Packs/day: 0.10  . Years: 40.00  . Pack years: 4.00  . Types: Cigarettes  . Start date: 07/13/1971  . Last attempt to quit: 11/13/2018  . Years since quitting: 0.0  Smokeless Tobacco Never Used    Social History   Substance and Sexual Activity  Alcohol Use No  . Alcohol/week: 0.0 standard drinks     Allergies  Allergen Reactions  . Vancomycin Itching and Rash    Current Facility-Administered Medications  Medication Dose Route Frequency Provider Last Rate Last Dose  . acetaminophen (TYLENOL) tablet 650 mg  650  mg Oral Q6H PRN Erma Heritage, PA-C   650 mg at 11/28/18 2355   Or  . acetaminophen (TYLENOL) suppository 650 mg  650 mg Rectal Q6H PRN Ahmed Prima, Fransisco Hertz, PA-C      . albuterol (PROVENTIL) (2.5 MG/3ML) 0.083% nebulizer solution 2.5 mg  2.5 mg Nebulization Q6H PRN Ahmed Prima, [place] M, PA-C   2.5 mg at 12/01/18 2232  . ferrous sulfate tablet 325 mg  325 mg Oral BID Bernerd Pho M, PA-C   325 mg at 12/01/18 2200  . heparin ADULT infusion 100 units/mL (25000 units/231mL sodium chloride 0.45%)  1,700 Units/hr Intravenous Continuous Erenest Blank, RPH 17 mL/hr at 12/02/18 0602 1,700 Units/hr at 12/02/18 0602  . hydrALAZINE (APRESOLINE) tablet 50 mg  50 mg Oral Q8H Shirley Friar, PA-C   50 mg at 12/02/18 7322  . insulin aspart (novoLOG) injection 0-20 Units  0-20 Units Subcutaneous TID WC Erma Heritage, PA-C   15 Units at 12/02/18 0750  . insulin aspart (novoLOG) injection 0-5 Units  0-5 Units Subcutaneous QHS Erma Heritage, PA-C   4 Units at 12/01/18 2205  . insulin aspart (novoLOG) injection 4 Units  4 Units Subcutaneous TID WC Erma Heritage, PA-C   4 Units at 12/02/18 0750  . insulin glargine (LANTUS) injection 12 Units  12 Units Subcutaneous  Daily Erma Heritage, [place]   12 Units at 12/01/18 0254  . isosorbide mononitrate (IMDUR) 24 hr tablet 60 mg  60 mg Oral Daily Shirley Friar, PA-C   60 mg at 12/01/18 2706  . levalbuterol (XOPENEX) nebulizer solution 1.25 mg  1.25 mg Nebulization Q6H Prescott Gum, Collier Salina, MD      . magnesium oxide (MAG-OX) tablet 400 mg  400 mg Oral BID Bernerd Pho M, PA-C   400 mg at 12/01/18 2201  . MEDLINE mouth rinse  15 mL Mouth Rinse BID Erma Heritage, PA-C   15 mL at 12/01/18 2206  . milrinone (PRIMACOR) 20 MG/100 ML (0.2 mg/mL) infusion  0.25 mcg/kg/min Intravenous Continuous Sinda Du, MD 6.45 mL/hr at 12/02/18 0602 0.25 mcg/kg/min at 12/02/18 0602  . mometasone-formoterol (DULERA) 200-5 MCG/ACT inhaler 2 puff  2 puff Inhalation BID Prescott Gum, Collier Salina, MD      . nitroGLYCERIN (NITROSTAT) SL tablet 0.4 mg  0.4 mg Sublingual Q5 Min x 3 PRN Strader, Brittany M, PA-C      . pantoprazole (PROTONIX) EC tablet 40 mg  40 mg Oral QAC breakfast Bernerd Pho M, PA-C   40 mg at 12/02/18 0827  . primidone (MYSOLINE) tablet 50 mg  50 mg Oral Daily Bernerd Pho M, PA-C   50 mg at 12/01/18 2376  . sertraline (ZOLOFT) tablet 50 mg  50 mg Oral Daily Bernerd Pho M, PA-C   50 mg at 12/01/18 2831  . simvastatin (ZOCOR) tablet 40 mg  40 mg Oral QHS Erma Heritage, PA-C   40 mg at 12/01/18 2203  . sodium chloride flush (NS) 0.9 % injection 10-40 mL  10-40 mL Intracatheter Q12H Strader, [place] M, PA-C   10 mL at 12/01/18 2206  . sodium chloride flush (NS) 0.9 % injection 10-40 mL  10-40 mL Intracatheter PRN Ahmed Prima, [place] M, PA-C      . sodium chloride flush (NS) 0.9 % injection 10-40 mL  10-40 mL Intracatheter PRN Jettie Booze, MD      . spironolactone (ALDACTONE) tablet 12.5 mg  12.5 mg Oral Daily Shirley Friar, PA-C   12.5 mg  at 12/01/18 0927  . torsemide (DEMADEX) tablet 60 mg  60 mg Oral BID Shirley Friar, PA-C   60 mg at 12/02/18 0825     Medications Prior to Admission  Medication Sig Dispense Refill Last Dose  . acetaminophen (TYLENOL) 500 MG tablet Take 500 mg by mouth every 6 (six) hours as needed for mild pain or moderate pain.    11/25/2018  . albuterol (PROVENTIL) (2.5 MG/3ML) 0.083% nebulizer solution Take 2.5 mg by nebulization every 6 (six) hours as needed. For shortness of breath   11/25/2018  . ferrous sulfate 325 (65 FE) MG EC tablet Take 325 mg by mouth 2 (two) times daily.   11/25/2018  . insulin glargine (LANTUS) 100 UNIT/ML injection Inject 20 Units into the skin daily as needed (for high blood sugar).    11/25/2018  . Lactobacillus (ACIDOPHOLUS PO) Take 1 tablet by mouth daily.   11/25/2018  . magnesium oxide (MAG-OX) 400 MG tablet Take 400 mg by mouth 2 (two) times daily.   11/25/2018  . metolazone (ZAROXOLYN) 2.5 MG tablet Take 2.5 mg (1 tablet) on Monday, Wednesday & Friday 30 minutes prior to taking torsemide. 30 tablet 3 11/25/2018  . Multiple Vitamin (MULTIVITAMIN) tablet Take 1 tablet by mouth daily.   11/25/2018  . pantoprazole (PROTONIX) 40 MG tablet Take 1 tablet (40 mg total) by mouth daily before breakfast. 30 tablet 12 11/25/2018  . primidone (MYSOLINE) 50 MG tablet Take 50 mg by mouth daily.    11/25/2018  . sertraline (ZOLOFT) 50 MG tablet Take 50 mg by mouth daily.   11/25/2018  . simvastatin (ZOCOR) 40 MG tablet Take 40 mg by mouth at bedtime.    11/25/2018  . torsemide (DEMADEX) 20 MG tablet Take 2 tablets (40 mg total) by mouth 2 (two) times daily. 360 tablet 3 11/25/2018  . warfarin (COUMADIN) 4 MG tablet Take 3 tablets daily or as directed (Patient taking differently: Take 12 mg by mouth every evening. ) 260 tablet 3 11/25/2018 at 2000  . albuterol (PROVENTIL HFA;VENTOLIN HFA) 108 (90 BASE) MCG/ACT inhaler Inhale 2 puffs into the lungs every 6 (six) hours as needed. For shortness of breath   Taking  . nitroGLYCERIN (NITROSTAT) 0.4 MG SL tablet Place 1 tablet (0.4 mg total) under the tongue every 5 (five)  minutes x 3 doses as needed. For chest pain 25 tablet 3  at Unknown time  . ondansetron (ZOFRAN) 4 MG tablet Take 4 mg by mouth 4 (four) times daily as needed for nausea or vomiting.   Taking    Family History  Problem Relation Age of Onset  . Colon cancer Mother        Demont Linford, ?>age 23.  [name] Heart disease Mother   . Heart disease Father   . Hypertension Brother   . Heart disease Brother   . Diabetes Brother   . Liver disease Neg Hx      Review of Systems:   ROS Patient has had total dental extraction several years ago by Dr. Lawana Chambers.  No history of thoracic trauma.    Cardiac Review of Systems: Y or  [    ]= no  Chest Pain [    ]  Resting SOB [  y ] Exertional SOB  [ y ]  Vertell Limber Blue.Reese  ]   Pedal Edema [   ]    Palpitations [  ] Syncope  [  ]   Presyncope [   ]  General Review  of Systems: [Y] = yes [  ]=no Constitional: recent weight change [ y ]; anorexia [ y ]; fatigue y[  ]; nausea [  ]; night sweats [  ]; fever [  ]; or chills [  ]                                                               Dental: Last Dentist visit: Over 1 year  Eye : blurred vision [  ]; diplopia [   ]; vision changes [  ];  Amaurosis fugax[  ]; Resp: cough [  ];  wheezing[  ];  hemoptysis[  ]; shortness of breath[  ]; paroxysmal nocturnal dyspnea[  ]; dyspnea on exertioyn[y  ]; or orthopnea[ y ];  GI:  gallstones[  ], vomiting[  ];  dysphagia[  ]; melena[  ];  hematochezia [  ]; heartburn[y  ];   Hx of  Colonoscopyy[  y]; GU: kidney stones [  ]; hematuria[  ];   dysuria [  ];  nocturia[  ];  history of     obstruction [  ]; urinary frequency [  ]             Skin: rash, swelling[  ];, hair loss[  ];  peripheral edema[  ];  or itching[  ]; Musculosketetal: myalgias[  ];  joint swelling[  ];  joint erythema[  ]; osteoarthritis of his hands, elevated uric acid 11.0  joint pain[y  ];  back pain[y  ];  Heme/Lymph: bruising[  ];  bleeding[  ];  anemia[  ];  Neuro: TIA[  ];  headaches[  ];  stroke[  ];   vertigo[  ];  seizures[  ];   paresthesias[  ];  difficulty walking[  ];  Psych:depression[y  ]; anxiety[  ];  Endocrine: diabetes[ y ];  thyroid dysfunction[  ];     Physical Exam: BP 131/68 (BP Location: Left Arm)   Pulse 93   Temp (!) 97.5 F (36.4 C) (Axillary)   Resp 20   Ht 6' (1.829 m)   Wt 81.4 kg   SpO2 99%   BMI 24.34 kg/m        Physical Exam  General: Chronically ill middle-aged male short of breath with conversation HEENT: Normocephalic pupils equal , full dental extraction Neck: Supple without JVD, adenopathy, or bruit Chest: Scattered rhonchi bilaterally without tenderness             or deformity Cardiovascular: Irregular rate and rhythm, 3/6 systolic MR murmur, n+ S3 gallop, peripheral pulses trace in extremities             palpable in all extremities Abdomen:  Soft, nontender, no palpable mass or organomegaly Extremities: Both legs Ace wrapped for edema              no venous stasis changes of the legs Rectal/GU: Deferred Neuro: Grossly non--focal and symmetrical throughout Skin: Clean and dry without rash or ulceration   Diagnostic Studies & Laboratory data:     Recent Radiology Findings:   Ct Abdomen Pelvis Wo Contrast  Result Date: 12/02/2018 CLINICAL DATA:  VAD candidacy, evaluation EXAM: CT CHEST, ABDOMEN AND PELVIS WITHOUT CONTRAST TECHNIQUE: Multidetector CT imaging of the chest, abdomen and pelvis was performed following the standard  protocol without IV contrast. COMPARISON:  11/25/2014 FINDINGS: CT CHEST FINDINGS Cardiovascular: Cardiac silhouette is mildly enlarged left coronary artery stent. No pericardial effusion. Great vessels are normal caliber. Mild aortic atherosclerotic calcifications. Well-positioned biventricular cardioverter-defibrillator. Mediastinum/Nodes: No neck base or axillary masses or enlarged nodes. Single mildly enlarged precarinal lymph node measuring 1.5 cm short axis. No other mediastinal adenopathy. No hilar masses or  adenopathy. Trachea and esophagus are unremarkable. Lungs/Pleura: Small bilateral pleural effusions. Dependent opacity in both lower lobes consistent with atelectasis. Milder dependent opacity in the left upper lobe lingula and right middle lobe, also consistent with atelectasis. Mild centrilobular emphysema. Mild bilateral interstitial thickening. Patchy hazy areas of airspace opacity upper lobes and aerated portions of the lower lobes. No pneumothorax. Musculoskeletal: No chest wall mass or suspicious bone lesions identified. CT ABDOMEN PELVIS FINDINGS Hepatobiliary: No focal liver abnormality is seen. No gallstones, gallbladder wall thickening, or biliary dilatation. Pancreas: Unremarkable. No pancreatic ductal dilatation or surrounding inflammatory changes. Spleen: Normal in size without focal abnormality. Adrenals/Urinary Tract: No adrenal masses. Kidneys normal size, orientation and position. No renal masses, collecting system stones or hydronephrosis. Normal ureters. Normal bladder. Stomach/Bowel: Status post partial colectomy. Stable sigmoid colon anastomosis staple line. Residual colon shows no wall thickening or adjacent inflammation. Small bowel is normal in caliber with no wall thickening or inflammation. Stomach is unremarkable. Vascular/Lymphatic: Dense aortic and iliac artery atherosclerotic calcifications. No aneurysm. There are prominent, but subcentimeter, retroperitoneal, pelvic and inguinal lymph nodes. Reproductive: Unremarkable. Other: Trace amount of ascites. No abdominal wall hernia. Subcutaneous edema is seen predominantly in the lower abdomen and pelvis. Musculoskeletal: No fracture or acute finding. No osteoblastic or osteolytic lesions. IMPRESSION: CHEST CT 1. Mild cardiomegaly. Bilateral effusions with interstitial thickening in the lungs and patchy hazy airspace opacities in the none atelectatic lungs, consistent with pulmonary edema. 2. Bilateral lower lobe, right middle lobe and left  upper lobe lingula atelectasis. ABDOMEN AND PELVIS CT 1. No acute findings. 2. Trace ascites. Diffuse subcutaneous edema consistent with anasarca or, more likely, right heart failure. 3. Status post colon surgery. 4. Aortic atherosclerosis. Electronically Signed   By: Lajean Manes M.D.   On: 12/02/2018 07:23   Ct Chest Wo Contrast  Result Date: 12/02/2018 CLINICAL DATA:  VAD candidacy, evaluation EXAM: CT CHEST, ABDOMEN AND PELVIS WITHOUT CONTRAST TECHNIQUE: Multidetector CT imaging of the chest, abdomen and pelvis was performed following the standard protocol without IV contrast. COMPARISON:  11/25/2014 FINDINGS: CT CHEST FINDINGS Cardiovascular: Cardiac silhouette is mildly enlarged left coronary artery stent. No pericardial effusion. Great vessels are normal caliber. Mild aortic atherosclerotic calcifications. Well-positioned biventricular cardioverter-defibrillator. Mediastinum/Nodes: No neck base or axillary masses or enlarged nodes. Single mildly enlarged precarinal lymph node measuring 1.5 cm short axis. No other mediastinal adenopathy. No hilar masses or adenopathy. Trachea and esophagus are unremarkable. Lungs/Pleura: Small bilateral pleural effusions. Dependent opacity in both lower lobes consistent with atelectasis. Milder dependent opacity in the left upper lobe lingula and right middle lobe, also consistent with atelectasis. Mild centrilobular emphysema. Mild bilateral interstitial thickening. Patchy hazy areas of airspace opacity upper lobes and aerated portions of the lower lobes. No pneumothorax. Musculoskeletal: No chest wall mass or suspicious bone lesions identified. CT ABDOMEN PELVIS FINDINGS Hepatobiliary: No focal liver abnormality is seen. No gallstones, gallbladder wall thickening, or biliary dilatation. Pancreas: Unremarkable. No pancreatic ductal dilatation or surrounding inflammatory changes. Spleen: Normal in size without focal abnormality. Adrenals/Urinary Tract: No adrenal masses.  Kidneys normal size, orientation and position. No renal masses, collecting system  stones or hydronephrosis. Normal ureters. Normal bladder. Stomach/Bowel: Status post partial colectomy. Stable sigmoid colon anastomosis staple line. Residual colon shows no wall thickening or adjacent inflammation. Small bowel is normal in caliber with no wall thickening or inflammation. Stomach is unremarkable. Vascular/Lymphatic: Dense aortic and iliac artery atherosclerotic calcifications. No aneurysm. There are prominent, but subcentimeter, retroperitoneal, pelvic and inguinal lymph nodes. Reproductive: Unremarkable. Other: Trace amount of ascites. No abdominal wall hernia. Subcutaneous edema is seen predominantly in the lower abdomen and pelvis. Musculoskeletal: No fracture or acute finding. No osteoblastic or osteolytic lesions. IMPRESSION: CHEST CT 1. Mild cardiomegaly. Bilateral effusions with interstitial thickening in the lungs and patchy hazy airspace opacities in the none atelectatic lungs, consistent with pulmonary edema. 2. Bilateral lower lobe, right middle lobe and left upper lobe lingula atelectasis. ABDOMEN AND PELVIS CT 1. No acute findings. 2. Trace ascites. Diffuse subcutaneous edema consistent with anasarca or, more likely, right heart failure. 3. Status post colon surgery. 4. Aortic atherosclerosis. Electronically Signed   By: Lajean Manes M.D.   On: 12/02/2018 07:23     I have independently reviewed the above radiologic studies and discussed with the patient   Recent Lab Findings: Lab Results  Component Value Date   WBC 4.6 12/02/2018   HGB 8.2 (L) 12/02/2018   HCT 26.9 (L) 12/02/2018   PLT 177 12/02/2018   GLUCOSE 293 (H) 12/02/2018   CHOL 115 12/01/2018   TRIG 100 12/01/2018   HDL 59 12/01/2018   LDLCALC 36 12/01/2018   ALT 24 11/26/2018   AST 26 11/26/2018   NA 133 (L) 12/02/2018   K 4.1 12/02/2018   CL 91 (L) 12/02/2018   CREATININE 1.71 (H) 12/02/2018   BUN 43 (H) 12/02/2018   CO2  34 (H) 12/02/2018   TSH 1.026 09/04/2018   INR 1.3 (H) 12/02/2018   HGBA1C 7.7 (H) 12/01/2018      Assessment / Plan:      Mixed ischemic nonischemic cardiomyopathy with severe advanced heart failure, low cardiac output, low EF. Moderate MR with LV dilatation, RV function mildly impaired with mild-moderate TR.  Chronic atrial fibrillation  Patient appears to have responded somewhat to milrinone and diuretics.  Still remains very dyspneic even with conversation.  Pulmonary status currently would preclude sternotomy and VAD implantation.  I have suspect that his pulmonary function could improve but it will probably take some time.  We will follow-up results of right heart cath. Have ordered VQ scan to assess for pulmonary emboli.    @ME1 @ 12/02/2018 9:28 AM

## 2018-12-02 NOTE — Progress Notes (Signed)
Initial Nutrition Assessment  DOCUMENTATION CODES:   Severe malnutrition in context of chronic illness  INTERVENTION:   Magic cup BID with meals, each supplement provides 290 kcal and 9 grams of protein  Snacks TID between meals  Ensure Enlive po BID, each supplement provides 350 kcal and 20 grams of protein  Continue MVI with minerals  If pt unable to meet nutrition needs by oral intake via meals, snacks and supplements, recommend insertion of Cortrak tube with supplemental TF given severe malnutrition on admission    NUTRITION DIAGNOSIS:   Severe Malnutrition related to chronic illness(CHF, COPD, hx of colon cancer s/p colectomy with chronic diarrhea, tobacco abuse) as evidenced by energy intake < 75% for > or equal to 1 month, severe muscle depletion, severe fat depletion.   GOAL:   Patient will meet greater than or equal to 90% of their needs   MONITOR:   PO intake, Supplement acceptance, Labs, Weight trends  REASON FOR ASSESSMENT:   Consult LVAD Eval  ASSESSMENT:   63 yo male admitted in respiratory distress with pulmonary edema and low cardiac output; pt with acute on chronic systolic CHF with ischemic CM, AKI on CKD III. Consult received for pre-LVAD evaluation. PMH includes CAD, acute MI in 2006 with LAD stent, CM, chronic systolic CHF, colon cancer s/p partial colectomy on 2006, COPD, DM, HTN, HLD  ECHO EF 20-25% CT abdomen performed due to lower abdominal pain in setting of hx of colon cancer with current acute on chronic diarrhea and weight loss. No acute findings. On visit today, pt denied abdominal pain, diarrhea or weigh tloss  Per MD notes, pt has been smoking heavily up until clinical deterioration 2 weeks ago  Recorded po intake 25-100%; recorded po intake 66% of meals on average. Pt reports appetite is good; reports he is not receiving enough food on meal trays. Pt reports he ate all of his breakfast this AM and could have eaten a second meal tray. Pt  reports he is agreeable to snacks between meals. Pt does not take ONS at home but agreeable to trying Ensure/Boost. Pt reports at home he eats 1 good meal at home and no snacks. Pt very vague about what his one big meal might be but reports he might eat box dinner or biscuits and gravy.  Pt is aware that he is on a Heart Healthy/Carb Modified diet with 1200 mL fluid restriction while in patient. Pt reports he knows regardless of whether or not he proceeds with LVAD that he will need to change his diet at home and "eat better."   Current wt 81.4 kg; down from admission wt of 87.1 kg. Net negative 11 L per I/O flow sheet with diuresis. Pt reports UBW around 170-172 pounds; denies wt loss but indicates weight gain related to fluid. Current wt is 180 pounds. Plan to utilize UBW when estimating needs.   Labs: sodium 133, potassium wdl, Creatinine 1.71, BUN 43 Meds: ferrous sulfate  NUTRITION - FOCUSED PHYSICAL EXAM:    Most Recent Value  Orbital Region  Mild depletion  Upper Arm Region  Severe depletion  Thoracic and Lumbar Region  Severe depletion  Buccal Region  Mild depletion  Temple Region  Moderate depletion  Clavicle Bone Region  Severe depletion  Clavicle and Acromion Bone Region  Moderate depletion  Scapular Bone Region  Moderate depletion  Dorsal Hand  Severe depletion  Patellar Region  No depletion  Anterior Thigh Region  No depletion  Posterior Calf Region  Unable  to assess [compression ace wraps in place]  Edema (RD Assessment)  Mild       Diet Order:   Diet Order            Diet heart healthy/carb modified Room service appropriate? Yes; Fluid consistency: Thin; Fluid restriction: 1200 mL Fluid  Diet effective now              EDUCATION NEEDS:   Education needs have been addressed  Skin:  Skin Assessment: Reviewed RN Assessment  Last BM:  2/28  Height:   Ht Readings from Last 1 Encounters:  11/29/18 6' (1.829 m)    Weight:   Wt Readings from Last 1  Encounters:  12/02/18 81.4 kg    Ideal Body Weight:     BMI:  Body mass index is 24.34 kg/m.  Estimated Nutritional Needs:   Kcal:  2050-2250 kcals   Protein:  105-115 g  Fluid:  1200 mL fluid restricted   Kerman Passey MS, RD, LDN, CNSC [phone] Pager  [phone] Weekend/On-Call Pager

## 2018-12-02 NOTE — Progress Notes (Signed)
Physical Therapy Treatment Patient Details Name: [name] MRN: [id number] DOB: June 12, 1956 Today's Date: 12/02/2018    History of Present Illness [name] is a 63 y.o. male with medical history significant of chronic atrial fibrillation, normocytic anemia, CAD, osteoarthritis of hands, chronic combined systolic and diastolic heart failure, history of complete heart block, history of AICD placement, carpal tunnel syndrome, history of colon cancer with partial colectomy in 2006, COPD, CAD, type 2 diabetes, diabetic peripheral neuropathy, history of alcohol abuse, GERD, hiatal hernia, hyperlipidemia, hypertension, history of pneumonia, history of tobacco abuse with abstinence for the past 2 weeks who is coming to the emergency department with progressively worse shortness of breath for the past few days.  Work up includes pulmonary edema.    PT Comments    Patient seen for mobility progression. Pt is making progress toward PT goals and tolerated increased gait distance without dyspnea this session. Vitals WNL on 3L O2 via [place]. Continue to progress as tolerated.    Follow Up Recommendations  Home health PT;Supervision/Assistance - 24 hour     Equipment Recommendations  None recommended by PT    Recommendations for Other Services       Precautions / Restrictions Precautions Precautions: Fall Restrictions Weight Bearing Restrictions: No    Mobility  Bed Mobility Overal bed mobility: Independent                Transfers Overall transfer level: Needs assistance Equipment used: None Transfers: Sit to/from Stand Sit to Stand: Supervision         General transfer comment: supervision due to so many lines/equipment; pt steady when standing and no physical assist needed  Ambulation/Gait Ambulation/Gait assistance: Supervision Gait Distance (Feet): 350 Feet Assistive device: None Gait Pattern/deviations: Step-through pattern;Decreased stride length Gait velocity:  decreased   General Gait Details: vitals WNL and no SOB noted with ambulation; close supervision and assistance for line/equipment management; no LOB with horizontal head turns   Chief Strategy Officer    Modified Rankin (Stroke Patients Only)       Balance Overall balance assessment: No apparent balance deficits (not formally assessed)                                          Cognition Arousal/Alertness: Awake/alert Behavior During Therapy: WFL for tasks assessed/performed Overall Cognitive Status: Within Functional Limits for tasks assessed                                        Exercises      General Comments        Pertinent Vitals/Pain Pain Assessment: No/denies pain    Home Living                      Prior Function            PT Goals (current goals can now be found in the care plan section) Acute Rehab PT Goals Patient Stated Goal: home Independent Progress towards PT goals: Progressing toward goals    Frequency    Min 3X/week      PT Plan Current plan remains appropriate    Co-evaluation  AM-PAC PT "6 Clicks" Mobility   Outcome Measure  Help needed turning from your back to your side while in a flat bed without using bedrails?: None Help needed moving from lying on your back to sitting on the side of a flat bed without using bedrails?: None Help needed moving to and from a bed to a chair (including a wheelchair)?: None Help needed standing up from a chair using your arms (e.g., wheelchair or bedside chair)?: None Help needed to walk in hospital room?: A Little Help needed climbing 3-5 steps with a railing? : A Little 6 Click Score: 22    End of Session Equipment Utilized During Treatment: Gait belt;Oxygen;Other (comment)(3L O2 via [place]) Activity Tolerance: Patient tolerated treatment well Patient left: in bed;with call bell/phone within reach;Other  (comment)(sitting EOB) Nurse Communication: Mobility status PT Visit Diagnosis: Unsteadiness on feet (R26.81);Difficulty in walking, not elsewhere classified (R26.2)     Time: 7793-9030 PT Time Calculation (min) (ACUTE ONLY): 17 min  Charges:  $Gait Training: 8-22 mins                     Earney Navy, PTA Acute Rehabilitation Services Pager: [phone] Office: [phone]     Darliss Cheney 12/02/2018, 1:08 PM

## 2018-12-02 NOTE — Progress Notes (Signed)
ANTICOAGULATION CONSULT NOTE - Follow Up Consult  Pharmacy Consult for Heparin Indication: atrial fibrillation  Allergies  Allergen Reactions  . Vancomycin Itching and Rash   Patient Measurements: Height: 6' (182.9 cm) Weight: 179 lb 7.3 oz (81.4 kg) IBW/kg (Calculated) : 77.6 Heparin Dosing Weight: 85.3 kg  Vital Signs: Temp: 97.7 F (36.5 C) (02/29 1100) Temp Source: Oral (02/29 1100) BP: 149/68 (02/29 1353) Pulse Rate: 84 (02/29 1353)  Labs: Recent Labs    11/30/18 0430  12/01/18 0407 12/01/18 1606 12/01/18 1612 12/02/18 0515 12/02/18 0516 12/02/18 1513  HGB  --   --   --  8.5*  --  8.2*  --   --   HCT  --   --   --  28.5*  --  26.9*  --   --   PLT  --   --   --  203  --  177  --   --   APTT  --   --   --  41*  --   --   --   --   LABPROT 22.5*  --  18.2*  --   --  15.9*  --   --   INR 2.0*  --  1.5*  --   --  1.3*  --   --   HEPARINUNFRC  --    < > 0.16*  --  0.16*  --  0.17* 0.28*  CREATININE 1.84*  --  1.65*  --   --  1.71*  --   --    < > = values in this interval not displayed.    Estimated Creatinine Clearance: 49.2 mL/min (A) (by C-G formula based on SCr of 1.71 mg/dL (H)).   Assessment:  Anticoag: Warfarin PTA  for Afib, now hold start hep when INR < 2 -INR 2.66>> 2.9>> 4.1>2 so IV heparin started with rapid fall in INR. HL <0.1 undetectable.  HL this afternoon remains subtherapeutic but up to 0.28. RN reports no s/s of bleeding.   Goal of Therapy:  Heparin level 0.3-0.7 units/ml Monitor platelets by anticoagulation protocol: Yes   Plan:  Inc heparin to 1800 units/hr Re-check heparin level in 6 hours  Albertina Parr, PharmD., BCPS Clinical Pharmacist Clinical phone for 12/02/18 until 10:30pm: [phone]

## 2018-12-02 NOTE — Progress Notes (Signed)
ANTICOAGULATION CONSULT NOTE - Follow Up Consult  Pharmacy Consult for Heparin Indication: atrial fibrillation  Allergies  Allergen Reactions  . Vancomycin Itching and Rash   Patient Measurements: Height: 6' (182.9 cm) Weight: 179 lb 7.3 oz (81.4 kg) IBW/kg (Calculated) : 77.6 Heparin Dosing Weight: 85.3 kg  Vital Signs: Temp: 97.8 F (36.6 C) (02/29 1900) Temp Source: Oral (02/29 1900) BP: 146/66 (02/29 1900) Pulse Rate: 83 (02/29 1900)  Labs: Recent Labs    11/30/18 0430  12/01/18 0407 12/01/18 1606  12/02/18 0515 12/02/18 0516 12/02/18 1513 12/02/18 2229  HGB  --   --   --  8.5*  --  8.2*  --   --   --   HCT  --   --   --  28.5*  --  26.9*  --   --   --   PLT  --   --   --  203  --  177  --   --   --   APTT  --   --   --  41*  --   --   --   --   --   LABPROT 22.5*  --  18.2*  --   --  15.9*  --   --   --   INR 2.0*  --  1.5*  --   --  1.3*  --   --   --   HEPARINUNFRC  --    < > 0.16*  --    < >  --  0.17* 0.28* 0.47  CREATININE 1.84*  --  1.65*  --   --  1.71*  --   --   --    < > = values in this interval not displayed.    Estimated Creatinine Clearance: 49.2 mL/min (A) (by C-G formula based on SCr of 1.71 mg/dL (H)).   Assessment:  Anticoag: Warfarin PTA  for Afib, now hold start hep when INR < 2 -INR 2.66>> 2.9>> 4.1>2 so IV heparin started with rapid fall in INR. HL <0.1 undetectable.  2/29 PM update: heparin level therapeutic x 1 after rate increase  Goal of Therapy:  Heparin level 0.3-0.7 units/ml Monitor platelets by anticoagulation protocol: Yes   Plan:  Cont heparin at 1800 units/hr Confirmatory heparin level with AM labs  Narda Bonds, PharmD, [place] Pharmacist Phone: [phone]

## 2018-12-03 ENCOUNTER — Inpatient Hospital Stay (HOSPITAL_COMMUNITY): Payer: Medicare HMO

## 2018-12-03 DIAGNOSIS — E43 Unspecified severe protein-calorie malnutrition: Secondary | ICD-10-CM

## 2018-12-03 LAB — CBC WITH DIFFERENTIAL/PLATELET
Abs Immature Granulocytes: 0.01 10*3/uL (ref 0.00–0.07)
Basophils Absolute: 0 10*3/uL (ref 0.0–0.1)
Basophils Relative: 1 %
Eosinophils Absolute: 0.2 10*3/uL (ref 0.0–0.5)
Eosinophils Relative: 5 %
HCT: 25.5 % — ABNORMAL LOW (ref 39.0–52.0)
Hemoglobin: 7.6 g/dL — ABNORMAL LOW (ref 13.0–17.0)
Immature Granulocytes: 0 %
Lymphocytes Relative: 19 %
Lymphs Abs: 0.7 10*3/uL (ref 0.7–4.0)
MCH: 28.8 pg (ref 26.0–34.0)
MCHC: 29.8 g/dL — ABNORMAL LOW (ref 30.0–36.0)
MCV: 96.6 fL (ref 80.0–100.0)
MONOS PCT: 8 %
Monocytes Absolute: 0.3 10*3/uL (ref 0.1–1.0)
Neutro Abs: 2.4 10*3/uL (ref 1.7–7.7)
Neutrophils Relative %: 67 %
Platelets: 146 10*3/uL — ABNORMAL LOW (ref 150–400)
RBC: 2.64 MIL/uL — ABNORMAL LOW (ref 4.22–5.81)
RDW: 14.1 % (ref 11.5–15.5)
WBC: 3.6 10*3/uL — ABNORMAL LOW (ref 4.0–10.5)
nRBC: 0 % (ref 0.0–0.2)

## 2018-12-03 LAB — BASIC METABOLIC PANEL
Anion gap: 8 (ref 5–15)
BUN: 38 mg/dL — AB (ref 8–23)
CO2: 34 mmol/L — ABNORMAL HIGH (ref 22–32)
Calcium: 8.1 mg/dL — ABNORMAL LOW (ref 8.9–10.3)
Chloride: 92 mmol/L — ABNORMAL LOW (ref 98–111)
Creatinine, Ser: 1.56 mg/dL — ABNORMAL HIGH (ref 0.61–1.24)
GFR calc Af Amer: 54 mL/min — ABNORMAL LOW (ref >60)
GFR calc non Af Amer: 47 mL/min — ABNORMAL LOW (ref >60)
GLUCOSE: 264 mg/dL — AB (ref 70–99)
Potassium: 3.9 mmol/L (ref 3.5–5.1)
SODIUM: 134 mmol/L — AB (ref 135–145)

## 2018-12-03 LAB — GLUCOSE, CAPILLARY
GLUCOSE-CAPILLARY: 225 mg/dL — AB (ref 70–99)
Glucose-Capillary: 245 mg/dL — ABNORMAL HIGH (ref 70–99)
Glucose-Capillary: 261 mg/dL — ABNORMAL HIGH (ref 70–99)
Glucose-Capillary: 312 mg/dL — ABNORMAL HIGH (ref 70–99)

## 2018-12-03 LAB — PROTIME-INR
INR: 1.2 (ref 0.8–1.2)
Prothrombin Time: 15 seconds (ref 11.4–15.2)

## 2018-12-03 LAB — LUPUS ANTICOAGULANT PANEL
DRVVT: 40.6 s (ref 0.0–47.0)
PTT Lupus Anticoagulant: 59.6 s — ABNORMAL HIGH (ref 0.0–51.9)

## 2018-12-03 LAB — HEPARIN LEVEL (UNFRACTIONATED)
HEPARIN UNFRACTIONATED: 1.05 [IU]/mL — AB (ref 0.30–0.70)
Heparin Unfractionated: 0.36 IU/mL (ref 0.30–0.70)

## 2018-12-03 LAB — COOXEMETRY PANEL
Carboxyhemoglobin: 2 % — ABNORMAL HIGH (ref 0.5–1.5)
METHEMOGLOBIN: 1.6 % — AB (ref 0.0–1.5)
O2 Saturation: 77.5 %
Total hemoglobin: 7.6 g/dL — ABNORMAL LOW (ref 12.0–16.0)

## 2018-12-03 LAB — HEXAGONAL PHASE PHOSPHOLIPID: Hexagonal Phase Phospholipid: 0 s (ref 0–11)

## 2018-12-03 LAB — PTT-LA MIX: PTT-LA MIX: 50.8 s — AB (ref 0.0–48.9)

## 2018-12-03 MED ORDER — ASPIRIN 81 MG PO CHEW
81.0000 mg | CHEWABLE_TABLET | ORAL | Status: AC
  Administered 2018-12-04: 81 mg via ORAL
  Filled 2018-12-03: qty 1

## 2018-12-03 MED ORDER — TECHNETIUM TC 99M DIETHYLENETRIAME-PENTAACETIC ACID
29.1000 | Freq: Once | INTRAVENOUS | Status: AC | PRN
  Administered 2018-12-03: 29.1 via RESPIRATORY_TRACT

## 2018-12-03 MED ORDER — SODIUM CHLORIDE 0.9 % IV SOLN: 250.0000 mL | INTRAVENOUS | Status: DC | PRN

## 2018-12-03 MED ORDER — SODIUM CHLORIDE 0.9% FLUSH
3.0000 mL | INTRAVENOUS | Status: DC | PRN
  Administered 2018-12-03: 3 mL via INTRAVENOUS
  Filled 2018-12-03: qty 3

## 2018-12-03 MED ORDER — TECHNETIUM TO 99M ALBUMIN AGGREGATED
4.0100 | Freq: Once | INTRAVENOUS | Status: AC | PRN
  Administered 2018-12-03: 4.01 via INTRAVENOUS

## 2018-12-03 MED ORDER — SODIUM CHLORIDE 0.9 % IV SOLN
INTRAVENOUS | Status: DC
  Administered 2018-12-03: 23:00:00 via INTRAVENOUS

## 2018-12-03 MED ORDER — SODIUM CHLORIDE 0.9% FLUSH: 3.0000 mL | Freq: Two times a day (BID) | INTRAVENOUS | Status: DC

## 2018-12-03 NOTE — Progress Notes (Addendum)
Advanced Heart Failure Rounding Note  PCP-Cardiologist: Carlyle Dolly, MD   Subjective:    Remains on milrinone 0.25 mcg/kg/min. Co-ox 78%. Lasix gtt stopped 2/29 and po torsemide started. Says he has good urine output. No weight on chart.   CVP 6 today. Creatinine improved 1.7 -> 1.5. Feeling better. No orthopnea or PND. No CP.   Says he has discussed things with his family and does not want to proceed with VAD.    PFTs 12/01/18 FEV1 1.33 (35%) FVC   1.75 (34%) Ratio 76% DLCO 33%  Seen by Dr. Ron Agee 2/29. Felt not to be candidate for VAD at this point due to Pulmonary status   Objective:   Weight Range: 81.4 kg Body mass index is 24.34 kg/m.   Vital Signs:   Temp:  [97.3 F (36.3 C)-98.3 F (36.8 C)] 97.3 F (36.3 C) (03/01 1200) Pulse Rate:  [83-100] 91 (03/01 1422) Resp:  [17-30] 24 (03/01 1422) BP: (121-146)/(45-68) 143/65 (03/01 1200) SpO2:  [92 %-99 %] 92 % (03/01 1422) FiO2 (%):  [32 %] 32 % (03/01 0754) Last BM Date: 12/03/18  Weight change: Filed Weights   11/30/18 0620 12/01/18 0500 12/02/18 0323  Weight: 85.3 kg 82 kg 81.4 kg    Intake/Output:   Intake/Output Summary (Last 24 hours) at 12/03/2018 1736 Last data filed at 12/03/2018 1728 Gross per 24 hour  Intake 1950.32 ml  Output 1575 ml  Net 375.32 ml     Physical Exam    General:  Sitting up in bed  No resp difficulty HEENT: normal Neck: supple. JVP 6 Carotids 2+ bilat; no bruits. No lymphadenopathy or thryomegaly appreciated. Cor: PMI laterally displaced. Regular rate & rhythm. No rubs, gallops or murmurs. Lungs: clear Abdomen: soft, nontender, nondistended. No hepatosplenomegaly. No bruits or masses. Good bowel sounds. Extremities: no cyanosis, clubbing, rash, 1-2+ edema + UNNA boots. + PICC Neuro: alert & orientedx3, cranial nerves grossly intact. moves all 4 extremities w/o difficulty. Affect pleasant  Telemetry   NSR V paced 80-90s, personally reviewed.   EKG    No new  tracings.    Labs    CBC Recent Labs    12/02/18 0515 12/03/18 0511  WBC 4.6 3.6*  NEUTROABS 3.3 2.4  HGB 8.2* 7.6*  HCT 26.9* 25.5*  MCV 96.8 96.6  PLT 177 169*   Basic Metabolic Panel Recent Labs    12/02/18 0515 12/03/18 0511  NA 133* 134*  K 4.1 3.9  CL 91* 92*  CO2 34* 34*  GLUCOSE 293* 264*  BUN 43* 38*  CREATININE 1.71* 1.56*  CALCIUM 8.5* 8.1*   Liver Function Tests No results for input(s): AST, ALT, ALKPHOS, BILITOT, PROT, ALBUMIN in the last 72 hours. No results for input(s): LIPASE, AMYLASE in the last 72 hours. Cardiac Enzymes No results for input(s): CKTOTAL, CKMB, CKMBINDEX, TROPONINI in the last 72 hours.  BNP: BNP (last 3 results) Recent Labs    09/04/18 1517 10/16/18 1536 11/26/18 1645  BNP 605.0* 2,147.0* 1,629.0*   ProBNP (last 3 results) No results for input(s): PROBNP in the last 8760 hours.  D-Dimer No results for input(s): DDIMER in the last 72 hours. Hemoglobin A1C Recent Labs    12/01/18 1606  HGBA1C 7.7*   Fasting Lipid Panel Recent Labs    12/01/18 1606  CHOL 115  HDL 59  LDLCALC 36  TRIG 100  CHOLHDL 1.9   Thyroid Function Tests No results for input(s): TSH, T4TOTAL, T3FREE, THYROIDAB in the last 72 hours.  Invalid input(s): FREET3  Other results:  Imaging   Dg Chest 2 View  Result Date: 12/03/2018 CLINICAL DATA:  Follow-up pleural effusion. Congestive heart failure. EXAM: CHEST - 2 VIEW COMPARISON:  11/28/2018 FINDINGS: The heart size and mediastinal contours are within normal limits. Pacemaker and right arm PICC line remain in appropriate position. No pneumothorax visualized. Improved aeration of both lungs is seen with decreased atelectasis or infiltrates in both lung bases. Persistent small bilateral pleural effusions are noted. Diffuse interstitial infiltrates are stable consistent with mild interstitial edema. Both lungs are clear. The visualized skeletal structures are unremarkable. IMPRESSION: 1. Improved  aeration of both lungs with decreased atelectasis or infiltrates in both lung bases. 2. Persistent small bilateral pleural effusions. 3. Stable mild diffuse interstitial edema pattern. Electronically Signed   By: Earle Gell M.D.   On: 12/03/2018 09:45   Nm Pulmonary Perf And Vent  Result Date: 12/03/2018 CLINICAL DATA:  63 year old male with acute shortness of breath. Bilateral pleural effusions. EXAM: NUCLEAR MEDICINE VENTILATION - PERFUSION LUNG SCAN TECHNIQUE: Ventilation images were obtained in multiple projections using inhaled aerosol Tc-36m DTPA. Perfusion images were obtained in multiple projections after intravenous injection of Tc-40m MAA. RADIOPHARMACEUTICALS:  29.1 mCi of Tc-75m DTPA aerosol inhalation and 4.0 mCi Tc45m MAA IV COMPARISON:  12/03/2018 and prior chest radiographs FINDINGS: Matched ventilation/perfusion defects from moderate pleural effusions noted. No other perfusion or ventilation defects noted. IMPRESSION: Moderate sized pleural effusions without other perfusion abnormalities corresponding to a low probability for pulmonary embolus (10-19%). Electronically Signed   By: Margarette Canada M.D.   On: 12/03/2018 11:26    Medications:    Scheduled Medications: . feeding supplement (ENSURE ENLIVE)  237 mL Oral BID BM  . ferrous sulfate  325 mg Oral BID  . hydrALAZINE  50 mg Oral Q8H  . insulin aspart  0-20 Units Subcutaneous TID WC  . insulin aspart  0-5 Units Subcutaneous QHS  . insulin aspart  4 Units Subcutaneous TID WC  . insulin glargine  12 Units Subcutaneous Daily  . isosorbide mononitrate  60 mg Oral Daily  . levalbuterol  1.25 mg Nebulization TID  . magnesium oxide  400 mg Oral BID  . mouth rinse  15 mL Mouth Rinse BID  . mometasone-formoterol  2 puff Inhalation BID  . pantoprazole  40 mg Oral QAC breakfast  . primidone  50 mg Oral Daily  . sertraline  50 mg Oral Daily  . simvastatin  40 mg Oral QHS  . sodium chloride flush  10-40 mL Intracatheter Q12H  .  spironolactone  12.5 mg Oral Daily  . torsemide  60 mg Oral BID    Infusions: . heparin 1,800 Units/hr (12/02/18 2126)  . milrinone 0.25 mcg/kg/min (12/03/18 1317)    PRN Medications: acetaminophen **OR** acetaminophen, albuterol, nitroGLYCERIN, sodium chloride flush, sodium chloride flush  Patient Profile   SAMULE LIFE is a 63 y.o. male  with h/o Paroxysmal Afib, CAD, OA of both hands, chronic combined systolic/diastolic CHF, CHB s/p [employer] BiV ICD, Carpal Tunnel syndrome, h/o Colon CA, COPD, DM2, peripheral neuropathy, ETOH abuse, GERD, HLD, HTN, and h/o tobacco abuse.   Admitted 11/26/2018 with worsening DOE and edema despite adjustment of outpatient diuretics. Moved to [facility] 11/29/18 with low output by PICC line.   Assessment/Plan   1. Acute on chronic systolic CHF: Ischemic cardiomyopathy. [employer] CRT-D device.  Most recent echo in 12/19 with EF 20-25%, moderately dilated RV with mildly decreased systolic function, mild AS, moderate MR,  moderate TR, PASP 50 mmHg.  PICC in place, co-ox suggested low output at 49%.  He remains markedly volume overloaded. Breathing improved.  Coox 77% on milrinone 0.25 mcg/kg/min.  - Will decrease milrinone to 0.125 - Still with significant peripheral edema though CVP only 5-6 (checked personally). Now off IV lasix and on po torsemide. Hopefully will continue to diurese slowly. UA with mild amount of protein but unlikely nephrotic range. Albumin 3.0 - Will continue torsemide 60 mg BID hopefully will continue to diurese steadily without exceeding capillary refill rate.  - Continue hydralazine at 50 mg TID and Imdur 60 mg daily. Can attempt to transition to losartan/Entresto if renal function stable post cath.  - Continue spiro 12.5.  - Seen by Dr. Ron Agee 2/29 Felt not to be candidate for VAD at this point due to Pulmonary status.  - Patient has discussed with his family and says he is now not interested in VAD even if it means he will die  without it - Plan [facility] tomorrow   2. CAD: Anterior MI in 2006 with PCI to LAD.  DES for in-stent restenosis in 2007.  No cath/intervention since that time.  - No s/s of ischemia.    - Given warfarin anticoagulation, he is not on ASA. Currently on heparin - Continue Zocor.  - Cath tomorrow  3. AKI on CKD stage 3: - Cr 1.7 -> 1.84 -> 1.65 -> 1.7 -> 1.56. Suspect cardiorenal syndrome.   4. COPD: He recently quit smoking.  Severe COPD by PFTs on 2/28 - PFTs  2/28.FEV1 1.33 (35%) DLCO 33% - Off prednisone. No wheeze. Suspect primarily CHF.  5. Type II diabetes:  - SSI while in house. Should improve off prednisone.  6. Atrial fibrillation: Paroxysmal.  - Appears to be in NSR - Holding coumadin to allow INR to trend down for cath. INR 1.3. Covering with heparin. No bleeding but hgb trending down slowly to 7.6. Will get T&S in am - Discussed heparin dosing with PharmD personally. 7. Severe protein-calorie malnutrition - encouraged po intake - start Ensure    Length of Stay: 6  Glori Bickers, MD  12/03/2018, 5:36 PM  Advanced Heart Failure Team Pager [phone] (M-F; 7a - 4p)  Please contact [facility] for night-coverage after hours (4p -7a ) and weekends on [email or web address]

## 2018-12-03 NOTE — Plan of Care (Signed)
  Problem: Education: Goal: Knowledge of General Education information will improve Description Including pain rating scale, medication(s)/side effects and non-pharmacologic comfort measures Outcome: Progressing   Problem: Health Behavior/Discharge Planning: Goal: Ability to manage health-related needs will improve Outcome: Progressing   Problem: Clinical Measurements: Goal: Ability to maintain clinical measurements within normal limits will improve Outcome: Progressing Goal: Will remain free from infection Outcome: Progressing Goal: Diagnostic test results will improve Outcome: Progressing Goal: Respiratory complications will improve Outcome: Progressing Goal: Cardiovascular complication will be avoided Outcome: Progressing   Problem: Activity: Goal: Risk for activity intolerance will decrease Outcome: Progressing   Problem: Nutrition: Goal: Adequate nutrition will be maintained Outcome: Progressing   Problem: Coping: Goal: Level of anxiety will decrease Outcome: Progressing   Problem: Elimination: Goal: Will not experience complications related to bowel motility Outcome: Progressing Goal: Will not experience complications related to urinary retention Outcome: Progressing   Problem: Pain Managment: Goal: General experience of comfort will improve Outcome: Progressing   Problem: Safety: Goal: Ability to remain free from injury will improve Outcome: Progressing   Problem: Education: Goal: Ability to demonstrate management of disease process will improve Outcome: Progressing Goal: Ability to verbalize understanding of medication therapies will improve Outcome: Progressing Goal: Individualized Educational Video(s) Outcome: Progressing   Problem: Activity: Goal: Capacity to carry out activities will improve Outcome: Progressing   Problem: Cardiac: Goal: Ability to achieve and maintain adequate cardiopulmonary perfusion will improve Outcome: Progressing   

## 2018-12-03 NOTE — Progress Notes (Signed)
ANTICOAGULATION CONSULT NOTE - Follow Up Consult  Pharmacy Consult for Heparin Indication: atrial fibrillation  Allergies  Allergen Reactions  . Vancomycin Itching and Rash   Patient Measurements: Height: 6' (182.9 cm) Weight: 179 lb 7.3 oz (81.4 kg) IBW/kg (Calculated) : 77.6 Heparin Dosing Weight: 85.3 kg  Vital Signs: Temp: 97.3 F (36.3 C) (03/01 1200) Temp Source: Oral (03/01 1200) BP: 143/65 (03/01 1200) Pulse Rate: 91 (03/01 1422)  Labs: Recent Labs    12/01/18 0407  12/01/18 1606  12/02/18 0515  12/02/18 2229 12/03/18 0511 12/03/18 0740  HGB  --    < > 8.5*  --  8.2*  --   --  7.6*  --   HCT  --   --  28.5*  --  26.9*  --   --  25.5*  --   PLT  --   --  203  --  177  --   --  146*  --   APTT  --   --  41*  --   --   --   --   --   --   LABPROT 18.2*  --   --   --  15.9*  --   --  15.0  --   INR 1.5*  --   --   --  1.3*  --   --  1.2  --   HEPARINUNFRC 0.16*  --   --    < >  --    < > 0.47 1.05* 0.36  CREATININE 1.65*  --   --   --  1.71*  --   --  1.56*  --    < > = values in this interval not displayed.    Estimated Creatinine Clearance: 53.9 mL/min (A) (by C-G formula based on SCr of 1.56 mg/dL (H)).  . heparin 1,800 Units/hr (12/02/18 2126)  . milrinone 0.25 mcg/kg/min (12/03/18 1317)     Assessment:  Anticoag: Warfarin PTA  for Afib, now hold start hep when INR < 2 -INR 2.66>> 2.9>> 4.1>2 so IV heparin started with rapid fall in INR. HL <0.1 undetectable.  Heparin level drawn from line where infusing this morning and was falsely elevated.  Within goal range on redraw.  No overt bleeding or complications noted.   Goal of Therapy:  Heparin level 0.3-0.7 units/ml Monitor platelets by anticoagulation protocol: Yes   Plan:  Cont heparin at 1800 units/hr Daily heparin level and CBC. F/u ability to resume po anticoagulation eventually after cath.  Marguerite Olea, [facility] Clinical Pharmacist Phone [phone]  12/03/2018 2:51  PM

## 2018-12-04 ENCOUNTER — Inpatient Hospital Stay (HOSPITAL_COMMUNITY): Payer: Medicare HMO

## 2018-12-04 ENCOUNTER — Encounter (HOSPITAL_COMMUNITY)
Admission: EM | Disposition: A | Discharge status: Hospice | Payer: Self-pay | Source: Home / Self Care | Attending: Cardiology

## 2018-12-04 ENCOUNTER — Ambulatory Visit (HOSPITAL_COMMUNITY): Admit: 2018-12-04 | Payer: Medicare HMO | Admitting: Cardiology

## 2018-12-04 ENCOUNTER — Encounter (HOSPITAL_COMMUNITY): Payer: Self-pay | Admitting: Pulmonary Disease

## 2018-12-04 DIAGNOSIS — Z720 Tobacco use: Secondary | ICD-10-CM

## 2018-12-04 DIAGNOSIS — J9601 Acute respiratory failure with hypoxia: Secondary | ICD-10-CM

## 2018-12-04 DIAGNOSIS — J441 Chronic obstructive pulmonary disease with (acute) exacerbation: Secondary | ICD-10-CM

## 2018-12-04 DIAGNOSIS — J449 Chronic obstructive pulmonary disease, unspecified: Secondary | ICD-10-CM

## 2018-12-04 LAB — COOXEMETRY PANEL
Carboxyhemoglobin: 1.9 % — ABNORMAL HIGH (ref 0.5–1.5)
Methemoglobin: 1.6 % — ABNORMAL HIGH (ref 0.0–1.5)
O2 Saturation: 74.3 %
Total hemoglobin: 7.8 g/dL — ABNORMAL LOW (ref 12.0–16.0)

## 2018-12-04 LAB — GLUCOSE, CAPILLARY
Glucose-Capillary: 179 mg/dL — ABNORMAL HIGH (ref 70–99)
Glucose-Capillary: 220 mg/dL — ABNORMAL HIGH (ref 70–99)
Glucose-Capillary: 309 mg/dL — ABNORMAL HIGH (ref 70–99)
Glucose-Capillary: 336 mg/dL — ABNORMAL HIGH (ref 70–99)

## 2018-12-04 LAB — URINALYSIS, ROUTINE W REFLEX MICROSCOPIC
Bacteria, UA: NONE SEEN
Bilirubin Urine: NEGATIVE
Glucose, UA: NEGATIVE mg/dL
Ketones, ur: NEGATIVE mg/dL
Leukocytes,Ua: NEGATIVE
Nitrite: NEGATIVE
Protein, ur: 100 mg/dL — AB
Specific Gravity, Urine: 1.006 (ref 1.005–1.030)
pH: 7 (ref 5.0–8.0)

## 2018-12-04 LAB — RAPID URINE DRUG SCREEN, HOSP PERFORMED
AMPHETAMINES: NOT DETECTED
Barbiturates: POSITIVE — AB
Benzodiazepines: NOT DETECTED
Cocaine: NOT DETECTED
Opiates: NOT DETECTED
Tetrahydrocannabinol: NOT DETECTED

## 2018-12-04 LAB — CBC WITH DIFFERENTIAL/PLATELET
Abs Immature Granulocytes: 0.02 10*3/uL (ref 0.00–0.07)
Basophils Absolute: 0 10*3/uL (ref 0.0–0.1)
Basophils Relative: 1 %
Eosinophils Absolute: 0.3 10*3/uL (ref 0.0–0.5)
Eosinophils Relative: 6 %
HCT: 25.6 % — ABNORMAL LOW (ref 39.0–52.0)
Hemoglobin: 7.9 g/dL — ABNORMAL LOW (ref 13.0–17.0)
Immature Granulocytes: 0 %
Lymphocytes Relative: 12 %
Lymphs Abs: 0.6 10*3/uL — ABNORMAL LOW (ref 0.7–4.0)
MCH: 29.9 pg (ref 26.0–34.0)
MCHC: 30.9 g/dL (ref 30.0–36.0)
MCV: 97 fL (ref 80.0–100.0)
Monocytes Absolute: 0.4 10*3/uL (ref 0.1–1.0)
Monocytes Relative: 8 %
Neutro Abs: 4 10*3/uL (ref 1.7–7.7)
Neutrophils Relative %: 73 %
Platelets: 169 10*3/uL (ref 150–400)
RBC: 2.64 MIL/uL — ABNORMAL LOW (ref 4.22–5.81)
RDW: 14.2 % (ref 11.5–15.5)
WBC: 5.4 10*3/uL (ref 4.0–10.5)
nRBC: 0 % (ref 0.0–0.2)

## 2018-12-04 LAB — BASIC METABOLIC PANEL
Anion gap: 7 (ref 5–15)
BUN: 40 mg/dL — ABNORMAL HIGH (ref 8–23)
CHLORIDE: 93 mmol/L — AB (ref 98–111)
CO2: 34 mmol/L — ABNORMAL HIGH (ref 22–32)
Calcium: 8.5 mg/dL — ABNORMAL LOW (ref 8.9–10.3)
Creatinine, Ser: 1.65 mg/dL — ABNORMAL HIGH (ref 0.61–1.24)
GFR calc Af Amer: 51 mL/min — ABNORMAL LOW (ref >60)
GFR calc non Af Amer: 44 mL/min — ABNORMAL LOW (ref >60)
Glucose, Bld: 201 mg/dL — ABNORMAL HIGH (ref 70–99)
Potassium: 4.4 mmol/L (ref 3.5–5.1)
Sodium: 134 mmol/L — ABNORMAL LOW (ref 135–145)

## 2018-12-04 LAB — FERRITIN: Ferritin: 88 ng/mL (ref 24–336)

## 2018-12-04 LAB — PROTIME-INR
INR: 1.2 (ref 0.8–1.2)
Prothrombin Time: 15.3 s — ABNORMAL HIGH (ref 11.4–15.2)

## 2018-12-04 LAB — IRON AND TIBC
Iron: 116 ug/dL (ref 45–182)
Saturation Ratios: 35 % (ref 17.9–39.5)
TIBC: 332 ug/dL (ref 250–450)
UIBC: 216 ug/dL

## 2018-12-04 LAB — HEPARIN LEVEL (UNFRACTIONATED): Heparin Unfractionated: >2.2 IU/mL — ABNORMAL HIGH (ref 0.30–0.70)

## 2018-12-04 LAB — VITAMIN B12: Vitamin B-12: 358 pg/mL (ref 180–914)

## 2018-12-04 SURGERY — INVASIVE LAB ABORTED CASE

## 2018-12-04 MED ORDER — FUROSEMIDE 10 MG/ML IJ SOLN
80.0000 mg | Freq: Two times a day (BID) | INTRAMUSCULAR | Status: DC
  Administered 2018-12-04 – 2018-12-06 (×4): 80 mg via INTRAVENOUS
  Filled 2018-12-04 (×4): qty 8

## 2018-12-04 MED ORDER — ASPIRIN 81 MG PO CHEW
81.0000 mg | CHEWABLE_TABLET | ORAL | Status: AC
  Administered 2018-12-05: 81 mg via ORAL
  Filled 2018-12-04: qty 1

## 2018-12-04 MED ORDER — METHYLPREDNISOLONE SODIUM SUCC 125 MG IJ SOLR
60.0000 mg | Freq: Two times a day (BID) | INTRAMUSCULAR | Status: DC
  Administered 2018-12-04: 60 mg via INTRAVENOUS
  Filled 2018-12-04: qty 2

## 2018-12-04 MED ORDER — FUROSEMIDE 10 MG/ML IJ SOLN
20.0000 mg | Freq: Once | INTRAMUSCULAR | Status: AC
  Administered 2018-12-04: 20 mg via INTRAVENOUS

## 2018-12-04 MED ORDER — SPIRONOLACTONE 25 MG PO TABS
25.0000 mg | ORAL_TABLET | Freq: Every day | ORAL | Status: DC
  Administered 2018-12-04 – 2018-12-07 (×4): 25 mg via ORAL
  Filled 2018-12-04 (×4): qty 1

## 2018-12-04 MED ORDER — IPRATROPIUM-ALBUTEROL 0.5-2.5 (3) MG/3ML IN SOLN
RESPIRATORY_TRACT | Status: AC
  Filled 2018-12-04: qty 3

## 2018-12-04 MED ORDER — SODIUM CHLORIDE 0.9% FLUSH: 3.0000 mL | INTRAVENOUS | Status: DC | PRN

## 2018-12-04 MED ORDER — MILRINONE LACTATE IN DEXTROSE 20-5 MG/100ML-% IV SOLN
0.2500 ug/kg/min | INTRAVENOUS | Status: DC
  Administered 2018-12-04 (×2): 0.25 ug/kg/min via INTRAVENOUS
  Filled 2018-12-04 (×2): qty 100

## 2018-12-04 MED ORDER — SODIUM CHLORIDE 0.9 % IV SOLN
INTRAVENOUS | Status: DC
  Administered 2018-12-05: 11:00:00 via INTRAVENOUS

## 2018-12-04 MED ORDER — INSULIN GLARGINE 100 UNIT/ML ~~LOC~~ SOLN
24.0000 [IU] | Freq: Every day | SUBCUTANEOUS | Status: DC
  Administered 2018-12-05 – 2018-12-08 (×4): 24 [IU] via SUBCUTANEOUS
  Filled 2018-12-04 (×4): qty 0.24

## 2018-12-04 MED ORDER — BUDESONIDE 0.5 MG/2ML IN SUSP
0.5000 mg | Freq: Two times a day (BID) | RESPIRATORY_TRACT | Status: DC
  Administered 2018-12-04 – 2018-12-08 (×9): 0.5 mg via RESPIRATORY_TRACT
  Filled 2018-12-04 (×9): qty 2

## 2018-12-04 MED ORDER — FUROSEMIDE 10 MG/ML IJ SOLN
INTRAMUSCULAR | Status: AC
  Filled 2018-12-04: qty 2

## 2018-12-04 MED ORDER — METHYLPREDNISOLONE SODIUM SUCC 40 MG IJ SOLR
40.0000 mg | Freq: Every day | INTRAMUSCULAR | Status: DC
  Administered 2018-12-05 – 2018-12-08 (×4): 40 mg via INTRAVENOUS
  Filled 2018-12-04 (×4): qty 1

## 2018-12-04 MED ORDER — FUROSEMIDE 10 MG/ML IJ SOLN
INTRAMUSCULAR | Status: AC
  Filled 2018-12-04: qty 8

## 2018-12-04 MED ORDER — FUROSEMIDE 10 MG/ML IJ SOLN
INTRAMUSCULAR | Status: DC | PRN
  Administered 2018-12-04: 80 mg via INTRAVENOUS

## 2018-12-04 MED ORDER — SODIUM CHLORIDE 0.9 % IV SOLN: 250.0000 mL | INTRAVENOUS | Status: DC | PRN

## 2018-12-04 MED ORDER — IPRATROPIUM-ALBUTEROL 0.5-2.5 (3) MG/3ML IN SOLN
3.0000 mL | RESPIRATORY_TRACT | Status: AC
  Administered 2018-12-04: 3 mL via RESPIRATORY_TRACT
  Filled 2018-12-04: qty 3

## 2018-12-04 MED ORDER — INSULIN GLARGINE 100 UNIT/ML ~~LOC~~ SOLN
18.0000 [IU] | Freq: Every day | SUBCUTANEOUS | Status: DC
  Administered 2018-12-04: 18 [IU] via SUBCUTANEOUS
  Filled 2018-12-04: qty 0.18

## 2018-12-04 MED ORDER — IPRATROPIUM BROMIDE 0.02 % IN SOLN
0.5000 mg | Freq: Three times a day (TID) | RESPIRATORY_TRACT | Status: DC
  Administered 2018-12-04 – 2018-12-08 (×14): 0.5 mg via RESPIRATORY_TRACT
  Filled 2018-12-04 (×14): qty 2.5

## 2018-12-04 MED ORDER — ARFORMOTEROL TARTRATE 15 MCG/2ML IN NEBU: 15.0000 ug | INHALATION_SOLUTION | Freq: Two times a day (BID) | RESPIRATORY_TRACT | Status: DC

## 2018-12-04 MED ORDER — BUDESONIDE 0.5 MG/2ML IN SUSP: 0.5000 mg | Freq: Two times a day (BID) | RESPIRATORY_TRACT | Status: DC

## 2018-12-04 MED ORDER — SODIUM CHLORIDE 0.9% FLUSH
3.0000 mL | Freq: Two times a day (BID) | INTRAVENOUS | Status: DC
  Administered 2018-12-05: 3 mL via INTRAVENOUS

## 2018-12-04 MED ORDER — IPRATROPIUM BROMIDE 0.02 % IN SOLN: 0.5000 mg | Freq: Four times a day (QID) | RESPIRATORY_TRACT | Status: DC

## 2018-12-04 MED ORDER — ARFORMOTEROL TARTRATE 15 MCG/2ML IN NEBU
15.0000 ug | INHALATION_SOLUTION | Freq: Two times a day (BID) | RESPIRATORY_TRACT | Status: DC
  Administered 2018-12-04 – 2018-12-08 (×9): 15 ug via RESPIRATORY_TRACT
  Filled 2018-12-04 (×9): qty 2

## 2018-12-04 MED ORDER — INSULIN ASPART 100 UNIT/ML ~~LOC~~ SOLN
6.0000 [IU] | Freq: Three times a day (TID) | SUBCUTANEOUS | Status: DC
  Administered 2018-12-04 – 2018-12-08 (×10): 6 [IU] via SUBCUTANEOUS

## 2018-12-04 SURGICAL SUPPLY — 4 items
KIT HEART LEFT (KITS) ×4 IMPLANT
PACK CARDIAC CATHETERIZATION (CUSTOM PROCEDURE TRAY) ×4 IMPLANT
TRANSDUCER W/STOPCOCK (MISCELLANEOUS) ×4 IMPLANT
TUBING CIL FLEX 10 FLL-RA (TUBING) ×4 IMPLANT

## 2018-12-04 NOTE — Care Management Important Message (Signed)
Important Message  Patient Details  Name: [name] MRN: [id number] Date of Birth: 1956/08/17   Medicare Important Message Given:  Yes    Angelice Piech P Erionna Strum 12/04/2018, 1:21 PM

## 2018-12-04 NOTE — Plan of Care (Signed)

## 2018-12-04 NOTE — Progress Notes (Addendum)
Inpatient Diabetes Program Recommendations  AACE/ADA: New Consensus Statement on Inpatient Glycemic Control (2015)  Target Ranges:  Prepandial:   less than 140 mg/dL      Peak postprandial:   less than 180 mg/dL (1-2 hours)      Critically ill patients:  140 - 180 mg/dL   Lab Results  Component Value Date   GLUCAP 179 (H) 12/04/2018   HGBA1C 7.7 (H) 12/01/2018    Review of Glycemic Control Results for [name], [name] (MRN [id number]) as of 12/04/2018 10:59  Ref. Range 12/03/2018 06:04 12/03/2018 11:15 12/03/2018 16:12 12/03/2018 21:57 12/04/2018 06:44  Glucose-Capillary Latest Ref Range: 70 - 99 mg/dL 225 (H) 245 (H) 261 (H) 312 (H) 179 (H)   Diabetes history: DM 2 Outpatient Diabetes medications:  Lantus 20 units daily Current orders for Inpatient glycemic control:  Lantus 18 units daily, Novolog resistant tid with meals and HS, Novolog 4 units tid with meals  Inpatient Diabetes Program Recommendations:   Note that steroids started this AM.  Anticipate that blood sugars will likely increase. Please consider increasing Lantus to 24 units daily.  Also please increase Novolog to 6 units tid with meals.    Thanks  Adah Perl, RN, BC-ADM Inpatient Diabetes Coordinator Pager [phone] (8a-5p)

## 2018-12-04 NOTE — Consult Note (Addendum)
NAME:  [name], MRN:  [id number], DOB:  Jul 02, 1956, LOS: 7 ADMISSION DATE:  11/26/2018, CONSULTATION DATE:  3/2 REFERRING MD:  Dr. Aundra Dubin, CHIEF COMPLAINT:  Shortness of Breath    Brief History   63 y/o M admitted 2/23 to [facility] with worsening SOB, LE swelling.  Transferred to [facility] 2/26 for evaluation by CHF service.  Pt unable to lie flat for LHC 3/2, PCCM consulted for dyspnea.   History of present illness   63 y/o M who presented 2/23 to [facility] ER via EMS with reports of gradual worsening of shortness of breath.  The patient reported on admit he had noted swelling increasing from his feet to his abdomen.    He was initially admitted per TRH to [facility].  Initial work up notable for AKI with serum creatinine of 1.66 / BUN 31, troponin 0.06, BNP 1629.  CXR demonstrated cardiomegaly, interstitial edema and right pleural effusion.  PICC line placed and diuresis attempted. Initial Co-ox 61% but dropped to 49% 2/26 with low output CHF and was transferred to [facility] 2/26 for further evaluation.   On arrival to [facility] SDU, he was noted to have increased work of breathing and a new O2 need that required NRB.  I/O's were in question.  He was placed on milrinone, heparin gtt.  He was intermittently diuresed.  He was taken to the cath lab for assessment but was unable to lie flat for procedure.  PFT's were evaluated 2/28 with FEV1 1.33 (35%), FVC 1.75 (34%), Ratio 76%, DLCO 33%. Further, the patient was evaluated by CVTS for VAD placement and felt his pulmonary status would preclude sternotomy & VAD implantation.  ECHO 2/29 with LVEF 20-25%, moderate MR, normal RV size / systolic function, PASP 52 mmHg.  CT chest evaluation on 2/29 notable for mild emphysema, atelectasis, R>L pleural effusions.   On 3/2, the patient remained on 4L/[place] and PCCM consulted for evaluation of dyspnea. He currently reports no fevers, sputum production (does not normally produce sputum).  Indicates progressively worsening swelling / SOB brought  him into the hospital.  States he quit smoking 2 weeks ago.  Began smoking age 43, smoked up to 2.5 to 3ppd.  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    Past Medical History  Mixed Ischemic / non-ischemic cardiomyopathy Chronic AF on Coumadin  Subtotal colectomy for large tubular adenoma at ileocecal valve  Chronic diarrhea because of colon resection Barrett's Esophagitis at [employer] junction Former Heavy ETOH, quit 2006  [place] [facility]   2/23  Admit to [facility] with SOB, swelling up to abdomen  2/26  Transferred to [facility] for CHF evaluation  3/02  PCCM consulted for SOB.  Pt unable to lie flat for LHC  Consults:  PCCM 3/2   Procedures:    Significant Diagnostic Tests:  CT Chest 2/29 >> mild cardiomegaly, bilateral effusions with interstitial thickening in the lungs & patchy hazy airspace opacities consistent with pulmonary edema, bilateral lower lobe, RML, LUL lingula atelectasis  CT Abd/Pelvis 2/29 >> no acute findings, trace ascites  Micro Data:    Antimicrobials:     Interim history/subjective:  As above   Objective   Blood pressure (!) 138/59, pulse 96, temperature 98.1 F (36.7 C), temperature source Oral, resp. rate 20, height 6' (1.829 m), weight 81.3 kg, SpO2 94 %. CVP:  [8 mmHg-9 mmHg] 9 mmHg  FiO2 (%):  [32 %-40 %] 40 %   Intake/Output Summary (Last 24 hours) at 12/04/2018 1201 Last  data filed at 12/04/2018 5284 Gross per 24 hour  Intake 1148.54 ml  Output 1025 ml  Net 123.54 ml   Filed Weights   12/01/18 0500 12/02/18 0323 12/04/18 0639  Weight: 82 kg 81.4 kg 81.3 kg    Examination: General: chronically ill appearing male lying in bed in NAD HEENT: MM pink/moist, [place] O2 Neuro: AAOx4, speech clear, MAE CV: s1s2 rrr, no m/r/g PULM: mildly labored at rest but no distress, diminished posterior bases, crackles posterior  XL:KGMW, non-tender, bsx4 active  Extremities: warm/dry, pitting BLE edema, LE's wrapped   Skin: no rashes or  lesions  Resolved Hospital Problem list     Assessment & Plan:   Acute Hypoxemic Respiratory Failure -suspect multifactorial in setting of acute on chronic sCHF/Ischemic cardiomyopathy, LVEF 20-25%.  PFT's recently completed but may be may be falsely low due to acute illness.  He does have a significant smoking history but suprisingly mild emphysema on imaging, no wheezing on exam.  He does have R>L pleural effusions and multiple areas of atelectasis on imaging.  Pulmonary arteries appear enlarged (on my assessment) on CT, ? Component of pulmonary hypertension.      COPD  P: Wean O2 for sats 88-95% Await L/R heart cath  May need BiPAP support to allow for supine positioning for heart cath  Flutter valve / incentive spirometry for atelectasis  Mobilize / OOB Will need ambulatory O2 needs assessment prior to discharge  Hold Dulera, likely not strong enough to adequately inhale  Brovana + Pulmicort BID  Xopenex / atrovent TID > likely can make PRN 3/3 Reduce solumedrol to 40 mg IV QD Diuresis as renal function / BP permit   Bilateral Pleural Effusion  -R>L, small to moderate P: Follow CXR  Monitor effusions with diuresis  No role for thoracentesis at this time  Acute on Chronic sCHF / Ischemic Cardiomyopathy  P: Per CHF Service  Follow co-ox   Best practice:  Diet: Heart Healthy Pain/Anxiety/Delirium protocol (if indicated): n/a VAP protocol (if indicated): n/a DVT prophylaxis: heparin gtt  GI prophylaxis: n/a  Glucose control: per primary  Mobility: as tolerated  Code Status: Full Code  Family Communication: Patient updated on plan of care  Disposition: SDU   Labs   CBC: Recent Labs  Lab 12/01/18 1606 12/02/18 0515 12/03/18 0511 12/04/18 0557  WBC 5.0 4.6 3.6* 5.4  NEUTROABS 3.7 3.3 2.4 4.0  HGB 8.5* 8.2* 7.6* 7.9*  HCT 28.5* 26.9* 25.5* 25.6*  MCV 97.3 96.8 96.6 97.0  PLT 203 177 146* 102    Basic Metabolic Panel: Recent Labs  Lab 11/30/18 0430  12/01/18 0407 12/02/18 0515 12/03/18 0511 12/04/18 0557  NA 132* 133* 133* 134* 134*  K 4.5 3.8 4.1 3.9 4.4  CL 90* 89* 91* 92* 93*  CO2 33* 34* 34* 34* 34*  GLUCOSE 322* 203* 293* 264* 201*  BUN 43* 46* 43* 38* 40*  CREATININE 1.84* 1.65* 1.71* 1.56* 1.65*  CALCIUM 8.6* 8.5* 8.5* 8.1* 8.5*  MG 2.0  --   --   --   --    GFR: Estimated Creatinine Clearance: 50.9 mL/min (A) (by C-G formula based on SCr of 1.65 mg/dL (H)). Recent Labs  Lab 12/01/18 1606 12/02/18 0515 12/03/18 0511 12/04/18 0557  WBC 5.0 4.6 3.6* 5.4    Liver Function Tests: No results for input(s): AST, ALT, ALKPHOS, BILITOT, PROT, ALBUMIN in the last 168 hours. No results for input(s): LIPASE, AMYLASE in the last 168 hours. No results for input(s):  AMMONIA in the last 168 hours.  ABG    Component Value Date/Time   PHART 7.398 12/01/2018 1710   PCO2ART 58.2 (H) 12/01/2018 1710   PO2ART 72.5 (L) 12/01/2018 1710   HCO3 35.2 (H) 12/01/2018 1710   TCO2 28 01/26/2014 0650   ACIDBASEDEF 17.2 (H) 09/04/2018 1515   O2SAT 74.3 12/04/2018 0440     Coagulation Profile: Recent Labs  Lab 11/30/18 0430 12/01/18 0407 12/02/18 0515 12/03/18 0511 12/04/18 0557  INR 2.0* 1.5* 1.3* 1.2 1.2    Cardiac Enzymes: No results for input(s): CKTOTAL, CKMB, CKMBINDEX, TROPONINI in the last 168 hours.  HbA1C: Hgb A1c MFr Bld  Date/Time Value Ref Range Status  12/01/2018 04:06 PM 7.7 (H) 4.8 - 5.6 % Final    Comment:    (NOTE) Pre diabetes:          5.7%-6.4% Diabetes:              >6.4% Glycemic control for   <7.0% adults with diabetes   09/04/2018 03:24 PM 7.3 (H) 4.8 - 5.6 % Final    Comment:    (NOTE) Pre diabetes:          5.7%-6.4% Diabetes:              >6.4% Glycemic control for   <7.0% adults with diabetes     CBG: Recent Labs  Lab 12/03/18 1115 12/03/18 1612 12/03/18 2157 12/04/18 0644 12/04/18 1053  GLUCAP 245* 261* 312* 179* 220*    Review of Systems: Positives in [place]   Gen:  Denies fever, chills, weight change, fatigue, night sweats HEENT: Denies blurred vision, double vision, hearing loss, tinnitus, sinus congestion, rhinorrhea, sore throat, neck stiffness, dysphagia PULM: Denies shortness of breath, cough, sputum production, hemoptysis, wheezing CV: Denies chest pain, edema, orthopnea, paroxysmal nocturnal dyspnea, palpitations GI: Denies abdominal pain, nausea, vomiting, "chronic" diarrhea, hematochezia, melena, constipation, change in bowel habits GU: Denies dysuria, hematuria, polyuria, oliguria, urethral discharge Endocrine: Denies hot or cold intolerance, polyuria, polyphagia or appetite change Derm: Denies rash, dry skin, scaling or peeling skin change Heme: Denies easy bruising, bleeding, bleeding gums Neuro: Denies headache, numbness, weakness, slurred speech, loss of memory or consciousness    Past Medical History  He,  has a past medical history of Arthritis, Biventricular ICD (implantable cardioverter-defibrillator) in place, CAD (coronary artery disease), Cardiomyopathy ([place]), Carpal tunnel syndrome, Chronic systolic (congestive) heart failure ([place]), Colon cancer ([place]) (2006), Complete heart block ([place]), COPD (chronic obstructive pulmonary disease) ([place]), Diabetes mellitus, type II ([place]), H/O alcohol dependence ([place]), H/O hiatal hernia, History of pneumonia (2013), Hyperlipidemia, Hypertension, Myocardial infarction ([place]), Neuropathy in diabetes ([place]), and PAF (paroxysmal atrial fibrillation) ([place]).   Surgical History    Past Surgical History:  Procedure Laterality Date  . ANKLE SURGERY     Right  . Biventricular ICD Placement  02/09/2013   Dr. Lovena Le  . COLONOSCOPY  08/17/2005   Pancolonic diverticula/Multiple rectal polyps removed as described above. The larger of the two likely responsible for intermittent hematochezia/ Multiple polyps at the hepatic flexure resected with a snare./ Large polypoid lesion growing out of the base of the cecum, just  behind the  ileocecal valve. This was not felt to be amendable to endoscopic resection. Biopsied multiple times  . CORONARY ANGIOPLASTY WITH STENT PLACEMENT    . ESOPHAGOGASTRODUODENOSCOPY N/A 03/29/2017   Barrett's esophagus, gastritis, duodenitis, focal glandular atypia on biopsy. Needs surveillance June 2019   . FLEXIBLE SIGMOIDOSCOPY    06/13/2006   Essentially normal residual  rectum status post total colectomy. anastomosis at 25cm.  Focal area of abnormality adjacent to suture, as described above, likely not significant, suspect granulation tissue, biopsied.  Anastomosis otherwise appeared normal.  Distal terminal ileum appeared normal  . FLEXIBLE SIGMOIDOSCOPY N/A 01/13/2015   Dr. Gala Romney: normal-appearing residual rectum, surgical anastomosis, s/p subtotal colectomy. Surveillance 5 years   . FLEXIBLE SIGMOIDOSCOPY N/A 03/29/2017   Dr. Oneida Alar while inpatient: intermittent rectal bleeding due to ischemic erosion at anastomosis,s/p APC therapy. one 3 mm polyp in recum (hyperplastic). surveillance in 3 years  . INGUINAL HERNIA REPAIR    . MULTIPLE EXTRACTIONS WITH ALVEOLOPLASTY N/A 04/09/2013   Procedure: Extraction of tooth #'s 1,2,4,5,6,7,8,9,10,11,12,13,17,18,20,21,22,23,27,28, 29,30,31, and 32 with alveoloplasty.;  Surgeon: Lenn Cal, DDS;  Location: [place];  Service: Oral Surgery;  Laterality: N/A;  . PERMANENT PACEMAKER INSERTION N/A 02/09/2013   Procedure: PERMANENT PACEMAKER INSERTION;  Surgeon: Evans Lance, MD;  Location: [facility] CATH LAB;  Service: Cardiovascular;  Laterality: N/A;  . SUBTOTAL COLECTOMY  09/01/2005   subtotal colectomy  . TEMPORARY PACEMAKER INSERTION Right 02/09/2013   Procedure: TEMPORARY PACEMAKER INSERTION;  Surgeon: Sinclair Grooms, MD;  Location: [facility] CATH LAB;  Service: Cardiovascular;  Laterality: Right;     Social History   reports that he has been smoking cigarettes. He started smoking about 47 years ago. He has a 4.00 pack-year smoking history. He has never  used smokeless tobacco. He reports that he does not drink alcohol or use drugs.   Family History   His family history includes Colon cancer in his mother; Diabetes in his brother; Heart disease in his brother, father, and mother; Hypertension in his brother. There is no history of Liver disease.   Allergies Allergies  Allergen Reactions  . Vancomycin Itching and Rash     Home Medications  Prior to Admission medications   Medication Sig Start Date End Date Taking? Authorizing Provider  acetaminophen (TYLENOL) 500 MG tablet Take 500 mg by mouth every 6 (six) hours as needed for mild pain or moderate pain.  01/08/14  Yes [provider]  albuterol (PROVENTIL) (2.5 MG/3ML) 0.083% nebulizer solution Take 2.5 mg by nebulization every 6 (six) hours as needed. For shortness of breath   Yes [provider]  ferrous sulfate 325 (65 FE) MG EC tablet Take 325 mg by mouth 2 (two) times daily.   Yes [provider]  insulin glargine (LANTUS) 100 UNIT/ML injection Inject 20 Units into the skin daily as needed (for high blood sugar).    Yes [provider]  Lactobacillus (ACIDOPHOLUS PO) Take 1 tablet by mouth daily.   Yes [provider]  magnesium oxide (MAG-OX) 400 MG tablet Take 400 mg by mouth 2 (two) times daily.   Yes [provider]  metolazone (ZAROXOLYN) 2.5 MG tablet Take 2.5 mg (1 tablet) on Monday, Wednesday & Friday 30 minutes prior to taking torsemide. 11/15/18  Yes Evans Lance, MD  Multiple Vitamin (MULTIVITAMIN) tablet Take 1 tablet by mouth daily.   Yes [provider]  pantoprazole (PROTONIX) 40 MG tablet Take 1 tablet (40 mg total) by mouth daily before breakfast. 03/31/17  Yes Sinda Du, MD  primidone (MYSOLINE) 50 MG tablet Take 50 mg by mouth daily.    Yes [provider]  sertraline (ZOLOFT) 50 MG tablet Take 50 mg by mouth daily.   Yes [provider]  simvastatin (ZOCOR) 40 MG tablet Take 40 mg  by mouth at bedtime.  Yes [provider]  torsemide (DEMADEX) 20 MG tablet Take 2 tablets (40 mg total) by mouth 2 (two) times daily. 11/21/18 03/02/2019 Yes Evans Lance, MD  warfarin (COUMADIN) 4 MG tablet Take 3 tablets daily or as directed Patient taking differently: Take 12 mg by mouth every evening.  09/18/18  Yes Branch, Alphonse Guild, MD  albuterol (PROVENTIL HFA;VENTOLIN HFA) 108 (90 BASE) MCG/ACT inhaler Inhale 2 puffs into the lungs every 6 (six) hours as needed. For shortness of breath    [provider]  nitroGLYCERIN (NITROSTAT) 0.4 MG SL tablet Place 1 tablet (0.4 mg total) under the tongue every 5 (five) minutes x 3 doses as needed. For chest pain 03/08/18   Evans Lance, MD  ondansetron (ZOFRAN) 4 MG tablet Take 4 mg by mouth 4 (four) times daily as needed for nausea or vomiting.    [provider]     Critical care time:  n/a    Noe Gens, NP-C [place] [facility] Pgr: [phone] or if no answer [phone] 12/04/2018, 12:01 PM  Attending Note:  63 year old male with PMH of COPD who presents to PCCM with acute hypoxemic respiratory failure due to CHF.  On exam, bibasilar crackles noted.  I reviewed CXR myself, acute pulmonary edema noted.  Discussed with PCCM-NP.  Patient intermittently decompensates and requires 100% NRB.  The next time he decompensates then would recommend checking a blood gas to confirm hypoxemia.  Ambulate and check for desaturation as well.  Reduce solumedrol to 40 mg IV daily.  Continue brovana and pulmicort BID.  Xopenex/atrovent TID.  PCCM will continue to follow.  The patient is critically ill with multiple organ systems failure and requires high complexity decision making for assessment and support, frequent evaluation and titration of therapies, application of advanced monitoring technologies and extensive interpretation of multiple databases.   Critical Care Time devoted to patient care services described in  this note is  40  Minutes. This time reflects time of care of this signee Dr Jennet Maduro. This critical care time does not reflect procedure time, or teaching time or supervisory time of PA/NP/Med student/Med Resident etc but could involve care discussion time.  Rush Farmer, M.D. [facility] [place]. Pager: [phone]. After hours pager: [phone].

## 2018-12-04 NOTE — Progress Notes (Signed)
CSW referred to meet with patient to discuss LVAD Assessment work up. Patient immediately stated "I don't think I want to get it". Patient stated he would rather "just go home and live out my days as I have been". Patient and CSW discussed supportive needs at home and patient shared multiple family members who assist with his care needs. CSW discussed with patient his goals of care and the importance of completing an Advanced Directive. Patient appears to have some specific goals of care and reports that he does not have an advanced directive. CSW discussed Palliative Care referral for further discussion around goals of care and completion of an AD. Patient verbalizes understanding and agreeable to further discussions. CSW will share discussion at LVAD MRB and available if further needs arise. Raquel Sarna, [place], [place]

## 2018-12-04 NOTE — Progress Notes (Signed)
ANTICOAGULATION CONSULT NOTE - Follow Up Consult  Pharmacy Consult for Heparin Indication: atrial fibrillation  Allergies  Allergen Reactions  . Vancomycin Itching and Rash   Patient Measurements: Height: 6' (182.9 cm) Weight: 179 lb 3.7 oz (81.3 kg) IBW/kg (Calculated) : 77.6 Heparin Dosing Weight: 85.3 kg  Vital Signs: BP: 152/65 (03/02 1421) Pulse Rate: 95 (03/02 1328)  Labs: Recent Labs    12/01/18 1606  12/02/18 0515  12/03/18 0511 12/03/18 0740 12/04/18 0557  HGB 8.5*  --  8.2*  --  7.6*  --  7.9*  HCT 28.5*  --  26.9*  --  25.5*  --  25.6*  PLT 203  --  177  --  146*  --  169  APTT 41*  --   --   --   --   --   --   LABPROT  --   --  15.9*  --  15.0  --  15.3*  INR  --   --  1.3*  --  1.2  --  1.2  HEPARINUNFRC  --    < >  --    < > 1.05* 0.36 >2.20*  CREATININE  --   --  1.71*  --  1.56*  --  1.65*   < > = values in this interval not displayed.    Estimated Creatinine Clearance: 50.9 mL/min (A) (by C-G formula based on SCr of 1.65 mg/dL (H)).  . heparin Stopped (12/04/18 0735)  . milrinone 0.25 mcg/kg/min (12/04/18 0412)     Assessment:  Anticoag: Warfarin PTA  for Afib, now hold start hep when INR < 2 -INR 2.66>> 2.9>> 4.1>2 so IV heparin started with rapid fall in INR.   Heparin level drawn from line where infusing this morning and was falsely elevated.  No overt bleeding or complications noted.  Planned for cath so heparin was turned off - could not lie flat - rescheduled for 3/3 Heparin restarted   Goal of Therapy:  Heparin level 0.3-0.7 units/ml Monitor platelets by anticoagulation protocol: Yes   Plan:  Cont heparin at 1800 units/hr Daily heparin level and CBC. F/u ability to resume po anticoagulation eventually after cath.  Bonnita Nasuti Pharm.D. CPP, BCPS Clinical Pharmacist [phone] 12/04/2018 3:30 PM

## 2018-12-04 NOTE — Progress Notes (Addendum)
Patient ID: [name], male   DOB: Sep 21, 1956, 63 y.o.   MRN: [id number]     Advanced Heart Failure Rounding Note  PCP-Cardiologist: Carlyle Dolly, MD   Subjective:    Attempted to wean milrinone to 0.125 yesterday and on po torsemide.  Much more short of breath overnight.  Milrinone increased back to 0.25.  CVP 10-12 on my measure this morning and co-ox 74%.  I saw him in the cath lab, he was unable to lie down and was markedly short of breath.  Got Lasix 80 IV x 1 and Duoneb treatment.   He required NRBM.  He was taken back to his room due to inability to lie down.  No chest pain.  He is afebrile.   Hgb 7.6 => 7.9, no overt bleeding.     PFTs 12/01/18: Severe obstruction FEV1 1.33 (35%) FVC   1.75 (34%) Ratio 76% DLCO 33%  Seen by Dr. Ron Agee 2/29. Felt not to be candidate for VAD at this point due to pulmonary status   Echo: EF 20-25%, moderate MR, normal RV size/systolic function, PASP 52 mmHg.   Objective:   Weight Range: 81.3 kg Body mass index is 24.31 kg/m.   Vital Signs:   Temp:  [97.3 F (36.3 C)-98.1 F (36.7 C)] 98.1 F (36.7 C) (03/01 2001) Pulse Rate:  [83-123] 106 (03/02 0838) Resp:  [0-68] 32 (03/02 0838) BP: (134-172)/(58-110) 159/68 (03/02 0838) SpO2:  [83 %-99 %] 85 % (03/02 0838) FiO2 (%):  [32 %] 32 % (03/02 0725) Weight:  [81.3 kg] 81.3 kg (03/02 0639) Last BM Date: 12/03/18  Weight change: Filed Weights   12/01/18 0500 12/02/18 0323 12/04/18 0639  Weight: 82 kg 81.4 kg 81.3 kg    Intake/Output:   Intake/Output Summary (Last 24 hours) at 12/04/2018 0904 Last data filed at 12/04/2018 0653 Gross per 24 hour  Intake 1472.47 ml  Output 1525 ml  Net -52.53 ml     Physical Exam    General: Sitting on side of bed Neck: JVP 8-9 cm, no thyromegaly or thyroid nodule.  Lungs: Distant BS. CV: Lateral PMI.  Heart mildly tachy, regular S1/S2, no S3/S4, no murmur.  1+ edema to knees.   Abdomen: Soft, nontender, no hepatosplenomegaly, no  distention.  Skin: Intact without lesions or rashes.  Neurologic: Alert and oriented x 3.  Psych: Normal affect. Extremities: No clubbing or cyanosis.  HEENT: Normal.    Telemetry   NSR BiV paced 100s, personally reviewed.   EKG    No new tracings.    Labs    CBC Recent Labs    12/03/18 0511 12/04/18 0557  WBC 3.6* 5.4  NEUTROABS 2.4 4.0  HGB 7.6* 7.9*  HCT 25.5* 25.6*  MCV 96.6 97.0  PLT 146* 664   Basic Metabolic Panel Recent Labs    12/03/18 0511 12/04/18 0557  NA 134* 134*  K 3.9 4.4  CL 92* 93*  CO2 34* 34*  GLUCOSE 264* 201*  BUN 38* 40*  CREATININE 1.56* 1.65*  CALCIUM 8.1* 8.5*   Liver Function Tests No results for input(s): AST, ALT, ALKPHOS, BILITOT, PROT, ALBUMIN in the last 72 hours. No results for input(s): LIPASE, AMYLASE in the last 72 hours. Cardiac Enzymes No results for input(s): CKTOTAL, CKMB, CKMBINDEX, TROPONINI in the last 72 hours.  BNP: BNP (last 3 results) Recent Labs    09/04/18 1517 10/16/18 1536 11/26/18 1645  BNP 605.0* 2,147.0* 1,629.0*   ProBNP (last 3 results) No results for input(s):  PROBNP in the last 8760 hours.  D-Dimer No results for input(s): DDIMER in the last 72 hours. Hemoglobin A1C Recent Labs    12/01/18 1606  HGBA1C 7.7*   Fasting Lipid Panel Recent Labs    12/01/18 1606  CHOL 115  HDL 59  LDLCALC 36  TRIG 100  CHOLHDL 1.9   Thyroid Function Tests No results for input(s): TSH, T4TOTAL, T3FREE, THYROIDAB in the last 72 hours.  Invalid input(s): FREET3  Other results:  Imaging   Dg Chest 2 View  Result Date: 12/03/2018 CLINICAL DATA:  Follow-up pleural effusion. Congestive heart failure. EXAM: CHEST - 2 VIEW COMPARISON:  11/28/2018 FINDINGS: The heart size and mediastinal contours are within normal limits. Pacemaker and right arm PICC line remain in appropriate position. No pneumothorax visualized. Improved aeration of both lungs is seen with decreased atelectasis or infiltrates in both  lung bases. Persistent small bilateral pleural effusions are noted. Diffuse interstitial infiltrates are stable consistent with mild interstitial edema. Both lungs are clear. The visualized skeletal structures are unremarkable. IMPRESSION: 1. Improved aeration of both lungs with decreased atelectasis or infiltrates in both lung bases. 2. Persistent small bilateral pleural effusions. 3. Stable mild diffuse interstitial edema pattern. Electronically Signed   By: Earle Gell M.D.   On: 12/03/2018 09:45   Nm Pulmonary Perf And Vent  Result Date: 12/03/2018 CLINICAL DATA:  63 year old male with acute shortness of breath. Bilateral pleural effusions. EXAM: NUCLEAR MEDICINE VENTILATION - PERFUSION LUNG SCAN TECHNIQUE: Ventilation images were obtained in multiple projections using inhaled aerosol Tc-74m DTPA. Perfusion images were obtained in multiple projections after intravenous injection of Tc-40m MAA. RADIOPHARMACEUTICALS:  29.1 mCi of Tc-7m DTPA aerosol inhalation and 4.0 mCi Tc93m MAA IV COMPARISON:  12/03/2018 and prior chest radiographs FINDINGS: Matched ventilation/perfusion defects from moderate pleural effusions noted. No other perfusion or ventilation defects noted. IMPRESSION: Moderate sized pleural effusions without other perfusion abnormalities corresponding to a low probability for pulmonary embolus (10-19%). Electronically Signed   By: Margarette Canada M.D.   On: 12/03/2018 11:26    Medications:    Scheduled Medications: . feeding supplement (ENSURE ENLIVE)  237 mL Oral BID BM  . ferrous sulfate  325 mg Oral BID  . furosemide  80 mg Intravenous BID  . hydrALAZINE  50 mg Oral Q8H  . insulin aspart  0-20 Units Subcutaneous TID WC  . insulin aspart  0-5 Units Subcutaneous QHS  . insulin aspart  4 Units Subcutaneous TID WC  . insulin glargine  18 Units Subcutaneous Daily  . ipratropium  0.5 mg Nebulization Q6H  . isosorbide mononitrate  60 mg Oral Daily  . levalbuterol  1.25 mg Nebulization TID    . magnesium oxide  400 mg Oral BID  . mouth rinse  15 mL Mouth Rinse BID  . methylPREDNISolone (SOLU-MEDROL) injection  60 mg Intravenous Q12H  . mometasone-formoterol  2 puff Inhalation BID  . pantoprazole  40 mg Oral QAC breakfast  . primidone  50 mg Oral Daily  . sertraline  50 mg Oral Daily  . simvastatin  40 mg Oral QHS  . sodium chloride flush  10-40 mL Intracatheter Q12H  . spironolactone  25 mg Oral Daily    Infusions: . heparin Stopped (12/04/18 0735)  . milrinone 0.25 mcg/kg/min (12/04/18 0412)    PRN Medications: acetaminophen **OR** acetaminophen, albuterol, nitroGLYCERIN, sodium chloride flush, sodium chloride flush  Patient Profile   [name] is a 63 y.o. male  with h/o Paroxysmal Afib, CAD, OA  of both hands, chronic combined systolic/diastolic CHF, CHB s/p [employer] BiV ICD, Carpal Tunnel syndrome, h/o Colon CA, COPD, DM2, peripheral neuropathy, ETOH abuse, GERD, HLD, HTN, and h/o tobacco abuse.   Admitted 11/26/2018 with worsening DOE and edema despite adjustment of outpatient diuretics. Moved to [place] 11/29/18 with low output by PICC line.   Assessment/Plan   1. Acute on chronic systolic CHF: Ischemic cardiomyopathy. [employer] CRT-D device.   Echo in 12/19 with EF 20-25%, moderately dilated RV with mildly decreased systolic function, mild AS, moderate MR, moderate TR, PASP 50 mmHg.  Echo this admission with EF 20-25%, normal RV, moderate MR, PASP 52 mmHg.  PICC in place, co-ox suggested low output at 49%.  We have discussed LVAD with him and he was seen by Dr. Prescott Gum.  Not thought to be a candidate at this point due to severe COPD on PFTs (though may be possible in future if PFTs improve off cigarettes for longer term and with diuresis => CT chest did not show impressive emphysema), however patient also does not think he would want LVAD.  Now on milrinone 0.25, co-ox 74% this morning.  CVP 10-12, had been on po torsemide.  - Continue milrinone 0.25 for  now.  - Restart Lasix 80 mg IV bid today (got dose this morning in cath lab), hold torsemide for now.  - Continue hydralazine at 50 mg TID and Imdur 60 mg daily. Can attempt to transition to losartan/Entresto if renal function stabilizes post cath.  - Increase spironolactone to 25 mg daily.  - Due to pulmonary status, cath cancelled this morning. If he is improved tomorrow, will try right/left heart cath tomorrow.  2. CAD: Anterior MI in 2006 with PCI to LAD.  DES for in-stent restenosis in 2007.  No cath/intervention since that time. No chest pain.   - Given warfarin anticoagulation, he is not on ASA. Currently on heparin pending cath.  - Continue Zocor.  - Cath postponed today due to marked increased dyspnea and unable to lie flat, will attempt tomorrow if he is improved.  3. AKI on CKD stage 3:  Cr 1.7 -> 1.84 -> 1.65 -> 1.7 -> 1.56 -> 1.65. Suspect cardiorenal syndrome.   4. COPD: He recently quit smoking.  Severe COPD by PFTs  2/28. FEV1 1.33 (35%) DLCO 33%. However, CT chest last week was more consistent with mild residual pulmonary edema and showed only mild emphysema.  Markedly more short of breath this morning and sitting on side of bed.  CVP not markedly elevated at 10-12.  Suspect COPD exacerbation may be playing a major role in current symptoms. He is afebrile, normal WBCs.  - Currently requiring NRBM.  - Will start Solumedrol 60 mg IV bid along with Atrovent nebs q6 hrs.  - Repeat CXR.  - Will ask pulmonary to see.   5. Type II diabetes: Suspect glucose will worsen back on steroids.  - Increase insulin glargine.  - Will have diabetes coordinator see him today.   6. Atrial fibrillation: Paroxysmal.  - Appears to be in NSR - Holding coumadin for cath, covering with heparin gtt.  7. Severe protein-calorie malnutrition - encouraged po intake - start Ensure 8. Anemia: ?Chronic disease/renal disease.  No overt GI bleeding though has been on warfarin long-term. Hgb mildly higher today  at 7.9.  - Check Fe studies and B12.  - If hgb drops again, will transfuse.  However, uptrending today so will hold off (also with acute dyspnea, avoid the  excess volume).   CRITICAL CARE Performed by: Loralie Champagne  Total critical care time: 45 minutes  Critical care time was exclusive of separately billable procedures and treating other patients.  Critical care was necessary to treat or prevent imminent or life-threatening deterioration.  Critical care was time spent personally by me on the following activities: development of treatment plan with patient and/or surrogate as well as nursing, discussions with consultants, evaluation of patient's response to treatment, examination of patient, obtaining history from patient or surrogate, ordering and performing treatments and interventions, ordering and review of laboratory studies, ordering and review of radiographic studies, pulse oximetry and re-evaluation of patient's condition.   Length of Stay: 7  Loralie Champagne, MD  12/04/2018, 9:04 AM  Advanced Heart Failure Team Pager [phone] (M-F; 7a - 4p)  Please contact [facility] for night-coverage after hours (4p -7a ) and weekends on [email or web address]

## 2018-12-04 NOTE — Progress Notes (Signed)
Rounding on patient found him sitting on side of bed C/O feeling so SOB. "I don't think I'm peeing enough". Notified MD on Call Dr. Juliane Lack and received an order for a one time dose of Lasix 20 mg IV. Patient's sats 97% on 3L [place], and lungs not sounding wet. Administered Lasix and will continue to monitor for signs of improving breathing. Patient very appreciative.

## 2018-12-04 NOTE — Progress Notes (Signed)
Found patient sitting on the side of his bed stating he was having a hard time catching his breath. O2 sats reading 99% on 3L of O2. Patient's CVP reads have increased from 7 to 9 tonight. Dr. Haroldine Laws changed patients Milrinone dose from 0.54mcg to 0.11mcg today. Patient's I/O's were normal and lung sounds are clear and diminished. Notified on call Cardiologist and he advised to change Milrinone back to 0.82mcg/kg/min. Dose adjusted as ordered. Will continue to monitor for signs of improvement with patients symptoms of SOB.

## 2018-12-05 ENCOUNTER — Ambulatory Visit: Payer: Self-pay | Admitting: Student

## 2018-12-05 ENCOUNTER — Encounter (HOSPITAL_COMMUNITY)
Admission: EM | Disposition: A | Discharge status: Hospice | Payer: Self-pay | Source: Home / Self Care | Attending: Cardiology

## 2018-12-05 DIAGNOSIS — J90 Pleural effusion, not elsewhere classified: Secondary | ICD-10-CM

## 2018-12-05 DIAGNOSIS — J810 Acute pulmonary edema: Secondary | ICD-10-CM

## 2018-12-05 DIAGNOSIS — Z66 Do not resuscitate: Secondary | ICD-10-CM

## 2018-12-05 DIAGNOSIS — Z7189 Other specified counseling: Secondary | ICD-10-CM

## 2018-12-05 DIAGNOSIS — Z515 Encounter for palliative care: Secondary | ICD-10-CM

## 2018-12-05 LAB — BASIC METABOLIC PANEL
Anion gap: 7 (ref 5–15)
BUN: 49 mg/dL — ABNORMAL HIGH (ref 8–23)
CO2: 35 mmol/L — ABNORMAL HIGH (ref 22–32)
Calcium: 8.8 mg/dL — ABNORMAL LOW (ref 8.9–10.3)
Chloride: 93 mmol/L — ABNORMAL LOW (ref 98–111)
Creatinine, Ser: 1.86 mg/dL — ABNORMAL HIGH (ref 0.61–1.24)
GFR calc Af Amer: 44 mL/min — ABNORMAL LOW (ref >60)
GFR, EST NON AFRICAN AMERICAN: 38 mL/min — AB (ref >60)
Glucose, Bld: 173 mg/dL — ABNORMAL HIGH (ref 70–99)
Potassium: 4.4 mmol/L (ref 3.5–5.1)
Sodium: 135 mmol/L (ref 135–145)

## 2018-12-05 LAB — GLUCOSE, CAPILLARY
Glucose-Capillary: 186 mg/dL — ABNORMAL HIGH (ref 70–99)
Glucose-Capillary: 129 mg/dL — ABNORMAL HIGH (ref 70–99)
Glucose-Capillary: 368 mg/dL — ABNORMAL HIGH (ref 70–99)
Glucose-Capillary: 360 mg/dL — ABNORMAL HIGH (ref 70–99)

## 2018-12-05 LAB — CBC WITH DIFFERENTIAL/PLATELET
Abs Immature Granulocytes: 0.02 10*3/uL (ref 0.00–0.07)
BASOS PCT: 1 %
Basophils Absolute: 0 10*3/uL (ref 0.0–0.1)
Eosinophils Absolute: 0.2 10*3/uL (ref 0.0–0.5)
Eosinophils Relative: 4 %
HCT: 25.1 % — ABNORMAL LOW (ref 39.0–52.0)
Hemoglobin: 7.5 g/dL — ABNORMAL LOW (ref 13.0–17.0)
Immature Granulocytes: 0 %
Lymphocytes Relative: 10 %
Lymphs Abs: 0.6 10*3/uL — ABNORMAL LOW (ref 0.7–4.0)
MCH: 29.1 pg (ref 26.0–34.0)
MCHC: 29.9 g/dL — AB (ref 30.0–36.0)
MCV: 97.3 fL (ref 80.0–100.0)
Monocytes Absolute: 0.5 10*3/uL (ref 0.1–1.0)
Monocytes Relative: 9 %
Neutro Abs: 4.6 10*3/uL (ref 1.7–7.7)
Neutrophils Relative %: 76 %
PLATELETS: 158 10*3/uL (ref 150–400)
RBC: 2.58 MIL/uL — ABNORMAL LOW (ref 4.22–5.81)
RDW: 14 % (ref 11.5–15.5)
WBC: 6 10*3/uL (ref 4.0–10.5)
nRBC: 0 % (ref 0.0–0.2)

## 2018-12-05 LAB — PROTIME-INR
INR: 1.1 (ref 0.8–1.2)
Prothrombin Time: 14.5 seconds (ref 11.4–15.2)

## 2018-12-05 LAB — COOXEMETRY PANEL
CARBOXYHEMOGLOBIN: 2.1 % — AB (ref 0.5–1.5)
Methemoglobin: 1.7 % — ABNORMAL HIGH (ref 0.0–1.5)
O2 Saturation: 65.5 %
Total hemoglobin: 7 g/dL — ABNORMAL LOW (ref 12.0–16.0)

## 2018-12-05 LAB — PREPARE RBC (CROSSMATCH)

## 2018-12-05 LAB — HEPARIN LEVEL (UNFRACTIONATED): Heparin Unfractionated: 0.56 IU/mL (ref 0.30–0.70)

## 2018-12-05 SURGERY — RIGHT/LEFT HEART CATH AND CORONARY ANGIOGRAPHY
Anesthesia: LOCAL

## 2018-12-05 MED ORDER — ONDANSETRON 4 MG PO TBDP
4.0000 mg | ORAL_TABLET | Freq: Once | ORAL | Status: AC
  Administered 2018-12-05: 4 mg via ORAL
  Filled 2018-12-05: qty 1

## 2018-12-05 MED ORDER — METOLAZONE 2.5 MG PO TABS
2.5000 mg | ORAL_TABLET | Freq: Once | ORAL | Status: AC
  Administered 2018-12-05: 2.5 mg via ORAL
  Filled 2018-12-05: qty 1

## 2018-12-05 MED ORDER — SODIUM CHLORIDE 0.9% IV SOLUTION
Freq: Once | INTRAVENOUS | Status: AC
  Administered 2018-12-05: 08:00:00 via INTRAVENOUS

## 2018-12-05 MED ORDER — MORPHINE SULFATE (CONCENTRATE) 10 MG/0.5ML PO SOLN
5.0000 mg | Freq: Every day | ORAL | Status: AC
  Administered 2018-12-05: 5 mg via ORAL
  Filled 2018-12-05: qty 0.5

## 2018-12-05 MED ORDER — MILRINONE LACTATE IN DEXTROSE 20-5 MG/100ML-% IV SOLN
0.2500 ug/kg/min | INTRAVENOUS | Status: DC
  Administered 2018-12-05 – 2018-12-07 (×4): 0.25 ug/kg/min via INTRAVENOUS
  Filled 2018-12-05 (×4): qty 100

## 2018-12-05 NOTE — Progress Notes (Signed)
NAME:  [name], MRN:  [id number], DOB:  [birth year], LOS: 8 ADMISSION DATE:  11/26/2018, CONSULTATION DATE:  3/2 REFERRING MD:  Dr. Aundra Dubin, CHIEF COMPLAINT:  Shortness of Breath    Brief History   63 y/o M admitted 2/23 to [facility] with worsening SOB, LE swelling.  Transferred to [facility] 2/26 for evaluation by CHF service.  Pt unable to lie flat for LHC 3/2, PCCM consulted for dyspnea.   History of present illness   63 y/o M who presented 2/23 to [facility] ER via EMS with reports of gradual worsening of shortness of breath.  The patient reported on admit he had noted swelling increasing from his feet to his abdomen.    He was initially admitted per TRH to [facility].  Initial work up notable for AKI with serum creatinine of 1.66 / BUN 31, troponin 0.06, BNP 1629.  CXR demonstrated cardiomegaly, interstitial edema and right pleural effusion.  PICC line placed and diuresis attempted. Initial Co-ox 61% but dropped to 49% 2/26 with low output CHF and was transferred to [facility] 2/26 for further evaluation.   On arrival to [facility] SDU, he was noted to have increased work of breathing and a new O2 need that required NRB.  I/O's were in question.  He was placed on milrinone, heparin gtt.  He was intermittently diuresed.  He was taken to the cath lab for assessment but was unable to lie flat for procedure.  PFT's were evaluated 2/28 with FEV1 1.33 (35%), FVC 1.75 (34%), Ratio 76%, DLCO 33%. Further, the patient was evaluated by CVTS for VAD placement and felt his pulmonary status would preclude sternotomy & VAD implantation.  ECHO 2/29 with LVEF 20-25%, moderate MR, normal RV size / systolic function, PASP 52 mmHg.  CT chest evaluation on 2/29 notable for mild emphysema, atelectasis, R>L pleural effusions.   On 3/2, the patient remained on 4L/[place] and PCCM consulted for evaluation of dyspnea. He currently reports no fevers, sputum production (does not normally produce sputum).  Indicates progressively worsening swelling / SOB brought  him into the hospital.  States he quit smoking 2 weeks ago.  Began smoking age 20, smoked up to 2.5 to 3ppd.  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    Past Medical History  Mixed Ischemic / non-ischemic cardiomyopathy Chronic AF on Coumadin  Subtotal colectomy for large tubular adenoma at ileocecal valve  Chronic diarrhea because of colon resection Barrett's Esophagitis at [place] Former Heavy ETOH, quit 2006  [place] [facility]   2/23  Admit to [facility] with SOB, swelling up to abdomen  2/26  Transferred to [facility] for CHF evaluation  3/02  PCCM consulted for SOB.  Pt unable to lie flat for LHC  Consults:  PCCM 3/2   Procedures:    Significant Diagnostic Tests:  CT Chest 2/29 >> mild cardiomegaly, bilateral effusions with interstitial thickening in the lungs & patchy hazy airspace opacities consistent with pulmonary edema, bilateral lower lobe, RML, LUL lingula atelectasis  CT Abd/Pelvis 2/29 >> no acute findings, trace ascites  Micro Data:    Antimicrobials:     Interim history/subjective:  No events overnight, requiring 3-4L O2 via [place]  Objective   Blood pressure (!) 141/62, pulse 96, temperature 97.9 F (36.6 C), temperature source Oral, resp. rate 18, height 6' (1.829 m), weight 82.1 kg, SpO2 (!) 89 %. CVP:  [8 mmHg] 8 mmHg  FiO2 (%):  [36 %] 36 %   Intake/Output Summary (Last 24 hours)  at 12/05/2018 1431 Last data filed at 12/05/2018 1315 Gross per 24 hour  Intake 566.17 ml  Output 1450 ml  Net -883.83 ml   Filed Weights   12/02/18 0323 12/04/18 0639 12/05/18 0500  Weight: 81.4 kg 81.3 kg 82.1 kg    Examination: General: Chronically ill appearing male, NAD HEENT: MM pink/moist, [place] O2 Neuro: Alert and oriented, moving all ext to command CV: RRR, Nl S1/S2 and -M/R/G PULM: Bibasilar crackles GI: Soft, NT, ND and +BS Extremities: warm/dry, pitting BLE edema, LE's wrapped   Skin: no rashes or lesions  I reviewed CXR myself, pulmonary edema  noted  Resolved Hospital Problem list     Assessment & Plan:   Acute Hypoxemic Respiratory Failure Combination of COPD and pulmonary edema COPD  P: Titrate O2 for sat of 88-92% Await L/R heart cath  D/C BiPAP Change from HFNC to regular [place] Flutter valve / incentive spirometry for atelectasis  Mobilize / OOB Ambulatory desaturation study prior to discharge for home O2 need (patient was supposed to be on O2 at home for years but has not been compliant, stressed importance to patient as a matter of life and death) Hold Dulera, likely not strong enough to adequately inhale  Brovana + Pulmicort BID  Xopenex / atrovent TID > likely can make PRN 3/3 Reduce solumedrol to 40 mg IV QD, would D/C after 5 day course Active diureses as renal function allows  Bilateral Pleural Effusion  -R>L, small to moderate P: F/U CXR Monitor effusions with diuresis  No thora  Acute on Chronic sCHF / Ischemic Cardiomyopathy  P: Per CHF Service  Follow co-ox   Recommendations as above, PCCM will sign off, please call back if needed  Labs   CBC: Recent Labs  Lab 12/01/18 1606 12/02/18 0515 12/03/18 0511 12/04/18 0557 12/05/18 0420  WBC 5.0 4.6 3.6* 5.4 6.0  NEUTROABS 3.7 3.3 2.4 4.0 4.6  HGB 8.5* 8.2* 7.6* 7.9* 7.5*  HCT 28.5* 26.9* 25.5* 25.6* 25.1*  MCV 97.3 96.8 96.6 97.0 97.3  PLT 203 177 146* 169 884    Basic Metabolic Panel: Recent Labs  Lab 11/30/18 0430 12/01/18 0407 12/02/18 0515 12/03/18 0511 12/04/18 0557 12/05/18 0420  NA 132* 133* 133* 134* 134* 135  K 4.5 3.8 4.1 3.9 4.4 4.4  CL 90* 89* 91* 92* 93* 93*  CO2 33* 34* 34* 34* 34* 35*  GLUCOSE 322* 203* 293* 264* 201* 173*  BUN 43* 46* 43* 38* 40* 49*  CREATININE 1.84* 1.65* 1.71* 1.56* 1.65* 1.86*  CALCIUM 8.6* 8.5* 8.5* 8.1* 8.5* 8.8*  MG 2.0  --   --   --   --   --    GFR: Estimated Creatinine Clearance: 45.2 mL/min (A) (by C-G formula based on SCr of 1.86 mg/dL (H)). Recent Labs  Lab 12/02/18 0515  12/03/18 0511 12/04/18 0557 12/05/18 0420  WBC 4.6 3.6* 5.4 6.0    Liver Function Tests: No results for input(s): AST, ALT, ALKPHOS, BILITOT, PROT, ALBUMIN in the last 168 hours. No results for input(s): LIPASE, AMYLASE in the last 168 hours. No results for input(s): AMMONIA in the last 168 hours.  ABG    Component Value Date/Time   PHART 7.398 12/01/2018 1710   PCO2ART 58.2 (H) 12/01/2018 1710   PO2ART 72.5 (L) 12/01/2018 1710   HCO3 35.2 (H) 12/01/2018 1710   TCO2 28 01/26/2014 0650   ACIDBASEDEF 17.2 (H) 09/04/2018 1515   O2SAT 65.5 12/05/2018 0410     Coagulation Profile: Recent  Labs  Lab 12/01/18 0407 12/02/18 0515 12/03/18 0511 12/04/18 0557 12/05/18 0420  INR 1.5* 1.3* 1.2 1.2 1.1    Cardiac Enzymes: No results for input(s): CKTOTAL, CKMB, CKMBINDEX, TROPONINI in the last 168 hours.  HbA1C: Hgb A1c MFr Bld  Date/Time Value Ref Range Status  12/01/2018 04:06 PM 7.7 (H) 4.8 - 5.6 % Final    Comment:    (NOTE) Pre diabetes:          5.7%-6.4% Diabetes:              >6.4% Glycemic control for   <7.0% adults with diabetes   09/04/2018 03:24 PM 7.3 (H) 4.8 - 5.6 % Final    Comment:    (NOTE) Pre diabetes:          5.7%-6.4% Diabetes:              >6.4% Glycemic control for   <7.0% adults with diabetes     CBG: Recent Labs  Lab 12/04/18 1053 12/04/18 1638 12/04/18 2119 12/05/18 0603 12/05/18 1137  GLUCAP 220* 309* 336* 186* 129*    Review of Systems: Positives in [place]   Gen: Denies fever, chills, weight change, fatigue, night sweats HEENT: Denies blurred vision, double vision, hearing loss, tinnitus, sinus congestion, rhinorrhea, sore throat, neck stiffness, dysphagia PULM: Denies shortness of breath, cough, sputum production, hemoptysis, wheezing CV: Denies chest pain, edema, orthopnea, paroxysmal nocturnal dyspnea, palpitations GI: Denies abdominal pain, nausea, vomiting, "chronic" diarrhea, hematochezia, melena, constipation, change in  bowel habits GU: Denies dysuria, hematuria, polyuria, oliguria, urethral discharge Endocrine: Denies hot or cold intolerance, polyuria, polyphagia or appetite change Derm: Denies rash, dry skin, scaling or peeling skin change Heme: Denies easy bruising, bleeding, bleeding gums Neuro: Denies headache, numbness, weakness, slurred speech, loss of memory or consciousness    Past Medical History  He,  has a past medical history of Arthritis, Biventricular ICD (implantable cardioverter-defibrillator) in place, CAD (coronary artery disease), Cardiomyopathy ([place]), Carpal tunnel syndrome, Chronic systolic (congestive) heart failure ([place]), Colon cancer ([place]) (2006), Complete heart block ([place]), COPD (chronic obstructive pulmonary disease) ([place]), Diabetes mellitus, type II ([place]), H/O alcohol dependence ([place]), H/O hiatal hernia, History of pneumonia (2013), Hyperlipidemia, Hypertension, Myocardial infarction ([place]), Neuropathy in diabetes ([place]), and PAF (paroxysmal atrial fibrillation) ([place]).   Surgical History    Past Surgical History:  Procedure Laterality Date  . ANKLE SURGERY     Right  . Biventricular ICD Placement  02/09/2013   Dr. Lovena Le  . COLONOSCOPY  08/17/2005   Pancolonic diverticula/Multiple rectal polyps removed as described above. The larger of the two likely responsible for intermittent hematochezia/ Multiple polyps at the hepatic flexure resected with a snare./ Large polypoid lesion growing out of the base of the cecum, just behind the  ileocecal valve. This was not felt to be amendable to endoscopic resection. Biopsied multiple times  . CORONARY ANGIOPLASTY WITH STENT PLACEMENT    . ESOPHAGOGASTRODUODENOSCOPY N/A 03/29/2017   Barrett's esophagus, gastritis, duodenitis, focal glandular atypia on biopsy. Needs surveillance June 2019   . FLEXIBLE SIGMOIDOSCOPY    06/13/2006   Essentially normal residual rectum status post total colectomy. anastomosis at 25cm.  Focal area of abnormality  adjacent to suture, as described above, likely not significant, suspect granulation tissue, biopsied.  Anastomosis otherwise appeared normal.  Distal terminal ileum appeared normal  . FLEXIBLE SIGMOIDOSCOPY N/A 01/13/2015   Dr. Gala Romney: normal-appearing residual rectum, surgical anastomosis, s/p subtotal colectomy. Surveillance 5 years   . FLEXIBLE SIGMOIDOSCOPY N/A 03/29/2017  Dr. Oneida Alar while inpatient: intermittent rectal bleeding due to ischemic erosion at anastomosis,s/p APC therapy. one 3 mm polyp in recum (hyperplastic). surveillance in 3 years  . INGUINAL HERNIA REPAIR    . MULTIPLE EXTRACTIONS WITH ALVEOLOPLASTY N/A 04/09/2013   Procedure: Extraction of tooth #'s 1,2,4,5,6,7,8,9,10,11,12,13,17,18,20,21,22,23,27,28, 29,30,31, and 32 with alveoloplasty.;  Surgeon: Lenn Cal, DDS;  Location: [facility];  Service: Oral Surgery;  Laterality: N/A;  . PERMANENT PACEMAKER INSERTION N/A 02/09/2013   Procedure: PERMANENT PACEMAKER INSERTION;  Surgeon: Evans Lance, MD;  Location: [facility] CATH LAB;  Service: Cardiovascular;  Laterality: N/A;  . SUBTOTAL COLECTOMY  09/01/2005   subtotal colectomy  . TEMPORARY PACEMAKER INSERTION Right 02/09/2013   Procedure: TEMPORARY PACEMAKER INSERTION;  Surgeon: Sinclair Grooms, MD;  Location: [facility] CATH LAB;  Service: Cardiovascular;  Laterality: Right;     Social History   reports that he has been smoking cigarettes. He started smoking about 47 years ago. He has a 141.00 pack-year smoking history. He has never used smokeless tobacco. He reports that he does not drink alcohol or use drugs.   Family History   His family history includes Colon cancer in his mother; Diabetes in his brother; Heart disease in his brother, father, and mother; Hypertension in his brother. There is no history of Liver disease.   Allergies Allergies  Allergen Reactions  . Vancomycin Itching and Rash     Home Medications  Prior to Admission medications   Medication Sig Start Date End Date  Taking? Authorizing Provider  acetaminophen (TYLENOL) 500 MG tablet Take 500 mg by mouth every 6 (six) hours as needed for mild pain or moderate pain.  01/08/14  Yes [provider]  albuterol (PROVENTIL) (2.5 MG/3ML) 0.083% nebulizer solution Take 2.5 mg by nebulization every 6 (six) hours as needed. For shortness of breath   Yes [provider]  ferrous sulfate 325 (65 FE) MG EC tablet Take 325 mg by mouth 2 (two) times daily.   Yes [provider]  insulin glargine (LANTUS) 100 UNIT/ML injection Inject 20 Units into the skin daily as needed (for high blood sugar).    Yes [provider]  Lactobacillus (ACIDOPHOLUS PO) Take 1 tablet by mouth daily.   Yes [provider]  magnesium oxide (MAG-OX) 400 MG tablet Take 400 mg by mouth 2 (two) times daily.   Yes [provider]  metolazone (ZAROXOLYN) 2.5 MG tablet Take 2.5 mg (1 tablet) on Monday, Wednesday & Friday 30 minutes prior to taking torsemide. 11/15/18  Yes Evans Lance, MD  Multiple Vitamin (MULTIVITAMIN) tablet Take 1 tablet by mouth daily.   Yes [provider]  pantoprazole (PROTONIX) 40 MG tablet Take 1 tablet (40 mg total) by mouth daily before breakfast. 03/31/17  Yes Sinda Du, MD  primidone (MYSOLINE) 50 MG tablet Take 50 mg by mouth daily.    Yes [provider]  sertraline (ZOLOFT) 50 MG tablet Take 50 mg by mouth daily.   Yes [provider]  simvastatin (ZOCOR) 40 MG tablet Take 40 mg by mouth at bedtime.    Yes [provider]  torsemide (DEMADEX) 20 MG tablet Take 2 tablets (40 mg total) by mouth 2 (two) times daily. 11/21/18 02/13/2019 Yes Evans Lance, MD  warfarin (COUMADIN) 4 MG tablet Take 3 tablets daily or as directed Patient taking differently: Take 12 mg by mouth every evening.  09/18/18  Yes Arnoldo Lenis, MD  albuterol (PROVENTIL HFA;VENTOLIN HFA) 108 (90 BASE)  MCG/ACT inhaler Inhale 2 puffs into the lungs every 6 (six)  hours as needed. For shortness of breath    [provider]  nitroGLYCERIN (NITROSTAT) 0.4 MG SL tablet Place 1 tablet (0.4 mg total) under the tongue every 5 (five) minutes x 3 doses as needed. For chest pain 03/08/18   Evans Lance, MD  ondansetron (ZOFRAN) 4 MG tablet Take 4 mg by mouth 4 (four) times daily as needed for nausea or vomiting.    [provider]    Rush Farmer, M.D. [facility] Pulmonary/Critical Care Medicine. Pager: [phone]. After hours pager: [phone].

## 2018-12-05 NOTE — Progress Notes (Signed)
Patient ID: [name], male   DOB: 29-Feb-1956, 63 y.o.   MRN: [id number]     Advanced Heart Failure Rounding Note  PCP-Cardiologist: Carlyle Dolly, MD   Subjective:    He is still short of breath this morning and sitting on side of bed.  CVP 12 on my measure today.  Good UOP yesterday with net negative -2039.  Co-ox 66% on milrinone 0.25.  He was started on IV steroids yesterday and is getting nebs.  Creatinine up to 1.86.   He is afebrile.   Hgb 7.6 => 7.9 => 7.5, no overt bleeding.     PFTs 12/01/18: Severe obstruction FEV1 1.33 (35%) FVC   1.75 (34%) Ratio 76% DLCO 33%  Seen by Dr. Ron Agee 2/29. Felt not to be candidate for VAD at this point due to pulmonary status   Echo: EF 20-25%, moderate MR, normal RV size/systolic function, PASP 52 mmHg.   CT chest showed pulmonary edema, mild emphysema.   Objective:   Weight Range: 82.1 kg Body mass index is 24.55 kg/m.   Vital Signs:   Temp:  [97.7 F (36.5 C)-98.4 F (36.9 C)] 97.7 F (36.5 C) (03/03 0300) Pulse Rate:  [95-111] 99 (03/03 0723) Resp:  [0-32] 24 (03/03 0723) BP: (130-164)/(55-83) 145/69 (03/03 0605) SpO2:  [85 %-99 %] 93 % (03/03 0731) FiO2 (%):  [36 %-40 %] 36 % (03/03 0731) Weight:  [82.1 kg] 82.1 kg (03/03 0500) Last BM Date: 12/04/18  Weight change: Filed Weights   12/02/18 0323 12/04/18 0639 12/05/18 0500  Weight: 81.4 kg 81.3 kg 82.1 kg    Intake/Output:   Intake/Output Summary (Last 24 hours) at 12/05/2018 0828 Last data filed at 12/05/2018 4917 Gross per 24 hour  Intake 261.17 ml  Output 2300 ml  Net -2038.83 ml     Physical Exam    General: NAD Neck: JVP 12 cm, no thyromegaly or thyroid nodule.  Lungs: Distant BS CV: Nondisplaced PMI.  Heart regular S1/S2, no S3/S4, no murmur.  1+ edema to thighs.   Abdomen: Soft, nontender, no hepatosplenomegaly, no distention.  Skin: Intact without lesions or rashes.  Neurologic: Alert and oriented x 3.  Psych: Normal affect. Extremities: No  clubbing or cyanosis.  HEENT: Normal.   Telemetry   NSR BiV paced 100s, personally reviewed.   EKG    No new tracings.    Labs    CBC Recent Labs    12/04/18 0557 12/05/18 0420  WBC 5.4 6.0  NEUTROABS 4.0 4.6  HGB 7.9* 7.5*  HCT 25.6* 25.1*  MCV 97.0 97.3  PLT 169 915   Basic Metabolic Panel Recent Labs    12/04/18 0557 12/05/18 0420  NA 134* 135  K 4.4 4.4  CL 93* 93*  CO2 34* 35*  GLUCOSE 201* 173*  BUN 40* 49*  CREATININE 1.65* 1.86*  CALCIUM 8.5* 8.8*   Liver Function Tests No results for input(s): AST, ALT, ALKPHOS, BILITOT, PROT, ALBUMIN in the last 72 hours. No results for input(s): LIPASE, AMYLASE in the last 72 hours. Cardiac Enzymes No results for input(s): CKTOTAL, CKMB, CKMBINDEX, TROPONINI in the last 72 hours.  BNP: BNP (last 3 results) Recent Labs    09/04/18 1517 10/16/18 1536 11/26/18 1645  BNP 605.0* 2,147.0* 1,629.0*   ProBNP (last 3 results) No results for input(s): PROBNP in the last 8760 hours.  D-Dimer No results for input(s): DDIMER in the last 72 hours. Hemoglobin A1C No results for input(s): HGBA1C in the last 72 hours.  Fasting Lipid Panel No results for input(s): CHOL, HDL, LDLCALC, TRIG, CHOLHDL, LDLDIRECT in the last 72 hours. Thyroid Function Tests No results for input(s): TSH, T4TOTAL, T3FREE, THYROIDAB in the last 72 hours.  Invalid input(s): FREET3  Other results:  Imaging   Dg Orthopantogram  Result Date: 12/04/2018 CLINICAL DATA:  Cardiomyopathy EXAM: ORTHOPANTOGRAM/PANORAMIC COMPARISON:  None. FINDINGS: Patient is edentulous. No osseous lesions of the mandible or visible. IMPRESSION: Edentulous oral cavity. Electronically Signed   By: Ulyses Jarred M.D.   On: 12/04/2018 16:39   Dg Chest Port 1 View  Result Date: 12/04/2018 CLINICAL DATA:  Shortness of Breath EXAM: PORTABLE CHEST 1 VIEW COMPARISON:  12/03/2018 FINDINGS: Left pacer and right PICC line remain in place, unchanged. Cardiomegaly. Worsening  bilateral airspace disease which could reflect edema or infection. Stable moderate layering bilateral effusions. No acute bony abnormality. IMPRESSION: Worsening bilateral airspace disease, edema versus infection. Continued moderate bilateral layering effusions. Electronically Signed   By: Rolm Baptise M.D.   On: 12/04/2018 09:26    Medications:    Scheduled Medications: . sodium chloride   Intravenous Once  . arformoterol  15 mcg Nebulization BID  . budesonide (PULMICORT) nebulizer solution  0.5 mg Nebulization BID  . feeding supplement (ENSURE ENLIVE)  237 mL Oral BID BM  . ferrous sulfate  325 mg Oral BID  . furosemide  80 mg Intravenous BID  . hydrALAZINE  50 mg Oral Q8H  . insulin aspart  0-20 Units Subcutaneous TID WC  . insulin aspart  0-5 Units Subcutaneous QHS  . insulin aspart  6 Units Subcutaneous TID WC  . insulin glargine  24 Units Subcutaneous Daily  . ipratropium  0.5 mg Nebulization TID  . isosorbide mononitrate  60 mg Oral Daily  . levalbuterol  1.25 mg Nebulization TID  . magnesium oxide  400 mg Oral BID  . mouth rinse  15 mL Mouth Rinse BID  . methylPREDNISolone (SOLU-MEDROL) injection  40 mg Intravenous Daily  . metolazone  2.5 mg Oral Once  . pantoprazole  40 mg Oral QAC breakfast  . primidone  50 mg Oral Daily  . sertraline  50 mg Oral Daily  . simvastatin  40 mg Oral QHS  . sodium chloride flush  10-40 mL Intracatheter Q12H  . sodium chloride flush  3 mL Intravenous Q12H  . spironolactone  25 mg Oral Daily    Infusions: . sodium chloride    . sodium chloride    . heparin 1,800 Units/hr (12/05/18 0400)  . milrinone 0.2364 mcg/kg/min (12/05/18 0400)    PRN Medications: sodium chloride, acetaminophen **OR** acetaminophen, albuterol, nitroGLYCERIN, sodium chloride flush, sodium chloride flush, sodium chloride flush  Patient Profile   [name] is a 63 y.o. male  with h/o Paroxysmal Afib, CAD, OA of both hands, chronic combined systolic/diastolic  CHF, CHB s/p [employer] BiV ICD, Carpal Tunnel syndrome, h/o Colon CA, COPD, DM2, peripheral neuropathy, ETOH abuse, GERD, HLD, HTN, and h/o tobacco abuse.   Admitted 11/26/2018 with worsening DOE and edema despite adjustment of outpatient diuretics. Moved to [facility] 11/29/18 with low output by PICC line.   Assessment/Plan   1. Acute on chronic systolic CHF: Ischemic cardiomyopathy. [employer] CRT-D device.   Echo in 12/19 with EF 20-25%, moderately dilated RV with mildly decreased systolic function, mild AS, moderate MR, moderate TR, PASP 50 mmHg.  Echo this admission with EF 20-25%, normal RV, moderate MR, PASP 52 mmHg.  PICC in place, co-ox suggested low output at 49%.  We have discussed LVAD with him and he was seen by Dr. Prescott Gum.  Not thought to be a candidate at this point due to severe COPD on PFTs (though may be possible in future if PFTs improve off cigarettes for longer term and with diuresis => CT chest did not show impressive emphysema), however patient also does not think he would want LVAD.  Now on milrinone 0.25, co-ox 66% this morning.  CVP 12.  He still has impressive peripheral edema and remains quite short of breath.  Good diuresis yesterday.  - Continue milrinone 0.25 for now.  - Continue Lasix 80 mg IV bid and will give a dose of metolazone 2.5 today.  - Continue hydralazine at 50 mg TID and Imdur 60 mg daily. Can attempt to transition to losartan/Entresto if renal function stabilizes eventually.  - Continue spironolactone 25 mg daily.  - With rising creatinine and still unable to lie back due to orthopnea, no cath today.  Will need RHC/LHC in future.  2. CAD: Anterior MI in 2006 with PCI to LAD.  DES for in-stent restenosis in 2007.  No cath/intervention since that time. No chest pain.   - Given warfarin anticoagulation, he is not on ASA. Currently on heparin pending cath.  - Continue Zocor.  - Needs coronary angiography but wait until creatinine stabilizes and he can lie  flat (not today).  3. AKI on CKD stage 3:  Cr 1.7 -> 1.84 -> 1.65 -> 1.7 -> 1.56 -> 1.65 -> 1.86. Suspect cardiorenal syndrome. Still volume overloaded, as above adding metolazone.  Hopefully, as renal venous pressure drops, creatinine will come down.   4. COPD: He recently quit smoking.  Severe COPD by PFTs  2/28. FEV1 1.33 (35%) DLCO 33%. However, CT chest last week was more consistent with mild residual pulmonary edema and showed only mild emphysema.  Still very short of breath and sitting on side of bed.  CVP 12.  Suspect COPD exacerbation may be playing a significant role in current symptoms. He is afebrile, normal WBCs. Seen yesterday by pulmonary.  - Continue Solumedrol 40 mg IV daily and nebs.    5. Type II diabetes: Diabetes coordinator following, increased insulin yesterday.   6. Atrial fibrillation: Paroxysmal.  - Appears to be in NSR - Holding coumadin for cath, covering with heparin gtt.  7. Severe protein-calorie malnutrition - encouraged po intake - start Ensure 8. Anemia: ?Chronic disease/renal disease.  No overt GI bleeding though has been on warfarin long-term. Hgb down to 7.5.  Not Fe deficient.  - Send FOBT.  - Will give 1 unit PRBCs today (with diuretics).    CRITICAL CARE Performed by: Loralie Champagne  Total critical care time: 35 minutes  Critical care time was exclusive of separately billable procedures and treating other patients.  Critical care was necessary to treat or prevent imminent or life-threatening deterioration.  Critical care was time spent personally by me on the following activities: development of treatment plan with patient and/or surrogate as well as nursing, discussions with consultants, evaluation of patient's response to treatment, examination of patient, obtaining history from patient or surrogate, ordering and performing treatments and interventions, ordering and review of laboratory studies, ordering and review of radiographic studies, pulse  oximetry and re-evaluation of patient's condition.  Length of Stay: [place], MD  12/05/2018, 8:28 AM  Advanced Heart Failure Team Pager [phone] (M-F; 7a - 4p)  Please contact [facility] for night-coverage after hours (4p -7a ) and weekends  on [email or web address]

## 2018-12-05 NOTE — Progress Notes (Signed)
ANTICOAGULATION CONSULT NOTE - Follow Up Consult  Pharmacy Consult for Heparin (warfarin on hold)  Indication: atrial fibrillation  Allergies  Allergen Reactions  . Vancomycin Itching and Rash   Patient Measurements: Height: 6' (182.9 cm) Weight: 181 lb (82.1 kg) IBW/kg (Calculated) : 77.6 Heparin Dosing Weight: 85.3 kg  Vital Signs: Temp: 97.8 F (36.6 C) (03/03 0856) Temp Source: Oral (03/03 0856) BP: 147/65 (03/03 0856) Pulse Rate: 96 (03/03 0856)  Labs: Recent Labs    12/03/18 0511 12/03/18 0740 12/04/18 0557 12/05/18 0420  HGB 7.6*  --  7.9* 7.5*  HCT 25.5*  --  25.6* 25.1*  PLT 146*  --  169 158  LABPROT 15.0  --  15.3* 14.5  INR 1.2  --  1.2 1.1  HEPARINUNFRC 1.05* 0.36 >2.20* 0.56  CREATININE 1.56*  --  1.65* 1.86*    Estimated Creatinine Clearance: 45.2 mL/min (A) (by C-G formula based on SCr of 1.86 mg/dL (H)).  . sodium chloride    . sodium chloride    . heparin 1,800 Units/hr (12/05/18 0400)  . milrinone 0.2364 mcg/kg/min (12/05/18 0400)     Assessment:  Anticoag: Warfarin PTA  for Afib, now hold start hep when INR < 2 -INR 2.66>> 2.9>> 4.1>2>1.1 so IV heparin started with rapid fall in INR.   Heparin level 0.56 this am at goal on heparin drip rate 1800 uts/hr No overt bleeding or complications noted.  Pan cath in near future when able to lie flat    Goal of Therapy:  Heparin level 0.3-0.7 units/ml Monitor platelets by anticoagulation protocol: Yes   Plan:  Cont heparin at 1800 units/hr Daily heparin level and CBC. F/u ability to resume po anticoagulation eventually after cath.  Bonnita Nasuti Pharm.D. CPP, BCPS Clinical Pharmacist [phone] 12/05/2018 10:03 AM

## 2018-12-05 NOTE — Consult Note (Signed)
Consultation Note Date: 12/05/2018   Patient Name: [name]  DOB: 1956/06/14  MRN: [id number]  Age / Sex: 63 y.o., male  PCP: Sinda Du, MD Referring Physician: Larey Dresser, MD  Reason for Consultation: LVAD eval/GOC  HPI/Patient Profile: 63 y.o. male  with past medical history of CHF (EF 20-25%), afib, CAD, s/p [employer] AICD, COPD, diabetes, CKD stage 3, normocytic anemia, h/o colon cancer s/p partial colectomy 2006, h/o alcohol abuse, h/o tobacco abuse (abstinence x 2 weeks) admitted on 11/26/2018 with SOB and swelling to abd and BLE r/t CHF exacerbation and COPD exacerbation.   Clinical Assessment and Goals of Care: I met today with Ramil. Alf shares with me understanding that his lungs are not well enough to tolerate LVAD. He share with me that he is not interested in pursuing LVAD anyway and is clear that his desire is to hope for some level of improvement and return home "to live out my days at home" and with his family. He is very uncomfortably at current time and often feels that he is "smothering" and is unable to lie down and just sits on edge of bed. Also complains of anxiety and insomnia r/t SOB.   Forrest shares with me his philosophy that he has faith that he will live as long as God desires him to live (with or without LVAD). With this in mind he also confirms desire for DNR. He shares that he has a wife of 40+ years, 1 son, and 3 grandchildren and they all live in [place]. He shares that he has not taken care of his body and health as he should and expresses that if he starts to do better than maybe God will give him more time with his family. Prior to admission he reports that he mainly stayed at home and watched television but did not really have much SOB and was no on home oxygen. With the goals that he has expressed about spending his time at home I have introduced the idea  of hospice to assist at home. He is open to hospice at home to help him manage his symptoms and QOL especially with continued symptom burden.   Tibor's wife arrived to the room and I reviewed our above conversation with her with Obi's permission. She agrees with DNR and says that her mother had hospice services. She is overwhelmed and sad but also confirms that she is not surprised to be discussing these decisions.   All questions/concerns addressed. Emotional support provided.   Primary Decision Maker PATIENT    SUMMARY OF RECOMMENDATIONS   - DNR decided - Did not discuss deactivation of AICD today - Consideration of hospice care at home - Not interested in pursuing LVAD - Optimize him medically with hope for improvement so he may enjoy the rest of his life with his family at his home  Code Status/Advance Care Planning:  DNR   Symptom Management:   SOB: Recommend Roxanol 5 mg SL (especially qhs) to allow for some relief of  symptoms (SOB leading to anxiety and insomnia). Recommend trial tonight qhs x 1. With worsening renal function I would avoid morphine.   Palliative Prophylaxis:   Delirium Protocol  Psycho-social/Spiritual:   Desire for further Chaplaincy support:no  Additional Recommendations: Caregiving  Support/Resources and Education on Hospice  Prognosis:   TBD based on progress over next few days. May be nearing eligibility for hospice.   Discharge Planning: To Be Determined      Primary Diagnoses: Present on Admission: . Acute on chronic combined systolic and diastolic congestive heart failure ([place]) . Atrial fibrillation ([place]) . Barrett's esophagus . Arteriosclerotic cardiovascular disease (ASCVD) . Hyperlipidemia . Hypertension . COPD (chronic obstructive pulmonary disease) ([place]) . Tobacco abuse . IDA (iron deficiency anemia) . Elevated troponin . Acute on chronic combined systolic (congestive) and diastolic (congestive) heart failure ([place])   I  have reviewed the medical record, interviewed the patient and family, and examined the patient. The following aspects are pertinent.  Past Medical History:  Diagnosis Date  . Arthritis   . Biventricular ICD (implantable cardioverter-defibrillator) in place   . CAD (coronary artery disease)    Acute MI in 2006 -> LAD stent  . Cardiomyopathy ([place])   . Carpal tunnel syndrome   . Chronic systolic (congestive) heart failure ([place])   . Colon cancer ([place]) 2006   Partial colectomy in 2006  . Complete heart block ([place])    S/P ICD 02/09/13  . COPD (chronic obstructive pulmonary disease) ([place])   . Diabetes mellitus, type II ([place])   . H/O alcohol dependence ([place])   . H/O hiatal hernia   . History of pneumonia 2013  . Hyperlipidemia   . Hypertension   . Myocardial infarction [facility])    Hx: of 2006  . Neuropathy in diabetes ([place])   . PAF (paroxysmal atrial fibrillation) ([facility])    Social History   Socioeconomic History  . Marital status: Married    Spouse name: Not on file  . Number of children: 1  . Years of education: Not on file  . Highest education level: Not on file  Occupational History  . Occupation: Writer    Comment: Disabled  Social Needs  . Financial resource strain: Not on file  . Food insecurity:    Worry: Not on file    Inability: Not on file  . Transportation needs:    Medical: Not on file    Non-medical: Not on file  Tobacco Use  . Smoking status: Current Every Day Smoker    Packs/day: 3.00    Years: 47.00    Pack years: 141.00    Types: Cigarettes    Start date: 07/13/1971    Last attempt to quit: 11/13/2018    Years since quitting: 0.0  . Smokeless tobacco: Never Used  . Tobacco comment: Smoked 2.5-3 ppd at heaviest, smoked since age 26  Substance and Sexual Activity  . Alcohol use: No    Alcohol/week: 0.0 standard drinks  . Drug use: No  . Sexual activity: Yes  Lifestyle  . Physical activity:    Days per week: Not on file    Minutes per session: Not  on file  . Stress: Not on file  Relationships  . Social connections:    Talks on phone: Not on file    Gets together: Not on file    Attends religious service: Not on file    Active member of club or organization: Not on file    Attends meetings of clubs or  organizations: Not on file    Relationship status: Not on file  Other Topics Concern  . Not on file  Social History Narrative  . Not on file   Family History  Problem Relation Age of Onset  . Colon cancer Mother        Demontez Novack, ?>age 20.  [name] Heart disease Mother   . Heart disease Father   . Hypertension Brother   . Heart disease Brother   . Diabetes Brother   . Liver disease Neg Hx    Scheduled Meds: . arformoterol  15 mcg Nebulization BID  . budesonide (PULMICORT) nebulizer solution  0.5 mg Nebulization BID  . feeding supplement (ENSURE ENLIVE)  237 mL Oral BID BM  . ferrous sulfate  325 mg Oral BID  . furosemide  80 mg Intravenous BID  . hydrALAZINE  50 mg Oral Q8H  . insulin aspart  0-20 Units Subcutaneous TID WC  . insulin aspart  0-5 Units Subcutaneous QHS  . insulin aspart  6 Units Subcutaneous TID WC  . insulin glargine  24 Units Subcutaneous Daily  . ipratropium  0.5 mg Nebulization TID  . isosorbide mononitrate  60 mg Oral Daily  . levalbuterol  1.25 mg Nebulization TID  . magnesium oxide  400 mg Oral BID  . mouth rinse  15 mL Mouth Rinse BID  . methylPREDNISolone (SOLU-MEDROL) injection  40 mg Intravenous Daily  . metolazone  2.5 mg Oral Once  . pantoprazole  40 mg Oral QAC breakfast  . primidone  50 mg Oral Daily  . sertraline  50 mg Oral Daily  . simvastatin  40 mg Oral QHS  . sodium chloride flush  10-40 mL Intracatheter Q12H  . sodium chloride flush  3 mL Intravenous Q12H  . spironolactone  25 mg Oral Daily   Continuous Infusions: . sodium chloride    . sodium chloride    . heparin 1,800 Units/hr (12/05/18 0400)  . milrinone     PRN Meds:.sodium chloride, acetaminophen **OR**  acetaminophen, albuterol, nitroGLYCERIN, sodium chloride flush, sodium chloride flush, sodium chloride flush Allergies  Allergen Reactions  . Vancomycin Itching and Rash   Review of Systems  Constitutional: Positive for activity change. Negative for appetite change.  Respiratory: Positive for cough, shortness of breath and wheezing.        Hemoptysis  Cardiovascular: Positive for leg swelling.    Physical Exam Vitals signs and nursing note reviewed.  Constitutional:      Appearance: He is ill-appearing.  Cardiovascular:     Rate and Rhythm: Normal rate.  Pulmonary:     Effort: No tachypnea or accessory muscle usage.     Breath sounds: Rales present.     Comments: Mod distress + cough with bloody sputum Neurological:     Mental Status: He is alert and oriented to person, place, and time.     Vital Signs: BP (!) 147/65 (BP Location: Left Arm)   Pulse 96   Temp 97.8 F (36.6 C) (Oral)   Resp (!) 25   Ht 6' (1.829 m)   Wt 82.1 kg   SpO2 (!) 89%   BMI 24.55 kg/m  Pain Scale: 0-10   Pain Score: 0-No pain   SpO2: SpO2: (!) 89 % O2 Device:SpO2: (!) 89 % O2 Flow Rate: .O2 Flow Rate (L/min): 4 L/min  IO: Intake/output summary:   Intake/Output Summary (Last 24 hours) at 12/05/2018 1053 Last data filed at 12/05/2018 0828 Gross per 24 hour  Intake 251.17 ml  Output 2450 ml  Net -2198.83 ml    LBM: Last BM Date: 12/04/18 Baseline Weight: Weight: 81.6 kg Most recent weight: Weight: 82.1 kg     Palliative Assessment/Data: 50%   Flowsheet Rows     Most Recent Value  Intake Tab  Referral Department  Cardiology  Unit at Time of Referral  Intermediate Care Unit  Palliative Care Primary Diagnosis  Cardiac  Date Notified  12/01/18  Palliative Care Type  New Palliative care  Reason for referral  Other (Comment) [LVAD]  Date of Admission  11/26/18  # of days IP prior to Palliative referral  5  Clinical Assessment  Psychosocial & Spiritual Assessment  Palliative Care  Outcomes      Time In: 1030 Time Out: 1130 Time Total: 60 min Greater than 50%  of this time was spent counseling and coordinating care related to the above assessment and plan.  Signed by: Vinie Sill, NP Palliative Medicine Team Pager # [phone] (M-F 8a-5p) Team Phone # [phone] (Nights/Weekends)

## 2018-12-05 NOTE — Care Management Note (Signed)
Case Management Note Previous Note Created by [facility]  Patient Details  Name: [name] MRN: [id number] Date of Birth: 04/05/1956  Subjective/Objective:     Admitted with CHF. Pt from home, ind with ADL's. Pt has insurance and PCP. Admission last month for same. Refused Home health at that time. Discussed home health RN with patient for disease management, he is agreeable. Discussed [place] program [facility], however patient will not be eligible as he has a pacemaker. Will still order [facility] RN at time of DC.  Patient reports he has a home oxygen concentrator at home, unsure of agency, reports he does not utilize it often.           Has PCP. Will have cardiology follow up. Has transportation.  Action/Plan: DC home with home health RN. Will refer to [facility].  Expected Discharge Date:   unk               Expected Discharge Plan:  [place]  In-House Referral:     Discharge planning Services  CM Consult  Post Acute Care Choice:  Home Health Choice offered to:  Patient  DME Arranged:    DME Agency:     HH Arranged:  RN, Disease Management [place] Agency:  [facility]  Status of Service:  Completed, signed off  If discussed at [place] of Stay Meetings, dates discussed:    Additional Comments: 12/05/2018  Pt remains short of breath, CVP in low teens, remains on milrinone, requiring both IV steroids and lasix.  CM will continue to follow for discharge needs   12/01/18 Pt transferred to [facility].  Pt remains on milrinone drip and IV heparin.  Plans if for Cath on 3/2 and potential LVAD workup. Maryclare Labrador, RN 12/05/2018, 1:49 PM

## 2018-12-05 NOTE — Progress Notes (Signed)
Physical Therapy Treatment Patient Details Name: [name] MRN: [id number] DOB: 21-Sep-1956 Today's Date: 12/05/2018    History of Present Illness Pt adm with progressively worse shortness of breath and found to have acute on chronic heart failure and AKI. Pt developed acute hypoxic respiratory failure due to copd and pulmonary edema. PMH - afib, CAD, osteoarthritis of hands, chronic combined systolic and diastolic heart failure, complete heart block, AICD, carpal tunnel syndrome, colon CA, partial colectomy in 2006, COPD, CAD, type 2 diabetes, diabetic peripheral neuropathy, history of alcohol abuse, GERD, hiatal hernia, hyperlipidemia, hypertension, PNA.     PT Comments    Pt with limited activity tolerance due to respiratory status. Continue to recommend home with family when medically ready.    Follow Up Recommendations  Supervision/Assistance - 24 hour;No PT follow up     Equipment Recommendations  None recommended by PT    Recommendations for Other Services       Precautions / Restrictions Precautions Precautions: Fall    Mobility  Bed Mobility               General bed mobility comments: Pt sitting EOB  Transfers Overall transfer level: Needs assistance Equipment used: None Transfers: Sit to/from Stand Sit to Stand: Supervision         General transfer comment: assist for lines and tubes  Ambulation/Gait Ambulation/Gait assistance: Supervision Gait Distance (Feet): 225 Feet Assistive device: None Gait Pattern/deviations: Step-through pattern;Decreased stride length Gait velocity: decreased Gait velocity interpretation: 1.31 - 2.62 ft/sec, indicative of limited community ambulator General Gait Details: Pt amb on 4L of O2 with SpO2 to 74% by end of amb. Recovered to 87% after ~ 4 minutes   Stairs             Wheelchair Mobility    Modified Rankin (Stroke Patients Only)       Balance Overall balance assessment: No apparent balance  deficits (not formally assessed)                                          Cognition Arousal/Alertness: Awake/alert Behavior During Therapy: WFL for tasks assessed/performed Overall Cognitive Status: Within Functional Limits for tasks assessed                                        Exercises      General Comments        Pertinent Vitals/Pain Pain Assessment: No/denies pain    Home Living                      Prior Function            PT Goals (current goals can now be found in the care plan section) Progress towards PT goals: Progressing toward goals    Frequency    Min 3X/week      PT Plan Discharge plan needs to be updated    Co-evaluation              AM-PAC PT "6 Clicks" Mobility   Outcome Measure  Help needed turning from your back to your side while in a flat bed without using bedrails?: None Help needed moving from lying on your back to sitting on the side of a flat bed without using bedrails?: None  Help needed moving to and from a bed to a chair (including a wheelchair)?: None Help needed standing up from a chair using your arms (e.g., wheelchair or bedside chair)?: None Help needed to walk in hospital room?: A Little Help needed climbing 3-5 steps with a railing? : A Little 6 Click Score: 22    End of Session Equipment Utilized During Treatment: Oxygen Activity Tolerance: Treatment limited secondary to medical complications (Comment)(decr SpO2) Patient left: in bed;with call bell/phone within reach(sitting EOB) Nurse Communication: Mobility status PT Visit Diagnosis: Difficulty in walking, not elsewhere classified (R26.2)     Time: 9892-1194 PT Time Calculation (min) (ACUTE ONLY): 12 min  Charges:  $Gait Training: 8-22 mins                     [place] Pager [phone] [facility] [place] 12/05/2018, 4:16 PM

## 2018-12-06 ENCOUNTER — Encounter (HOSPITAL_COMMUNITY): Payer: Self-pay

## 2018-12-06 ENCOUNTER — Other Ambulatory Visit (HOSPITAL_COMMUNITY): Payer: Self-pay

## 2018-12-06 ENCOUNTER — Inpatient Hospital Stay (HOSPITAL_COMMUNITY): Payer: Medicare HMO

## 2018-12-06 DIAGNOSIS — R042 Hemoptysis: Secondary | ICD-10-CM

## 2018-12-06 LAB — CBC WITH DIFFERENTIAL/PLATELET
Abs Immature Granulocytes: 0.02 10*3/uL (ref 0.00–0.07)
BASOS PCT: 1 %
Basophils Absolute: 0 10*3/uL (ref 0.0–0.1)
EOS ABS: 0.2 10*3/uL (ref 0.0–0.5)
Eosinophils Relative: 4 %
HCT: 23.9 % — ABNORMAL LOW (ref 39.0–52.0)
Hemoglobin: 7.2 g/dL — ABNORMAL LOW (ref 13.0–17.0)
Immature Granulocytes: 0 %
Lymphocytes Relative: 14 %
Lymphs Abs: 0.8 10*3/uL (ref 0.7–4.0)
MCH: 28.9 pg (ref 26.0–34.0)
MCHC: 30.1 g/dL (ref 30.0–36.0)
MCV: 96 fL (ref 80.0–100.0)
Monocytes Absolute: 0.5 10*3/uL (ref 0.1–1.0)
Monocytes Relative: 10 %
Neutro Abs: 4 10*3/uL (ref 1.7–7.7)
Neutrophils Relative %: 71 %
Platelets: 146 10*3/uL — ABNORMAL LOW (ref 150–400)
RBC: 2.49 MIL/uL — ABNORMAL LOW (ref 4.22–5.81)
RDW: 14.8 % (ref 11.5–15.5)
WBC: 5.6 10*3/uL (ref 4.0–10.5)
nRBC: 0 % (ref 0.0–0.2)

## 2018-12-06 LAB — BASIC METABOLIC PANEL
Anion gap: 8 (ref 5–15)
BUN: 55 mg/dL — ABNORMAL HIGH (ref 8–23)
CALCIUM: 8.8 mg/dL — AB (ref 8.9–10.3)
CO2: 35 mmol/L — ABNORMAL HIGH (ref 22–32)
Chloride: 93 mmol/L — ABNORMAL LOW (ref 98–111)
Creatinine, Ser: 1.86 mg/dL — ABNORMAL HIGH (ref 0.61–1.24)
GFR calc Af Amer: 44 mL/min — ABNORMAL LOW (ref >60)
GFR calc non Af Amer: 38 mL/min — ABNORMAL LOW (ref >60)
Glucose, Bld: 183 mg/dL — ABNORMAL HIGH (ref 70–99)
Potassium: 4.5 mmol/L (ref 3.5–5.1)
SODIUM: 136 mmol/L (ref 135–145)

## 2018-12-06 LAB — GLUCOSE, CAPILLARY
GLUCOSE-CAPILLARY: 214 mg/dL — AB (ref 70–99)
Glucose-Capillary: 176 mg/dL — ABNORMAL HIGH (ref 70–99)
Glucose-Capillary: 299 mg/dL — ABNORMAL HIGH (ref 70–99)

## 2018-12-06 LAB — HEPARIN LEVEL (UNFRACTIONATED): Heparin Unfractionated: 0.45 IU/mL (ref 0.30–0.70)

## 2018-12-06 LAB — PREPARE RBC (CROSSMATCH)

## 2018-12-06 LAB — COOXEMETRY PANEL
Carboxyhemoglobin: 2.1 % — ABNORMAL HIGH (ref 0.5–1.5)
Methemoglobin: 1.6 % — ABNORMAL HIGH (ref 0.0–1.5)
O2 Saturation: 80.1 %
Total hemoglobin: 7.3 g/dL — ABNORMAL LOW (ref 12.0–16.0)

## 2018-12-06 LAB — C-REACTIVE PROTEIN: CRP: 1.1 mg/dL — ABNORMAL HIGH (ref <1.0)

## 2018-12-06 LAB — PROTIME-INR
INR: 1.1 (ref 0.8–1.2)
Prothrombin Time: 13.6 seconds (ref 11.4–15.2)

## 2018-12-06 LAB — OCCULT BLOOD X 1 CARD TO LAB, STOOL: Fecal Occult Bld: POSITIVE — AB

## 2018-12-06 LAB — SEDIMENTATION RATE: Sed Rate: 37 mm/hr — ABNORMAL HIGH (ref 0–16)

## 2018-12-06 MED ORDER — SODIUM CHLORIDE 0.9% IV SOLUTION: Freq: Once | INTRAVENOUS | Status: DC

## 2018-12-06 MED ORDER — VANCOMYCIN HCL 10 G IV SOLR
1750.0000 mg | Freq: Once | INTRAVENOUS | Status: AC
  Administered 2018-12-06: 1750 mg via INTRAVENOUS
  Filled 2018-12-06: qty 1750

## 2018-12-06 MED ORDER — METOLAZONE 2.5 MG PO TABS
2.5000 mg | ORAL_TABLET | Freq: Once | ORAL | Status: AC
  Administered 2018-12-06: 2.5 mg via ORAL
  Filled 2018-12-06: qty 1

## 2018-12-06 MED ORDER — METOLAZONE 2.5 MG PO TABS: 2.5000 mg | ORAL_TABLET | Freq: Once | ORAL | Status: DC

## 2018-12-06 MED ORDER — SODIUM CHLORIDE 0.9 % IV SOLN
2.0000 g | INTRAVENOUS | Status: DC
  Administered 2018-12-06 – 2018-12-08 (×3): 2 g via INTRAVENOUS
  Filled 2018-12-06 (×3): qty 2

## 2018-12-06 MED ORDER — MORPHINE SULFATE (CONCENTRATE) 10 MG/0.5ML PO SOLN
5.0000 mg | Freq: Every day | ORAL | Status: DC
  Administered 2018-12-06 – 2018-12-08 (×3): 5 mg via SUBLINGUAL
  Filled 2018-12-06 (×3): qty 0.5

## 2018-12-06 MED ORDER — VANCOMYCIN HCL 10 G IV SOLR
1250.0000 mg | INTRAVENOUS | Status: DC
  Administered 2018-12-07 – 2018-12-08 (×2): 1250 mg via INTRAVENOUS
  Filled 2018-12-06 (×2): qty 1250

## 2018-12-06 MED ORDER — FUROSEMIDE 10 MG/ML IJ SOLN
15.0000 mg/h | INTRAVENOUS | Status: DC
  Administered 2018-12-06 – 2018-12-07 (×3): 15 mg/h via INTRAVENOUS
  Filled 2018-12-06 (×2): qty 25
  Filled 2018-12-06: qty 21
  Filled 2018-12-06: qty 25
  Filled 2018-12-06: qty 2

## 2018-12-06 MED ORDER — MORPHINE SULFATE (CONCENTRATE) 10 MG/0.5ML PO SOLN
2.5000 mg | Freq: Every day | ORAL | Status: DC | PRN
  Administered 2018-12-07: 2.6 mg via SUBLINGUAL
  Filled 2018-12-06: qty 0.5

## 2018-12-06 MED ORDER — FUROSEMIDE 10 MG/ML IJ SOLN
15.0000 mg/h | INTRAVENOUS | Status: DC
  Filled 2018-12-06: qty 25

## 2018-12-06 NOTE — Progress Notes (Signed)
Pharmacy Antibiotic Note  [name] is a 63 y.o. male admitted on 11/26/2018 with possible pna.  Afebrile, WBC wnl Cxr possible pna R>L   Pharmacy has been consulted for vancomycin and cefepime dosing. Vancomycin 1250 mg IV Q 24 hrs. Goal AUC 400-550. 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    Allergies  Allergen Reactions  . Vancomycin Itching and Rash    Antimicrobials this admission:   Dose adjustments this admission:   Microbiology results:   Bonnita Nasuti Pharm.D. CPP, BCPS Clinical Pharmacist [phone] 12/06/2018 1:49 PM

## 2018-12-06 NOTE — Progress Notes (Signed)
Palliative:  [name] is sitting on side of bed. Continues with hemoptysis over the past 2-3 days (pulmonary to re-consult). He reports that he slept well last night with trial of Roxanol - will continue dose qhs. RN reports no oversedation, hypoxia, or concern with hypercarbia overnight. He did have a incident of SOB/hypoxia this morning d/t anxiety in which he recovered quickly. He continues to hope for some improvement but knows he continues to be very ill. Beginning Lasix infusion today. He reports no questions or concerns from himself or his wife regarding [place] conversation yesterday. DNR decided yesterday. Hopeful for some level of improvement and to return to his home. Emotional support provided.   Discussed with Dr. Aundra Dubin.   Exam: Alert, oriented. Mild-mod SOB at rest, cannot lie down flat. BLE edema. + hemoptysis.   Plan: - Roxanol 5 mg SL qhs to assist with SOB that is leading to insomnia.  - Roxanol 2.5 mg SL once daily prn for SOB.  - Goals are hopeful for some level of improvement to live out his days at home with his family. Not interested in LVAD.  - I will continue to follow and support.   20 min  Vinie Sill, NP Palliative Medicine Team Pager # [phone] (M-F 8a-5p) Team Phone # [phone] (Nights/Weekends)

## 2018-12-06 NOTE — Progress Notes (Signed)
Inpatient Diabetes Program Recommendations  AACE/ADA: New Consensus Statement on Inpatient Glycemic Control (2015)  Target Ranges:  Prepandial:   less than 140 mg/dL      Peak postprandial:   less than 180 mg/dL (1-2 hours)      Critically ill patients:  140 - 180 mg/dL   Lab Results  Component Value Date   GLUCAP 214 (H) 12/06/2018   HGBA1C 7.7 (H) 12/01/2018    Review of Glycemic Control Results for GARLAN, DREWES (MRN [id number]) as of 12/06/2018 10:46  Ref. Range 12/05/2018 06:03 12/05/2018 11:37 12/05/2018 16:24 12/05/2018 21:39 12/06/2018 06:44  Glucose-Capillary Latest Ref Range: 70 - 99 mg/dL 186 (H) Given @ 0943 Nov 10 units 129 (H)  Nov 9 units 368 (H) Nov 26 units 360 (H) Nov 5 units 214 (H) Nov 13 units   Inpatient Diabetes Program Recommendations:   While on steroids: -Increase Novolog meal coverage to 10 units tid if eats 50%  Thank you, Nani Gasser. Elisandro Jarrett, RN, MSN, CDE  Diabetes Coordinator Inpatient Glycemic Control Team Team Pager [phone] (8am-5pm) 12/06/2018 10:51 AM

## 2018-12-06 NOTE — Discharge Instructions (Signed)

## 2018-12-06 NOTE — Progress Notes (Signed)
NAME:  [name], MRN:  [id number], DOB:  09/04/1956, LOS: 9 ADMISSION DATE:  11/26/2018, CONSULTATION DATE:  3/2 REFERRING MD:  Dr. Aundra Dubin, CHIEF COMPLAINT:  Shortness of Breath    Brief History   63 y/o M admitted 2/23 to [facility] with worsening SOB, LE swelling.  Transferred to [facility] 2/26 for evaluation by CHF service.  Pt unable to lie flat for LHC 3/2, PCCM consulted for dyspnea.   History of present illness   63 y/o M who presented 2/23 to [facility] ER via EMS with reports of gradual worsening of shortness of breath.  The patient reported on admit he had noted swelling increasing from his feet to his abdomen.    He was initially admitted per TRH to [facility].  Initial work up notable for AKI with serum creatinine of 1.66 / BUN 31, troponin 0.06, BNP 1629.  CXR demonstrated cardiomegaly, interstitial edema and right pleural effusion.  PICC line placed and diuresis attempted. Initial Co-ox 61% but dropped to 49% 2/26 with low output CHF and was transferred to [facility] 2/26 for further evaluation.   On arrival to [facility] SDU, he was noted to have increased work of breathing and a new O2 need that required NRB.  I/O's were in question.  He was placed on milrinone, heparin gtt.  He was intermittently diuresed.  He was taken to the cath lab for assessment but was unable to lie flat for procedure.  PFT's were evaluated 2/28 with FEV1 1.33 (35%), FVC 1.75 (34%), Ratio 76%, DLCO 33%. Further, the patient was evaluated by CVTS for VAD placement and felt his pulmonary status would preclude sternotomy & VAD implantation.  ECHO 2/29 with LVEF 20-25%, moderate MR, normal RV size / systolic function, PASP 52 mmHg.  CT chest evaluation on 2/29 notable for mild emphysema, atelectasis, R>L pleural effusions.   On 3/2, the patient remained on 4L/[place] and PCCM consulted for evaluation of dyspnea. He currently reports no fevers, sputum production (does not normally produce sputum).  Indicates progressively worsening swelling / SOB brought  him into the hospital.  States he quit smoking 2 weeks ago.  Began smoking age 81, smoked up to 2.5 to 3ppd.  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    Past Medical History  Mixed Ischemic / non-ischemic cardiomyopathy Chronic AF on Coumadin  Subtotal colectomy for large tubular adenoma at ileocecal valve  Chronic diarrhea because of colon resection Barrett's Esophagitis at [place] junction Former Heavy ETOH, quit 2006  [place] [facility]   2/23  Admit to [facility] with SOB, swelling up to abdomen  2/26  Transferred to [facility] for CHF evaluation  3/02  PCCM consulted for SOB.  Pt unable to lie flat for LHC  Consults:  PCCM 3/2   Procedures:    Significant Diagnostic Tests:  CT Chest 2/29 >> mild cardiomegaly, bilateral effusions with interstitial thickening in the lungs & patchy hazy airspace opacities consistent with pulmonary edema, bilateral lower lobe, RML, LUL lingula atelectasis  CT Abd/Pelvis 2/29 >> no acute findings, trace ascites  Micro Data:    Antimicrobials:     Interim history/subjective:  Hemoptysis overnight  Objective   Blood pressure 127/66, pulse 99, temperature 98.6 F (37 C), temperature source Oral, resp. rate (!) 21, height 6' (1.829 m), weight 83.3 kg, SpO2 95 %. CVP:  [11 mmHg-16 mmHg] 16 mmHg  FiO2 (%):  [36 %] 36 %   Intake/Output Summary (Last 24 hours) at 12/06/2018 1055 Last data filed  at 12/06/2018 8250 Gross per 24 hour  Intake 630 ml  Output 750 ml  Net -120 ml   Filed Weights   12/04/18 0639 12/05/18 0500 12/06/18 0328  Weight: 81.3 kg 82.1 kg 83.3 kg    Examination: General: Chronically ill appearing male, NAD HEENT: [place]/AT, PERRL, EOM-I and MMM Neuro: Alert and interactive, moving all ext to command CV: RRR, Nl S1/S2 and -M/R/G PULM: Bibasilar crackles R>L GI: Soft, NT, ND and +BS Extremities: warm/dry, pitting BLE edema, LE's wrapped   Skin: no rashes or lesions  I reviewed CXR myself, R>L infiltrate  Resolved  Hospital Problem list     Assessment & Plan:   Acute Hypoxemic Respiratory Failure Combination of COPD and pulmonary edema COPD  P: Titrate O2 for sat of 88-92% D/C BiPAP Flutter valve / incentive spirometry for atelectasis  Mobilize / OOB Hold Dulera, likely not strong enough to adequately inhale  Brovana + Pulmicort BID  Xopenex / atrovent TID > likely can make PRN 3/3 Reduce solumedrol to 40 mg IV QD, would D/C after 5 day course Active diureses as renal function allows  Bilateral Pleural Effusion  -R>L, small to moderate P: F/U CXR Monitor effusions with diuresis  No thora  Acute on Chronic sCHF / Ischemic Cardiomyopathy  P: Per CHF Service  Follow co-ox Milrinone drip Lasix drip   Hemoptysis: DAH vs airway trauma P: Ideally would like to get a CT of the chest but patient can not lay down flat Will check ESR and CRP, if elevated then will start high dose steroids Would like to bronch but will likely end up intubated (now DNR)  Labs   CBC: Recent Labs  Lab 12/02/18 0515 12/03/18 0511 12/04/18 0557 12/05/18 0420 12/06/18 0347  WBC 4.6 3.6* 5.4 6.0 5.6  NEUTROABS 3.3 2.4 4.0 4.6 4.0  HGB 8.2* 7.6* 7.9* 7.5* 7.2*  HCT 26.9* 25.5* 25.6* 25.1* 23.9*  MCV 96.8 96.6 97.0 97.3 96.0  PLT 177 146* 169 158 146*    Basic Metabolic Panel: Recent Labs  Lab 11/30/18 0430  12/02/18 0515 12/03/18 0511 12/04/18 0557 12/05/18 0420 12/06/18 0347  NA 132*   < > 133* 134* 134* 135 136  K 4.5   < > 4.1 3.9 4.4 4.4 4.5  CL 90*   < > 91* 92* 93* 93* 93*  CO2 33*   < > 34* 34* 34* 35* 35*  GLUCOSE 322*   < > 293* 264* 201* 173* 183*  BUN 43*   < > 43* 38* 40* 49* 55*  CREATININE 1.84*   < > 1.71* 1.56* 1.65* 1.86* 1.86*  CALCIUM 8.6*   < > 8.5* 8.1* 8.5* 8.8* 8.8*  MG 2.0  --   --   --   --   --   --    < > = values in this interval not displayed.   GFR: Estimated Creatinine Clearance: 45.2 mL/min (A) (by C-G formula based on SCr of 1.86 mg/dL (H)). Recent Labs    Lab 12/03/18 0511 12/04/18 0557 12/05/18 0420 12/06/18 0347  WBC 3.6* 5.4 6.0 5.6    Liver Function Tests: No results for input(s): AST, ALT, ALKPHOS, BILITOT, PROT, ALBUMIN in the last 168 hours. No results for input(s): LIPASE, AMYLASE in the last 168 hours. No results for input(s): AMMONIA in the last 168 hours.  ABG    Component Value Date/Time   PHART 7.398 12/01/2018 1710   PCO2ART 58.2 (H) 12/01/2018 1710   PO2ART 72.5 (L)  12/01/2018 1710   HCO3 35.2 (H) 12/01/2018 1710   TCO2 28 01/26/2014 0650   ACIDBASEDEF 17.2 (H) 09/04/2018 1515   O2SAT 80.1 12/06/2018 0330     Coagulation Profile: Recent Labs  Lab 12/02/18 0515 12/03/18 0511 12/04/18 0557 12/05/18 0420 12/06/18 0347  INR 1.3* 1.2 1.2 1.1 1.1    Cardiac Enzymes: No results for input(s): CKTOTAL, CKMB, CKMBINDEX, TROPONINI in the last 168 hours.  HbA1C: Hgb A1c MFr Bld  Date/Time Value Ref Range Status  12/01/2018 04:06 PM 7.7 (H) 4.8 - 5.6 % Final    Comment:    (NOTE) Pre diabetes:          5.7%-6.4% Diabetes:              >6.4% Glycemic control for   <7.0% adults with diabetes   09/04/2018 03:24 PM 7.3 (H) 4.8 - 5.6 % Final    Comment:    (NOTE) Pre diabetes:          5.7%-6.4% Diabetes:              >6.4% Glycemic control for   <7.0% adults with diabetes     CBG: Recent Labs  Lab 12/05/18 0603 12/05/18 1137 12/05/18 1624 12/05/18 2139 12/06/18 0644  GLUCAP 186* 129* 368* 360* 214*    Review of Systems: Positives in [place]   Gen: Denies fever, chills, weight change, fatigue, night sweats HEENT: Denies blurred vision, double vision, hearing loss, tinnitus, sinus congestion, rhinorrhea, sore throat, neck stiffness, dysphagia PULM: Denies shortness of breath, cough, sputum production, hemoptysis, wheezing CV: Denies chest pain, edema, orthopnea, paroxysmal nocturnal dyspnea, palpitations GI: Denies abdominal pain, nausea, vomiting, "chronic" diarrhea, hematochezia, melena,  constipation, change in bowel habits GU: Denies dysuria, hematuria, polyuria, oliguria, urethral discharge Endocrine: Denies hot or cold intolerance, polyuria, polyphagia or appetite change Derm: Denies rash, dry skin, scaling or peeling skin change Heme: Denies easy bruising, bleeding, bleeding gums Neuro: Denies headache, numbness, weakness, slurred speech, loss of memory or consciousness    Past Medical History  He,  has a past medical history of Arthritis, Biventricular ICD (implantable cardioverter-defibrillator) in place, CAD (coronary artery disease), Cardiomyopathy ([place]), Carpal tunnel syndrome, Chronic systolic (congestive) heart failure ([place]), Colon cancer ([place]) (2006), Complete heart block ([place]), COPD (chronic obstructive pulmonary disease) ([place]), Diabetes mellitus, type II ([place]), H/O alcohol dependence ([place]), H/O hiatal hernia, History of pneumonia (2013), Hyperlipidemia, Hypertension, Myocardial infarction ([place]), Neuropathy in diabetes ([place]), and PAF (paroxysmal atrial fibrillation) ([place]).   Surgical History    Past Surgical History:  Procedure Laterality Date  . ANKLE SURGERY     Right  . Biventricular ICD Placement  02/09/2013   Dr. Lovena Le  . COLONOSCOPY  08/17/2005   Pancolonic diverticula/Multiple rectal polyps removed as described above. The larger of the two likely responsible for intermittent hematochezia/ Multiple polyps at the hepatic flexure resected with a snare./ Large polypoid lesion growing out of the base of the cecum, just behind the  ileocecal valve. This was not felt to be amendable to endoscopic resection. Biopsied multiple times  . CORONARY ANGIOPLASTY WITH STENT PLACEMENT    . ESOPHAGOGASTRODUODENOSCOPY N/A 03/29/2017   Barrett's esophagus, gastritis, duodenitis, focal glandular atypia on biopsy. Needs surveillance June 2019   . FLEXIBLE SIGMOIDOSCOPY    06/13/2006   Essentially normal residual rectum status post total colectomy. anastomosis at 25cm.  Focal  area of abnormality adjacent to suture, as described above, likely not significant, suspect granulation tissue, biopsied.  Anastomosis otherwise appeared normal.  Distal terminal ileum appeared normal  . FLEXIBLE SIGMOIDOSCOPY N/A 01/13/2015   Dr. Gala Romney: normal-appearing residual rectum, surgical anastomosis, s/p subtotal colectomy. Surveillance 5 years   . FLEXIBLE SIGMOIDOSCOPY N/A 03/29/2017   Dr. Oneida Alar while inpatient: intermittent rectal bleeding due to ischemic erosion at anastomosis,s/p APC therapy. one 3 mm polyp in recum (hyperplastic). surveillance in 3 years  . INGUINAL HERNIA REPAIR    . MULTIPLE EXTRACTIONS WITH ALVEOLOPLASTY N/A 04/09/2013   Procedure: Extraction of tooth #'s 1,2,4,5,6,7,8,9,10,11,12,13,17,18,20,21,22,23,27,28, 29,30,31, and 32 with alveoloplasty.;  Surgeon: Lenn Cal, DDS;  Location: [facility];  Service: Oral Surgery;  Laterality: N/A;  . PERMANENT PACEMAKER INSERTION N/A 02/09/2013   Procedure: PERMANENT PACEMAKER INSERTION;  Surgeon: Evans Lance, MD;  Location: [facility] CATH LAB;  Service: Cardiovascular;  Laterality: N/A;  . SUBTOTAL COLECTOMY  09/01/2005   subtotal colectomy  . TEMPORARY PACEMAKER INSERTION Right 02/09/2013   Procedure: TEMPORARY PACEMAKER INSERTION;  Surgeon: Sinclair Grooms, MD;  Location: [facility] CATH LAB;  Service: Cardiovascular;  Laterality: Right;     Social History   reports that he has been smoking cigarettes. He started smoking about 47 years ago. He has a 141.00 pack-year smoking history. He has never used smokeless tobacco. He reports that he does not drink alcohol or use drugs.   Family History   His family history includes Colon cancer in his mother; Diabetes in his brother; Heart disease in his brother, father, and mother; Hypertension in his brother. There is no history of Liver disease.   Allergies Allergies  Allergen Reactions  . Vancomycin Itching and Rash     Home Medications  Prior to Admission medications   Medication Sig  Start Date End Date Taking? Authorizing Provider  acetaminophen (TYLENOL) 500 MG tablet Take 500 mg by mouth every 6 (six) hours as needed for mild pain or moderate pain.  01/08/14  Yes [provider]  albuterol (PROVENTIL) (2.5 MG/3ML) 0.083% nebulizer solution Take 2.5 mg by nebulization every 6 (six) hours as needed. For shortness of breath   Yes [provider]  ferrous sulfate 325 (65 FE) MG EC tablet Take 325 mg by mouth 2 (two) times daily.   Yes [provider]  insulin glargine (LANTUS) 100 UNIT/ML injection Inject 20 Units into the skin daily as needed (for high blood sugar).    Yes [provider]  Lactobacillus (ACIDOPHOLUS PO) Take 1 tablet by mouth daily.   Yes [provider]  magnesium oxide (MAG-OX) 400 MG tablet Take 400 mg by mouth 2 (two) times daily.   Yes [provider]  metolazone (ZAROXOLYN) 2.5 MG tablet Take 2.5 mg (1 tablet) on Monday, Wednesday & Friday 30 minutes prior to taking torsemide. 11/15/18  Yes Evans Lance, MD  Multiple Vitamin (MULTIVITAMIN) tablet Take 1 tablet by mouth daily.   Yes [provider]  pantoprazole (PROTONIX) 40 MG tablet Take 1 tablet (40 mg total) by mouth daily before breakfast. 03/31/17  Yes Sinda Du, MD  primidone (MYSOLINE) 50 MG tablet Take 50 mg by mouth daily.    Yes [provider]  sertraline (ZOLOFT) 50 MG tablet Take 50 mg by mouth daily.   Yes [provider]  simvastatin (ZOCOR) 40 MG tablet Take 40 mg by mouth at bedtime.    Yes [provider]  torsemide (DEMADEX) 20 MG tablet Take 2 tablets (40 mg total) by mouth 2 (two) times daily. 11/21/18 03/03/2019 Yes Evans Lance, MD  warfarin (COUMADIN) 4  MG tablet Take 3 tablets daily or as directed Patient taking differently: Take 12 mg by mouth every evening.  09/18/18  Yes Branch, Alphonse Guild, MD  albuterol (PROVENTIL HFA;VENTOLIN HFA) 108 (90 BASE) MCG/ACT inhaler Inhale 2 puffs into the  lungs every 6 (six) hours as needed. For shortness of breath    [provider]  nitroGLYCERIN (NITROSTAT) 0.4 MG SL tablet Place 1 tablet (0.4 mg total) under the tongue every 5 (five) minutes x 3 doses as needed. For chest pain 03/08/18   Evans Lance, MD  ondansetron (ZOFRAN) 4 MG tablet Take 4 mg by mouth 4 (four) times daily as needed for nausea or vomiting.    [provider]    Rush Farmer, M.D. [facility] Pulmonary/Critical Care Medicine. Pager: [phone]. After hours pager: [phone].

## 2018-12-06 NOTE — Progress Notes (Signed)
Patient ID: [name], male   DOB: [birth year], 63 y.o.   MRN: [id number]     Advanced Heart Failure Rounding Note  PCP-Cardiologist: Carlyle Dolly, MD   Subjective:    Still very short of breath, required NRBM overnight, now on 5L [place].  Hemoptysis overnight. Hgb down to 7.2 today despite getting 1 unit PRBCs on 3/3.  He remains afebrile with normal WBCs.  CVP 15-16, UOP not vigorous.  Co-ox 81% with creatinine steady at 1.86.   PFTs 12/01/18: Severe obstruction FEV1 1.33 (35%) FVC   1.75 (34%) Ratio 76% DLCO 33%  Seen by Dr. Ron Agee 2/29. Felt not to be candidate for VAD at this point due to pulmonary status   Echo: EF 20-25%, moderate MR, normal RV size/systolic function, PASP 52 mmHg.   CT chest showed pulmonary edema, mild emphysema.   Objective:   Weight Range: 83.3 kg Body mass index is 24.91 kg/m.   Vital Signs:   Temp:  [97.4 F (36.3 C)-98.6 F (37 C)] 98.6 F (37 C) (03/04 0651) Pulse Rate:  [91-107] 99 (03/04 0651) Resp:  [15-31] 21 (03/04 0651) BP: (123-147)/(46-66) 127/66 (03/04 0651) SpO2:  [86 %-98 %] 95 % (03/04 0734) FiO2 (%):  [36 %] 36 % (03/03 1441) Weight:  [83.3 kg] 83.3 kg (03/04 0328) Last BM Date: 12/04/18  Weight change: Filed Weights   12/04/18 0639 12/05/18 0500 12/06/18 0328  Weight: 81.3 kg 82.1 kg 83.3 kg    Intake/Output:   Intake/Output Summary (Last 24 hours) at 12/06/2018 0846 Last data filed at 12/06/2018 0412 Gross per 24 hour  Intake 630 ml  Output 750 ml  Net -120 ml     Physical Exam    General: NAD Neck: JVP 13-14 cm, no thyromegaly or thyroid nodule.  Lungs: Rhonchi bilaterally.  CV: Nondisplaced PMI.  Heart regular S1/S2, no S3/S4, 1/6 SEM.  1+ edema to thighs.   Abdomen: Soft, nontender, no hepatosplenomegaly, no distention.  Skin: Intact without lesions or rashes.  Neurologic: Alert and oriented x 3.  Psych: Normal affect. Extremities: No clubbing or cyanosis.  HEENT: Normal.    Telemetry   NSR BiV  paced 100s, personally reviewed.   EKG    No new tracings.    Labs    CBC Recent Labs    12/05/18 0420 12/06/18 0347  WBC 6.0 5.6  NEUTROABS 4.6 4.0  HGB 7.5* 7.2*  HCT 25.1* 23.9*  MCV 97.3 96.0  PLT 158 245*   Basic Metabolic Panel Recent Labs    12/05/18 0420 12/06/18 0347  NA 135 136  K 4.4 4.5  CL 93* 93*  CO2 35* 35*  GLUCOSE 173* 183*  BUN 49* 55*  CREATININE 1.86* 1.86*  CALCIUM 8.8* 8.8*   Liver Function Tests No results for input(s): AST, ALT, ALKPHOS, BILITOT, PROT, ALBUMIN in the last 72 hours. No results for input(s): LIPASE, AMYLASE in the last 72 hours. Cardiac Enzymes No results for input(s): CKTOTAL, CKMB, CKMBINDEX, TROPONINI in the last 72 hours.  BNP: BNP (last 3 results) Recent Labs    09/04/18 1517 10/16/18 1536 11/26/18 1645  BNP 605.0* 2,147.0* 1,629.0*   ProBNP (last 3 results) No results for input(s): PROBNP in the last 8760 hours.  D-Dimer No results for input(s): DDIMER in the last 72 hours. Hemoglobin A1C No results for input(s): HGBA1C in the last 72 hours. Fasting Lipid Panel No results for input(s): CHOL, HDL, LDLCALC, TRIG, CHOLHDL, LDLDIRECT in the last 72 hours. Thyroid Function Tests  No results for input(s): TSH, T4TOTAL, T3FREE, THYROIDAB in the last 72 hours.  Invalid input(s): FREET3  Other results:  Imaging   No results found.  Medications:    Scheduled Medications: . arformoterol  15 mcg Nebulization BID  . budesonide (PULMICORT) nebulizer solution  0.5 mg Nebulization BID  . feeding supplement (ENSURE ENLIVE)  237 mL Oral BID BM  . ferrous sulfate  325 mg Oral BID  . furosemide  80 mg Intravenous BID  . hydrALAZINE  50 mg Oral Q8H  . insulin aspart  0-20 Units Subcutaneous TID WC  . insulin aspart  0-5 Units Subcutaneous QHS  . insulin aspart  6 Units Subcutaneous TID WC  . insulin glargine  24 Units Subcutaneous Daily  . ipratropium  0.5 mg Nebulization TID  . isosorbide mononitrate  60 mg  Oral Daily  . levalbuterol  1.25 mg Nebulization TID  . magnesium oxide  400 mg Oral BID  . mouth rinse  15 mL Mouth Rinse BID  . methylPREDNISolone (SOLU-MEDROL) injection  40 mg Intravenous Daily  . pantoprazole  40 mg Oral QAC breakfast  . primidone  50 mg Oral Daily  . sertraline  50 mg Oral Daily  . simvastatin  40 mg Oral QHS  . sodium chloride flush  10-40 mL Intracatheter Q12H  . sodium chloride flush  3 mL Intravenous Q12H  . spironolactone  25 mg Oral Daily    Infusions: . sodium chloride    . sodium chloride 10 mL/hr at 12/05/18 1058  . heparin 1,800 Units/hr (12/06/18 0211)  . milrinone 0.25 mcg/kg/min (12/06/18 0317)    PRN Medications: sodium chloride, acetaminophen **OR** acetaminophen, albuterol, nitroGLYCERIN, sodium chloride flush, sodium chloride flush, sodium chloride flush  Patient Profile   [name] is a 63 y.o. male  with h/o Paroxysmal Afib, CAD, OA of both hands, chronic combined systolic/diastolic CHF, CHB s/p [employer] BiV ICD, Carpal Tunnel syndrome, h/o Colon CA, COPD, DM2, peripheral neuropathy, ETOH abuse, GERD, HLD, HTN, and h/o tobacco abuse.   Admitted 11/26/2018 with worsening DOE and edema despite adjustment of outpatient diuretics. Moved to [facility] 11/29/18 with low output by PICC line.   Assessment/Plan   1. Acute on chronic systolic CHF: Ischemic cardiomyopathy. [employer] CRT-D device.   Echo in 12/19 with EF 20-25%, moderately dilated RV with mildly decreased systolic function, mild AS, moderate MR, moderate TR, PASP 50 mmHg.  Echo this admission with EF 20-25%, normal RV, moderate MR, PASP 52 mmHg.  PICC in place, co-ox suggested low output at 49%.  We have discussed LVAD with him and he was seen by Dr. Prescott Gum.  Not thought to be a candidate at this point due to severe COPD on PFTs (though may be possible in future if PFTs improve off cigarettes for longer term and with diuresis => CT chest did not show impressive emphysema),  however patient also does not think he would want LVAD.  Now on milrinone 0.25, co-ox 81% this morning.  CVP 15-16 today. He did not diurese well yesterday and remains short of breath. Creatinine fairly stable today.  - Continue milrinone 0.25 for now.  - Will restart Lasix gtt at 15 mg/hr and will give 1 dose of metolazone 2.5 x 1.  - BP stable, continue hydralazine at 50 mg TID and Imdur 60 mg daily. Can attempt to transition to losartan/Entresto if renal function stabilizes eventually.  - Continue spironolactone 25 mg daily.  - With rising creatinine and still unable  to lie back due to orthopnea, no cath yet.  Will need RHC/LHC in future.  2. CAD: Anterior MI in 2006 with PCI to LAD.  DES for in-stent restenosis in 2007.  No cath/intervention since that time. No chest pain.   - Given warfarin anticoagulation, he is not on ASA. Was on heparin gtt pending cath but will stop heparin today with hemoptysis.   - Continue Zocor.  - Ideally needs coronary angiography but wait until creatinine stabilizes and he can lie flat.  3. AKI on CKD stage 3:  Cr 1.7 -> 1.84 -> 1.65 -> 1.7 -> 1.56 -> 1.65 -> 1.86 -> 1.86. Suspect cardiorenal syndrome. Still volume overloaded, as above adjusting diuretics.  Hopefully, as renal venous pressure drops, creatinine will come down.   4. COPD: He recently quit smoking.  Apparently, home oxygen was recommended 7 years ago. Severe COPD by PFTs  2/28. FEV1 1.33 (35%) DLCO 33%. However, CT chest last week was more consistent with mild residual pulmonary edema and showed only mild emphysema.  Still very short of breath and sitting on side of bed.  Suspect COPD exacerbation may be playing a significant role in current symptoms. He is afebrile, normal WBCs but now with hemoptysis.  - Continue Solumedrol 40 mg IV daily and nebs. - Stop heparin gtt with hemoptysis.  - Will get CXR this morning and add HCAP coverage with vancomycin/cefepime.  - ?Bronchoscopy if he continues to  hemoptysize.     5. Type II diabetes: Diabetes coordinator following, increased insulin with steroids.    6. Atrial fibrillation: Paroxysmal. He has been in NSR.  He was on heparin gtt and off warfarin but with hemoptysis and falling hgb, will stop heparin gtt.  7. Severe protein-calorie malnutrition - encouraged po intake - start Ensure 8. Anemia:  No overt GI bleeding.  Not Fe deficient. He got 1 unit PRBCs on 3/3.  He has more hemoptysis this morning.  - Transfuse 1 more unit PRBCs today.  - Send FOBT.  - Stop IV heparin with hemoptysis.   CRITICAL CARE Performed by: Loralie Champagne  Total critical care time: 35 minutes  Critical care time was exclusive of separately billable procedures and treating other patients.  Critical care was necessary to treat or prevent imminent or life-threatening deterioration.  Critical care was time spent personally by me on the following activities: development of treatment plan with patient and/or surrogate as well as nursing, discussions with consultants, evaluation of patient's response to treatment, examination of patient, obtaining history from patient or surrogate, ordering and performing treatments and interventions, ordering and review of laboratory studies, ordering and review of radiographic studies, pulse oximetry and re-evaluation of patient's condition.  Length of Stay: 9  Loralie Champagne, MD  12/06/2018, 8:46 AM  Advanced Heart Failure Team Pager [phone] (M-F; 7a - 4p)  Please contact [facility] for night-coverage after hours (4p -7a ) and weekends on [email or web address]

## 2018-12-06 NOTE — Progress Notes (Signed)
Nutrition Follow-up  DOCUMENTATION CODES:   Severe malnutrition in context of chronic illness  INTERVENTION:   -Continue MVI with minerals daily -Continue Magic cup BID with meals, each supplement provides 290 kcal and 9 grams of protein -Continue Ensure Enlive po BID, each supplement provides 350 kcal and 20 grams of protein -Continue snacks TID between meals  NUTRITION DIAGNOSIS:   Severe Malnutrition related to chronic illness(CHF, COPD, hx of colon cancer s/p colectomy with chronic diarrhea, tobacco abuse) as evidenced by energy intake < 75% for > or equal to 1 month, severe muscle depletion, severe fat depletion.  Ongoing  GOAL:   Patient will meet greater than or equal to 90% of their needs  Progressing   MONITOR:   PO intake, Supplement acceptance, Labs, Weight trends  REASON FOR ASSESSMENT:   Consult LVAD Eval  ASSESSMENT:   63 yo male admitted in respiratory distress with pulmonary edema and low cardiac output; pt with acute on chronic systolic CHF with ischemic CM, AKI on CKD III. Consult received for pre-LVAD evaluation. PMH includes CAD, acute MI in 2006 with LAD stent, CM, chronic systolic CHF, colon cancer s/p partial colectomy on 2006, COPD, DM, HTN, HLD  Reviewed I/O's: -270 ml x 24 hours and -13.3 L since admission  Pt sleeping soundly at time of visit. RD did not disturb.   Observed pt eating lunch yesterday without difficulty. Pt with good appetite and is consuming 75-100% of all meals. Empty Ensure Enlive container noted at bedside. Pt with malnutrition and would benefit from nutrient dense supplement. One Ensure Enlive supplement provides 350 kcals, 20 grams protein, and 44-45 grams of carbohydrate vs one Glucerna shake supplement, which provides 220 kcals, 10 grams of protein, and 26 grams of carbohydrate. Given pt's hx of DM, RD will continue to monitor PO intake, CBGS, and adjust supplement regimen as appropriate.   Palliative care team following  pt. Pt has decided not to pursue LVAD placement and there have been discussions regarding returning home with hospice services once medically optimized.   Lab Results  Component Value Date   HGBA1C 7.7 (H) 12/01/2018   PTA DM medications are 20 units insulin glargine daily.   Labs reviewed: CBGS: 214 (inpatient orders for glycemic control are 0-20 units insulin aspart TID with meals, 0-5 units insulin aspart q HS, 6 units insulin aspart TID with meals, and 24 units insulin glargine daily). Pt also on sol-medrol, which may be contributing to elevated CBGS.   Diet Order:   Diet Order            Diet 2 gram sodium Room service appropriate? Yes; Fluid consistency: Thin  Diet effective now              EDUCATION NEEDS:   Education needs have been addressed  Skin:  Skin Assessment: Reviewed RN Assessment  Last BM:  12/04/18  Height:   Ht Readings from Last 1 Encounters:  12/04/18 6' (1.829 m)    Weight:   Wt Readings from Last 1 Encounters:  12/06/18 83.3 kg    Ideal Body Weight:  80.9 kg  BMI:  Body mass index is 24.91 kg/m.  Estimated Nutritional Needs:   Kcal:  2050-2250 kcals   Protein:  105-115 g  Fluid:  1200 mL fluid restricted    Lorali Khamis A. Jimmye Norman, RD, LDN, [place] Registered Dietitian II Certified Diabetes Care and Education Specialist Pager: [phone] After hours Pager: [phone]

## 2018-12-06 NOTE — Plan of Care (Addendum)
  Problem: Safety: Goal: Ability to remain free from injury will improve Outcome: Progressing   Problem: Education: Goal: Individualized Educational Video(s) Outcome: Not Applicable    Pt started on lasix gtt today.  UOP 900 + 1 count in toilet with BM. BM x2, occult card sent per order. Pt states he is able to lay down more comfortably today (HOB 20). SpO2 93% on 8LPM, humidified HFNC. Requires 13 LPM after ambulation to BR. Pt has bloody sputum-MD aware, RN noted amount has decreased after stopping heparin this shift. Started on vanc and cefepime IV, no complications. Milrinone continued. Infusing 1 unit PRBC after abx completed. VSS. Will continue to monitor.

## 2018-12-06 NOTE — Progress Notes (Signed)
RT Note:  Was called by RN to patient's room. RN stated that patient was extremely SOB and that she had placed patient on a NRB; RN stated that patient sats were up and down but had dipped into the 70s.  Upon arriving to room, patient was breathing better and sats were in upper 90s to 100%.  Patient stated that he woke up and had to urinate, thought he was urinating in the urinal, but he wasn't.  Patient got upset and could not breathe good.  Patient has calmed down now and asked to be placed back on [place].  Currently have patient on 5L [place] and sats are in mid 90s.  Patient stated that he felt less SOB and more alert at this time.  Will continue to monitor and will pass along information in report to next shift.

## 2018-12-07 LAB — CBC
HCT: 26.1 % — ABNORMAL LOW (ref 39.0–52.0)
Hemoglobin: 7.9 g/dL — ABNORMAL LOW (ref 13.0–17.0)
MCH: 28.8 pg (ref 26.0–34.0)
MCHC: 30.3 g/dL (ref 30.0–36.0)
MCV: 95.3 fL (ref 80.0–100.0)
NRBC: 0 % (ref 0.0–0.2)
Platelets: 143 10*3/uL — ABNORMAL LOW (ref 150–400)
RBC: 2.74 MIL/uL — ABNORMAL LOW (ref 4.22–5.81)
RDW: 15 % (ref 11.5–15.5)
WBC: 7.1 10*3/uL (ref 4.0–10.5)

## 2018-12-07 LAB — COOXEMETRY PANEL
Carboxyhemoglobin: 2 % — ABNORMAL HIGH (ref 0.5–1.5)
Methemoglobin: 1.2 % (ref 0.0–1.5)
O2 Saturation: 77.5 %
Total hemoglobin: 8 g/dL — ABNORMAL LOW (ref 12.0–16.0)

## 2018-12-07 LAB — BASIC METABOLIC PANEL
Anion gap: 9 (ref 5–15)
BUN: 66 mg/dL — ABNORMAL HIGH (ref 8–23)
CALCIUM: 8.7 mg/dL — AB (ref 8.9–10.3)
CO2: 33 mmol/L — AB (ref 22–32)
Chloride: 91 mmol/L — ABNORMAL LOW (ref 98–111)
Creatinine, Ser: 2.34 mg/dL — ABNORMAL HIGH (ref 0.61–1.24)
GFR calc non Af Amer: 29 mL/min — ABNORMAL LOW (ref >60)
GFR, EST AFRICAN AMERICAN: 33 mL/min — AB (ref >60)
Glucose, Bld: 161 mg/dL — ABNORMAL HIGH (ref 70–99)
Potassium: 4.5 mmol/L (ref 3.5–5.1)
Sodium: 133 mmol/L — ABNORMAL LOW (ref 135–145)

## 2018-12-07 LAB — GLUCOSE, CAPILLARY
GLUCOSE-CAPILLARY: 340 mg/dL — AB (ref 70–99)
Glucose-Capillary: 299 mg/dL — ABNORMAL HIGH (ref 70–99)
Glucose-Capillary: 130 mg/dL — ABNORMAL HIGH (ref 70–99)
Glucose-Capillary: 180 mg/dL — ABNORMAL HIGH (ref 70–99)
Glucose-Capillary: 348 mg/dL — ABNORMAL HIGH (ref 70–99)
Glucose-Capillary: 194 mg/dL — ABNORMAL HIGH (ref 70–99)

## 2018-12-07 LAB — PROTIME-INR
INR: 1.1 (ref 0.8–1.2)
Prothrombin Time: 14 seconds (ref 11.4–15.2)

## 2018-12-07 LAB — EXPECTORATED SPUTUM ASSESSMENT W GRAM STAIN, RFLX TO RESP C

## 2018-12-07 LAB — PREPARE RBC (CROSSMATCH)

## 2018-12-07 LAB — HEMOGLOBIN AND HEMATOCRIT, BLOOD
HCT: 23.5 % — ABNORMAL LOW (ref 39.0–52.0)
HEMOGLOBIN: 7.2 g/dL — AB (ref 13.0–17.0)

## 2018-12-07 LAB — FACTOR 5 LEIDEN

## 2018-12-07 MED ORDER — LORAZEPAM 2 MG/ML IJ SOLN: 1.0000 mg | INTRAMUSCULAR | Status: DC | PRN

## 2018-12-07 MED ORDER — GLYCOPYRROLATE 0.2 MG/ML IJ SOLN
0.2000 mg | Freq: Four times a day (QID) | INTRAMUSCULAR | Status: DC
  Administered 2018-12-07 (×2): 0.2 mg via INTRAVENOUS
  Filled 2018-12-07 (×2): qty 1

## 2018-12-07 MED ORDER — LORAZEPAM 2 MG/ML IJ SOLN
0.5000 mg | INTRAMUSCULAR | Status: DC | PRN
  Administered 2018-12-07: 0.5 mg via INTRAVENOUS
  Filled 2018-12-07: qty 1

## 2018-12-07 MED ORDER — SODIUM CHLORIDE 0.9% IV SOLUTION: Freq: Once | INTRAVENOUS | Status: DC

## 2018-12-07 MED ORDER — MORPHINE SULFATE (CONCENTRATE) 10 MG/0.5ML PO SOLN: 5.0000 mg | ORAL | Status: DC | PRN

## 2018-12-07 MED ORDER — MILRINONE LACTATE IN DEXTROSE 20-5 MG/100ML-% IV SOLN: 0.1250 ug/kg/min | INTRAVENOUS | Status: DC

## 2018-12-07 MED ORDER — ALPRAZOLAM 0.5 MG PO TABS
0.5000 mg | ORAL_TABLET | ORAL | Status: AC
  Administered 2018-12-07: 0.5 mg via ORAL
  Filled 2018-12-07: qty 1

## 2018-12-07 MED ORDER — HYDRALAZINE HCL 50 MG PO TABS
25.0000 mg | ORAL_TABLET | Freq: Three times a day (TID) | ORAL | Status: DC
  Administered 2018-12-07 – 2018-12-08 (×5): 25 mg via ORAL
  Filled 2018-12-07 (×5): qty 1

## 2018-12-07 MED ORDER — LORAZEPAM 0.5 MG PO TABS: 0.5000 mg | ORAL_TABLET | ORAL | Status: DC | PRN

## 2018-12-07 NOTE — Progress Notes (Signed)
Joey with BS[facility] turned ICD off per Vinie Sill, NP.

## 2018-12-07 NOTE — Care Management Note (Signed)
Case Management Note Previous Note Created by [facility]  Patient Details  Name: [name] MRN: [id number] Date of Birth: March 14, 1956  Subjective/Objective:     Admitted with CHF. Pt from home, ind with ADL's. Pt has insurance and PCP. Admission last month for same. Refused Home health at that time. Discussed home health RN with patient for disease management, he is agreeable. Discussed [place] program [facility], however patient will not be eligible as he has a pacemaker. Will still order [facility] RN at time of DC.  Patient reports he has a home oxygen concentrator at home, unsure of agency, reports he does not utilize it often.           Has PCP. Will have cardiology follow up. Has transportation.  Action/Plan: DC home with home health RN. Will refer to [facility].  Expected Discharge Date:   unk               Expected Discharge Plan:  [place]  In-House Referral:     Discharge planning Services  CM Consult  Post Acute Care Choice:  Home Health Choice offered to:  Patient  DME Arranged:    DME Agency:     HH Arranged:  RN, Disease Management [place] Agency:  [facility] (now [facility])  Status of Service:  Completed, signed off  If discussed at [place] of Stay Meetings, dates discussed:    Additional Comments: 12/07/2018  Pt started on lasix drip yesterday with inadequate output, pt has ongoing hemoptysis and therefore heparin d/c.  Pt also on milrinone and IV antibiotics.  Palliative care following - pt may chose Home with Hospice once he is medically stable for discharge.  CM will continue to follow  12/06/18 Pt remains short of breath, CVP in low teens, remains on milrinone, requiring both IV steroids and lasix.  CM will continue to follow for discharge needs   12/01/18 Pt transferred to [facility].  Pt remains on milrinone drip and IV heparin.  Plans if for Cath on 3/2 and potential LVAD workup. Maryclare Labrador,  RN 12/07/2018, 9:37 AM

## 2018-12-07 NOTE — Progress Notes (Signed)
PT Cancellation Note  Patient Details Name: AQUILLA VOILES MRN: [id number] DOB: 1956-09-23   Cancelled Treatment:    Reason Eval/Treat Not Completed: Medical issues which prohibited therapy. Per discussion with RN, pt with increased work of breathing and not appropriate for therapy currently.  Ellamae Sia, PT, DPT Acute Rehabilitation Services Pager [phone] Office [phone]    Willy Eddy 12/07/2018, 8:41 AM

## 2018-12-07 NOTE — Progress Notes (Addendum)
NAME:  [name], MRN:  [id number], DOB:  Mar 23, 1956, LOS: 65 ADMISSION DATE:  11/26/2018, CONSULTATION DATE:  3/2 REFERRING MD:  Dr. Aundra Dubin, CHIEF COMPLAINT:  Shortness of Breath    Brief History   63 y/o M admitted 2/23 to [facility] with worsening SOB, LE swelling.  Transferred to [facility] 2/26 for evaluation by CHF service.  Pt unable to lie flat for LHC 3/2, PCCM consulted for dyspnea.   History of present illness   63 y/o M who presented 2/23 to [facility] ER via EMS with reports of gradual worsening of shortness of breath.  The patient reported on admit he had noted swelling increasing from his feet to his abdomen.    He was initially admitted per [facility] to [facility].  Initial work up notable for AKI with serum creatinine of 1.66 / BUN 31, troponin 0.06, BNP 1629.  CXR demonstrated cardiomegaly, interstitial edema and right pleural effusion.  PICC line placed and diuresis attempted. Initial Co-ox 61% but dropped to 49% 2/26 with low output CHF and was transferred to [facility] 2/26 for further evaluation.   On arrival to [facility] SDU, he was noted to have increased work of breathing and a new O2 need that required NRB.  I/O's were in question.  He was placed on milrinone, heparin gtt.  He was intermittently diuresed.  He was taken to the cath lab for assessment but was unable to lie flat for procedure.  PFT's were evaluated 2/28 with FEV1 1.33 (35%), FVC 1.75 (34%), Ratio 76%, DLCO 33%. Further, the patient was evaluated by CVTS for VAD placement and felt his pulmonary status would preclude sternotomy & VAD implantation.  ECHO 2/29 with LVEF 20-25%, moderate MR, normal RV size / systolic function, PASP 52 mmHg.  CT chest evaluation on 2/29 notable for mild emphysema, atelectasis, R>L pleural effusions.   On 3/2, the patient remained on 4L/[place] and PCCM consulted for evaluation of dyspnea. He currently reports no fevers, sputum production (does not normally produce sputum).  Indicates progressively worsening swelling / SOB brought  him into the hospital.  States he quit smoking 2 weeks ago.  Began smoking age 32, smoked up to 2.5 to 3ppd.  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    Past Medical History  Mixed Ischemic / non-ischemic cardiomyopathy Chronic AF on Coumadin  Subtotal colectomy for large tubular adenoma at ileocecal valve  Chronic diarrhea because of colon resection Barrett's Esophagitis at [place] junction Former Heavy ETOH, quit 2006  [facility]   2/23  Admit to [facility] with SOB, swelling up to abdomen  2/26  Transferred to [facility] for CHF evaluation  3/02  PCCM consulted for SOB.  Pt unable to lie flat for LHC  Consults:  PCCM 3/2   Procedures:    Significant Diagnostic Tests:  CT Chest 2/29 >> mild cardiomegaly, bilateral effusions with interstitial thickening in the lungs & patchy hazy airspace opacities consistent with pulmonary edema, bilateral lower lobe, RML, LUL lingula atelectasis  CT Abd/Pelvis 2/29 >> no acute findings, trace ascites  Micro Data:    Antimicrobials:     Interim history/subjective:  Hemoptysis improved overnight - amount is less but still "getting up that red stuff" when he coughs. Denies SOB at rest. Off heparin.  Remains on lasix gtt.  On 8L HFNC. Easily desats with exertion, needing 15L with.   Objective   Blood pressure (!) 145/73, pulse 93, temperature (!) 97.5 F (36.4 C), temperature source Oral, resp. rate  19, height 6' (1.829 m), weight 83.3 kg, SpO2 100 %. CVP:  [10 mmHg-19 mmHg] 10 mmHg      Intake/Output Summary (Last 24 hours) at 12/07/2018 6203 Last data filed at 12/07/2018 5597 Gross per 24 hour  Intake 3155.4 ml  Output 2600 ml  Net 555.4 ml   Filed Weights   12/04/18 0639 12/05/18 0500 12/06/18 0328  Weight: 81.3 kg 82.1 kg 83.3 kg    Examination: General: Chronically ill appearing male, disheveled, NAD in bed HEENT: /AT, PERRL, EOM-I and MMM Neuro: awake, alert, appropriate, MAE  CV: RRR, Nl S1/S2 and -M/R/G PULM:  resps even non labored on 8L HFNC, scattered crackles, small amount dark red bloody sputum observed in cup at bedside  GI: Soft, NT, ND and +BS Extremities: warm/dry, pitting BLE edema, LE's wrapped   Skin: no rashes or lesions   Resolved Hospital Problem list     Assessment & Plan:   Acute Hypoxemic Respiratory Failure - multifactorial in setting COPD, acute on chronic sCHF, ischemic cardiomyopathy, ?HCAP now with ongoing hemoptysis.  COPD P: Titrate O2 for sat of 88-92% Flutter valve / incentive spirometry for atelectasis  Mobilize / OOB Continue to hold Dulera, likely not strong enough to adequately inhale  Brovana + Pulmicort BID  Xopenex / atrovent PRN Reduce solumedrol to 40 mg IV QD, would D/C after 5 day course 3/8 Active diureses as renal function allows PRN oral morphine   Bilateral Pleural Effusion  -R>L, small to moderate P: F/U CXR Lasix gtt per cards/heart failure team   No thora  Acute on Chronic sCHF / Ischemic Cardiomyopathy  P: Per CHF Service  Follow co-ox Milrinone drip Lasix drip   Hemoptysis: DAH vs airway trauma 3/4>> CRP 1.1  3/4>>ESR 37 P: Ideally would like to get a CT of the chest but patient can not lay flat Hold off on high dose steroids for now  Would like to bronch but will likely end up intubated (now DNR) - discussed at length with pt, he agrees and does not want to pursue FOB  Labs   CBC: Recent Labs  Lab 12/02/18 0515 12/03/18 0511 12/04/18 0557 12/05/18 0420 12/06/18 0347 12/07/18 0028 12/07/18 0451  WBC 4.6 3.6* 5.4 6.0 5.6  --  7.1  NEUTROABS 3.3 2.4 4.0 4.6 4.0  --   --   HGB 8.2* 7.6* 7.9* 7.5* 7.2* 7.2* 7.9*  HCT 26.9* 25.5* 25.6* 25.1* 23.9* 23.5* 26.1*  MCV 96.8 96.6 97.0 97.3 96.0  --  95.3  PLT 177 146* 169 158 146*  --  143*    Basic Metabolic Panel: Recent Labs  Lab 12/03/18 0511 12/04/18 0557 12/05/18 0420 12/06/18 0347 12/07/18 0451  NA 134* 134* 135 136 133*  K 3.9 4.4 4.4 4.5 4.5  CL 92* 93*  93* 93* 91*  CO2 34* 34* 35* 35* 33*  GLUCOSE 264* 201* 173* 183* 161*  BUN 38* 40* 49* 55* 66*  CREATININE 1.56* 1.65* 1.86* 1.86* 2.34*  CALCIUM 8.1* 8.5* 8.8* 8.8* 8.7*   GFR: Estimated Creatinine Clearance: 35.9 mL/min (A) (by C-G formula based on SCr of 2.34 mg/dL (H)). Recent Labs  Lab 12/04/18 0557 12/05/18 0420 12/06/18 0347 12/07/18 0451  WBC 5.4 6.0 5.6 7.1    Liver Function Tests: No results for input(s): AST, ALT, ALKPHOS, BILITOT, PROT, ALBUMIN in the last 168 hours. No results for input(s): LIPASE, AMYLASE in the last 168 hours. No results for input(s): AMMONIA in the last 168 hours.  ABG  Component Value Date/Time   PHART 7.398 12/01/2018 1710   PCO2ART 58.2 (H) 12/01/2018 1710   PO2ART 72.5 (L) 12/01/2018 1710   HCO3 35.2 (H) 12/01/2018 1710   TCO2 28 01/26/2014 0650   ACIDBASEDEF 17.2 (H) 09/04/2018 1515   O2SAT 77.5 12/07/2018 0440     Coagulation Profile: Recent Labs  Lab 12/03/18 0511 12/04/18 0557 12/05/18 0420 12/06/18 0347 12/07/18 0451  INR 1.2 1.2 1.1 1.1 1.1    Cardiac Enzymes: No results for input(s): CKTOTAL, CKMB, CKMBINDEX, TROPONINI in the last 168 hours.  HbA1C: Hgb A1c MFr Bld  Date/Time Value Ref Range Status  12/01/2018 04:06 PM 7.7 (H) 4.8 - 5.6 % Final    Comment:    (NOTE) Pre diabetes:          5.7%-6.4% Diabetes:              >6.4% Glycemic control for   <7.0% adults with diabetes   09/04/2018 03:24 PM 7.3 (H) 4.8 - 5.6 % Final    Comment:    (NOTE) Pre diabetes:          5.7%-6.4% Diabetes:              >6.4% Glycemic control for   <7.0% adults with diabetes     CBG: Recent Labs  Lab 12/06/18 0644 12/06/18 1111 12/06/18 1705 12/06/18 2106 12/07/18 0615  GLUCAP 214* 176* 299* 299* [place], NP 12/07/2018  8:22 AM Pager: [phone] or [phone]  Attending Note:  63 year old male with extensive cardiac and pulmonary history who presents to PCCM in heart and pulmonary  failure.  Patient had hemoptysis yesterday that seems to be improving today with bibasilar crackles on exam.  I reviewed CXR myself, infiltrate R>L noted.  Discussed with palliative care and with cardiology.  The ESR and CRP are not terribly remarkable to make [place] high on the list.  This most likely a combination of anticoagulation and continuous coughing.  Patient is complaining that he is very anxious and cant breath.  There is unfortunately very little that can be done for the patient at this point.  Recommend in addition to above more of a palliative approach at this point and more likely than not inpatient hospice.  PCCM will be available PRN at this point.  The patient is critically ill with multiple organ systems failure and requires high complexity decision making for assessment and support, frequent evaluation and titration of therapies, application of advanced monitoring technologies and extensive interpretation of multiple databases.   Critical Care Time devoted to patient care services described in this note is  32  Minutes. This time reflects time of care of this signee Dr Jennet Maduro. This critical care time does not reflect procedure time, or teaching time or supervisory time of PA/NP/Med student/Med Resident etc but could involve care discussion time.  Rush Farmer, M.D. [facility] Pulmonary/Critical Care Medicine. Pager: [phone]. After hours pager: [phone].

## 2018-12-07 NOTE — Progress Notes (Addendum)
Patient ID: [name], male   DOB: 03/25/1956, 63 y.o.   MRN: [id number]     Advanced Heart Failure Rounding Note  PCP-Cardiologist: Carlyle Dolly, MD   Subjective:     Yesterday he received 1UPRBCs. Hgb trend 7.1>7.9. Having ongoing hemoptysis. He was put back on lasix drip and had a dose of metolazone. Heprin drip stopped with hemoptysis.   Remains SOB at rest. Continues to cough up blood.   Objective:   Weight Range: 83.3 kg Body mass index is 24.91 kg/m.   Vital Signs:   Temp:  [97.5 F (36.4 C)-98.6 F (37 C)] 97.5 F (36.4 C) (03/05 0340) Pulse Rate:  [90-103] 93 (03/05 0340) Resp:  [19-30] 19 (03/05 0340) BP: (131-145)/(51-73) 145/73 (03/05 0340) SpO2:  [62 %-100 %] 100 % (03/05 0340) Last BM Date: 12/06/18  Weight change: Filed Weights   12/04/18 0639 12/05/18 0500 12/06/18 0328  Weight: 81.3 kg 82.1 kg 83.3 kg    Intake/Output:   Intake/Output Summary (Last 24 hours) at 12/07/2018 0843 Last data filed at 12/07/2018 0658 Gross per 24 hour  Intake 3155.4 ml  Output 2600 ml  Net 555.4 ml     Physical Exam   CVP 10-11.  General:  Dyspneic talking.  No resp difficulty HEENT: normal Neck: supple. JVP 10-11. Carotids 2+ bilat; no bruits. No lymphadenopathy or thryomegaly appreciated. Cor: PMI nondisplaced. Regular rate & rhythm. No rubs, gallops or murmurs. Lungs: Rhonchi on 8 liters HFNC Abdomen: soft, nontender, nondistended. No hepatosplenomegaly. No bruits or masses. Good bowel sounds. Extremities: no cyanosis, clubbing, rash, R and LLE 1+ edema Neuro: alert & orientedx3, cranial nerves grossly intact. moves all 4 extremities w/o difficulty. Affect pleasant    Telemetry   NSR BiV 100s.   EKG    No new tracings.    Labs    CBC Recent Labs    12/05/18 0420 12/06/18 0347 12/07/18 0028 12/07/18 0451  WBC 6.0 5.6  --  7.1  NEUTROABS 4.6 4.0  --   --   HGB 7.5* 7.2* 7.2* 7.9*  HCT 25.1* 23.9* 23.5* 26.1*  MCV 97.3 96.0  --  95.3  PLT  158 146*  --  242*   Basic Metabolic Panel Recent Labs    12/06/18 0347 12/07/18 0451  NA 136 133*  K 4.5 4.5  CL 93* 91*  CO2 35* 33*  GLUCOSE 183* 161*  BUN 55* 66*  CREATININE 1.86* 2.34*  CALCIUM 8.8* 8.7*   Liver Function Tests No results for input(s): AST, ALT, ALKPHOS, BILITOT, PROT, ALBUMIN in the last 72 hours. No results for input(s): LIPASE, AMYLASE in the last 72 hours. Cardiac Enzymes No results for input(s): CKTOTAL, CKMB, CKMBINDEX, TROPONINI in the last 72 hours.  BNP: BNP (last 3 results) Recent Labs    09/04/18 1517 10/16/18 1536 11/26/18 1645  BNP 605.0* 2,147.0* 1,629.0*   ProBNP (last 3 results) No results for input(s): PROBNP in the last 8760 hours.  D-Dimer No results for input(s): DDIMER in the last 72 hours. Hemoglobin A1C No results for input(s): HGBA1C in the last 72 hours. Fasting Lipid Panel No results for input(s): CHOL, HDL, LDLCALC, TRIG, CHOLHDL, LDLDIRECT in the last 72 hours. Thyroid Function Tests No results for input(s): TSH, T4TOTAL, T3FREE, THYROIDAB in the last 72 hours.  Invalid input(s): FREET3  Other results:  Imaging   Dg Chest Port 1 View  Result Date: 12/06/2018 CLINICAL DATA:  Shortness of breath, cough, hemoptysis EXAM: PORTABLE CHEST 1 VIEW COMPARISON:  12/04/2018 FINDINGS: Diffuse bilateral airspace disease, worsening on the right since prior study, stable on the left. There is cardiomegaly. Moderate layering bilateral effusions. Left AICD remains in place, unchanged. Right PICC line is stable. IMPRESSION: Diffuse bilateral airspace disease, increasing on the right since prior study. Findings could reflect edema or infection. Moderate layering bilateral effusions. Electronically Signed   By: Rolm Baptise M.D.   On: 12/06/2018 10:39    Medications:    Scheduled Medications: . sodium chloride   Intravenous Once  . arformoterol  15 mcg Nebulization BID  . budesonide (PULMICORT) nebulizer solution  0.5 mg  Nebulization BID  . feeding supplement (ENSURE ENLIVE)  237 mL Oral BID BM  . ferrous sulfate  325 mg Oral BID  . hydrALAZINE  50 mg Oral Q8H  . insulin aspart  0-20 Units Subcutaneous TID WC  . insulin aspart  0-5 Units Subcutaneous QHS  . insulin aspart  6 Units Subcutaneous TID WC  . insulin glargine  24 Units Subcutaneous Daily  . ipratropium  0.5 mg Nebulization TID  . isosorbide mononitrate  60 mg Oral Daily  . levalbuterol  1.25 mg Nebulization TID  . magnesium oxide  400 mg Oral BID  . mouth rinse  15 mL Mouth Rinse BID  . methylPREDNISolone (SOLU-MEDROL) injection  40 mg Intravenous Daily  . morphine CONCENTRATE  5 mg Sublingual QHS  . pantoprazole  40 mg Oral QAC breakfast  . primidone  50 mg Oral Daily  . sertraline  50 mg Oral Daily  . simvastatin  40 mg Oral QHS  . sodium chloride flush  10-40 mL Intracatheter Q12H  . spironolactone  25 mg Oral Daily    Infusions: . sodium chloride 10 mL/hr at 12/05/18 1058  . ceFEPime (MAXIPIME) IV 2 g (12/06/18 1321)  . furosemide (LASIX) infusion 15 mg/hr (12/07/18 0658)  . milrinone 0.25 mcg/kg/min (12/07/18 0658)  . vancomycin      PRN Medications: acetaminophen **OR** acetaminophen, albuterol, morphine CONCENTRATE, nitroGLYCERIN  Patient Profile   [name] is a 63 y.o. male  with h/o Paroxysmal Afib, CAD, OA of both hands, chronic combined systolic/diastolic CHF, CHB s/p [employer] BiV ICD, Carpal Tunnel syndrome, h/o Colon CA, COPD, DM2, peripheral neuropathy, ETOH abuse, GERD, HLD, HTN, and h/o tobacco abuse.   Admitted 11/26/2018 with worsening DOE and edema despite adjustment of outpatient diuretics. Moved to [facility] 11/29/18 with low output by PICC line.   Assessment/Plan   1. Acute on chronic systolic CHF: Ischemic cardiomyopathy. [employer] CRT-D device.   Echo in 12/19 with EF 20-25%, moderately dilated RV with mildly decreased systolic function, mild AS, moderate MR, moderate TR, PASP 50 mmHg.  Echo this  admission with EF 20-25%, normal RV, moderate MR, PASP 52 mmHg.  PICC in place, co-ox suggested low output at 49%.  We have discussed LVAD with him and he was seen by Dr. Prescott Gum.  Not thought to be a candidate at this point due to severe COPD on PFTs (though may be possible in future if PFTs improve off cigarettes for longer term and with diuresis => CT chest did not show impressive emphysema), however patient also does not think he would want LVAD.   - Continue milrinone 0.25 for now. CO-OX 78%.  - CVP down to 10-11. Keep Lasix gtt going but no metolazone today with rise in creatinine.  - BP stable, continue hydralazine at 50 mg TID and Imdur 60 mg daily. - No losartan/Entresto for now until  renal function stabilizes eventually.  - Stop spiro with elevated creatinine.   - With rising creatinine and still unable to lie back due to orthopnea, no cath yet.  Will need RHC/LHC in future.  2. CAD: Anterior MI in 2006 with PCI to LAD.  DES for in-stent restenosis in 2007.  No cath/intervention since that time. No chest pain.   - Given warfarin anticoagulation, he is not on ASA. Was on heparin gtt pending cath but stopped 3/4 with hemoptysis.   - Continue Zocor.  - Ideally needs coronary angiography but wait until creatinine stabilizes and he can lie flat.  3. AKI on CKD stage 3:  Cr 1.7 -> 1.84 -> 1.65 -> 1.7 -> 1.56 -> 1.65 -> 1.86 -> 1.8->2.34 . Suspect cardiorenal syndrome.  CVp 10-11  4. COPD: He recently quit smoking.  Apparently, home oxygen was recommended 7 years ago. Severe COPD by PFTs  2/28. FEV1 1.33 (35%) DLCO 33%. However, CT chest last week was more consistent with mild residual pulmonary edema and showed only mild emphysema.  Suspect COPD exacerbation may be playing a significant role in current symptoms. He is afebrile, normal WBCs but now with ongoing hemoptysis.  - Continue Solumedrol 40 mg IV daily and nebs. Sed Rate 37.  - Off heparin gtt with hemoptysis.  - On  HCAP coverage with  vancomycin/cefepime.  - He does not want to pursue bronchoscopy given likelihood of ongoing mechanical ventilation.      5. Type II diabetes: Diabetes coordinator following, increased insulin with steroids.    6. Atrial fibrillation: Paroxysmal. He has been in NSR.  He was on heparin gtt and off warfarin but with hemoptysis and falling hgb, this was stopped.   7. Severe protein-calorie malnutrition - encouraged po intake - Continue Ensure 8. Anemia:  No overt GI bleeding.  Not Fe deficient. He got 1 unit PRBCs on 3/3.  He has more hemoptysis this morning.  - Received 1UPRBCs on 3/4. Hgb trend 7.1>7.9. Give 1 more unit of PRBCs with ongoing hemoptysis.   - Send FOBT.  - Off heparin with hemoptysis.  9. DNR  Palliative Care Following. He wants to go home. Anticipate referral to Hospice.   Length of Stay: [place], NP  12/07/2018, 8:43 AM  Advanced Heart Failure Team Pager [phone] (M-F; 7a - 4p)  Please contact [facility] for night-coverage after hours (4p -7a ) and weekends on [email or web address]  Patient seen with NP, agree with the above note.   I/Os net positive yesterday but creatinine up to 2.34. Co-ox good at 77.5%.  CVP remains 11 range.  Hgb up to 7.9.  Still having hemoptysis and FOBT+.   On exam, JVP 12+ cm, distant BS with rhonchi, regular S1S2, 1+ edema to thighs.   Difficult situation.  Ongoing hemoptysis, off heparin gtt.  Ideally would have CT and bronchoscopy, but unable to lie flat with CT and would have to intubate to bronch (he would want intubation).   - Continue nebs and vancomycin/cefepime coverage.  - Will transfuse 1 unit PRBCs with hgb 7.9.  - Continue steroids.   Creatinine up to 2.34 and did not diurese well.  CVP not markedly elevated at 11 but has prominent JVD.  He remains markedly short of breath on high flow oxygen.   - Continue Lasix gtt at 15 mg/hr but no metolazone today with creatinine rise.  - Continue milrinone, good co-ox today.  - BP lower,  cut hydralazine back to 25 mg  tid.   I am concerned that we are not going to be able to turn around this situation.  Palliative care following.  We talked briefly about hospice/comfort care this morning.  He wants to be able to go home, but currently he would not be able to manage at home (continues to need high flow oxygen).   CRITICAL CARE Performed by: Loralie Champagne  Total critical care time: 35 minutes  Critical care time was exclusive of separately billable procedures and treating other patients.  Critical care was necessary to treat or prevent imminent or life-threatening deterioration.  Critical care was time spent personally by me on the following activities: development of treatment plan with patient and/or surrogate as well as nursing, discussions with consultants, evaluation of patient's response to treatment, examination of patient, obtaining history from patient or surrogate, ordering and performing treatments and interventions, ordering and review of laboratory studies, ordering and review of radiographic studies, pulse oximetry and re-evaluation of patient's condition.  Loralie Champagne 12/07/2018 9:31 AM

## 2018-12-07 NOTE — Care Management Important Message (Signed)
Important Message  Patient Details  Name: [name] MRN: [id number] Date of Birth: 1955-10-15   Medicare Important Message Given:  Yes    Nalla Purdy P Jefferson 12/07/2018, 1:18 PM

## 2018-12-07 NOTE — Progress Notes (Addendum)
Palliative:  I have met today with [name] and again with [name] along with his family (wife, son, grandchildren). They are all appropriately tearful. We discussed poor prognosis and lack of options available that are expected to improve his condition. We discussed his worsening renal failure and lack of improvement in his breathing or diuresis despite milrinone and Lasix infusion. I explained that we believe prognosis to be poor. [name] expresses that he would like to return home if his time is limited. We discussed in full options: remain in hospital with current care, transition to comfort care here, or begin transition off infusions with a goal to return home with hospice. I did explain to [name] that he may only live a couple days. I also explained that he could decline while in the hospital to the extent of not being able to leave the hospital. I explained the role of hospice at home but also that hospice would not be in the home 24/7 but would be a support to them during this time. I stressed the point that family would be responsible to provide the majority of his care at home under the guidance of hospice. [name] very much is interested in returning home if at all possible but will speak further with his family. We also discussed the possibility of deactivation of AICD.   I returned this afternoon to [facility] and he is with his brother. He tells me that he has decided he would like to begin titrating off IV infusions to prepare to return home ASAP. He has also decided to deactivate AICD (I have called [employer] and requested AICD to be deactivated). I have also discussed [name]'s decision and plans with Darrick Grinder, NP Heart Failure.   All questions/cocnerns addressed. Emotional support provided.   Exam: Alert, oriented. Anxious. SOB. BLE edema.   Plan: - D/C IV infusions (Lasix and milrinone per heart failure team) - Deactivate AICD - Home with hospice (if appropriate; he understands that with  significant decline he may not be able to return home).  - Will continue medications for now. Transition to po steroids and antibiotics if tolerating off infusions. Realize that these are of limited benefit but patient would like to believe that something is being done. Okay to continue as long as he can take po.  - PRN morphine and ativan available. Will schedule doses prior to return home and liberalize PRN dosages.  - I will reach out to hospice to discuss case and plan tomorrow if patient remains stable enough to transport home.  - DO NOT REMOVE PICC LINE AT D/C. Hospice can utilize PICC at home for opioid infusion as needed to manage symptoms.   [id number], [id number], [id number] Total Time: 80 min  Vinie Sill, NP Palliative Medicine Team Pager # [phone] (M-F 8a-5p) Team Phone # [phone] (Nights/Weekends)

## 2018-12-08 LAB — BPAM RBC
Blood Product Expiration Date: 202003312359
Blood Product Expiration Date: 202003312359
Blood Product Expiration Date: 202004012359
ISSUE DATE / TIME: 202003031256
ISSUE DATE / TIME: 202003041836
ISSUE DATE / TIME: 202003051011
Unit Type and Rh: 5100
Unit Type and Rh: 5100
Unit Type and Rh: 5100

## 2018-12-08 LAB — BASIC METABOLIC PANEL
Anion gap: 7 (ref 5–15)
BUN: 71 mg/dL — ABNORMAL HIGH (ref 8–23)
CO2: 35 mmol/L — ABNORMAL HIGH (ref 22–32)
Calcium: 8.5 mg/dL — ABNORMAL LOW (ref 8.9–10.3)
Chloride: 93 mmol/L — ABNORMAL LOW (ref 98–111)
Creatinine, Ser: 1.84 mg/dL — ABNORMAL HIGH (ref 0.61–1.24)
GFR calc Af Amer: 45 mL/min — ABNORMAL LOW (ref >60)
GFR calc non Af Amer: 38 mL/min — ABNORMAL LOW (ref >60)
GLUCOSE: 146 mg/dL — AB (ref 70–99)
Potassium: 4.2 mmol/L (ref 3.5–5.1)
Sodium: 135 mmol/L (ref 135–145)

## 2018-12-08 LAB — CULTURE, RESPIRATORY W GRAM STAIN: Culture: NORMAL

## 2018-12-08 LAB — TYPE AND SCREEN
ABO/RH(D): O POS
Antibody Screen: NEGATIVE
Unit division: 0
Unit division: 0
Unit division: 0

## 2018-12-08 LAB — CBC
HCT: 25.9 % — ABNORMAL LOW (ref 39.0–52.0)
Hemoglobin: 8 g/dL — ABNORMAL LOW (ref 13.0–17.0)
MCH: 29.3 pg (ref 26.0–34.0)
MCHC: 30.9 g/dL (ref 30.0–36.0)
MCV: 94.9 fL (ref 80.0–100.0)
PLATELETS: 138 10*3/uL — AB (ref 150–400)
RBC: 2.73 MIL/uL — ABNORMAL LOW (ref 4.22–5.81)
RDW: 14.5 % (ref 11.5–15.5)
WBC: 5.5 10*3/uL (ref 4.0–10.5)
nRBC: 0 % (ref 0.0–0.2)

## 2018-12-08 LAB — GLUCOSE, CAPILLARY
GLUCOSE-CAPILLARY: 177 mg/dL — AB (ref 70–99)
Glucose-Capillary: 140 mg/dL — ABNORMAL HIGH (ref 70–99)
Glucose-Capillary: 154 mg/dL — ABNORMAL HIGH (ref 70–99)
Glucose-Capillary: 347 mg/dL — ABNORMAL HIGH (ref 70–99)

## 2018-12-08 MED ORDER — GLYCOPYRROLATE 1 MG PO TABS
1.0000 mg | ORAL_TABLET | Freq: Three times a day (TID) | ORAL | Status: DC
  Administered 2018-12-08: 1 mg via ORAL
  Filled 2018-12-08 (×3): qty 1

## 2018-12-08 MED ORDER — LORAZEPAM 0.5 MG PO TABS: 0.5000 mg | ORAL_TABLET | ORAL | 0 refills | Status: DC | PRN

## 2018-12-08 MED ORDER — LORAZEPAM 0.5 MG PO TABS: 0.5000 mg | ORAL_TABLET | ORAL | 0 refills | Status: AC | PRN

## 2018-12-08 MED ORDER — PREDNISONE 20 MG PO TABS: 20.0000 mg | ORAL_TABLET | Freq: Every day | ORAL | Status: DC

## 2018-12-08 MED ORDER — MORPHINE SULFATE (CONCENTRATE) 10 MG/0.5ML PO SOLN: 5.0000 mg | Freq: Every day | ORAL | 0 refills | Status: DC

## 2018-12-08 MED ORDER — PREDNISONE 20 MG PO TABS: 40.0000 mg | ORAL_TABLET | Freq: Every day | ORAL | Status: DC

## 2018-12-08 MED ORDER — LORAZEPAM 2 MG/ML PO CONC: 0.5000 mg | ORAL | 0 refills | Status: DC | PRN

## 2018-12-08 MED ORDER — LORAZEPAM 2 MG/ML PO CONC: 0.5000 mg | ORAL | Status: DC | PRN

## 2018-12-08 MED ORDER — TORSEMIDE 20 MG PO TABS
100.0000 mg | ORAL_TABLET | Freq: Two times a day (BID) | ORAL | Status: DC
  Administered 2018-12-08 (×2): 100 mg via ORAL
  Filled 2018-12-08 (×2): qty 5

## 2018-12-08 MED ORDER — TORSEMIDE 100 MG PO TABS: 100.0000 mg | ORAL_TABLET | Freq: Two times a day (BID) | ORAL | 6 refills | Status: DC

## 2018-12-08 MED ORDER — MORPHINE SULFATE (CONCENTRATE) 10 MG/0.5ML PO SOLN: 5.0000 mg | Freq: Every day | ORAL | 0 refills | Status: AC

## 2018-12-08 MED ORDER — TORSEMIDE 100 MG PO TABS: 100.0000 mg | ORAL_TABLET | Freq: Two times a day (BID) | ORAL | 6 refills | Status: AC

## 2018-12-08 MED ORDER — GLYCOPYRROLATE 1 MG PO TABS: 1.0000 mg | ORAL_TABLET | Freq: Three times a day (TID) | ORAL | 0 refills | Status: AC

## 2018-12-08 MED ORDER — LORAZEPAM 0.5 MG PO TABS
0.5000 mg | ORAL_TABLET | ORAL | Status: DC | PRN
  Administered 2018-12-08 (×2): 0.5 mg via ORAL
  Filled 2018-12-08 (×2): qty 1

## 2018-12-08 MED ORDER — PREDNISONE 20 MG PO TABS: ORAL_TABLET | ORAL | 0 refills | Status: DC

## 2018-12-08 MED ORDER — PREDNISONE 20 MG PO TABS: 30.0000 mg | ORAL_TABLET | Freq: Every day | ORAL | Status: DC

## 2018-12-08 MED ORDER — LORAZEPAM 2 MG/ML PO CONC: 0.5000 mg | ORAL | 0 refills | Status: AC | PRN

## 2018-12-08 MED ORDER — MORPHINE SULFATE (CONCENTRATE) 10 MG/0.5ML PO SOLN: 5.0000 mg | ORAL | 0 refills | Status: DC | PRN

## 2018-12-08 MED ORDER — MORPHINE SULFATE (CONCENTRATE) 10 MG/0.5ML PO SOLN: 5.0000 mg | ORAL | 0 refills | Status: AC | PRN

## 2018-12-08 MED ORDER — PREDNISONE 20 MG PO TABS: ORAL_TABLET | ORAL | 0 refills | Status: AC

## 2018-12-08 MED ORDER — GLYCOPYRROLATE 1 MG PO TABS: 1.0000 mg | ORAL_TABLET | Freq: Three times a day (TID) | ORAL | 0 refills | Status: DC

## 2018-12-08 MED ORDER — MORPHINE SULFATE (CONCENTRATE) 10 MG/0.5ML PO SOLN
5.0000 mg | ORAL | Status: DC | PRN
  Administered 2018-12-08: 10 mg via SUBLINGUAL
  Filled 2018-12-08: qty 0.5

## 2018-12-08 NOTE — Progress Notes (Addendum)
Patient ID: [name], male   DOB: 05/08/1956, 63 y.o.   MRN: [id number]     Advanced Heart Failure Rounding Note  PCP-Cardiologist: [name] Dolly, MD   Subjective:   Wants to go home today with Hospice.   Remains SOB with exertion.   Objective:   Weight Range: 83.3 kg Body mass index is 24.91 kg/m.   Vital Signs:   Temp:  [97.4 F (36.3 C)-98 F (36.7 C)] 98 F (36.7 C) (03/06 0732) Pulse Rate:  [82-130] 99 (03/06 0732) Resp:  [10-30] 19 (03/06 0732) BP: (107-154)/(46-78) 138/78 (03/06 0732) SpO2:  [86 %-98 %] 97 % (03/06 0732) Last BM Date: 12/06/18  Weight change: Filed Weights   12/04/18 0639 12/05/18 0500 12/06/18 0328  Weight: 81.3 kg 82.1 kg 83.3 kg    Intake/Output:   Intake/Output Summary (Last 24 hours) at 12/08/2018 0742 Last data filed at 12/08/2018 0733 Gross per 24 hour  Intake 767.98 ml  Output 4235 ml  Net -3467.02 ml     Physical Exam   General:  Sitting on the side of the bed.  HEENT: normal Neck: supple. JVP 10-11. Carotids 2+ bilat; no bruits. No lymphadenopathy or thryomegaly appreciated. Cor: PMI nondisplaced. Regular rate & rhythm. No rubs, gallops or murmurs. Lungs: clear on  Abdomen: soft, nontender, nondistended. No hepatosplenomegaly. No bruits or masses. Good bowel sounds. Extremities: no cyanosis, clubbing, rash, R and LLE 1+ edema. RUE PICC Neuro: alert & orientedx3, cranial nerves grossly intact. moves all 4 extremities w/o difficulty. Affect flat    Telemetry   NSR BiV 100s.   EKG    No new tracings.    Labs    CBC Recent Labs    12/06/18 0347  12/07/18 0451 12/08/18 0445  WBC 5.6  --  7.1 5.5  NEUTROABS 4.0  --   --   --   HGB 7.2*   < > 7.9* 8.0*  HCT 23.9*   < > 26.1* 25.9*  MCV 96.0  --  95.3 94.9  PLT 146*  --  143* 138*   < > = values in this interval not displayed.   Basic Metabolic Panel Recent Labs    12/07/18 0451 12/08/18 0445  NA 133* 135  K 4.5 4.2  CL 91* 93*  CO2 33* 35*  GLUCOSE  161* 146*  BUN 66* 71*  CREATININE 2.34* 1.84*  CALCIUM 8.7* 8.5*   Liver Function Tests No results for input(s): AST, ALT, ALKPHOS, BILITOT, PROT, ALBUMIN in the last 72 hours. No results for input(s): LIPASE, AMYLASE in the last 72 hours. Cardiac Enzymes No results for input(s): CKTOTAL, CKMB, CKMBINDEX, TROPONINI in the last 72 hours.  BNP: BNP (last 3 results) Recent Labs    09/04/18 1517 10/16/18 1536 11/26/18 1645  BNP 605.0* 2,147.0* 1,629.0*   ProBNP (last 3 results) No results for input(s): PROBNP in the last 8760 hours.  D-Dimer No results for input(s): DDIMER in the last 72 hours. Hemoglobin A1C No results for input(s): HGBA1C in the last 72 hours. Fasting Lipid Panel No results for input(s): CHOL, HDL, LDLCALC, TRIG, CHOLHDL, LDLDIRECT in the last 72 hours. Thyroid Function Tests No results for input(s): TSH, T4TOTAL, T3FREE, THYROIDAB in the last 72 hours.  Invalid input(s): FREET3  Other results:  Imaging   No results found.  Medications:    Scheduled Medications: . sodium chloride   Intravenous Once  . sodium chloride   Intravenous Once  . arformoterol  15 mcg Nebulization BID  .  budesonide (PULMICORT) nebulizer solution  0.5 mg Nebulization BID  . feeding supplement (ENSURE ENLIVE)  237 mL Oral BID BM  . ferrous sulfate  325 mg Oral BID  . glycopyrrolate  0.2 mg Intravenous QID  . hydrALAZINE  25 mg Oral Q8H  . insulin aspart  0-20 Units Subcutaneous TID WC  . insulin aspart  0-5 Units Subcutaneous QHS  . insulin aspart  6 Units Subcutaneous TID WC  . insulin glargine  24 Units Subcutaneous Daily  . ipratropium  0.5 mg Nebulization TID  . isosorbide mononitrate  60 mg Oral Daily  . levalbuterol  1.25 mg Nebulization TID  . mouth rinse  15 mL Mouth Rinse BID  . methylPREDNISolone (SOLU-MEDROL) injection  40 mg Intravenous Daily  . morphine CONCENTRATE  5 mg Sublingual QHS  . pantoprazole  40 mg Oral QAC breakfast  . primidone  50 mg Oral  Daily  . sertraline  50 mg Oral Daily  . sodium chloride flush  10-40 mL Intracatheter Q12H    Infusions: . sodium chloride 10 mL/hr at 12/07/18 2000  . ceFEPime (MAXIPIME) IV 2 g (12/07/18 1519)  . furosemide (LASIX) infusion 15 mg/hr (12/07/18 2029)  . milrinone 0.125 mcg/kg/min (12/07/18 2100)  . vancomycin 1,250 mg (12/07/18 1639)    PRN Medications: acetaminophen **OR** acetaminophen, albuterol, LORazepam **OR** LORazepam, morphine CONCENTRATE, nitroGLYCERIN  Patient Profile   [name] is a 63 y.o. male  with h/o Paroxysmal Afib, CAD, OA of both hands, chronic combined systolic/diastolic CHF, CHB s/p [employer] BiV ICD, Carpal Tunnel syndrome, h/o Colon CA, COPD, DM2, peripheral neuropathy, ETOH abuse, GERD, HLD, HTN, and h/o tobacco abuse.   Admitted 11/26/2018 with worsening DOE and edema despite adjustment of outpatient diuretics. Moved to [facility] 11/29/18 with low output by PICC line.   Assessment/Plan   1. Acute on chronic systolic CHF: Ischemic cardiomyopathy. [employer] CRT-D device.   Echo in 12/19 with EF 20-25%, moderately dilated RV with mildly decreased systolic function, mild AS, moderate MR, moderate TR, PASP 50 mmHg.  Echo this admission with EF 20-25%, normal RV, moderate MR, PASP 52 mmHg.  PICC in place, co-ox suggested low output at 49%.  We have discussed LVAD with him and he was seen by Dr. Prescott [name].  Not thought to be a candidate at this point due to severe COPD on PFTs (though may be possible in future if PFTs improve off cigarettes for longer term and with diuresis => CT chest did not show impressive emphysema), however patient also does not think he would want LVAD.   - Stop milrinone today given the change to Hospice. ICD has been deactivated. - Stop lasix drip. Start torsemide 100 mg twice a day.   -Continue hydralazine at 50 mg TID and Imdur 60 mg daily. - Stop spiro with elevated creatinine.   2. CAD: Anterior MI in 2006 with PCI to LAD.  DES for  in-stent restenosis in 2007.  No cath/intervention since that time. No chest pain.   - Given warfarin anticoagulation, he is not on ASA. Was on heparin gtt pending cath but stopped 3/4 with hemoptysis.   - Stop statin with transition to Hospice.  3. AKI on CKD stage 3:  Cr 1.7 -> 1.84 -> 1.65 -> 1.7 -> 1.56 -> 1.65 -> 1.86 -> 1.8->2.34 ->1.8. Suspect cardiorenal syndrome.  4. COPD: He recently quit smoking.  Apparently, home oxygen was recommended 7 years ago. Severe COPD by PFTs  2/28. FEV1 1.33 (35%) DLCO  33%. However, CT chest last week was more consistent with mild residual pulmonary edema and showed only mild emphysema.  Suspect COPD exacerbation may be playing a significant role in current symptoms. He is afebrile, normal WBCs but now with ongoing hemoptysis.  - Continue Solumedrol 40 mg IV daily and nebs. Sed Rate 37.  - Off heparin gtt with hemoptysis.  - HCAP coverage with vancomycin/cefepime. Stop after today.  - He does not want to pursue bronchoscopy given likelihood of ongoing mechanical ventilation.      5. Type II diabetes: Diabetes coordinator following, increased insulin with steroids.    6. Atrial fibrillation: Paroxysmal. He has been in NSR.  He was on heparin gtt and off warfarin but with hemoptysis and falling hgb, this was stopped.   7. Severe protein-calorie malnutrition - encouraged po intake - Continue Ensure 8. Anemia:  No overt GI bleeding.  Not Fe deficient. He got 1 unit PRBCs on 3/3 3/4 3/5  - Off heparin with hemoptysis.  9. DNR  Palliative Care Following. Home with hospice. ICD deactivated.   He would like to go home today with [facility]. He is requesting ambulance for transfer home. Palliative Care helping with transition. He is at peace with his decision to go home with Hospice and understands he has limited time to live.    Length of Stay: [place], NP  12/08/2018, 7:42 AM  Advanced Heart Failure Team Pager [phone] (M-F; 7a - 4p)    Please contact [facility] for night-coverage after hours (4p -7a ) and weekends on [email or web address]  Patient seen with NP, agree with the above note.   This morning, creatinine lower but BUN higher.  Hgb up to 8 after another unit PRBCs yesterday.  Hemoptysis has resolved after stopping anticoagulation.  Still on high flow oxygen and sitting on the side of the bed.   Better diuresis.   Palliative care service following, patient has decided on hospice care at home at this point.  I think this is a reasonable decision.  He has low output/end stage CHF and LVAD is not an option with severe lung disease. He continues to require high flow oxygen, will arrange for home with hospice.  Will stop milrinone today and Lasix gtt.  He will continue on torsemide 100 mg bid at home.  His ICD tachy therapies have been inactivated.   Will plan for home with hospice today per patient's wishes.   Loralie Champagne 12/08/2018 9:22 AM

## 2018-12-08 NOTE — Care Management Note (Addendum)
Case Management Note Previous Note Created by [facility]  Patient Details  Name: [name] MRN: [id number] Date of Birth: 05/08/1956  Subjective/Objective:     Admitted with CHF. Pt from home, ind with ADL's. Pt has insurance and PCP. Admission last month for same. Refused Home health at that time. Discussed home health RN with patient for disease management, he is agreeable. Discussed [facility] [facility], however patient will not be eligible as he has a pacemaker. Will still order [facility] RN at time of DC.  Patient reports he has a home oxygen concentrator at home, unsure of agency, reports he does not utilize it often.           Has PCP. Will have cardiology follow up. Has transportation.  Action/Plan: DC home with home health RN. Will refer to [facility].  Expected Discharge Date:   unk               Expected Discharge Plan:  [place]  In-House Referral:     Discharge planning Services  CM Consult  Post Acute Care Choice:  Home Health Choice offered to:  Patient  DME Arranged:    DME Agency:     HH Arranged:  RN, Disease Management [place] Agency:  [facility] (now [facility])  Status of Service:  Completed, signed off  If discussed at [place] of Stay Meetings, dates discussed:    Additional Comments: 12/08/2018  CM confirmed with both [facility] and [employer] - all orders received.  Pts wife confirmed both oxygen and hospital bed have been delivered to home.  Bedside nurse confirmed receipt of all prescriptions by [facility].  Pt denied questions or concerns with discharge.  Wife has contact information for Hospice and hospice aware that pt will discharge home shortly  CM faxed DME orders to [facility].  CM confirmed that [facility] can accept up to 15 HF.  Nurse will call [facility] once equipment is confirmed to be delivered in the home.     Cassandra with [facility] confirmed pt has been accepted in  program and can be admitted today.  All equipment will be delivered to his home during the 1 o'clock hour.  [facility] spoke directly with palliative NP to determine what type/amount  of oxygen would be sufficient for pt in the  Home- order to follow.  Once DME orders are received CM will fax to [employer] at [phone] per request of [facility].    CM spoke with Cassandra and made referral.  Cassandra to follow up with CM regarding maximum oxygen capability in the home   Milrinone and Lasix drips were discontined this am. Pt confirmed that he is ready to discharge home with hospice - pts family will provide needed care in the home.  CM gave pt choice of hospice agency in the home per medicare.gov list - pt chose [facility].  CM left message for transition home liaison coordinator Cassandra with [facility].  Pt informed CM that he will only need oxygen in the home and a hospital bed.  Pt will need to transport home via ambulance   12/07/18 Pt started on lasix drip yesterday with inadequate output, pt has ongoing hemoptysis and therefore heparin d/c.  Pt also on milrinone and IV antibiotics.  Palliative care following - pt may chose Home with Hospice once he is medically stable for discharge.  CM will continue to follow  12/06/18 Pt remains  short of breath, CVP in low teens, remains on milrinone, requiring both IV steroids and lasix.  CM will continue to follow for discharge needs   12/01/18 Pt transferred to [facility].  Pt remains on milrinone drip and IV heparin.  Plans if for Cath on 3/2 and potential LVAD workup. Maryclare Labrador, RN 12/08/2018, 8:42 AM

## 2018-12-08 NOTE — Progress Notes (Signed)
Transport at bedside to take patient home. Belongings and discharge paperwork sent with patient. Patient's wife called and updated. Patient's vitals at time of discharge blood pressure 131/92 (104). Heart rate 84. Oxygenation 99 on 10L HFNC. Patient alert and oriented and pain free at time of discharge.

## 2018-12-08 NOTE — Discharge Summary (Signed)
Advanced Heart Failure Team  Discharge Summary   Patient ID: [name] MRN: [id number], DOB/AGE: 1955/10/06 63 y.o. Admit date: 11/26/2018 D/C date:     12/08/2018   Primary Discharge Diagnoses:  1. A/C Systolic HF 2. CAD 3. AKI on CKD Stage III 4. COPD 5.  DMII 6. PAF 7. Severe Protein Calorie Malnutrition 8. Anemia  9. DNR --> [facility]  10 Hemoptysis  Hospital Course:   [name] is a 63 y.o. male with h/o Paroxysmal Afib, CAD, OA of both hands, chronic combined systolic/diastolic CHF, CHB s/p [employer] BiV ICD, Carpal Tunnel syndrome, h/o Colon CA, COPD, DM2, peripheral neuropathy, ETOH abuse, GERD, HLD, HTN, and h/o tobacco abuse.   Admitted 11/26/2018 with worsening DOE and edema despite adjustment of outpatient diuretics. Initially diuresed IV but due to sluggish diuresis HF Team was consulted. He was moved to ICU on 11/29/18 with low output by PICC line.Due to low output heart failure he was placed on milrinone. He was considered for LVAD but ultimately this was not an option due to severe COPD. Palliative Care was consulted and he decided to transition to comfort care and discharge home with [facility]. He would like to go home today with [facility]. He is requesting ambulance for transfer home. Palliative Care helping with transition. He is at peace with his decision to go home with Hospice and understands he has limited time. Gold DNR form was completed. He will continue 10 liters of oxygen via nasal cannula.    1. . Acute on chronic systolic CHF: Ischemic cardiomyopathy. [employer] CRT-D device.  Echo in 12/19 with EF 20-25%, moderately dilated RV with mildly decreased systolic function, mild AS, moderate MR, moderate TR, PASP 50 mmHg. Echo this admission with EF 20-25%, normal RV, moderate MR, PASP 52 mmHg.  PICC in place, co-ox suggested low output at 49%. We discussed LVAD with him and he was seen by Dr.  Prescott Gum.  Not thought to be a candidate at this point due to severe COPD on PFTs (though may be possible in future if PFTs improve off cigarettes for longer term and with diuresis => CT chest did not show impressive emphysema), however patient did not want to pursue LVAD.   - He was placed on milrinone until he transitioned to comfort care.  - ICD was deactivated. Diuresed with high dose lasix and transitioned to torsemide 100 mg twice a day.  Continue hydralazine at 50 mg TID and Imdur 60 mg daily for now then he will stop as he declines.    2. CAD: Anterior MI in 2006 with PCI to LAD. DES for in-stent restenosis in 2007. No cath/intervention since that time. No chest pain.   - Given warfarin anticoagulation, he is not on ASA. Was on heparin gtt pending cath but stopped 3/4 with hemoptysis. We will not restart coumadin with transition to comfort care.    - Stop statin with transition to Hospice.  3. AKI on CKD stage 3:   Renal function was followed closely.  Suspect cardiorenal syndrome.  4. COPD: He recently quit smoking. Apparently, home oxygen was recommended 7 years ago. Severe COPD by PFTs  2/28. FEV1 1.33 (35%) DLCO 33%. However, CT chest last week was more consistent with mild residual pulmonary edema and showed only mild emphysema.  Suspect COPD exacerbation may be playing a significant role in current symptoms. He had significant hemoptysis so heparin was stopped. CCM consulted  for possible bronch but this was not pursued due to risk of long term mechanical ventilation.  He was placed on solumedrol and he will continue prednisone taper.  - HCAP coverage with vancomycin/cefepime.       5. Type II diabetes: Diabetes coordinator followed with insulin adjusted. Plan to resume home insulin and decrease after he completes steroids. .   6. Atrial fibrillation: Paroxysmal. He has been in NSR.  He was on heparin gtt and off warfarin but with hemoptysis and falling hgb, this was stopped.   7.  Severe protein-calorie malnutrition - Continue Ensure 8. Anemia:  He got 1 unit PRBCs on 3/3 3/4 3/5  - Off heparin with hemoptysis.  9. DNR  Palliative Care Following. Home with hospice. ICD deactivated.   Discharge Vitals: Blood pressure (!) 116/55, pulse 92, temperature 98 F (36.7 C), temperature source Oral, resp. rate 11, height 6' (1.829 m), weight 83.3 kg, SpO2 97 %.  Labs: Lab Results  Component Value Date   WBC 5.5 12/08/2018   HGB 8.0 (L) 12/08/2018   HCT 25.9 (L) 12/08/2018   MCV 94.9 12/08/2018   PLT 138 (L) 12/08/2018    Recent Labs  Lab 12/08/18 0445  NA 135  K 4.2  CL 93*  CO2 35*  BUN 71*  CREATININE 1.84*  CALCIUM 8.5*  GLUCOSE 146*   Lab Results  Component Value Date   CHOL 115 12/01/2018   HDL 59 12/01/2018   LDLCALC 36 12/01/2018   TRIG 100 12/01/2018   BNP (last 3 results) Recent Labs    09/04/18 1517 10/16/18 1536 11/26/18 1645  BNP 605.0* 2,147.0* 1,629.0*    ProBNP (last 3 results) No results for input(s): PROBNP in the last 8760 hours.   Diagnostic Studies/Procedures   No results found.  Discharge Medications   Allergies as of 12/08/2018      Reactions   Vancomycin Itching, Rash      Medication List    STOP taking these medications   ACIDOPHOLUS PO   magnesium oxide 400 MG tablet Commonly known as:  MAG-OX   metolazone 2.5 MG tablet Commonly known as:  ZAROXOLYN   multivitamin tablet   simvastatin 40 MG tablet Commonly known as:  ZOCOR   warfarin 4 MG tablet Commonly known as:  COUMADIN     TAKE these medications   acetaminophen 500 MG tablet Commonly known as:  TYLENOL Take 500 mg by mouth every 6 (six) hours as needed for mild pain or moderate pain.   albuterol (2.5 MG/3ML) 0.083% nebulizer solution Commonly known as:  PROVENTIL Take 2.5 mg by nebulization every 6 (six) hours as needed. For shortness of breath   albuterol 108 (90 Base) MCG/ACT inhaler Commonly known as:  PROVENTIL HFA;VENTOLIN  HFA Inhale 2 puffs into the lungs every 6 (six) hours as needed. For shortness of breath   ferrous sulfate 325 (65 FE) MG EC tablet Take 325 mg by mouth 2 (two) times daily.   glycopyrrolate 1 MG tablet Commonly known as:  ROBINUL Take 1 tablet (1 mg total) by mouth 3 (three) times daily. PER Hospice   insulin glargine 100 UNIT/ML injection Commonly known as:  LANTUS Inject 20 Units into the skin daily as needed (for high blood sugar).   LORazepam 0.5 MG tablet Commonly known as:  ATIVAN Take 1 tablet (0.5 mg total) by mouth every 4 (four) hours as needed for anxiety. Per Hospice   LORazepam 2 MG/ML concentrated solution Commonly known as:  ATIVAN Take  0.3 mLs (0.6 mg total) by mouth every 4 (four) hours as needed for anxiety. Per hospice   morphine CONCENTRATE 10 MG/0.5ML Soln concentrated solution Place 0.25 mLs (5 mg total) under the tongue at bedtime. Per Hospice   morphine CONCENTRATE 10 MG/0.5ML Soln concentrated solution Place 0.25-0.5 mLs (5-10 mg total) under the tongue every 2 (two) hours as needed for shortness of breath. Per Hospice   nitroGLYCERIN 0.4 MG SL tablet Commonly known as:  NITROSTAT Place 1 tablet (0.4 mg total) under the tongue every 5 (five) minutes x 3 doses as needed. For chest pain   ondansetron 4 MG tablet Commonly known as:  ZOFRAN Take 4 mg by mouth 4 (four) times daily as needed for nausea or vomiting.   pantoprazole 40 MG tablet Commonly known as:  PROTONIX Take 1 tablet (40 mg total) by mouth daily before breakfast.   predniSONE 20 MG tablet Commonly known as:  DELTASONE Take 40 mg daily for 3 days then 30 mg for 2 days then 20 mg for 2 days   primidone 50 MG tablet Commonly known as:  MYSOLINE Take 50 mg by mouth daily.   sertraline 50 MG tablet Commonly known as:  ZOLOFT Take 50 mg by mouth daily.   torsemide 100 MG tablet Commonly known as:  DEMADEX Take 1 tablet (100 mg total) by mouth 2 (two) times daily. What changed:     medication strength  how much to take       Disposition   The patient will be discharged in stable condition to home with 10 liters oxygen via ambulance. [facility] will follow closely.  Discharge Instructions    Diet - low sodium heart healthy   Complete by:  As directed    Increase activity slowly   Complete by:  As directed          Duration of Discharge Encounter: Greater than 35 minutes   Signed,   NP-C  12/08/2018, 12:02 PM

## 2018-12-08 NOTE — Progress Notes (Signed)
Palliative:  I met today with [name]. He says "I feel good today." Rested well last night. Anxious to get home today "as soon as possible." Discussed plan with [name] and how to utilize PRN medication to achieve comfort and relief from anxiety and SOB. Discussed with RN to provide him with Roxanol and Ativan prior to ambulance transfer (this is [name]'s largest concern is the ambulance ride home).   I called and spoke with [name]'s wife and discussed the same as above. Re-enforced use of PRN medication to provide relief. Also explained that they are to call hospice at anytime day or night with any concerns no matter how small. She is anticipating equipment delivery for this afternoon ~1 pm. No further questions.   Emotional support provided.   Discussed d/c plans with Amy [facility]nell [facility], and Cassandra with hospice. Reviewed meds for discharge.   Exam: Alert, oriented. Mild-mod SOB at rest. No overt distress.   Plan: - D/C home with hospice (CHF/COPD) - Prednisone taper ordered - PRN roxanol for SOB and scheduled nighttime dose - PRN Ativan for anxiety relief - Scheduled Robinul for secretion prevention with CVP 13 and likelihood of fluid overload - Continue medications as he is able to tolerate po meds (some may help to give him some more quality time with his family). Hospice may review meds again once he returns home.   9 min  Vinie Sill, NP Palliative Medicine Team Pager # [phone] (M-F 8a-5p) Team Phone # [phone] (Nights/Weekends)

## 2018-12-08 NOTE — Progress Notes (Signed)
PT Cancellation Note  Patient Details Name: [name] MRN: [id number] DOB: 06-27-1956   Cancelled Treatment:    Reason Eval/Treat Not Completed: Pt planning to go home with hospice today. No further acute PT needs. PT signing off.  Ellamae Sia, PT, DPT Acute Rehabilitation Services Pager [phone] Office [phone]    Willy Eddy 12/08/2018, 8:29 AM

## 2018-12-18 ENCOUNTER — Other Ambulatory Visit: Payer: Self-pay

## 2018-12-18 ENCOUNTER — Encounter: Payer: Medicare HMO | Admitting: *Deleted

## 2018-12-19 ENCOUNTER — Telehealth: Payer: Self-pay

## 2018-12-19 NOTE — Telephone Encounter (Signed)
Spoke with patient to remind of missed remote transmission 

## 2018-12-20 LAB — CUP PACEART INCLINIC DEVICE CHECK
Brady Statistic RA Percent Paced: 6 %
Brady Statistic RV Percent Paced: 99 %
Date Time Interrogation Session: 20200212050000
HighPow Impedance: 53 Ohm
Implantable Lead Implant Date: 20140509
Implantable Lead Implant Date: 20140509
Implantable Lead Implant Date: 20140509
Implantable Lead Location: 753858
Implantable Lead Location: 753859
Implantable Lead Location: 753860
Implantable Lead Model: 293
Implantable Lead Model: 4136
Implantable Lead Model: 4555
Implantable Lead Serial Number: 118610
Implantable Lead Serial Number: 29354050
Implantable Pulse Generator Implant Date: 20140509
Lead Channel Setting Pacing Amplitude: 2 V
Lead Channel Setting Pacing Amplitude: 2 V
Lead Channel Setting Pacing Amplitude: 2.5 V
Lead Channel Setting Pacing Pulse Width: 0.4 ms
Lead Channel Setting Pacing Pulse Width: 1 ms
Lead Channel Setting Sensing Sensitivity: 0.6 mV
Lead Channel Setting Sensing Sensitivity: 1 mV
MDC IDC LEAD SERIAL: 207706
Pulse Gen Serial Number: 950230

## 2018-12-22 ENCOUNTER — Telehealth: Payer: Self-pay

## 2018-12-22 NOTE — Telephone Encounter (Signed)
Spoke with patient to remind of missed remote transmission 

## 2018-12-23 ENCOUNTER — Other Ambulatory Visit: Payer: Self-pay | Admitting: Adult Health

## 2018-12-26 NOTE — Progress Notes (Signed)
No show letter

## 2018-12-26 NOTE — Telephone Encounter (Signed)
Patient medication changed to Torsemide. Please contact Darrick Grinder NP

## 2019-01-11 DIAGNOSIS — R5381 Other malaise: Secondary | ICD-10-CM | POA: Diagnosis not present

## 2019-01-11 DIAGNOSIS — R69 Illness, unspecified: Secondary | ICD-10-CM | POA: Diagnosis not present

## 2019-01-12 ENCOUNTER — Encounter (HOSPITAL_COMMUNITY): Payer: Self-pay | Admitting: Cardiology

## 2019-02-15 ENCOUNTER — Ambulatory Visit: Payer: Self-pay | Admitting: *Deleted

## 2019-03-05 DEATH — deceased

## 2019-03-26 IMAGING — DX DG CHEST 2V
2 series · 2 of 2 positions shown · non-contrast
Comparison: 09/01/2018

CLINICAL DATA: Weakness.

EXAM:
CHEST - 2 VIEW

[chest lat]
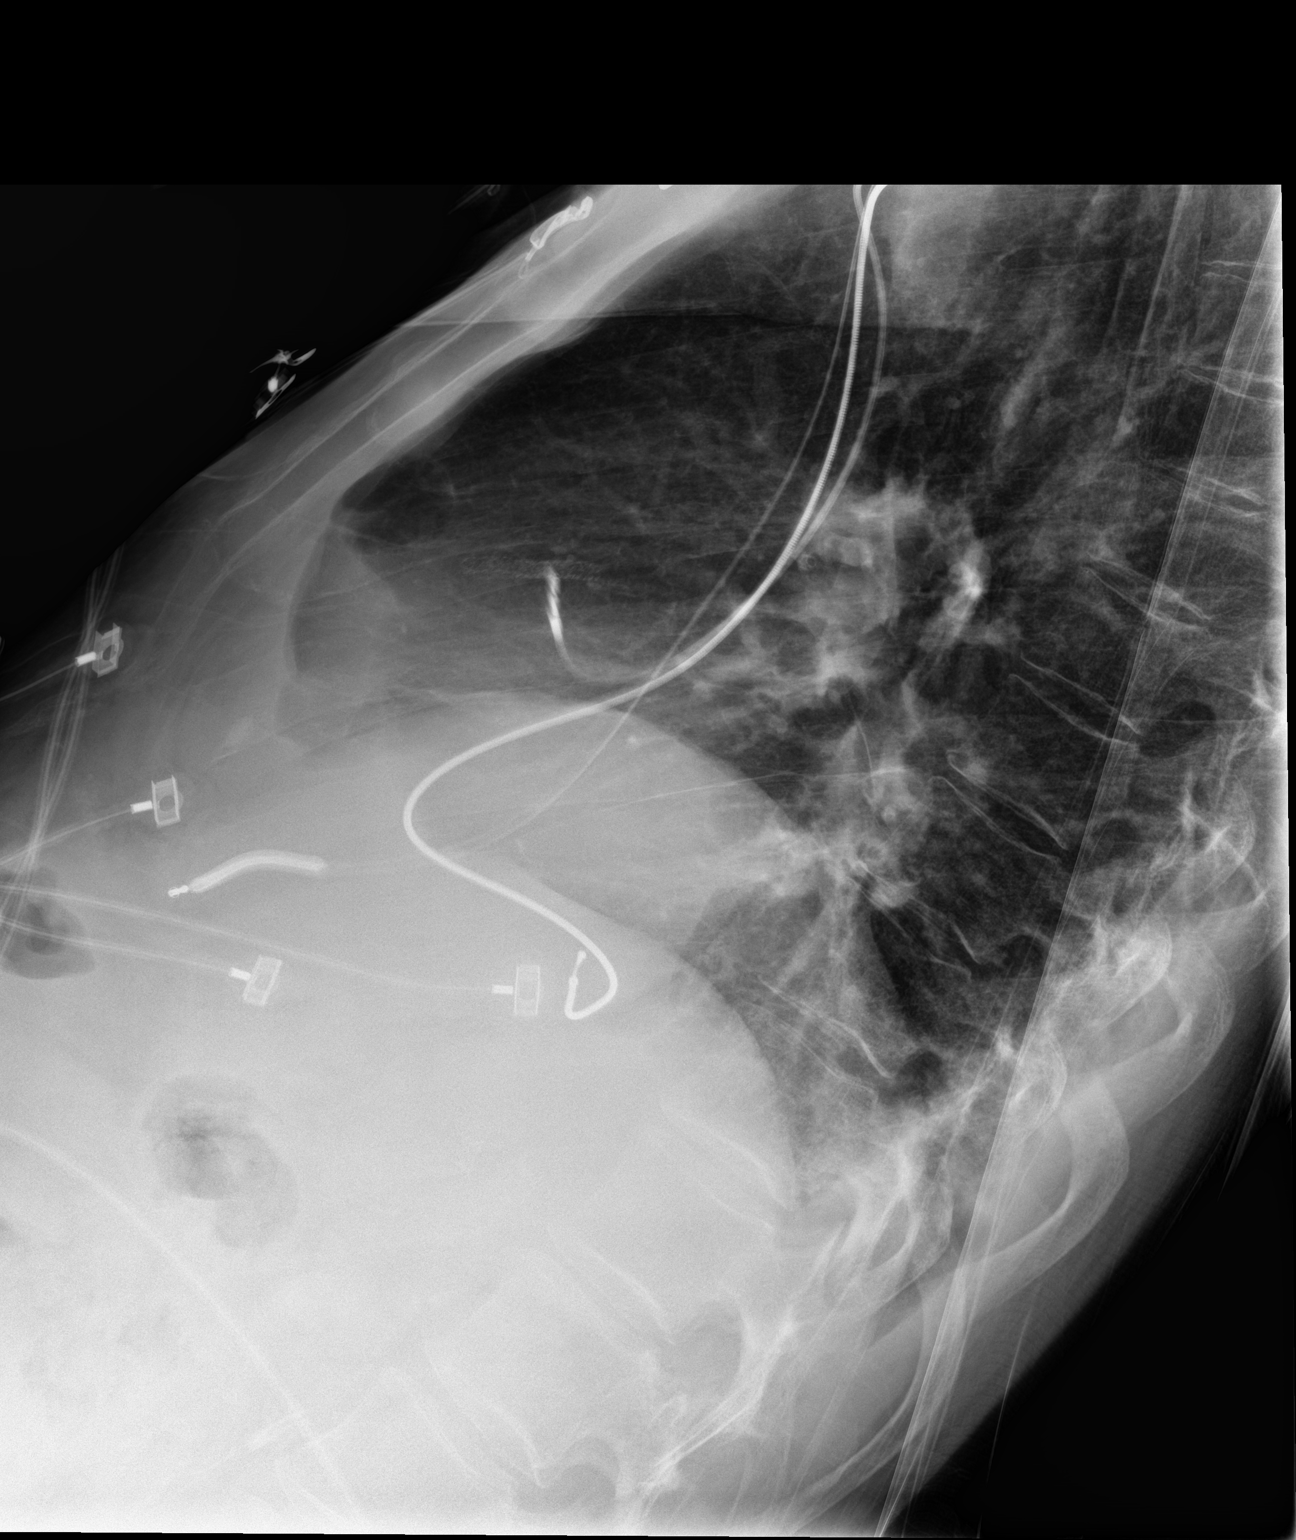

[chest ap]
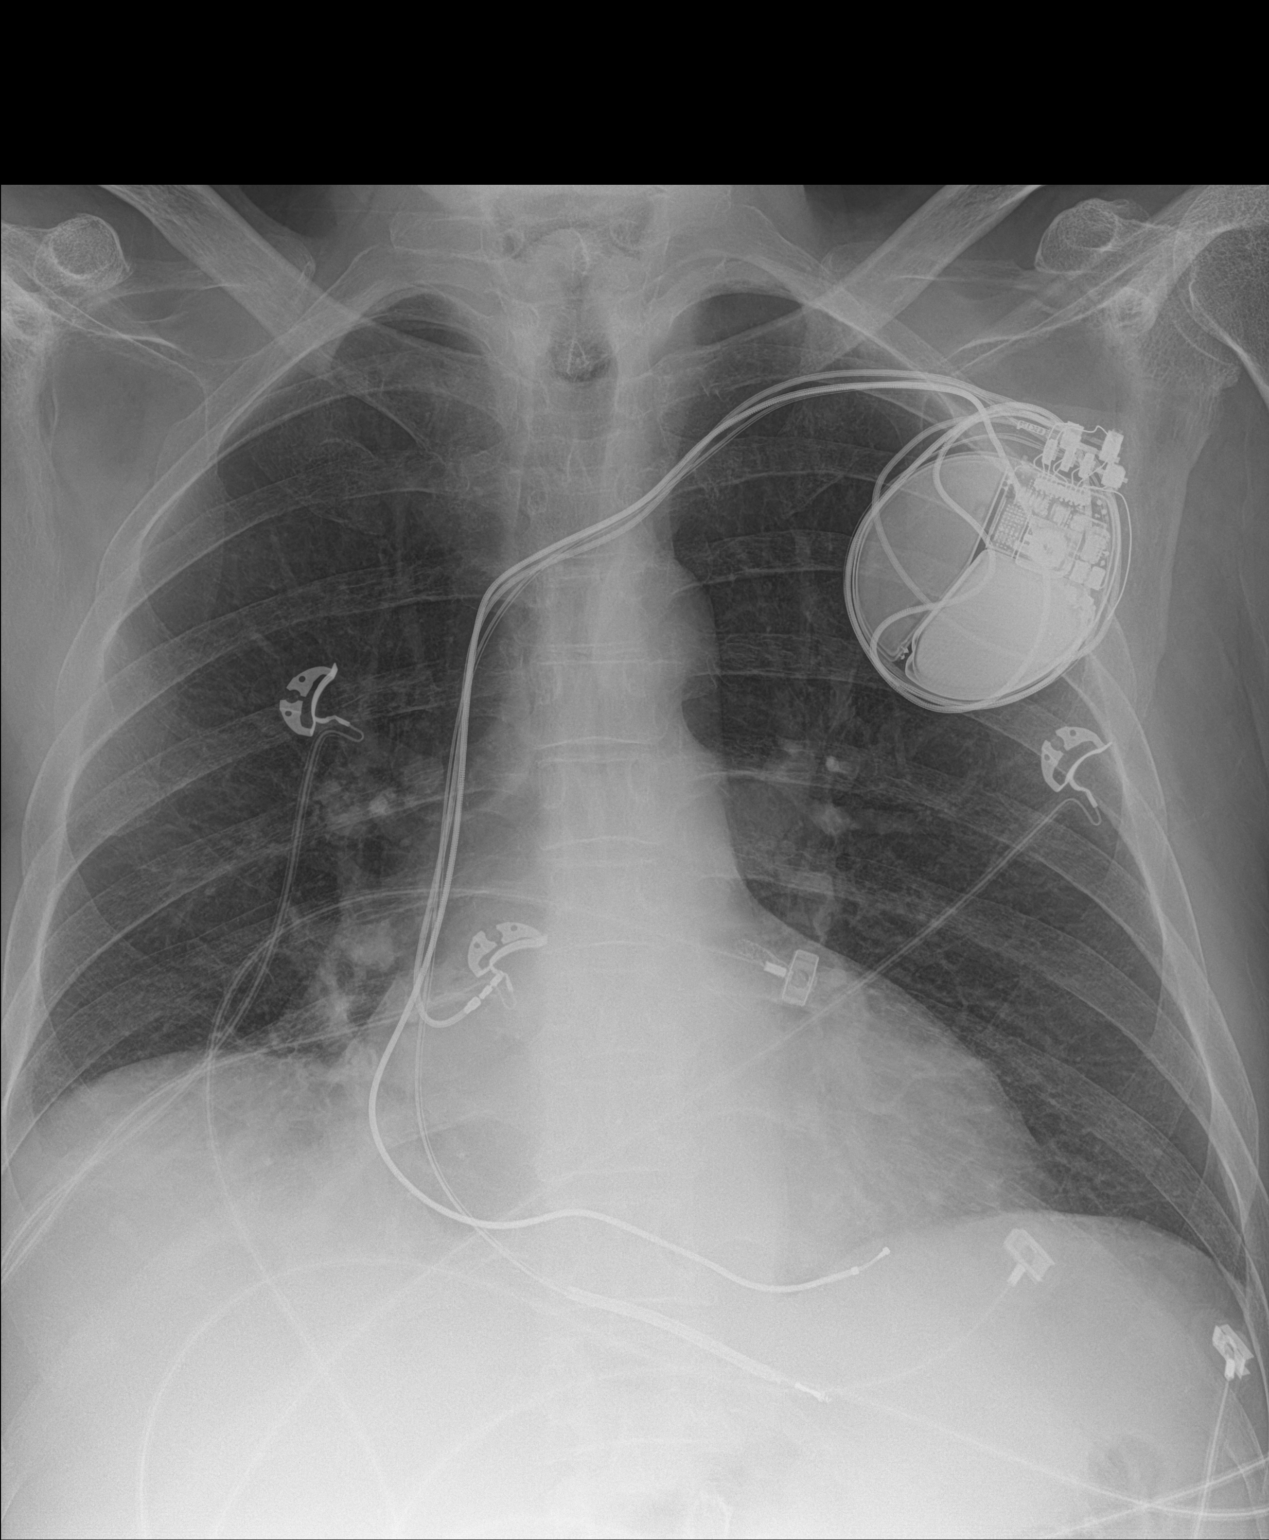

[2 of 2 positions shown; findings below may reference images not displayed]

FINDINGS: Left chest wall ICD is noted with leads in the right atrial
appendage, and right ventricle. Mild cardiac enlargement. There is
asymmetric elevation of the right hemidiaphragm. No airspace
opacities identified.
IMPRESSION: 1. No acute cardiopulmonary abnormalities.

## 2019-03-29 IMAGING — DX DG CHEST 1V PORT
1 series · 2 of 2 positions shown · non-contrast
Comparison: 09/04/2018 chest radiograph.

CLINICAL DATA: Hypoxia, dyspnea

EXAM:
PORTABLE CHEST 1 VIEW

[Series 1: chest · 0.14mm/px · 2 of 2 slices shown]
[im 1/2]
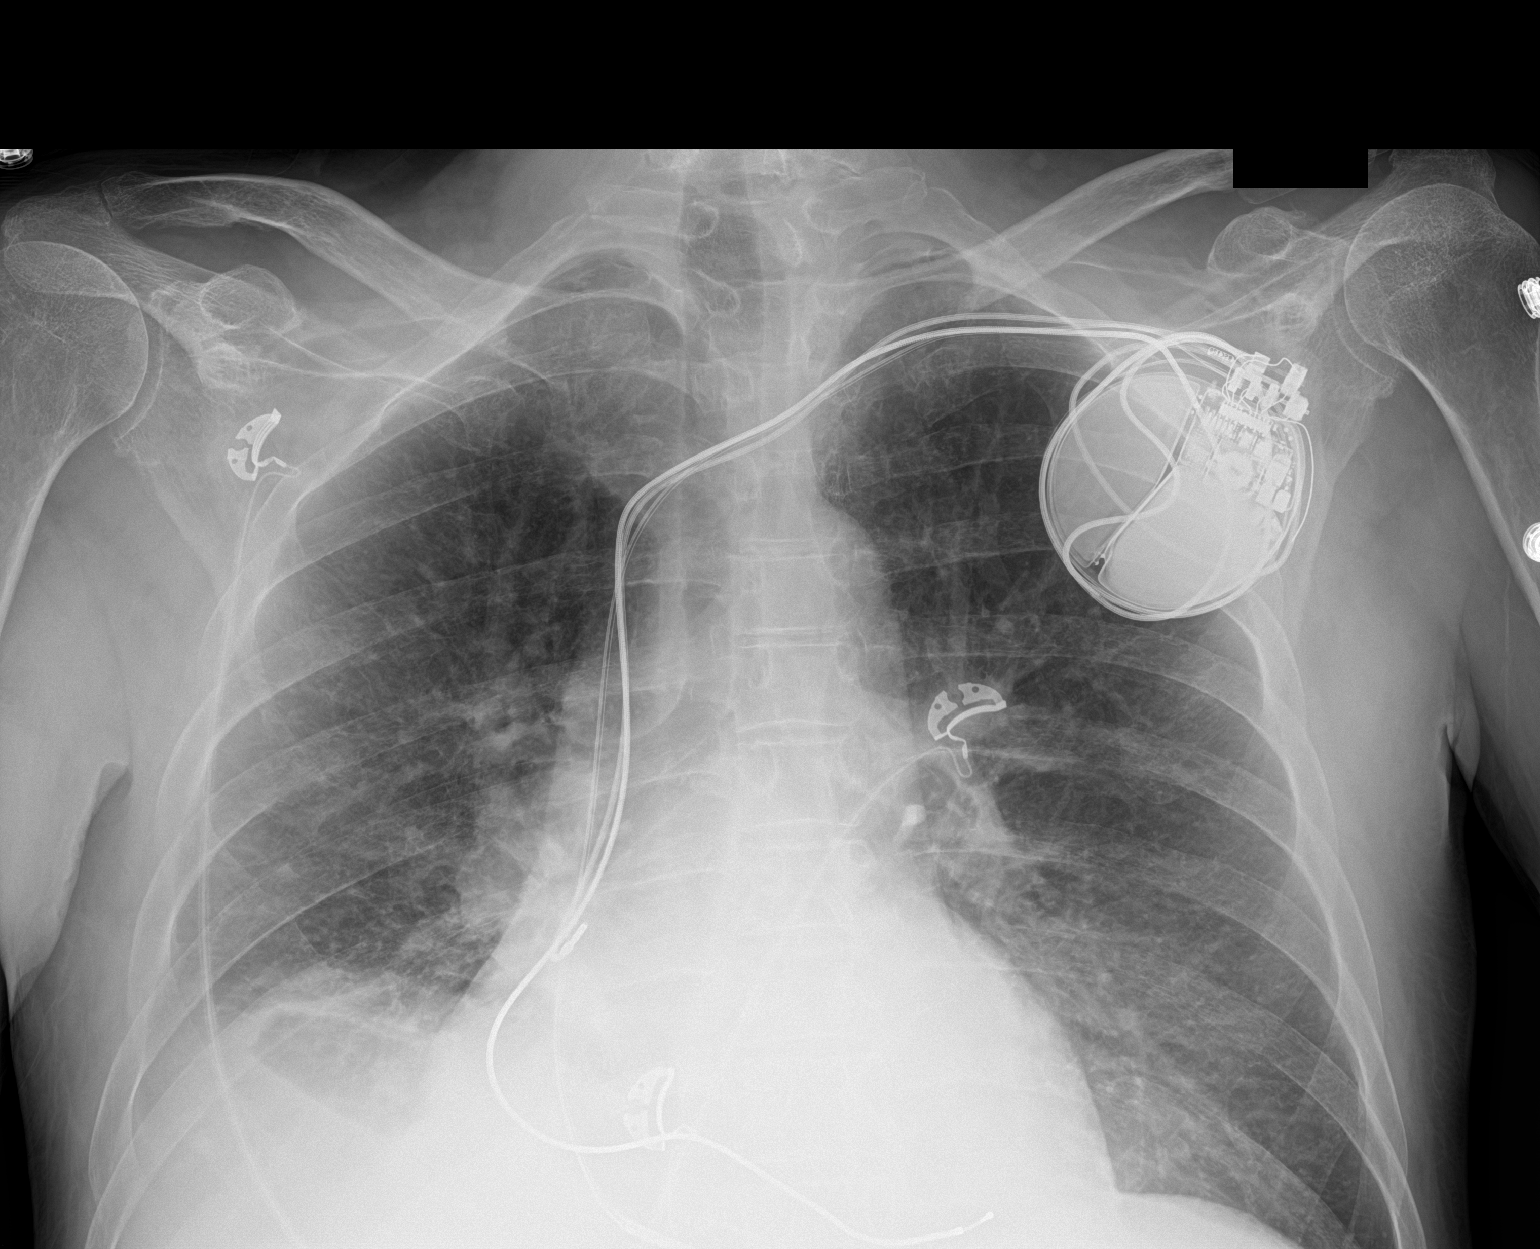
[im 2/2]
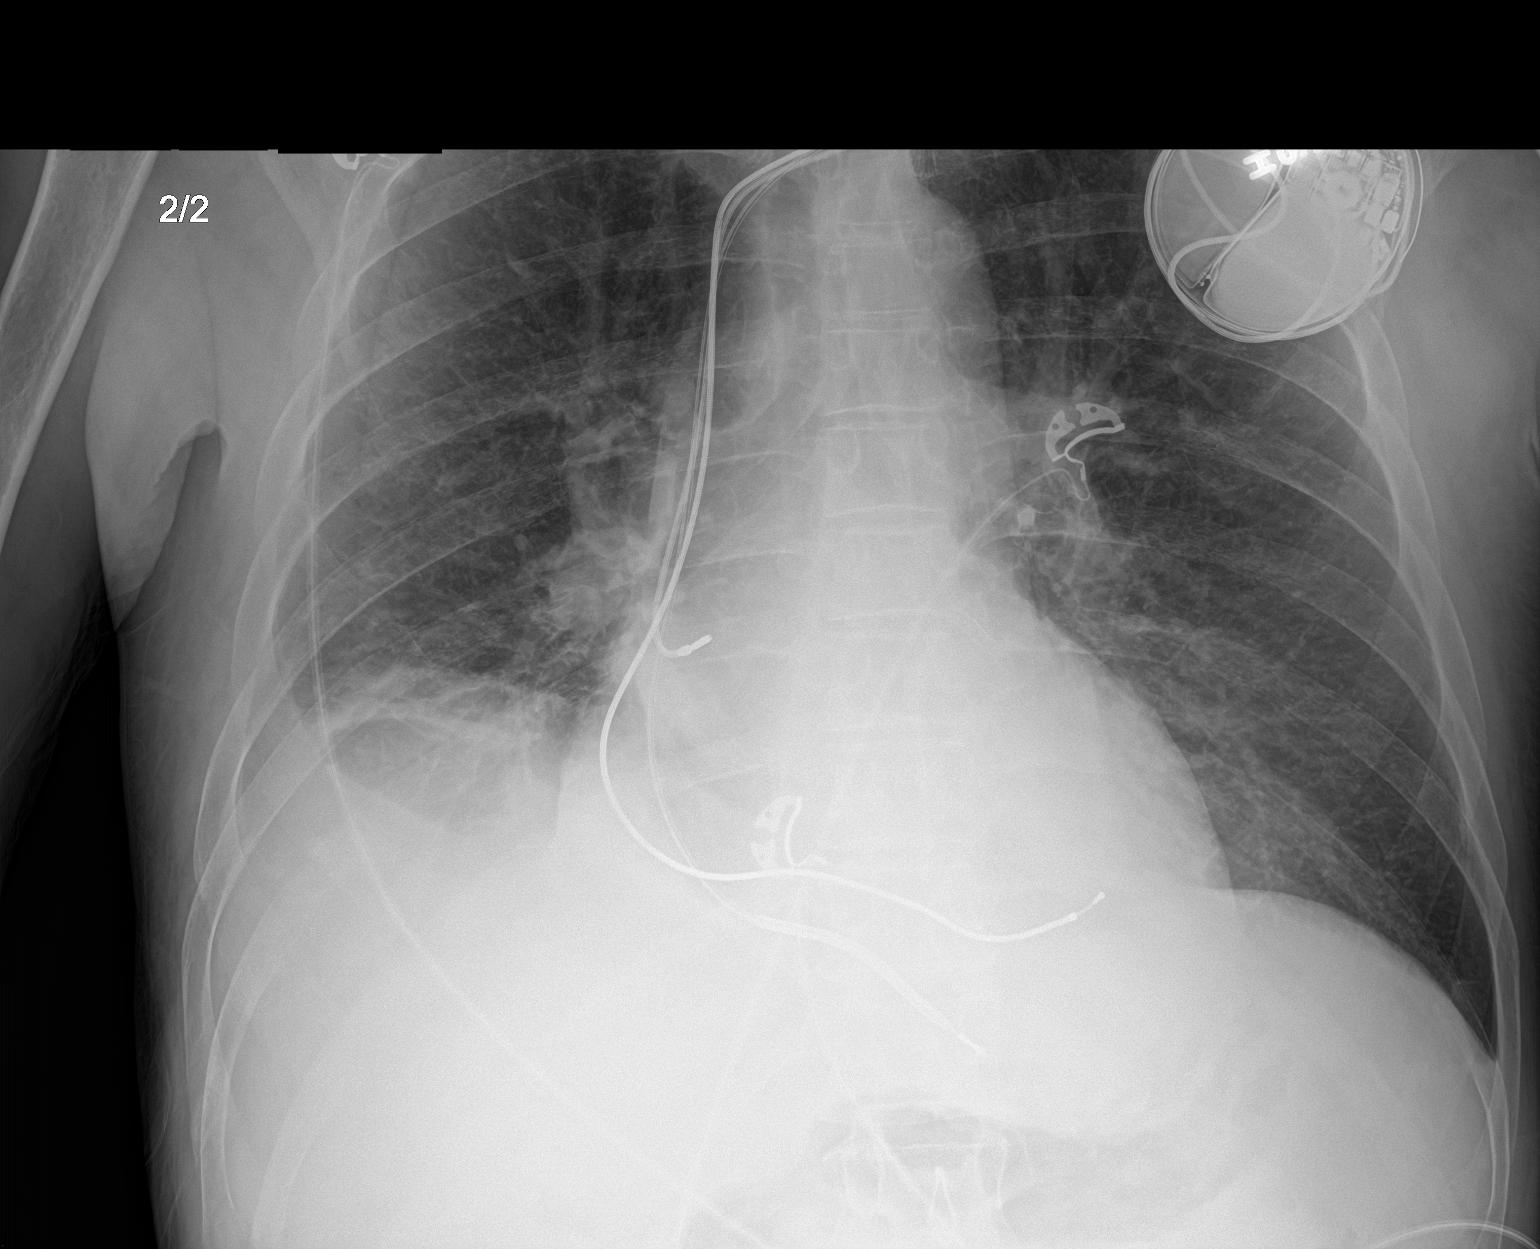

[2 of 2 positions shown; findings below may reference images not displayed]

FINDINGS: Stable configuration of 3 lead left subclavian ICD. Stable
cardiomediastinal silhouette with mild cardiomegaly. No
pneumothorax. Small right pleural effusion, slightly increased. No
left pleural effusion. Cephalization of the pulmonary vasculature
without overt pulmonary edema. Mild right lung base atelectasis.
IMPRESSION: 1. Stable mild cardiomegaly without overt pulmonary edema.
2. Small right pleural effusion and right lung base atelectasis,
mildly increased.

## 2019-05-07 IMAGING — DX DG CHEST 2V
2 series · 2 of 2 positions shown · non-contrast
Comparison: Chest x-ray dated September 07, 2018.

CLINICAL DATA: Shortness of breath.

EXAM:
CHEST - 2 VIEW

[chest lat]
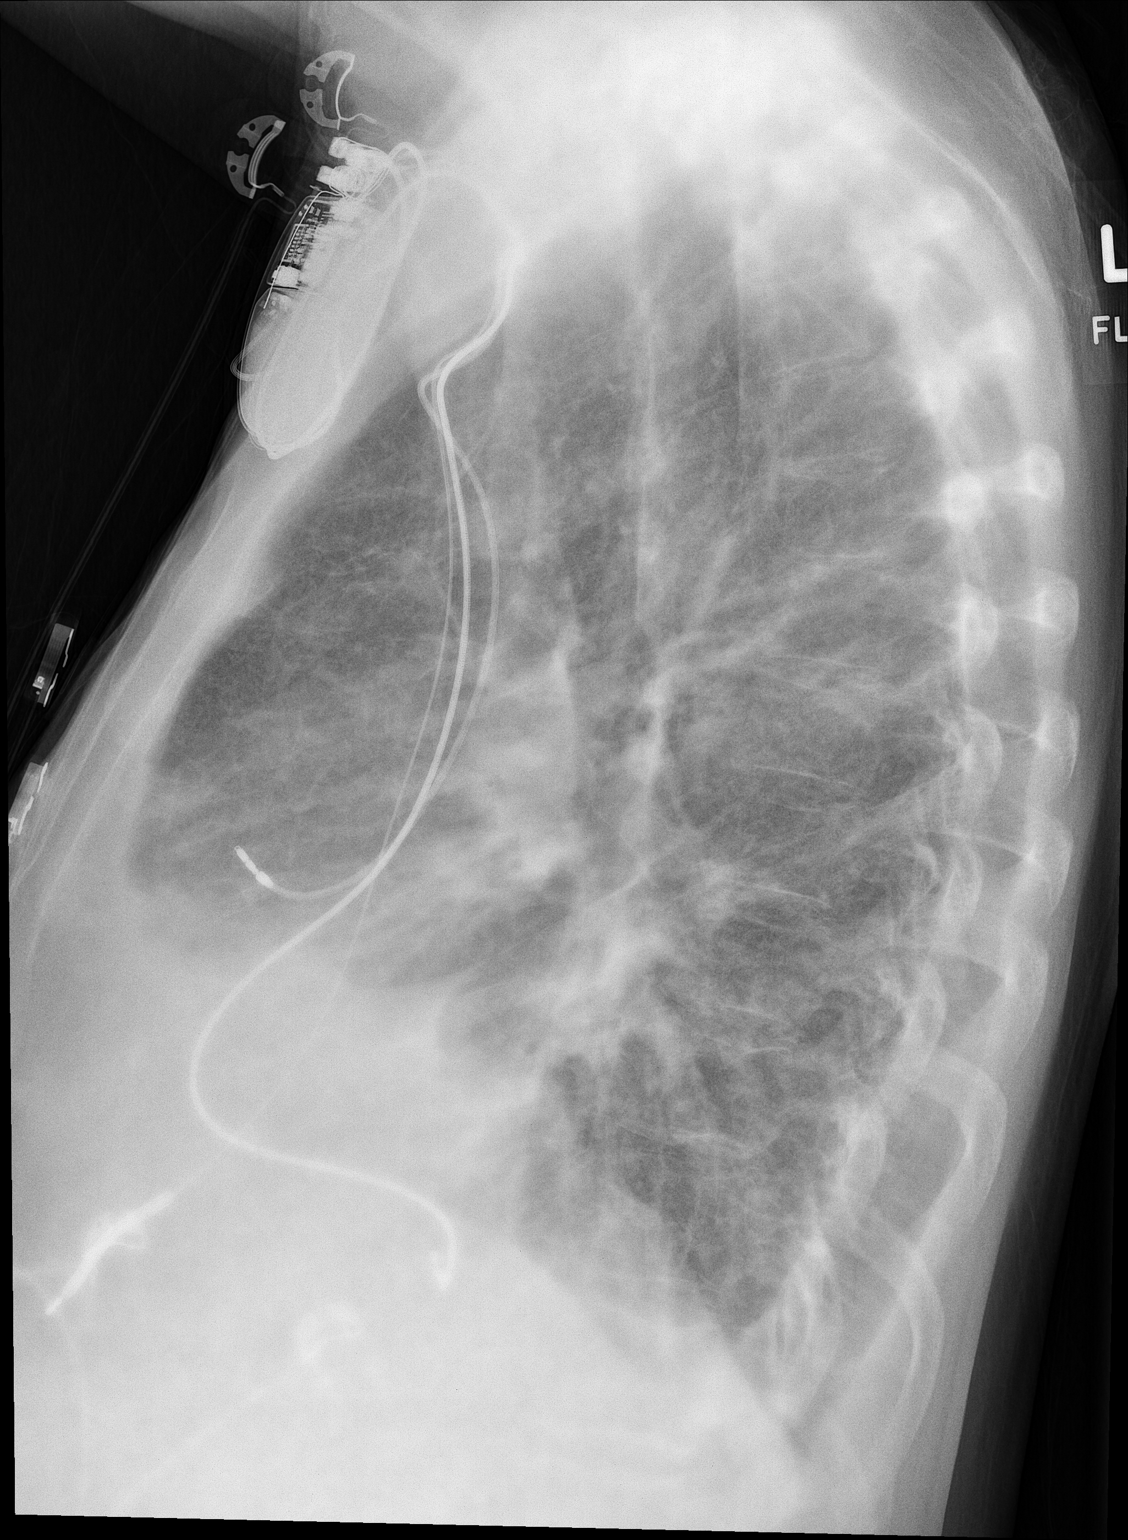

[chest pa]
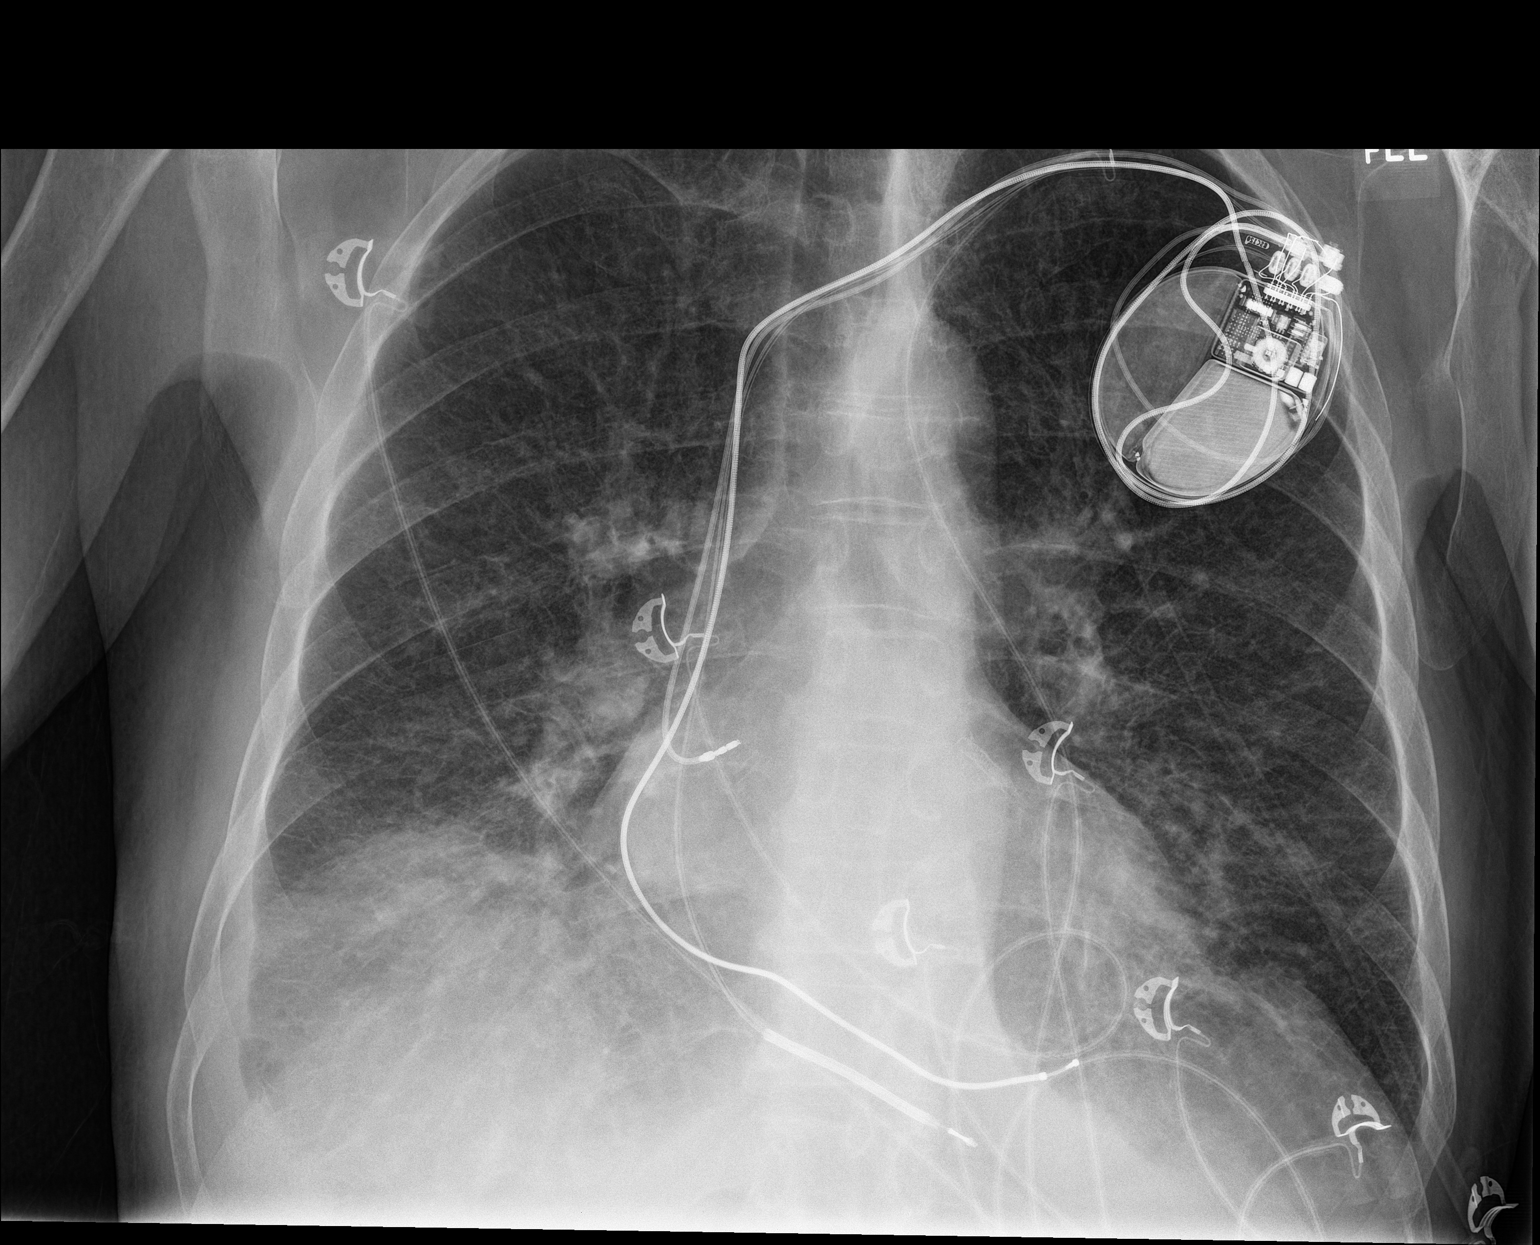

[2 of 2 positions shown; findings below may reference images not displayed]

FINDINGS: Unchanged left chest wall pacemaker. Stable mild cardiomegaly.
Progressive diffuse interstitial thickening. Right greater than left
lower lobe atelectasis. Unchanged small right pleural effusion. No
pneumothorax. No acute osseous abnormality.
IMPRESSION: 1. Progressive mild diffuse interstitial pulmonary edema.
2. Unchanged small right pleural effusion.

## 2019-06-27 IMAGING — DX DG CHEST 1V PORT
1 series · 1 of 1 positions shown · non-contrast
Comparison: 12/04/2018

CLINICAL DATA: Shortness of breath, cough, hemoptysis

EXAM:
PORTABLE CHEST 1 VIEW

[chest ap]
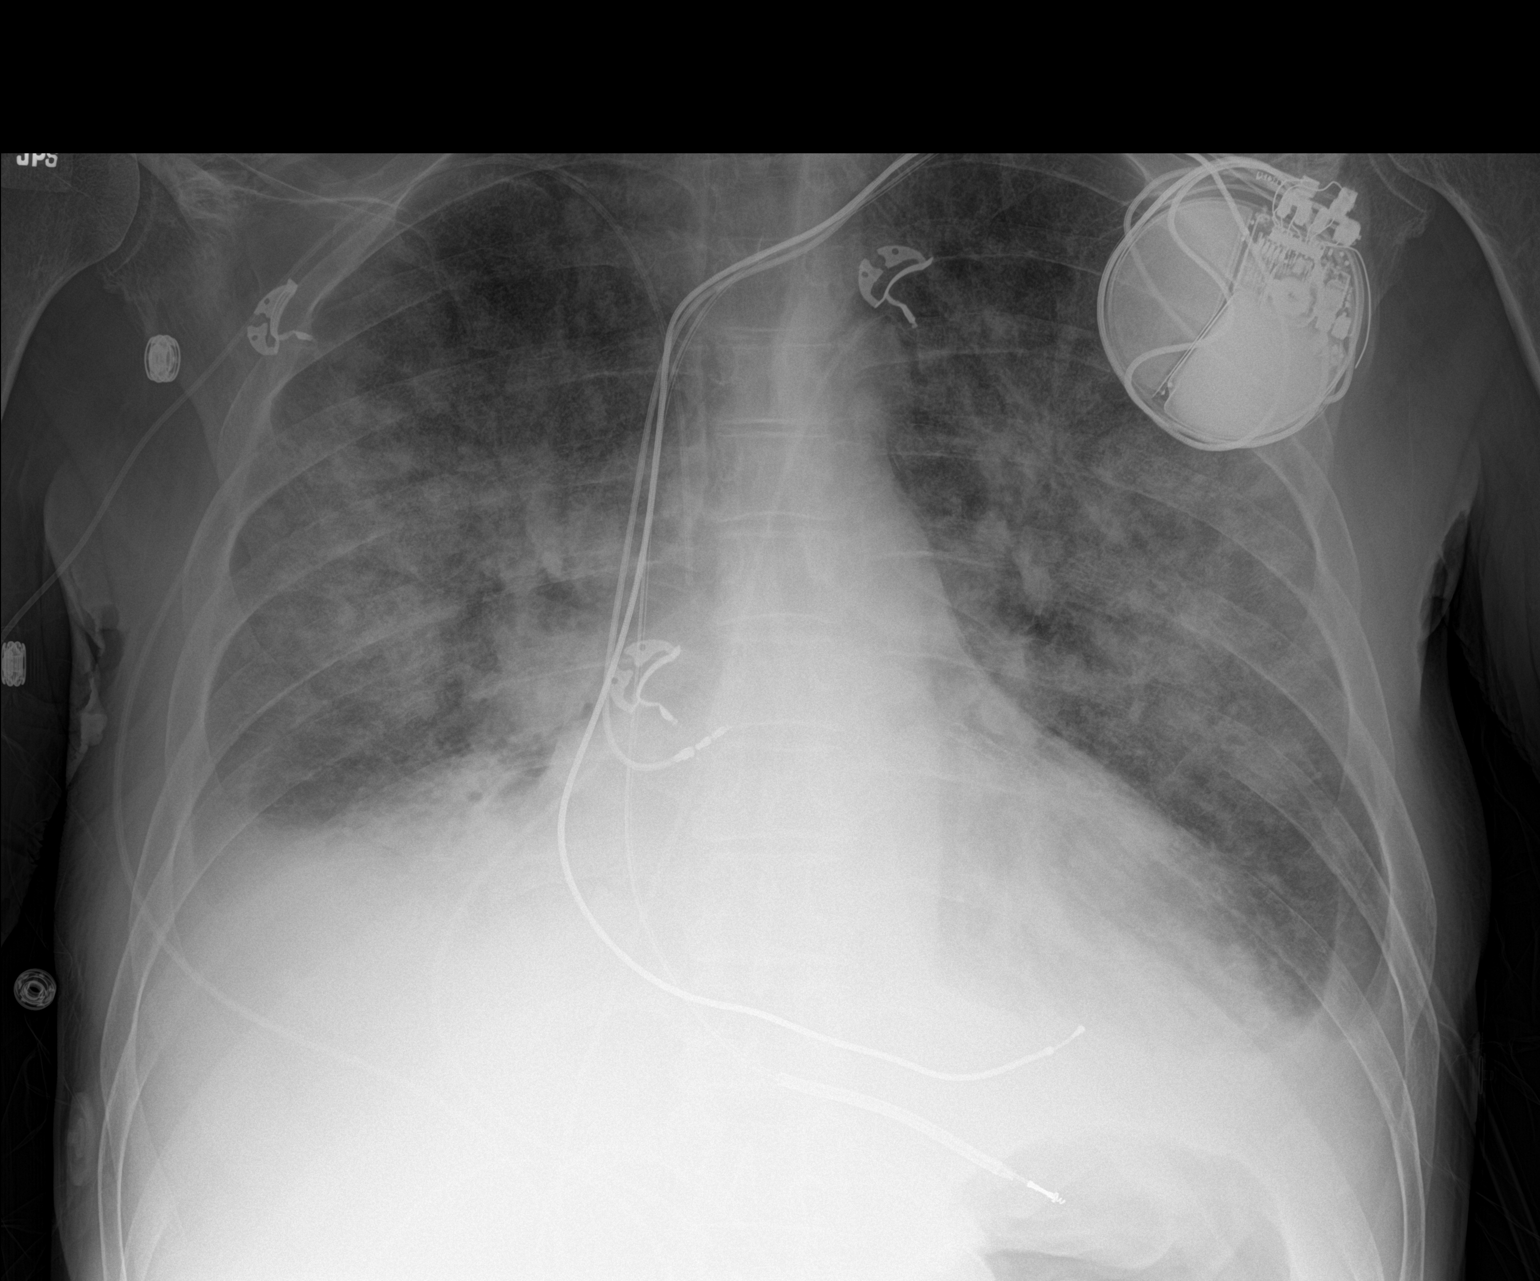

[1 of 1 positions shown; findings below may reference images not displayed]

FINDINGS: Diffuse bilateral airspace disease, worsening on the right since
prior study, stable on the left. There is cardiomegaly. Moderate
layering bilateral effusions. Left AICD remains in place, unchanged.
Right PICC line is stable.
IMPRESSION: Diffuse bilateral airspace disease, increasing on the right since
prior study. Findings could reflect edema or infection.

Moderate layering bilateral effusions.
